# Patient Record
Sex: Female | Born: 1965 | Race: White | Hispanic: Yes | Marital: Married | Smoking: Never smoker
Health system: Southern US, Community
[De-identification: ages and names within clinical notes are randomized; demographics above are authoritative.]

## PROBLEM LIST (undated history)

## (undated) DIAGNOSIS — Z9889 Other specified postprocedural states: Secondary | ICD-10-CM

## (undated) DIAGNOSIS — H269 Unspecified cataract: Secondary | ICD-10-CM

## (undated) DIAGNOSIS — M869 Osteomyelitis, unspecified: Secondary | ICD-10-CM

## (undated) DIAGNOSIS — D649 Anemia, unspecified: Secondary | ICD-10-CM

## (undated) DIAGNOSIS — C52 Malignant neoplasm of vagina: Secondary | ICD-10-CM

## (undated) DIAGNOSIS — E119 Type 2 diabetes mellitus without complications: Secondary | ICD-10-CM

## (undated) DIAGNOSIS — E11621 Type 2 diabetes mellitus with foot ulcer: Secondary | ICD-10-CM

## (undated) DIAGNOSIS — L97509 Non-pressure chronic ulcer of other part of unspecified foot with unspecified severity: Secondary | ICD-10-CM

## (undated) DIAGNOSIS — R112 Nausea with vomiting, unspecified: Secondary | ICD-10-CM

## (undated) DIAGNOSIS — E1165 Type 2 diabetes mellitus with hyperglycemia: Secondary | ICD-10-CM

## (undated) DIAGNOSIS — IMO0002 Reserved for concepts with insufficient information to code with codable children: Secondary | ICD-10-CM

## (undated) HISTORY — DX: Osteomyelitis, unspecified: M86.9

## (undated) HISTORY — DX: Anemia, unspecified: D64.9

## (undated) HISTORY — DX: Type 2 diabetes mellitus with foot ulcer: L97.509

## (undated) HISTORY — DX: Unspecified cataract: H26.9

## (undated) HISTORY — DX: Type 2 diabetes mellitus with foot ulcer: E11.621

## (undated) HISTORY — PX: OTHER SURGICAL HISTORY: SHX169

## (undated) HISTORY — DX: Reserved for concepts with insufficient information to code with codable children: IMO0002

## (undated) HISTORY — PX: TUBAL LIGATION: SHX77

## (undated) HISTORY — DX: Malignant neoplasm of vagina: C52

## (undated) HISTORY — DX: Type 2 diabetes mellitus with hyperglycemia: E11.65

---

## 1998-09-04 ENCOUNTER — Other Ambulatory Visit: Admission: RE | Admit: 1998-09-04 | Discharge: 1998-09-04 | Payer: Self-pay | Admitting: Gynecology

## 2000-04-05 ENCOUNTER — Emergency Department (HOSPITAL_COMMUNITY): Admission: EM | Admit: 2000-04-05 | Discharge: 2000-04-05 | Payer: Self-pay | Admitting: Emergency Medicine

## 2000-07-31 ENCOUNTER — Emergency Department (HOSPITAL_COMMUNITY): Admission: EM | Admit: 2000-07-31 | Discharge: 2000-07-31 | Payer: Self-pay | Admitting: Emergency Medicine

## 2000-08-03 ENCOUNTER — Emergency Department (HOSPITAL_COMMUNITY): Admission: EM | Admit: 2000-08-03 | Discharge: 2000-08-03 | Payer: Self-pay | Admitting: Emergency Medicine

## 2000-08-03 ENCOUNTER — Encounter: Payer: Self-pay | Admitting: Emergency Medicine

## 2000-08-07 ENCOUNTER — Emergency Department (HOSPITAL_COMMUNITY): Admission: EM | Admit: 2000-08-07 | Discharge: 2000-08-07 | Payer: Self-pay | Admitting: Emergency Medicine

## 2000-08-30 ENCOUNTER — Emergency Department (HOSPITAL_COMMUNITY): Admission: EM | Admit: 2000-08-30 | Discharge: 2000-08-30 | Payer: Self-pay | Admitting: Emergency Medicine

## 2002-08-11 ENCOUNTER — Encounter: Payer: Self-pay | Admitting: Family Medicine

## 2002-08-11 ENCOUNTER — Ambulatory Visit (HOSPITAL_COMMUNITY): Admission: RE | Admit: 2002-08-11 | Discharge: 2002-08-11 | Payer: Self-pay | Admitting: Family Medicine

## 2003-01-24 ENCOUNTER — Encounter: Payer: Self-pay | Admitting: Emergency Medicine

## 2003-01-24 ENCOUNTER — Emergency Department (HOSPITAL_COMMUNITY): Admission: EM | Admit: 2003-01-24 | Discharge: 2003-01-24 | Payer: Self-pay | Admitting: Emergency Medicine

## 2003-07-01 HISTORY — PX: OTHER SURGICAL HISTORY: SHX169

## 2003-07-06 ENCOUNTER — Encounter: Payer: Self-pay | Admitting: Obstetrics and Gynecology

## 2003-07-06 ENCOUNTER — Ambulatory Visit (HOSPITAL_COMMUNITY): Admission: RE | Admit: 2003-07-06 | Discharge: 2003-07-06 | Payer: Self-pay | Admitting: Obstetrics and Gynecology

## 2003-07-19 ENCOUNTER — Ambulatory Visit (HOSPITAL_COMMUNITY): Admission: RE | Admit: 2003-07-19 | Discharge: 2003-07-19 | Payer: Self-pay | Admitting: Obstetrics and Gynecology

## 2003-07-19 ENCOUNTER — Encounter (INDEPENDENT_AMBULATORY_CARE_PROVIDER_SITE_OTHER): Payer: Self-pay | Admitting: *Deleted

## 2003-09-25 ENCOUNTER — Ambulatory Visit: Admission: RE | Admit: 2003-09-25 | Discharge: 2003-09-25 | Payer: Self-pay | Admitting: Gynecology

## 2003-10-01 HISTORY — PX: OTHER SURGICAL HISTORY: SHX169

## 2003-10-02 ENCOUNTER — Ambulatory Visit (HOSPITAL_COMMUNITY): Admission: RE | Admit: 2003-10-02 | Discharge: 2003-10-02 | Payer: Self-pay | Admitting: Gynecology

## 2003-10-02 ENCOUNTER — Encounter (INDEPENDENT_AMBULATORY_CARE_PROVIDER_SITE_OTHER): Payer: Self-pay

## 2003-11-13 ENCOUNTER — Ambulatory Visit: Admission: RE | Admit: 2003-11-13 | Discharge: 2003-11-13 | Payer: Self-pay | Admitting: Gynecology

## 2004-02-27 ENCOUNTER — Encounter (INDEPENDENT_AMBULATORY_CARE_PROVIDER_SITE_OTHER): Payer: Self-pay | Admitting: Specialist

## 2004-02-27 ENCOUNTER — Other Ambulatory Visit: Admission: RE | Admit: 2004-02-27 | Discharge: 2004-02-27 | Payer: Self-pay | Admitting: Gynecology

## 2004-02-27 ENCOUNTER — Ambulatory Visit: Admission: RE | Admit: 2004-02-27 | Discharge: 2004-02-27 | Payer: Self-pay | Admitting: Gynecology

## 2004-08-19 ENCOUNTER — Ambulatory Visit: Payer: Self-pay | Admitting: Family Medicine

## 2004-09-23 ENCOUNTER — Ambulatory Visit: Admission: RE | Admit: 2004-09-23 | Discharge: 2004-09-23 | Payer: Self-pay | Admitting: Gynecology

## 2004-11-06 ENCOUNTER — Ambulatory Visit: Payer: Self-pay | Admitting: Family Medicine

## 2004-11-28 ENCOUNTER — Ambulatory Visit: Payer: Self-pay | Admitting: *Deleted

## 2005-01-05 ENCOUNTER — Ambulatory Visit: Payer: Self-pay | Admitting: Family Medicine

## 2005-02-02 ENCOUNTER — Ambulatory Visit: Payer: Self-pay | Admitting: Family Medicine

## 2005-04-07 ENCOUNTER — Ambulatory Visit: Payer: Self-pay | Admitting: Family Medicine

## 2005-04-09 ENCOUNTER — Ambulatory Visit: Payer: Self-pay | Admitting: Family Medicine

## 2005-07-27 ENCOUNTER — Ambulatory Visit: Payer: Self-pay | Admitting: Family Medicine

## 2005-08-04 ENCOUNTER — Ambulatory Visit: Payer: Self-pay | Admitting: Family Medicine

## 2006-06-28 ENCOUNTER — Ambulatory Visit: Payer: Self-pay | Admitting: Internal Medicine

## 2008-06-07 ENCOUNTER — Encounter (INDEPENDENT_AMBULATORY_CARE_PROVIDER_SITE_OTHER): Payer: Self-pay | Admitting: Family Medicine

## 2009-03-24 ENCOUNTER — Emergency Department (HOSPITAL_COMMUNITY): Admission: EM | Admit: 2009-03-24 | Discharge: 2009-03-25 | Payer: Self-pay | Admitting: Emergency Medicine

## 2009-12-31 DIAGNOSIS — C52 Malignant neoplasm of vagina: Secondary | ICD-10-CM

## 2010-01-27 ENCOUNTER — Encounter (INDEPENDENT_AMBULATORY_CARE_PROVIDER_SITE_OTHER): Payer: Self-pay | Admitting: Internal Medicine

## 2010-01-27 ENCOUNTER — Inpatient Hospital Stay (HOSPITAL_COMMUNITY): Admission: EM | Admit: 2010-01-27 | Discharge: 2010-02-01 | Payer: Self-pay | Admitting: Emergency Medicine

## 2010-01-30 ENCOUNTER — Encounter: Payer: Self-pay | Admitting: Physician Assistant

## 2010-01-30 LAB — CONVERTED CEMR LAB: Cholesterol: 102 mg/dL

## 2010-02-03 ENCOUNTER — Ambulatory Visit: Admission: RE | Admit: 2010-02-03 | Discharge: 2010-02-24 | Payer: Self-pay | Admitting: Radiation Oncology

## 2010-02-10 ENCOUNTER — Telehealth: Payer: Self-pay | Admitting: Physician Assistant

## 2010-02-18 ENCOUNTER — Encounter: Payer: Self-pay | Admitting: Physician Assistant

## 2010-02-18 DIAGNOSIS — E119 Type 2 diabetes mellitus without complications: Secondary | ICD-10-CM

## 2010-02-18 DIAGNOSIS — D649 Anemia, unspecified: Secondary | ICD-10-CM

## 2010-02-25 ENCOUNTER — Ambulatory Visit: Payer: Self-pay | Admitting: Oncology

## 2010-03-11 ENCOUNTER — Ambulatory Visit (HOSPITAL_COMMUNITY): Admission: RE | Admit: 2010-03-11 | Discharge: 2010-03-11 | Payer: Self-pay | Admitting: Radiation Oncology

## 2010-03-18 ENCOUNTER — Ambulatory Visit (HOSPITAL_COMMUNITY): Admission: RE | Admit: 2010-03-18 | Discharge: 2010-03-18 | Payer: Self-pay | Admitting: Radiation Oncology

## 2010-04-03 ENCOUNTER — Ambulatory Visit: Payer: Self-pay | Admitting: Oncology

## 2010-04-11 LAB — CBC WITH DIFFERENTIAL/PLATELET
Basophils Absolute: 0 10*3/uL (ref 0.0–0.1)
EOS%: 3.9 % (ref 0.0–7.0)
HCT: 38.1 % (ref 34.8–46.6)
HGB: 12.9 g/dL (ref 11.6–15.9)
LYMPH%: 23.8 % (ref 14.0–49.7)
MCH: 29 pg (ref 25.1–34.0)
MCV: 85.9 fL (ref 79.5–101.0)
MONO%: 9.8 % (ref 0.0–14.0)
NEUT%: 62.1 % (ref 38.4–76.8)
Platelets: 351 10*3/uL (ref 145–400)
RDW: 19.5 % — ABNORMAL HIGH (ref 11.2–14.5)

## 2010-04-11 LAB — COMPREHENSIVE METABOLIC PANEL
AST: 19 U/L (ref 0–37)
Alkaline Phosphatase: 122 U/L — ABNORMAL HIGH (ref 39–117)
BUN: 17 mg/dL (ref 6–23)
Creatinine, Ser: 0.48 mg/dL (ref 0.40–1.20)
Glucose, Bld: 211 mg/dL — ABNORMAL HIGH (ref 70–99)
Total Bilirubin: 0.4 mg/dL (ref 0.3–1.2)

## 2010-05-07 ENCOUNTER — Other Ambulatory Visit: Admission: RE | Admit: 2010-05-07 | Discharge: 2010-05-07 | Payer: Self-pay | Admitting: Gynecologic Oncology

## 2010-05-07 ENCOUNTER — Ambulatory Visit: Admission: RE | Admit: 2010-05-07 | Discharge: 2010-05-07 | Payer: Self-pay | Admitting: Gynecologic Oncology

## 2010-05-16 ENCOUNTER — Ambulatory Visit: Payer: Self-pay | Admitting: Oncology

## 2010-05-19 ENCOUNTER — Ambulatory Visit: Admission: RE | Admit: 2010-05-19 | Discharge: 2010-06-04 | Payer: Self-pay | Admitting: Radiation Oncology

## 2010-05-19 ENCOUNTER — Encounter: Admission: RE | Admit: 2010-05-19 | Discharge: 2010-05-19 | Payer: Self-pay | Admitting: Emergency Medicine

## 2010-05-20 LAB — CBC WITH DIFFERENTIAL/PLATELET
Basophils Absolute: 0 10*3/uL (ref 0.0–0.1)
EOS%: 3.6 % (ref 0.0–7.0)
HGB: 13 g/dL (ref 11.6–15.9)
MCH: 30.3 pg (ref 25.1–34.0)
MCV: 87.7 fL (ref 79.5–101.0)
MONO%: 7.9 % (ref 0.0–14.0)
RBC: 4.29 10*6/uL (ref 3.70–5.45)
RDW: 13.7 % (ref 11.2–14.5)

## 2010-05-20 LAB — COMPREHENSIVE METABOLIC PANEL
AST: 15 U/L (ref 0–37)
Albumin: 3.8 g/dL (ref 3.5–5.2)
Alkaline Phosphatase: 141 U/L — ABNORMAL HIGH (ref 39–117)
BUN: 15 mg/dL (ref 6–23)
Potassium: 4 mEq/L (ref 3.5–5.3)
Total Bilirubin: 0.8 mg/dL (ref 0.3–1.2)

## 2010-05-30 LAB — COMPREHENSIVE METABOLIC PANEL
ALT: 17 U/L (ref 0–35)
Alkaline Phosphatase: 150 U/L — ABNORMAL HIGH (ref 39–117)
CO2: 25 mEq/L (ref 19–32)
Creatinine, Ser: 0.44 mg/dL (ref 0.40–1.20)
Total Bilirubin: 0.9 mg/dL (ref 0.3–1.2)

## 2010-05-30 LAB — CBC WITH DIFFERENTIAL/PLATELET
Eosinophils Absolute: 0.2 10*3/uL (ref 0.0–0.5)
HCT: 38.6 % (ref 34.8–46.6)
LYMPH%: 19.9 % (ref 14.0–49.7)
MCHC: 34.7 g/dL (ref 31.5–36.0)
MONO#: 0.4 10*3/uL (ref 0.1–0.9)
NEUT#: 3.7 10*3/uL (ref 1.5–6.5)
NEUT%: 69.5 % (ref 38.4–76.8)
Platelets: 259 10*3/uL (ref 145–400)
WBC: 5.3 10*3/uL (ref 3.9–10.3)

## 2010-06-06 LAB — CBC WITH DIFFERENTIAL/PLATELET
BASO%: 1 % (ref 0.0–2.0)
EOS%: 3.1 % (ref 0.0–7.0)
HCT: 39.4 % (ref 34.8–46.6)
LYMPH%: 22 % (ref 14.0–49.7)
MCH: 29.6 pg (ref 25.1–34.0)
MCHC: 34 g/dL (ref 31.5–36.0)
MCV: 87 fL (ref 79.5–101.0)
NEUT%: 66.3 % (ref 38.4–76.8)
Platelets: 240 10*3/uL (ref 145–400)

## 2010-07-02 ENCOUNTER — Ambulatory Visit: Payer: Self-pay | Admitting: Oncology

## 2010-07-04 LAB — COMPREHENSIVE METABOLIC PANEL
ALT: 14 U/L (ref 0–35)
AST: 13 U/L (ref 0–37)
Calcium: 9.8 mg/dL (ref 8.4–10.5)
Chloride: 99 mEq/L (ref 96–112)
Creatinine, Ser: 0.51 mg/dL (ref 0.40–1.20)
Potassium: 4 mEq/L (ref 3.5–5.3)
Sodium: 138 mEq/L (ref 135–145)
Total Protein: 7 g/dL (ref 6.0–8.3)

## 2010-07-04 LAB — CBC WITH DIFFERENTIAL/PLATELET
BASO%: 0.3 % (ref 0.0–2.0)
EOS%: 2 % (ref 0.0–7.0)
MCH: 31.4 pg (ref 25.1–34.0)
MCHC: 34.7 g/dL (ref 31.5–36.0)
NEUT%: 65.8 % (ref 38.4–76.8)
RBC: 4.02 10*6/uL (ref 3.70–5.45)
RDW: 13.2 % (ref 11.2–14.5)
WBC: 4.6 10*3/uL (ref 3.9–10.3)
lymph#: 1.1 10*3/uL (ref 0.9–3.3)

## 2010-07-21 ENCOUNTER — Encounter: Payer: Self-pay | Admitting: Physician Assistant

## 2010-08-07 ENCOUNTER — Ambulatory Visit: Admission: RE | Admit: 2010-08-07 | Discharge: 2010-08-07 | Payer: Self-pay | Admitting: Gynecologic Oncology

## 2010-10-01 ENCOUNTER — Ambulatory Visit: Payer: Self-pay | Admitting: Oncology

## 2010-10-03 LAB — CBC WITH DIFFERENTIAL/PLATELET
Basophils Absolute: 0 10*3/uL (ref 0.0–0.1)
EOS%: 3 % (ref 0.0–7.0)
Eosinophils Absolute: 0.1 10*3/uL (ref 0.0–0.5)
HCT: 38.4 % (ref 34.8–46.6)
HGB: 13.3 g/dL (ref 11.6–15.9)
MCH: 30.9 pg (ref 25.1–34.0)
MCV: 89.5 fL (ref 79.5–101.0)
MONO%: 6.3 % (ref 0.0–14.0)
NEUT#: 2.7 10*3/uL (ref 1.5–6.5)
NEUT%: 62.4 % (ref 38.4–76.8)
lymph#: 1.2 10*3/uL (ref 0.9–3.3)

## 2010-10-03 LAB — COMPREHENSIVE METABOLIC PANEL
AST: 12 U/L (ref 0–37)
Albumin: 4.6 g/dL (ref 3.5–5.2)
BUN: 14 mg/dL (ref 6–23)
Calcium: 10.4 mg/dL (ref 8.4–10.5)
Chloride: 94 mEq/L — ABNORMAL LOW (ref 96–112)
Creatinine, Ser: 0.62 mg/dL (ref 0.40–1.20)
Glucose, Bld: 421 mg/dL — ABNORMAL HIGH (ref 70–99)
Potassium: 4.3 mEq/L (ref 3.5–5.3)

## 2010-10-30 ENCOUNTER — Ambulatory Visit: Admission: RE | Admit: 2010-10-30 | Payer: Self-pay | Admitting: Gynecologic Oncology

## 2010-11-13 ENCOUNTER — Ambulatory Visit
Admission: RE | Admit: 2010-11-13 | Discharge: 2010-11-13 | Payer: Self-pay | Source: Home / Self Care | Attending: Gynecologic Oncology | Admitting: Gynecologic Oncology

## 2010-11-13 ENCOUNTER — Other Ambulatory Visit
Admission: RE | Admit: 2010-11-13 | Discharge: 2010-11-13 | Payer: Self-pay | Source: Home / Self Care | Admitting: Gynecologic Oncology

## 2010-12-14 ENCOUNTER — Inpatient Hospital Stay (HOSPITAL_COMMUNITY)
Admission: EM | Admit: 2010-12-14 | Discharge: 2010-12-19 | Payer: Self-pay | Source: Home / Self Care | Attending: Internal Medicine | Admitting: Internal Medicine

## 2010-12-15 ENCOUNTER — Encounter (INDEPENDENT_AMBULATORY_CARE_PROVIDER_SITE_OTHER): Payer: Self-pay | Admitting: Internal Medicine

## 2010-12-15 LAB — PREGNANCY, URINE: Preg Test, Ur: NEGATIVE

## 2010-12-15 LAB — POCT I-STAT, CHEM 8
BUN: 5 mg/dL — ABNORMAL LOW (ref 6–23)
Calcium, Ion: 1.21 mmol/L (ref 1.12–1.32)
Chloride: 105 mEq/L (ref 96–112)
Creatinine, Ser: 0.5 mg/dL (ref 0.4–1.2)
Glucose, Bld: 310 mg/dL — ABNORMAL HIGH (ref 70–99)
HCT: 39 % (ref 36.0–46.0)
Hemoglobin: 13.3 g/dL (ref 12.0–15.0)
Potassium: 3.4 mEq/L — ABNORMAL LOW (ref 3.5–5.1)
Sodium: 135 mEq/L (ref 135–145)
TCO2: 13 mmol/L (ref 0–100)

## 2010-12-15 LAB — COMPREHENSIVE METABOLIC PANEL
ALT: 10 U/L (ref 0–35)
AST: 10 U/L (ref 0–37)
Albumin: 2.4 g/dL — ABNORMAL LOW (ref 3.5–5.2)
Alkaline Phosphatase: 110 U/L (ref 39–117)
BUN: 6 mg/dL (ref 6–23)
CO2: 19 mEq/L (ref 19–32)
Calcium: 8.4 mg/dL (ref 8.4–10.5)
Chloride: 104 mEq/L (ref 96–112)
Creatinine, Ser: 0.58 mg/dL (ref 0.4–1.2)
GFR calc Af Amer: >60 mL/min (ref >60)
GFR calc non Af Amer: >60 mL/min (ref >60)
Glucose, Bld: 235 mg/dL — ABNORMAL HIGH (ref 70–99)
Potassium: 3.3 mEq/L — ABNORMAL LOW (ref 3.5–5.1)
Sodium: 135 mEq/L (ref 135–145)
Total Bilirubin: 0.9 mg/dL (ref 0.3–1.2)
Total Protein: 7.1 g/dL (ref 6.0–8.3)

## 2010-12-15 LAB — CBC
HCT: 35.4 % — ABNORMAL LOW (ref 36.0–46.0)
Hemoglobin: 12 g/dL (ref 12.0–15.0)
MCH: 30.1 pg (ref 26.0–34.0)
MCHC: 33.9 g/dL (ref 30.0–36.0)
MCV: 88.7 fL (ref 78.0–100.0)
Platelets: 513 10*3/uL — ABNORMAL HIGH (ref 150–400)
RBC: 3.99 MIL/uL (ref 3.87–5.11)
RDW: 12.7 % (ref 11.5–15.5)
WBC: 15.5 10*3/uL — ABNORMAL HIGH (ref 4.0–10.5)

## 2010-12-15 LAB — GLUCOSE, CAPILLARY
Glucose-Capillary: 291 mg/dL — ABNORMAL HIGH (ref 70–99)
Glucose-Capillary: 240 mg/dL — ABNORMAL HIGH (ref 70–99)
Glucose-Capillary: 231 mg/dL — ABNORMAL HIGH (ref 70–99)
Glucose-Capillary: 327 mg/dL — ABNORMAL HIGH (ref 70–99)
Glucose-Capillary: 246 mg/dL — ABNORMAL HIGH (ref 70–99)
Glucose-Capillary: 325 mg/dL — ABNORMAL HIGH (ref 70–99)

## 2010-12-15 LAB — DIFFERENTIAL
Basophils Absolute: 0 10*3/uL (ref 0.0–0.1)
Basophils Relative: 0 % (ref 0–1)
Eosinophils Absolute: 0 10*3/uL (ref 0.0–0.7)
Eosinophils Relative: 0 % (ref 0–5)
Lymphocytes Relative: 9 % — ABNORMAL LOW (ref 12–46)
Lymphs Abs: 1.4 10*3/uL (ref 0.7–4.0)
Monocytes Absolute: 0.7 10*3/uL (ref 0.1–1.0)
Monocytes Relative: 5 % (ref 3–12)
Neutro Abs: 13.3 10*3/uL — ABNORMAL HIGH (ref 1.7–7.7)
Neutrophils Relative %: 86 % — ABNORMAL HIGH (ref 43–77)

## 2010-12-15 LAB — HEMOGLOBIN A1C
Hgb A1c MFr Bld: 13.2 % — ABNORMAL HIGH (ref <5.7)
Mean Plasma Glucose: 332 mg/dL — ABNORMAL HIGH (ref <117)

## 2010-12-15 LAB — URINE MICROSCOPIC-ADD ON

## 2010-12-15 LAB — URINALYSIS, ROUTINE W REFLEX MICROSCOPIC
Bilirubin Urine: NEGATIVE
Ketones, ur: >80 mg/dL — AB
Nitrite: NEGATIVE
Protein, ur: 100 mg/dL — AB
Specific Gravity, Urine: 1.022 (ref 1.005–1.030)
Urine Glucose, Fasting: >1000 mg/dL — AB
Urobilinogen, UA: 0.2 mg/dL (ref 0.0–1.0)
pH: 5.5 (ref 5.0–8.0)

## 2010-12-15 LAB — C-REACTIVE PROTEIN: CRP: 23.8 mg/dL — ABNORMAL HIGH (ref <0.6)

## 2010-12-15 LAB — SEDIMENTATION RATE: Sed Rate: 116 mm/hr — ABNORMAL HIGH (ref 0–22)

## 2010-12-17 ENCOUNTER — Encounter (INDEPENDENT_AMBULATORY_CARE_PROVIDER_SITE_OTHER): Payer: Self-pay | Admitting: Internal Medicine

## 2010-12-17 LAB — GLUCOSE, CAPILLARY
Glucose-Capillary: 262 mg/dL — ABNORMAL HIGH (ref 70–99)
Glucose-Capillary: 157 mg/dL — ABNORMAL HIGH (ref 70–99)
Glucose-Capillary: 259 mg/dL — ABNORMAL HIGH (ref 70–99)
Glucose-Capillary: 237 mg/dL — ABNORMAL HIGH (ref 70–99)
Glucose-Capillary: 303 mg/dL — ABNORMAL HIGH (ref 70–99)
Glucose-Capillary: 162 mg/dL — ABNORMAL HIGH (ref 70–99)
Glucose-Capillary: 149 mg/dL — ABNORMAL HIGH (ref 70–99)
Glucose-Capillary: 130 mg/dL — ABNORMAL HIGH (ref 70–99)

## 2010-12-17 LAB — CBC
HCT: 27.1 % — ABNORMAL LOW (ref 36.0–46.0)
HCT: 27.9 % — ABNORMAL LOW (ref 36.0–46.0)
Hemoglobin: 9.2 g/dL — ABNORMAL LOW (ref 12.0–15.0)
Hemoglobin: 9.4 g/dL — ABNORMAL LOW (ref 12.0–15.0)
MCH: 29.6 pg (ref 26.0–34.0)
MCH: 29.5 pg (ref 26.0–34.0)
MCHC: 33.9 g/dL (ref 30.0–36.0)
MCHC: 33.7 g/dL (ref 30.0–36.0)
MCV: 87.1 fL (ref 78.0–100.0)
MCV: 87.5 fL (ref 78.0–100.0)
Platelets: 401 10*3/uL — ABNORMAL HIGH (ref 150–400)
Platelets: 446 10*3/uL — ABNORMAL HIGH (ref 150–400)
RBC: 3.11 MIL/uL — ABNORMAL LOW (ref 3.87–5.11)
RBC: 3.19 MIL/uL — ABNORMAL LOW (ref 3.87–5.11)
RDW: 12.9 % (ref 11.5–15.5)
RDW: 12.9 % (ref 11.5–15.5)
WBC: 9.2 10*3/uL (ref 4.0–10.5)
WBC: 9 10*3/uL (ref 4.0–10.5)

## 2010-12-17 LAB — BASIC METABOLIC PANEL
BUN: 4 mg/dL — ABNORMAL LOW (ref 6–23)
CO2: 22 mEq/L (ref 19–32)
Calcium: 8.4 mg/dL (ref 8.4–10.5)
Chloride: 108 mEq/L (ref 96–112)
Creatinine, Ser: 0.62 mg/dL (ref 0.4–1.2)
GFR calc Af Amer: >60 mL/min (ref >60)
GFR calc non Af Amer: >60 mL/min (ref >60)
Glucose, Bld: 254 mg/dL — ABNORMAL HIGH (ref 70–99)
Potassium: 3.9 mEq/L (ref 3.5–5.1)
Sodium: 138 mEq/L (ref 135–145)

## 2010-12-17 LAB — DIFFERENTIAL
Basophils Absolute: 0 10*3/uL (ref 0.0–0.1)
Basophils Relative: 0 % (ref 0–1)
Eosinophils Absolute: 0.1 10*3/uL (ref 0.0–0.7)
Eosinophils Relative: 1 % (ref 0–5)
Lymphocytes Relative: 12 % (ref 12–46)
Lymphs Abs: 1.1 10*3/uL (ref 0.7–4.0)
Monocytes Absolute: 0.7 10*3/uL (ref 0.1–1.0)
Monocytes Relative: 8 % (ref 3–12)
Neutro Abs: 7.3 10*3/uL (ref 1.7–7.7)
Neutrophils Relative %: 79 % — ABNORMAL HIGH (ref 43–77)

## 2010-12-17 LAB — URINE CULTURE
Colony Count: 15000
Culture  Setup Time: 201201151146
Special Requests: NEGATIVE

## 2010-12-17 LAB — VANCOMYCIN, TROUGH: Vancomycin Tr: 5.5 ug/mL — ABNORMAL LOW (ref 10.0–20.0)

## 2010-12-17 LAB — TSH: TSH: 0.654 u[IU]/mL (ref 0.350–4.500)

## 2010-12-18 ENCOUNTER — Other Ambulatory Visit: Payer: Self-pay | Admitting: Radiation Oncology

## 2010-12-18 DIAGNOSIS — C52 Malignant neoplasm of vagina: Secondary | ICD-10-CM

## 2010-12-19 HISTORY — PX: OTHER SURGICAL HISTORY: SHX169

## 2010-12-19 NOTE — Op Note (Signed)
[name], [name]                 ACCOUNT NO.:  [id number]  MEDICAL RECORD NO.:  [id number]          PATIENT TYPE:  INP  LOCATION:  1535                         FACILITY:  [facility]  PHYSICIAN:  Feliberto Gottron. Turner Daniels, M.D.   DATE OF BIRTH:  June 17, 1966  DATE OF PROCEDURE:  12/17/2010 DATE OF DISCHARGE:                              OPERATIVE REPORT   PREOPERATIVE DIAGNOSIS:  Left second toe diabetic infection and necrosis.  POSTOPERATIVE DIAGNOSIS:  Left second toe diabetic infection and necrosis.  PROCEDURE:  Left second toe MTP joint amputation.  SURGEON:  Feliberto Gottron. Turner Daniels, MD  FIRST ASSISTANT:  Shirl Harris, PA  ANESTHETIC:  General LMA.  ESTIMATED BLOOD LOSS:  20 mL.  FLUID REPLACEMENT:  800 mL of crystalloid.  DRAINS PLACED:  None.  TOURNIQUET TIME:  None.  INDICATIONS FOR PROCEDURE:  The patient is a 45 year old woman admitted to the Medicine Service with cellulitis of both feet and early diabetic ketoacidosis.  A few days before this procedure, we were consulted because of infection and necrosis of the left second toe.  MRI scan was consistent with osteomyelitis of the proximal middle phalanges.  She also had skin necrosis and exposed tendon after a debridement at the bedside 2 days after her admission.  This was pretty much demarcated out to the level of the MTP joint and was consistent with the MRI scan. After her diabetes was brought under control and her white count normalized, she was prepared for left second toe MTP joint amputation. The toe itself was black at the tip and obviously necrotic.  Risks and benefits of surgery were discussed, and questions were answered.  DESCRIPTION OF THE PROCEDURE:  The patient identified by armband and taken to the operating room at [facility]. Appropriate anesthetic monitors were attached.  General LMA anesthesia induced with the patient in supine position.  Tourniquet was applied to the left ankle, but  never used.  The left foot was then prepped and draped in usual sterile fashion from the toes to the tourniquet.  I began the operation by making a fishmouth incision allowing the MTP joint amputation of the second toe.  It was not until we got down fairly deep, encountered any significant bleeding, and these were cauterized with the cautery.  We then cut the ligaments around the MTP joint performing the MTP joint amputation.  The second toe was sent to the lab as a specimen.  We did find some bits of necrotic material around to the metatarsal head.  These were removed sharply with the knife and rongeurs back to a healthy-appearing tissue.  We then irrigated out alternating between normal saline solution and hydrogen peroxide and satisfied with the debridement, loosely closed the fishmouth incision with a couple of 3-0 nylon horizontal mattress sutures.  We also placed an Iodoform wick in the wound as a part of the drain.  A dressing of 4 x 4's, Kerlix, and an Ace wrap was then applied.  The patient was awakened and taken to the recovery room without difficulty.     Feliberto Gottron. Turner Daniels, M.D.  FJR/MEDQ  D:  12/18/2010  T:  12/19/2010  Job:  [id number]  Electronically Signed by Gean Birchwood M.D. on 12/19/2010 10:47:18 PM

## 2010-12-20 NOTE — H&P (Signed)
[name], [name]                 ACCOUNT NO.:  [id number]  MEDICAL RECORD NO.:  [id number]          PATIENT TYPE:  INP  LOCATION:  0101                         FACILITY:  [facility]  PHYSICIAN:  Hilary Hertz, MD      DATE OF BIRTH:  1966/11/24  DATE OF ADMISSION:  12/14/2010 DATE OF DISCHARGE:                             HISTORY & PHYSICAL   PRIMARY CARE PHYSICIAN:  None.  CHIEF COMPLAINT:  Left foot pain.  HISTORY OF PRESENT ILLNESS:   ALL HISTORY OBTAINED VIA SPANISH TRANSLATOR  The patient is a 46 year old woman with untreated diabetes, vulvar cancer, and squamous cell carcinoma of the cervix, stage 4 vaginal cancer who presents with a 1-week history of left foot pain.  The patient reports that for about a week she has had pain in her left foot and some chills and nausea.  She presents today for evaluation of the symptoms.  The patient has also noted a wound on her left toe that has been progressively getting worse over the past week.  She denies any recent injury to the foot.  She reports that 2 years ago she did have a foot injury, but nothing recent.  She has no other acute complaints.    Review of 10 organ systems was done and is negative except as stated above in the HPI  ALLERGIES:  No known drug allergies.  MEDICATIONS:  The patient reports she was taking amoxicillin as needed that she brought at the pharmacy.  She was not prescribed this medication.  She is not on any medications for diabetes due to cost concerns.  PAST MEDICAL HISTORY: 1. Vulvar cancer is 2005. 2. Stage 4 vaginal cancer. 3. 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 4. Anemia. 5. Diabetes, currently not on any medications due to cost.  SOCIAL HISTORY:  Nonsmoker.  No alcohol.  FAMILY HISTORY:  No family history of CAD or diabetes.  PHYSICAL EXAM:  VITALS:  A 104/69, 115, 18, 98.6, and 96% on room air. GENERAL:  No acute distress. HEENT:  Mucous  membranes moist.  Sclerae anicteric. CV:  Tachycardic.  No murmurs. LUNGS:  Clear to auscultation bilaterally.  Nonlabored respiratory effort. ABDOMEN:  Soft, nontender, and nondistended. EXTREMITIES:  Left lower extremity shows diffuse erythema and warmth of her left foot extending up to the knee with an ulceration on the anterior portion of her left second toe with scaling skin and necrotic skin.  The patient has good sensation to the area.  The foot is painful to touch.  In the right foot second toe, there is also some skin breakdown and redness, but it is much less so than on the left side. There is no pitting edema.  LABORATORY DATA:  White count 15.5, hemoglobin 12, and platelets 513. Sodium 135, potassium 3.4, chloride 105, bicarbonate 13, BUN 5, creatinine 0.5, and glucose 310.  UA; positive blood, negative nitrites, positive leukocyte esterase, positive glucose, 7 to 10 WBCs, rare bacteria, and 11 to 20 RBCs.  Foot x-rays bilaterally showed no signs of osteomyelitis.  ASSESSMENT AND  PLAN:  The patient is a 45 year old woman with history of untreated diabetes, history of vulvar cancer, and stage 4 vaginal cancer who presents with left foot pain and ulceration. 1. Cellulitis and ulcerations bilateral feet.  At this time this is     concerning for more than just a typical cellulitis given the skin     changes.  Although the plain films were negative for osteomyelitis,     this is not the diagnostic test for osteomyelitis.  We will check a     sed rate and CRP and if these are elevated we will consider getting     an MRI of the patient's feet bilaterally.  She got Levaquin in the     ED.  We will put the patient on vancomycin and Zosyn, pharmacy to     dose.  We will also contact surgery in the morning as it is     unlikely that these ulcerations will be treated just with     antibiotics.  We will check blood cultures to rule out systemic     infections.  Will get wound care  consult.  Consider ID consult. 2. Dirty UA concerning for infection.  We will check a urine culture.     The patient will be on vancomycin and Zosyn as above. 3. Diabetes.  This is currently untreated due to the patient's     inability to afford medications.  We will check a hemoglobin A1c.     We will put the patient on sliding scale insulin.  We will ask     diabetes educator and talk to the patient. 4. Vulvar and vaginal cancers in remission per recent documentation. 5. Fluids, electrolytes, and nutrition.  We will place the patient on     maintenance IV fluids, replace electrolytes, and put her on a     diabetic diet. 6. Prophylaxis.  Lovenox for DVT prophylaxis. 7. Microscopic hematuria.  We will need to monitor and consider     outpatient workup given the fact on the patient's PET scan from     April 2011 showed circumferential bladder wall thickening.  DISPOSITION:  The patient is a full code.  She will be admitted for further workup.          ______________________________ Hilary Hertz, MD     JF/MEDQ  D:  12/14/2010  T:  12/14/2010  Job:  [id number]  Electronically Signed by Hilary Hertz MD on 12/20/2010 07:43:10 PM

## 2010-12-21 ENCOUNTER — Encounter: Payer: Self-pay | Admitting: Internal Medicine

## 2010-12-22 LAB — GLUCOSE, CAPILLARY
Glucose-Capillary: 130 mg/dL — ABNORMAL HIGH (ref 70–99)
Glucose-Capillary: 162 mg/dL — ABNORMAL HIGH (ref 70–99)
Glucose-Capillary: 149 mg/dL — ABNORMAL HIGH (ref 70–99)
Glucose-Capillary: 235 mg/dL — ABNORMAL HIGH (ref 70–99)
Glucose-Capillary: 171 mg/dL — ABNORMAL HIGH (ref 70–99)
Glucose-Capillary: 106 mg/dL — ABNORMAL HIGH (ref 70–99)

## 2010-12-22 LAB — CBC
HCT: 27.1 % — ABNORMAL LOW (ref 36.0–46.0)
Hemoglobin: 9.1 g/dL — ABNORMAL LOW (ref 12.0–15.0)
Hemoglobin: 9 g/dL — ABNORMAL LOW (ref 12.0–15.0)
MCH: 29.5 pg (ref 26.0–34.0)
MCH: 29.7 pg (ref 26.0–34.0)
MCHC: 33.6 g/dL (ref 30.0–36.0)
MCHC: 33 g/dL (ref 30.0–36.0)
MCV: 88 fL (ref 78.0–100.0)
MCV: 90.1 fL (ref 78.0–100.0)
MCV: 89.1 fL (ref 78.0–100.0)
Platelets: 431 10*3/uL — ABNORMAL HIGH (ref 150–400)
Platelets: 521 10*3/uL — ABNORMAL HIGH (ref 150–400)
RBC: 3.08 MIL/uL — ABNORMAL LOW (ref 3.87–5.11)
RBC: 3.03 MIL/uL — ABNORMAL LOW (ref 3.87–5.11)
RDW: 13.1 % (ref 11.5–15.5)
RDW: 13.4 % (ref 11.5–15.5)
WBC: 8.6 10*3/uL (ref 4.0–10.5)
WBC: 5.8 10*3/uL (ref 4.0–10.5)

## 2010-12-22 LAB — BASIC METABOLIC PANEL
BUN: 2 mg/dL — ABNORMAL LOW (ref 6–23)
BUN: 1 mg/dL — ABNORMAL LOW (ref 6–23)
CO2: 30 mEq/L (ref 19–32)
CO2: 30 mEq/L (ref 19–32)
Calcium: 8.4 mg/dL (ref 8.4–10.5)
Calcium: 8.5 mg/dL (ref 8.4–10.5)
Chloride: 106 mEq/L (ref 96–112)
Chloride: 108 mEq/L (ref 96–112)
Creatinine, Ser: 0.56 mg/dL (ref 0.4–1.2)
Creatinine, Ser: 0.56 mg/dL (ref 0.4–1.2)
GFR calc Af Amer: >60 mL/min (ref >60)
GFR calc Af Amer: >60 mL/min (ref >60)
GFR calc non Af Amer: >60 mL/min (ref >60)
GFR calc non Af Amer: >60 mL/min (ref >60)
Glucose, Bld: 123 mg/dL — ABNORMAL HIGH (ref 70–99)
Glucose, Bld: 209 mg/dL — ABNORMAL HIGH (ref 70–99)
Potassium: 2.7 mEq/L — CL (ref 3.5–5.1)
Potassium: 4.1 mEq/L (ref 3.5–5.1)
Sodium: 144 mEq/L (ref 135–145)
Sodium: 141 mEq/L (ref 135–145)

## 2010-12-22 LAB — DIFFERENTIAL
Basophils Absolute: 0 10*3/uL (ref 0.0–0.1)
Basophils Relative: 0 % (ref 0–1)
Eosinophils Absolute: 0.2 10*3/uL (ref 0.0–0.7)
Eosinophils Absolute: 0.3 10*3/uL (ref 0.0–0.7)
Eosinophils Relative: 2 % (ref 0–5)
Lymphocytes Relative: 19 % (ref 12–46)
Lymphs Abs: 1.6 10*3/uL (ref 0.7–4.0)
Lymphs Abs: 1.8 10*3/uL (ref 0.7–4.0)
Monocytes Absolute: 0.8 10*3/uL (ref 0.1–1.0)
Monocytes Absolute: 0.8 10*3/uL (ref 0.1–1.0)
Monocytes Relative: 10 % (ref 3–12)
Monocytes Relative: 12 % (ref 3–12)
Neutro Abs: 5.9 10*3/uL (ref 1.7–7.7)
Neutro Abs: 4.2 10*3/uL (ref 1.7–7.7)
Neutrophils Relative %: 69 % (ref 43–77)
Neutrophils Relative %: 59 % (ref 43–77)

## 2010-12-22 LAB — IRON AND TIBC
Saturation Ratios: 10 % — ABNORMAL LOW (ref 20–55)
TIBC: 148 ug/dL — ABNORMAL LOW (ref 250–470)
UIBC: 133 ug/dL

## 2010-12-22 LAB — FERRITIN: Ferritin: 348 ng/mL — ABNORMAL HIGH (ref 10–291)

## 2010-12-22 LAB — FOLATE: Folate: 12.3 ng/mL

## 2010-12-22 LAB — POTASSIUM
Potassium: 3.4 mEq/L — ABNORMAL LOW (ref 3.5–5.1)
Potassium: 3.3 mEq/L — ABNORMAL LOW (ref 3.5–5.1)

## 2010-12-22 LAB — SURGICAL PCR SCREEN
MRSA, PCR: NEGATIVE
Staphylococcus aureus: NEGATIVE

## 2010-12-22 LAB — MAGNESIUM: Magnesium: 2.1 mg/dL (ref 1.5–2.5)

## 2010-12-22 LAB — VANCOMYCIN, TROUGH: Vancomycin Tr: 16.8 ug/mL (ref 10.0–20.0)

## 2010-12-22 LAB — VITAMIN B12: Vitamin B-12: 1392 pg/mL — ABNORMAL HIGH (ref 211–911)

## 2010-12-23 LAB — CULTURE, BLOOD (ROUTINE X 2)
Culture  Setup Time: 201201151140
Culture  Setup Time: 201201151140
Culture: NO GROWTH
Culture: NO GROWTH

## 2010-12-26 NOTE — Discharge Summary (Signed)
NAMEGWYNNE, [name]                 ACCOUNT NO.:  [id number]  MEDICAL RECORD NO.:  [id number]          PATIENT TYPE:  INP  LOCATION:  1535                         FACILITY:  [facility]  PHYSICIAN:  Hartley Barefoot, MD    DATE OF BIRTH:  [birth year]  DATE OF ADMISSION:  12/14/2010 DATE OF DISCHARGE:  12/19/2010                              DISCHARGE SUMMARY   DISCHARGE DIAGNOSES: 1. Left second toe diabetic infection and necrosis, status post left     second toe metatarsophalangeal joint amputation. 2. Diabetes, uncontrolled. 3. Anemia of chronic disease.  OTHER PAST MEDICAL HISTORY: 1. History of vulvar cancer in 2005. 2. History of squamous cell carcinoma of the cervix in 2011 and status     post chemo and radiation.  DISCHARGE MEDICATIONS: 1. Bactrim DS 2 tablets by mouth twice daily for 45 days. 2. Ciprofloxacin 500 mg p.o. b.i.d. for 45 days. 3. Hydrocodone/Tylenol 1-2 tablets by mouth every 4 hours as needed     for pain. 4. Lantus 15 units subcutaneous daily at bedtime. 5. Metformin 500 mg 1 tablet by mouth twice daily.  DISPOSITION AND FOLLOWUP:  Mr. [name] will need to follow up with Dr. Turner Townsend on January 26 at 11:30.  She will need also to follow up with Dr. Daiva Townsend on January 26, 2011, at 2 p.m., and at the [facility] on March 3 at 2:30 p.m..  We will need to control her blood sugar.  Wound care was explained to the patient, and she will need to wash the foot with peroxide twice a day, and she will need to follow with her orthopedic doctor.  RADIOGRAPHIC STUDIES: 1. The left foot x-ray showed ulcers and demineralization. 2. On the right, showed no ulcers or demineralization. 3. MRI of the left foot showed cellulitis and myositis of the foot     without abscess.  Findings consistent with osteomyelitis seen in     the head of the second metatarsal and proximal middle phalanx of     the second toe.  Mild degree of signal changes, which could be due     to osteomyelitis  as identified in the proximal phalanx of the third     toe.  HISTORY OF PRESENT ILLNESS:  This is a 45 year old with past medical history of diabetes, uncontrolled, not on medication who presents complaining of a 1-week history of left foot pain.  She relayed that for about a week she has had left foot pain and had some chills and nausea. She also has another wound on her left second toe that has been progressively getting worse over the past week.  HOSPITAL COURSE: 1. Cellulitis of the left foot and left second toe diabetic infection     and necrosis status post amputation.  The patient was admitted to     regular floor.  Orthopedics was consulted.  The patient had     amputation of the second toe MTP joint on December 17, 2010.  ID was     consulted, and Dr. Daiva Townsend recommended initially vancomycin IV     inpatient with ciprofloxacin p.o., and he recommended  for     outpatient Bactrim DS and ciprofloxacin for a total of 6 weeks.     The patient will need to follow with her orthopedist on January 26. 2. Diabetes, uncontrolled.  Hemoglobin A1c at 13.  The patient was     started on Lantus 15 units subcutaneous.  I will discharge her on     metformin 500 mg p.o. b.i.d, and lantus.  We will need to increase her     metformin on her next office followup.  She might benefit also from     a sliding scale insulin. 3. Anemia.  The patient with anemia with hemoglobin at 9.4.  Anemia     panel showed iron of 15, total iron binding capacity 148, ferritin     348.  This could be anemia of chronic disease.  If hemoglobin does     not improve, consider a trial for iron to treat for iron deficiency anemia.  CONDITION ON DISCHARGE:  On the day of discharge, the patient was in good condition, pain was well-controlled.  Blood pressure was 114/71, saturating 97% on room air, pulse 75, respirations 16, and temperature 98.5.  Sodium 141, potassium 4.1, chloride 106, bicarbonate 28, glucose 209, BUN 3,  creatinine 0.43, white blood cell 5.8, hemoglobin 9.4, platelets 521.  HIV was negative.  Repeated blood sugar was 106.  The patient was discharged in a stable condition.     Hartley Barefoot, MD     BR/MEDQ  D:  12/19/2010  T:  12/19/2010  Job:  [id number]  Electronically Signed by Hartley Barefoot MD on 12/24/2010 01:52:43 PM

## 2010-12-30 NOTE — Letter (Signed)
Summary: NUTRITION & DIABETES//NO SHOWED  NUTRITION & DIABETES//NO SHOWED   Imported By: Arta Bruce 08/11/2010 08:52:26  _____________________________________________________________________  External Attachment:    Type:   Image     Comment:   External Document

## 2010-12-30 NOTE — Progress Notes (Signed)
  Phone Note From Other Clinic   Summary of Call: Mary from Dr. Luciano Cutter office called because pt was discharged from hospital and recieved insulin but they did not give pt any needles of syringes.... Corrie Dandy was wondering is there a way since pt doesn't have any insurance will we be able to get her some since the hospital discharge pt with out any..... Initial call taken by: [place] Shannon,  February 10, 2010 2:56 PM  Follow-up for Phone Call        New patient.  Appt 3/22. Ok to give Rx for insulin syringes. Rx on your desk to fax Follow-up by: Brynda Rim,  February 10, 2010 5:50 PM  Additional Follow-up for Phone Call Additional follow up Details #1::        left msg for pt to call back to see which pharmacy she wants rx sent to. via Gerilyn Nestle Additional Follow-up by: Vesta Mixer CMA,  February 12, 2010 11:25 AM    Additional Follow-up for Phone Call Additional follow up Details #2::    Left message on answering machine for pt to call back.Marland KitchenMarland KitchenMarland KitchenArmenia Shannon  February 12, 2010 4:21 PM  Left message on answering machine for pt to call back.[place] Shannon  February 14, 2010 12:41 PM   pt is aware.... Arm[place] Shannon  February 17, 2010 4:43 PM    New/Updated Medications: * INSULIN SYRINGES Inject Lantus once daily and Novolog three times a day as directed Prescriptions: INSULIN SYRINGES Inject Lantus once daily and Novolog three times a day as directed  #1 mo supply x 11   Entered and Authorized by:   Tereso Newcomer PA-C   Signed by:   Tereso Newcomer PA-C on 02/10/2010   Method used:   Printed then faxed to ...         RxID:   [id number]

## 2010-12-31 ENCOUNTER — Encounter (HOSPITAL_BASED_OUTPATIENT_CLINIC_OR_DEPARTMENT_OTHER): Payer: Self-pay | Attending: Physician Assistant

## 2011-01-03 NOTE — Consult Note (Signed)
NAMETIERRA, THOMA                 ACCOUNT NO.:  [id number]  MEDICAL RECORD NO.:  [id number]          PATIENT TYPE:  INP  LOCATION:  1535                         FACILITY:  [facility]  PHYSICIAN:  Acey Lav, MD  DATE OF BIRTH:  August 11, 1966  DATE OF CONSULTATION: DATE OF DISCHARGE:  08/07/2010                                CONSULTATION   REQUESTING PHYSICIAN:  Marcellus Scott, MD  REASON FOR INFECTIOUS DISEASE CONSULTATION:  Patient with necrotic toe with osteomyelitis and myositis of the foot, status post amputation by Orthopedic Surgery.  HISTORY OF PRESENT ILLNESS:  Ms. [name] is a 45 year old [place] lady with a past medical history significant for diabetes mellitus, stage II vulvar cancer and stage IV vaginal cancer, status post chemotherapy and radiation, who presented to the emergency department on the 15th of January with severe left-sided foot pain.  She had apparently cut her foot on the left toe approximately a week prior to admission and then developed fevers, chills, nausea, and increased drainage from the foot. On admission, she had erythema extending up to the knee and also the anterior portion of the left second toe with scaling and necrotic skin. She was admitted to the hospital service and started on vancomycin and Zosyn.  Her sed rate was checked and it was well over 100.  She was seen by Orthopedic Surgery after an MRI of the foot also showed cellulitis and myositis of the foot with findings concerning for osteomyelitis on the head of the second metatarsal and proximal middle phalanx of the second toe with some milder degree of skin changes in the proximal phalanx of the third toe.  The patient underwent amputation today of the left second toe by Orthopedic Surgery.  We were consulted to assist in the workup and management of this patient with necrotic foot infection with osteomyelitis.  PAST MEDICAL HISTORY: 1. Stage IV vaginal cancer and stage II vulvar  cancer, status post     chemotherapy and radiation. 2. Squamous cell carcinoma of the cervix in 2011, status post     chemotherapy and radiation. 3. Anemia. 4. Diabetes, not on medications currently. 5. Latent tuberculosis.  PAST SURGICAL HISTORY:  Amputation as described above.  FAMILY HISTORY:  Noncontributory.  SOCIAL HISTORY:  Nonsmoker, nondrinker, no recreational drugs. Currently not working due to medical problems.  Not insured.  REVIEW OF SYSTEMS:  As described in history of present illness. Otherwise, a 12-point review of systems is negative.  ALLERGIES:  No known drug allergies.  CURRENT MEDICATIONS:  Bupivacaine, , Lovenox, insulin, Zosyn, potassium, vancomycin, Tylenol, Percocet, Zofran.  PHYSICAL EXAMINATION:  VITAL SIGNS:  Temperature maximum over the last 24 hours is 102, temperature current 98.8, blood pressure 104/68, pulse 89, respirations 18, pulse oximetry is 94% room air. GENERAL:  Quite pleasant lady in no acute distress. HEENT:  Normocephalic, atraumatic.  Pupils are equal, round and reactive to light.  Sclerae icteric.  Oropharynx clear. NECK:  Supple. CARDIOVASCULAR:  Regular rate and rhythm.  No murmurs, gallops or rubs. LUNGS:  Clear to auscultation bilaterally without wheeze, rhonchi, or rales. ABDOMEN:  Soft, nondistended, nontender. EXTREMITIES:  The right foot has multiple onychomycotic nails as does the __________ of the left foot which is bandaged.  The right foot is also with an ulceration on the top of her second toe on this side.  The left foot is wrapped in extensive bandages.  As mentioned, onychomycotic nails are seen through the bandages.  LABORATORY DATA:  MRI as described above.  Surgical PCR for MRSA and MSSA is negative, on antibiotics.  Potassium today 3.4.  Metabolic panel today:  Sodium 811, potassium 2.7, chloride 106, bicarb 30, BUN and creatinine 2 and 0.56, glucose 123.  CBC with differential:  White count 8.6, hemoglobin  9.1, platelets 431. Vancomycin trough yesterday 5.5.  Microbiological data:  Urine culture on January 15, group A strep, 15,000 colonies.  Blood cultures from January 15, no growth to date.  Wound cultures, July 31, 2000, from a foot infection grew methicillin-sensitive staph aureus that was sensitive also to clindamycin, erythromycin, gentamicin, levofloxacin, Rifampin, Bactrim, vancomycin; was tetracycline-resistant and resistant to penicillin.  IMPRESSION AND RECOMMENDATIONS:  This is a 45 year old [place] lady with history of vulvar and cervical cancer, diabetes mellitus not on medications due to lack of health insurance and difficulties with [employer] apparently, who has succumbed to severe and necrotic foot infection with osteomyelitis of her second toe on the left, status post amputation, also with evidence of myositis in that same foot. 1. Necrotic toe with osteomyelitis and also with myositis of the foot     on the same side:  Likely culprits include methicillin-sensitive     staph aureus that she grew within this foot with an infection in     September 2001; also, methicillin-resistant staph aureus     streptococcal species as well as gram-negative rods given that she     is diabetic.  For now, while she is in-house, I will continue her     on vancomycin but ratchet up the trough to 15-20.  I will     substitute oral ciprofloxacin for Zosyn to see if this will cover     any gram negatives that might be involved.  The patient is not     wanting to go home with intravenous antibiotics and, therefore, we     will craft an oral regimen, which will be the following:  Two     double-strength Bactrim tablets twice daily for a total of 6 weeks     of therapy in total along with ciprofloxacin 500 mg twice daily as     well.  Prior to discharge, though, I will continue the patient on     intravenous vancomycin and oral ciprofloxacin to ensure that she is     improving on these  antibiotics.  I will arrange for follow up in my     clinic. 2. Screening:  I will check HIV. 3. Infection prevention:  I will place her on contact precautions     given the fact that she could have an MRSA infection and certainly     a staph, whether be MSSA or MRSA would be likely given the presence     of myositis of the foot. 4. Diabetes mellitus:  The patient needs a primary care physician.  I     would propose either plugging back into [employer] or possibly     following up with the Internal Medicine teaching service at [facility] if they are agreeable to this.  I will certainly follow  up the patient with regard to her osteomyelitis as an outpatient.     Acey Lav, MD     CV/MEDQ  D:  12/17/2010  T:  12/17/2010  Job:  [id number]  cc:   Feliberto Gottron. Turner Daniels, M.D. Fax: [phone]  Laurette Schimke, MD  Marcellus Scott, MD  Electronically Signed by Paulette Blanch DAM MD on 01/03/2011 10:29:39 AM

## 2011-01-05 ENCOUNTER — Encounter (HOSPITAL_COMMUNITY)
Admission: RE | Admit: 2011-01-05 | Discharge: 2011-01-05 | Disposition: A | Discharge status: Home / Self Care | Payer: Self-pay | Source: Ambulatory Visit | Attending: Radiation Oncology | Admitting: Radiation Oncology

## 2011-01-05 DIAGNOSIS — C52 Malignant neoplasm of vagina: Secondary | ICD-10-CM | POA: Insufficient documentation

## 2011-01-05 DIAGNOSIS — R599 Enlarged lymph nodes, unspecified: Secondary | ICD-10-CM | POA: Insufficient documentation

## 2011-01-05 LAB — GLUCOSE, CAPILLARY
Glucose-Capillary: 294 mg/dL — ABNORMAL HIGH (ref 70–99)
Glucose-Capillary: 294 mg/dL — ABNORMAL HIGH (ref 70–99)

## 2011-01-09 ENCOUNTER — Encounter (HOSPITAL_COMMUNITY): Payer: Self-pay

## 2011-01-09 ENCOUNTER — Ambulatory Visit: Payer: Self-pay | Attending: Radiation Oncology | Admitting: Radiation Oncology

## 2011-01-09 ENCOUNTER — Ambulatory Visit (HOSPITAL_COMMUNITY)
Admission: RE | Admit: 2011-01-09 | Discharge: 2011-01-09 | Disposition: A | Discharge status: Home / Self Care | Payer: Self-pay | Source: Ambulatory Visit | Attending: Radiation Oncology | Admitting: Radiation Oncology

## 2011-01-09 DIAGNOSIS — R599 Enlarged lymph nodes, unspecified: Secondary | ICD-10-CM | POA: Insufficient documentation

## 2011-01-09 DIAGNOSIS — C52 Malignant neoplasm of vagina: Secondary | ICD-10-CM | POA: Insufficient documentation

## 2011-01-09 MED ORDER — FLUDEOXYGLUCOSE F - 18 (FDG) INJECTION
19.2000 | Freq: Once | INTRAVENOUS | Status: AC | PRN
  Administered 2011-01-09: 19.2 via INTRAVENOUS

## 2011-01-13 ENCOUNTER — Encounter (HOSPITAL_BASED_OUTPATIENT_CLINIC_OR_DEPARTMENT_OTHER): Payer: Self-pay | Admitting: Oncology

## 2011-01-13 DIAGNOSIS — C52 Malignant neoplasm of vagina: Secondary | ICD-10-CM

## 2011-01-18 ENCOUNTER — Emergency Department (HOSPITAL_COMMUNITY): Payer: Self-pay

## 2011-01-18 ENCOUNTER — Emergency Department (HOSPITAL_COMMUNITY)
Admission: EM | Admit: 2011-01-18 | Discharge: 2011-01-18 | Disposition: A | Discharge status: Home / Self Care | Payer: Self-pay | Attending: Emergency Medicine | Admitting: Emergency Medicine

## 2011-01-18 DIAGNOSIS — M7989 Other specified soft tissue disorders: Secondary | ICD-10-CM | POA: Insufficient documentation

## 2011-01-18 DIAGNOSIS — L03119 Cellulitis of unspecified part of limb: Secondary | ICD-10-CM | POA: Insufficient documentation

## 2011-01-18 DIAGNOSIS — X58XXXA Exposure to other specified factors, initial encounter: Secondary | ICD-10-CM | POA: Insufficient documentation

## 2011-01-18 DIAGNOSIS — L02619 Cutaneous abscess of unspecified foot: Secondary | ICD-10-CM | POA: Insufficient documentation

## 2011-01-18 DIAGNOSIS — E119 Type 2 diabetes mellitus without complications: Secondary | ICD-10-CM | POA: Insufficient documentation

## 2011-01-18 DIAGNOSIS — IMO0002 Reserved for concepts with insufficient information to code with codable children: Secondary | ICD-10-CM | POA: Insufficient documentation

## 2011-01-18 DIAGNOSIS — M79609 Pain in unspecified limb: Secondary | ICD-10-CM | POA: Insufficient documentation

## 2011-01-18 DIAGNOSIS — Y929 Unspecified place or not applicable: Secondary | ICD-10-CM | POA: Insufficient documentation

## 2011-01-18 LAB — CBC
HCT: 36.1 % (ref 36.0–46.0)
Hemoglobin: 12 g/dL (ref 12.0–15.0)
MCH: 29.7 pg (ref 26.0–34.0)
MCHC: 33.2 g/dL (ref 30.0–36.0)
RDW: 13.1 % (ref 11.5–15.5)

## 2011-01-18 LAB — DIFFERENTIAL
Basophils Absolute: 0 10*3/uL (ref 0.0–0.1)
Basophils Relative: 1 % (ref 0–1)
Eosinophils Relative: 5 % (ref 0–5)
Monocytes Absolute: 0.2 10*3/uL (ref 0.1–1.0)
Monocytes Relative: 5 % (ref 3–12)
Neutro Abs: 1.9 10*3/uL (ref 1.7–7.7)

## 2011-01-18 LAB — POCT I-STAT, CHEM 8
Creatinine, Ser: 0.8 mg/dL (ref 0.4–1.2)
Glucose, Bld: 407 mg/dL — ABNORMAL HIGH (ref 70–99)
HCT: 37 % (ref 36.0–46.0)
Hemoglobin: 12.6 g/dL (ref 12.0–15.0)
TCO2: 28 mmol/L (ref 0–100)

## 2011-01-18 LAB — BASIC METABOLIC PANEL
BUN: 15 mg/dL (ref 6–23)
Calcium: 9.1 mg/dL (ref 8.4–10.5)
Creatinine, Ser: 0.68 mg/dL (ref 0.4–1.2)
GFR calc non Af Amer: >60 mL/min (ref >60)
Glucose, Bld: 412 mg/dL — ABNORMAL HIGH (ref 70–99)

## 2011-01-26 ENCOUNTER — Encounter: Payer: Self-pay | Admitting: Internal Medicine

## 2011-01-26 ENCOUNTER — Inpatient Hospital Stay (HOSPITAL_COMMUNITY): Payer: Medicaid Other

## 2011-01-26 ENCOUNTER — Encounter: Payer: Self-pay | Admitting: Infectious Disease

## 2011-01-26 ENCOUNTER — Inpatient Hospital Stay (HOSPITAL_COMMUNITY)
Admission: AD | Admit: 2011-01-26 | Discharge: 2011-01-30 | DRG: 617 | Disposition: A | Discharge status: Home Health | Payer: Medicaid Other | Source: Ambulatory Visit | Attending: Internal Medicine | Admitting: Internal Medicine

## 2011-01-26 ENCOUNTER — Ambulatory Visit (INDEPENDENT_AMBULATORY_CARE_PROVIDER_SITE_OTHER): Payer: Self-pay | Admitting: Infectious Disease

## 2011-01-26 DIAGNOSIS — L97509 Non-pressure chronic ulcer of other part of unspecified foot with unspecified severity: Secondary | ICD-10-CM | POA: Diagnosis present

## 2011-01-26 DIAGNOSIS — M86179 Other acute osteomyelitis, unspecified ankle and foot: Secondary | ICD-10-CM

## 2011-01-26 DIAGNOSIS — M869 Osteomyelitis, unspecified: Secondary | ICD-10-CM | POA: Diagnosis present

## 2011-01-26 DIAGNOSIS — E1169 Type 2 diabetes mellitus with other specified complication: Principal | ICD-10-CM | POA: Diagnosis present

## 2011-01-26 DIAGNOSIS — E11621 Type 2 diabetes mellitus with foot ulcer: Secondary | ICD-10-CM

## 2011-01-26 DIAGNOSIS — Z794 Long term (current) use of insulin: Secondary | ICD-10-CM

## 2011-01-26 DIAGNOSIS — D638 Anemia in other chronic diseases classified elsewhere: Secondary | ICD-10-CM | POA: Diagnosis present

## 2011-01-26 DIAGNOSIS — M9080 Osteopathy in diseases classified elsewhere, unspecified site: Secondary | ICD-10-CM | POA: Diagnosis present

## 2011-01-26 DIAGNOSIS — C52 Malignant neoplasm of vagina: Secondary | ICD-10-CM | POA: Diagnosis present

## 2011-01-26 DIAGNOSIS — E119 Type 2 diabetes mellitus without complications: Secondary | ICD-10-CM

## 2011-01-26 LAB — COMPREHENSIVE METABOLIC PANEL
ALT: 14 U/L (ref 0–35)
AST: 15 U/L (ref 0–37)
Alkaline Phosphatase: 129 U/L — ABNORMAL HIGH (ref 39–117)
CO2: 26 mEq/L (ref 19–32)
Chloride: 101 mEq/L (ref 96–112)
GFR calc Af Amer: >60 mL/min (ref >60)
GFR calc non Af Amer: >60 mL/min (ref >60)
Glucose, Bld: 291 mg/dL — ABNORMAL HIGH (ref 70–99)
Potassium: 4.6 mEq/L (ref 3.5–5.1)
Sodium: 135 mEq/L (ref 135–145)
Total Bilirubin: 0.5 mg/dL (ref 0.3–1.2)

## 2011-01-26 LAB — MAGNESIUM: Magnesium: 2.1 mg/dL (ref 1.5–2.5)

## 2011-01-26 LAB — CBC
HCT: 37 % (ref 36.0–46.0)
Hemoglobin: 12.5 g/dL (ref 12.0–15.0)
MCHC: 33.8 g/dL (ref 30.0–36.0)
RBC: 4.24 MIL/uL (ref 3.87–5.11)

## 2011-01-27 LAB — LIPID PANEL
Cholesterol: 150 mg/dL (ref 0–200)
Total CHOL/HDL Ratio: 2.8 RATIO

## 2011-01-27 LAB — GLUCOSE, CAPILLARY
Glucose-Capillary: 167 mg/dL — ABNORMAL HIGH (ref 70–99)
Glucose-Capillary: 368 mg/dL — ABNORMAL HIGH (ref 70–99)

## 2011-01-27 LAB — BASIC METABOLIC PANEL
BUN: 12 mg/dL (ref 6–23)
Calcium: 8.6 mg/dL (ref 8.4–10.5)
Chloride: 103 mEq/L (ref 96–112)
Creatinine, Ser: 0.48 mg/dL (ref 0.4–1.2)
GFR calc non Af Amer: >60 mL/min (ref >60)

## 2011-01-27 LAB — CBC
MCH: 29.4 pg (ref 26.0–34.0)
MCHC: 33.7 g/dL (ref 30.0–36.0)
MCV: 87.1 fL (ref 78.0–100.0)
Platelets: 272 10*3/uL (ref 150–400)
RDW: 12.9 % (ref 11.5–15.5)

## 2011-01-27 LAB — URINALYSIS, ROUTINE W REFLEX MICROSCOPIC
Bilirubin Urine: NEGATIVE
Nitrite: NEGATIVE
Specific Gravity, Urine: 1.024 (ref 1.005–1.030)
Urobilinogen, UA: 0.2 mg/dL (ref 0.0–1.0)

## 2011-01-27 LAB — HEMOGLOBIN A1C: Mean Plasma Glucose: 283 mg/dL — ABNORMAL HIGH (ref <117)

## 2011-01-27 LAB — URINE MICROSCOPIC-ADD ON

## 2011-01-27 LAB — TSH: TSH: 0.56 u[IU]/mL (ref 0.350–4.500)

## 2011-01-28 ENCOUNTER — Inpatient Hospital Stay (HOSPITAL_COMMUNITY): Payer: Medicaid Other

## 2011-01-28 DIAGNOSIS — M869 Osteomyelitis, unspecified: Secondary | ICD-10-CM

## 2011-01-28 LAB — GLUCOSE, CAPILLARY
Glucose-Capillary: 214 mg/dL — ABNORMAL HIGH (ref 70–99)
Glucose-Capillary: 188 mg/dL — ABNORMAL HIGH (ref 70–99)
Glucose-Capillary: 163 mg/dL — ABNORMAL HIGH (ref 70–99)
Glucose-Capillary: 164 mg/dL — ABNORMAL HIGH (ref 70–99)

## 2011-01-28 LAB — BASIC METABOLIC PANEL
BUN: 8 mg/dL (ref 6–23)
CO2: 29 mEq/L (ref 19–32)
Calcium: 8.7 mg/dL (ref 8.4–10.5)
Creatinine, Ser: 0.47 mg/dL (ref 0.4–1.2)
GFR calc Af Amer: >60 mL/min (ref >60)

## 2011-01-28 LAB — CBC
MCH: 28.5 pg (ref 26.0–34.0)
MCHC: 32.8 g/dL (ref 30.0–36.0)
Platelets: 289 10*3/uL (ref 150–400)

## 2011-01-29 DIAGNOSIS — M869 Osteomyelitis, unspecified: Secondary | ICD-10-CM

## 2011-01-29 HISTORY — PX: OTHER SURGICAL HISTORY: SHX169

## 2011-01-29 LAB — BASIC METABOLIC PANEL
BUN: 5 mg/dL — ABNORMAL LOW (ref 6–23)
CO2: 26 mEq/L (ref 19–32)
Chloride: 104 mEq/L (ref 96–112)
Creatinine, Ser: 0.49 mg/dL (ref 0.4–1.2)
Glucose, Bld: 238 mg/dL — ABNORMAL HIGH (ref 70–99)

## 2011-01-29 LAB — GLUCOSE, CAPILLARY: Glucose-Capillary: 224 mg/dL — ABNORMAL HIGH (ref 70–99)

## 2011-01-29 LAB — CBC
Hemoglobin: 11.1 g/dL — ABNORMAL LOW (ref 12.0–15.0)
MCV: 86 fL (ref 78.0–100.0)
Platelets: 307 10*3/uL (ref 150–400)
RBC: 3.78 MIL/uL — ABNORMAL LOW (ref 3.87–5.11)
WBC: 3.7 10*3/uL — ABNORMAL LOW (ref 4.0–10.5)

## 2011-01-29 LAB — VANCOMYCIN, TROUGH: Vancomycin Tr: 11.6 ug/mL (ref 10.0–20.0)

## 2011-01-30 LAB — GLUCOSE, CAPILLARY: Glucose-Capillary: 186 mg/dL — ABNORMAL HIGH (ref 70–99)

## 2011-01-30 LAB — WOUND CULTURE

## 2011-01-30 NOTE — Op Note (Signed)
[name], [name]                 ACCOUNT NO.:  [id number]  MEDICAL RECORD NO.:  [id number]           PATIENT TYPE:  LOCATION:                                 FACILITY:  PHYSICIAN:  Feliberto Gottron. Turner Daniels, M.D.   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  PROCEDURE:  Irrigation and debridement of left foot with removal of left second metatarsal head.  She had a metatarsophalangeal amputation about 6 weeks ago.  SURGEON:  Feliberto Gottron. Turner Daniels, MD  FIRST ASSISTANT:  Shirl Harris, [facility]  ANESTHETIC:  General LMA.  ESTIMATED BLOOD LOSS:  20 mL.  FLUID REPLACEMENT:  500 mL of crystalloid.  DRAINS PLACED:  Iodoform wick.  INDICATIONS FOR PROCEDURE:  A 45 year old woman with diabetes which is very poorly controlled and a relatively poor home situation.  I met her at [facility] about 6-8 weeks ago when she was admitted by the medicine service with early diabetic ketoacidosis and diabetic foot ulcer on the left side that involved the second metatarsal and the second toe and she had a second toe MTP joint amputation secondary to necrosis from the diabetic ulcer.  She initially did pretty well on IV antibiotics, although she had difficulty getting to the wound center at [facility].  When she came to see me in the office on a weekly basis, we spent a fair amount of time removing all dead skin from her foot.  In any event, she was seen by Dr. Daiva Eves over at the ID clinic couple of days ago, was noted to have some new skin necrosis laterally on the foot and MRI scan was performed showing osteomyelitis of the second metatarsal head with demineralization.  I have reviewed the MRI scan and I would concur that she has active  osteomyelitis and she is taken for left foot irrigation and debridement and removal of the second metatarsal head or transmetatarsal amputation.  Interestingly, the skin necrosis she was admitted for is actually healing and does not appear to be involved the infection.  This was laterally along the fifth metatarsal.  In any event, risks and benefits of surgery have been discussed with the patient, questions have been answered through an interpreter.  DESCRIPTION OF PROCEDURE:  The patient was identified by armband and taken from the holding area to operating room 7, appropriate site monitors were attached, general LMA anesthesia induced.  Tourniquet applied to the left ankle but never used.  Left foot prepped and draped in usual sterile fashion from the toes to the tourniquet.  Time-out procedure was performed.  We went ahead and made a longitudinal dorsal incision starting out at the old MTP amputation incision and coring out the edges of the skin and going proximally for about 2.5 cm.  We immediately encountered a granulation tissue over the metatarsal head that was consistent with active osteomyelitis and there was evidence of dissolution of  the bone distally on the metatarsal.  Using Metzenbaum scissors, we dissected around the metatarsal head leaving the infected material attached and using a small ACL also, I removed at the distal third of the metatarsal cutting back to normal-appearing bone.  We then used rongeurs to remove any dead necrotic material from the bed of the foot, followed by thorough irrigation with normal saline solution alternating with hydrogen peroxide.  Throughout the procedure, we never used the tourniquet and only had to use the Bovie once, which said that her diabetic condition has left her with poor vasculature to the foot. At the end of the irrigation and debridement, we placed an Iodoform wick in the wound, bringing it out proximally and then loosely closed  the wound with 4 vertical mattress 2-0 nylon sutures, followed by a dressing of Xeroform, 4x4 dressings, sponges, Webril, and Ace wrap.  At this point, the patient was awakened and extubated and taken to the recovery room without difficulty.     Feliberto Gottron. Turner Daniels, M.D.     Ovid Curd  D:  01/28/2011  T:  01/29/2011  Job:  [id number]  Electronically Signed by Gean Birchwood M.D. on 01/30/2011 06:39:03 AM

## 2011-01-31 LAB — ANAEROBIC CULTURE

## 2011-01-31 LAB — TISSUE CULTURE: Culture: NO GROWTH

## 2011-02-02 ENCOUNTER — Encounter (INDEPENDENT_AMBULATORY_CARE_PROVIDER_SITE_OTHER): Payer: Self-pay | Admitting: Nurse Practitioner

## 2011-02-02 ENCOUNTER — Telehealth: Payer: Self-pay | Admitting: Ophthalmology

## 2011-02-02 LAB — ANAEROBIC CULTURE: Gram Stain: NONE SEEN

## 2011-02-02 LAB — CULTURE, BLOOD (ROUTINE X 2)
Culture  Setup Time: 201202280110
Culture  Setup Time: 201202280110
Culture: NO GROWTH

## 2011-02-02 NOTE — Telephone Encounter (Signed)
I spoke with the patient via spanish interpreter and to discuss her inability to afford her medications.  Pt states that she has not been to the pharmacy yet to see how much the medications cost but is willing to go tonight.  I told the patient that if she is unable to afford her medicines that she should come to the clinic tomorrow and discuss her inlantal situation with [facility]  I informed the patient that it is extremely important that she take both her flagyl and her insulin and she states that she understands this fact.

## 2011-02-04 ENCOUNTER — Encounter (HOSPITAL_BASED_OUTPATIENT_CLINIC_OR_DEPARTMENT_OTHER): Payer: Self-pay | Attending: Physician Assistant

## 2011-02-05 ENCOUNTER — Encounter (INDEPENDENT_AMBULATORY_CARE_PROVIDER_SITE_OTHER): Payer: Self-pay | Admitting: *Deleted

## 2011-02-05 NOTE — Assessment & Plan Note (Signed)
Summary: hsfu need chart/osteomyelitis   Vital Signs:  Patient profile:   45 year old female Height:      60 inches (152.40 cm) Weight:      120.12 pounds (54.60 kg) BMI:     23.54 Temp:     99.7 degrees F (37.61 degrees C) oral Pulse rate:   129 / minute BP sitting:   131 / 77  (left arm)  Vitals Entered By: Wendall Mola CMA Duncan Dull) (January 26, 2011 2:16 PM) CC: new pt.,  left second toe infection Is Patient Diabetic? Yes Did you bring your meter with you today? No   Visit Type:  Follow-up Primary Provider:  unassigned  CC:  new pt. and left second toe infection.  History of Present Illness: 45 year old [place] lady DM, who developed a necrotic 2nd toe with myositis of foot with systemic symptoms in January. She was admitted and placed on vancomycin and zosyn and ultimately underwent amputation of toe by Dr. Turner Daniels and was transitioned from IV antibiotics to oral cipro and bactrim and took these up until 2 wks ago. Then one week ago she developed swelling of her foot with a new area that had blistered on lateral aspect of her foot. She was seen in the [facility] ED and given a "very powerful antibiotic"--which was doxycycline with some improvement in her edema and pain. However since then she has developed fevers, chills, rigors, nausea and increased foot pain. On exam she has blackened area at amputation site. She has bullous lesion on the side of foot. She also has erythema on ankle which she attributes to her wrap for her foot wound. We spent greater than one hour in care of this patient including greater than 50% of time in face to face counselling of the patient and in coordination of care.  Current Medications (verified): 1)  Insulin Syringes .... Inject Lantus Once Daily and Novolog Three Times A Day As Directed 2)  Ferrous Sulfate 325 (65 Fe) Mg Tabs (Ferrous Sulfate) .... Take 1 Tablet By Mouth Two Times A Day 3)  Novolog 100 Unit/ml Soln (Insulin Aspart) .... Inject 6  Units With Each Meal ( Three Times A Day ) 4)  Lantus 100 Unit/ml Soln (Insulin Glargine) .... 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  recent admxn with mets to bladder (12/2009)   b.  Radiation therapy planned with an eye towards ChemoTx (Dr. Laurette Schimke)   c.  Cysto performed by Dr. Elbert Ewings. Vonita Moss 12/2009 with evac of clots and biopsy and fulguration   d.  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  2/2 to blood loss (12/2009) Diabetes mellitus, type II  Past Surgical History: Last updated: 02/18/2010 1. Radical wide local excision of the vulva and right inguinal lymph       node dissection in 2004.   2. Uterine dilatation and curettage remotely.   Social History: Last updated: 02/18/2010 Married 2 kids no cigs no ETOH  Family History: noncontributory  Review of Systems       see HPI otherwise negative on 12 point review  Physical Exam  General:  alert and well-hydrated.   Head:  normocephalic and atraumatic.   Eyes:  vision grossly intact, pupils equal, and pupils round.   Ears:  no  external deformities and ear piercing(s) noted.   Nose:  no external deformity and no external erythema.   Mouth:  pharynx pink and moist, no erythema, and no exudates.   Neck:  supple and full ROM.   Lungs:  normal respiratory effort, no crackles, and no wheezes.   Heart:  normal rate, regular rhythm, and no murmur.   Abdomen:  soft and no distention.   Msk:  see skin Extremities:  see skin Neurologic:  alert & oriented X3, strength normal in all extremities, and gait normal.   Skin:  her toenails are onychomycotic bilaterally. she has area of old ulceration on right 3rd toe. Her 2nd tow site has area proximal to tit that is dark with black color, her pulses are  palpable bilateraly Psych:  Oriented X3.  dysphoric, anxious   Impression & Recommendations:  Problem # 1:  ACUTE OSTEOMYELITIS, ANKLE AND FOOT (ICD-730.07) Assessment Deteriorated  Given her systemic symptoms and worsening of pain in her foot and new lesions which occured one week after stopping antibiotics I am VERY concerned for recurrence of osteomyeliits in this foot. She has some areas that are concerning for PVD--though her ABIs were normal or a vasculitis. I am admitting her to the hospital. We will get an MRI with contrast for foot and ankle on the left side. Would check blood cultures, esr, crp cmp, cbc, ANCA, ANA, .  I would hold off on empiric antibiotics until we have obtained images. If there is evidence of osteomyelitis then I would ask Dr. Wadie Lessen group to see patient. I would prefer to have the patient undergo definitive surgery or at minimumbone biopsy and culture PRIOR to starting empiric antibiotics. If I were to start empriic antibiotics I would use vancomycin ALONE for starters, again my STRONG preference is to get data and culture FIRST Her updated medication list for this problem includes:    Doxycycline Hyclate 100 Mg Tabs (Doxycycline hyclate) .Marland Kitchen..Marland Kitchen Two times a day rx by ed  Orders: Est. Patient Level V (04540)  Problem # 2:  DIABETES MELLITUS, TYPE II (ICD-250.00)  This DOES put her at higher risk for other pathogens including GNR such as pseudomonas but I would not start off with broad antibiotics at first and as above would withold abx until data can be collected Her updated medication list for this problem includes:    Novolog 100 Unit/ml Soln (Insulin aspart) ..... Inject 6 units with each meal ( three times a day )    Lantus 100 Unit/ml Soln (Insulin glargine) ..... Inject 20 units at bedtime  Orders: Est. Patient Level V (98119)  Medications Added to Medication List This Visit: 1)  Doxycycline Hyclate 100 Mg Tabs (Doxycycline hyclate) .... Two times a day  rx by ed   Orders Added: 1)  Est. Patient Level V [14782]

## 2011-02-05 NOTE — Initial Assessments (Signed)
INTERNAL MEDICINE ADMISSION HISTORY AND PHYSICAL First Contact: Dr. Dorthula Rue [phone]   Second Contact: Dr. Denton Meek [phone] Weekends, [employer], or after 5pm weekdays: First Contact: [phone] Second Contact:[phone]  PCP: Dr. Alben Spittle  CC: L diabetic foot ulcer  HPI: 45 y/o woman with PMH significant for poorly controlled diabetes(HbA1c of 13.2 form Jan 2011) , s/p amputation of second toe is a direct admit form ID clinic for L diabetic foot ulcer.  She was discharged on ciprofloxacin and bactrim on Jan 20th after the amputation and reports that she noticed one black spot on her ankle and some peeling of her skin after the surgery that she thought was likely related to bandage. Around 1 week ago she noticed some discoloration and blister on the lateral aspect of her foot for which she went to the ER and was discharged home on doxycycline. She followed up in the ID clinic and was admitted for worsening of her her L diabetic foot ulcer. She reports some associated swelling and pain that has been gradually getting worse. She reports that she has been taking pain medications but that has not been helping her. She also reports subjective fever and chills. She also reports some nausea nad has vomited couple of times in the last 24 hours. She has not been  taiking insulin for quite sometime, the last time when she took insulin was first week of February.   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  recent admxn with mets to bladder (12/2009)   b.  Radiation therapy planned with an eye towards ChemoTx (Dr. Laurette Schimke)   c.  Cysto performed by Dr. Elbert Ewings. Vonita Moss 12/2009 with evac of clots and biopsy and fulguration   d.  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  2/2 to blood loss (12/2009) Diabetes mellitus, type II Left second toe diabetic infection and necrosis, status  post left       second toe metatarsophalangeal joint amputation on 12/19/10 Anemia of chronic disease( Baseline Hb-9.0)   MEDICATIONS: Current Meds:  * INSULIN SYRINGES Inject Lantus once daily and Novolog three times a day as directed FERROUS SULFATE 325 (65 FE) MG TABS (FERROUS SULFATE) Take 1 tablet by mouth two times a day NOVOLOG 100 UNIT/ML SOLN (INSULIN ASPART) Inject 6 units with each meal ( three times a day ) LANTUS 100 UNIT/ML SOLN (INSULIN GLARGINE) Inject 20 units at bedtime   SOCIAL HISTORY:  Married 2 kids no cigs no ETOH  FAMILY HISTORY;  She has family history of diabetes.    ROS: As per HPI  VITALS: T: 100.0 P:110  BP:114/77  R: 18 O2SAT: 97% on  ON:RA  PHYSICAL EXAM: Gen: Patient is in NAD, Pleasant. Eyes: PERRL, EOMI, No signs of anemia or jaundince. ENT: MMM, OP clear, No erythema, thrush or exudates. Neck: Supple, No carotid Bruits, No JVD, No thyromegaly Resp: CTA- Bilaterally, No W/C/R. CVS: S1S2 RRR, No M/R/G GI: Abdomen is soft. ND, NT, NG, NR, BS+. No organomegaly. Ext: L foot second toe ampuated, around 2 cm open wound draining some serosanguinous discharge, adjacent skin peeling is present, around 2x3 cm blackish discoloration is present in the lateral aspect of her left foot and one black spot on the dorsum aspect of her ankle, distal pulses are present. GU: No CVA tenderness. Skin: No visible rashes, scars. Lymph: No palpable lymphadenopathy. MS: Moving  all 4 extremities. Neuro: A&O X3, CN II - XII are grossly intact. Motor strength is 5/5 in the all 4 extremities, Sensations intact to light touch, Gait normal, Cerebellar signs negative. Psych: Appropriate  LABS:   ASSESSMENT AND PLAN: (1)L diabetic foot ulcer: With her history of poorly controlled diabetes, non compliance with her insulin and with the appearence of her foot - she has Diabetic foot ulcer.It is likely that she has necrotizing fascitis or gangrene associated with it given  her h/o recent surgical procedure and X- ray of her left foot on feb 19th that showed  gas bubbles in the subcutaneous tissues at the site of the second MTP amputation. Will admit her to regular floor. Will check her CBC, CMET, AIC, blood cultues and wound cultures. Will start her on Vanc and primaxin for now. Will also get a wound care consult and MRI of her foot to rule out progressive osteomyelitis. Will cover her with SSI for now as she is NPO but will start her on lantus tomorrow. Will consult Dr. Elita Quick tomorrow(orthopedic surgeon) for further recs.  Risk stratification- FLP  (2)Anemia of chronic disease: Will monitor her H& H for now.  (3)VTE PROPH: Heparin  I performed and/or observed a history and physical examination of the patient.  I discussed the case with the residents as noted and reviewed the residents' notes.  I agree with the findings and plan--please refer to the attending physician note for more details.  Signature Printed Name

## 2011-02-06 ENCOUNTER — Encounter: Payer: Self-pay | Admitting: *Deleted

## 2011-02-10 NOTE — Miscellaneous (Signed)
Summary: Meds  Clinical Lists Changes Meds as per recent hospitalization  Medications: Added new medication of FLAGYL 500 MG TABS (METRONIDAZOLE) One tablet by mouth two times a day - Signed Added new medication of NOVOLOG 100 UNIT/ML SOLN (INSULIN ASPART) 3 units subcutaneously three times a day - Signed Changed medication from LANTUS 100 UNIT/ML SOLN (INSULIN GLARGINE) Inject 20 units at bedtime to LANTUS 100 UNIT/ML SOLN (INSULIN GLARGINE) One injection nightly for diabetes - Signed Removed medication of DOXYCYCLINE HYCLATE 100 MG TABS (DOXYCYCLINE HYCLATE) two times a day rx by ED Added new medication of CITALOPRAM HYDROBROMIDE 20 MG TABS (CITALOPRAM HYDROBROMIDE) One tablet by mouth daily for mood - Signed Added new medication of DOCUSATE SODIUM 100 MG CAPS (DOCUSATE SODIUM) One capsule by mouth two times a day - Signed Rx of FLAGYL 500 MG TABS (METRONIDAZOLE) One tablet by mouth two times a day;  #60 x 2;  Signed;  Entered by: Lehman Prom FNP;  Authorized by: Lehman Prom FNP;  Method used: Historical Rx of NOVOLOG 100 UNIT/ML SOLN (INSULIN ASPART) 3 units subcutaneously three times a day;  #1 vial x 3;  Signed;  Entered by: Lehman Prom FNP;  Authorized by: Lehman Prom FNP;  Method used: Historical Rx of LANTUS 100 UNIT/ML SOLN (INSULIN GLARGINE) One injection nightly for diabetes;  #1 x 3;  Signed;  Entered by: Lehman Prom FNP;  Authorized by: Lehman Prom FNP;  Method used: Historical Rx of CITALOPRAM HYDROBROMIDE 20 MG TABS (CITALOPRAM HYDROBROMIDE) One tablet by mouth daily for mood;  #30 x 3;  Signed;  Entered by: Lehman Prom FNP;  Authorized by: Lehman Prom FNP;  Method used: Historical Rx of DOCUSATE SODIUM 100 MG CAPS (DOCUSATE SODIUM) One capsule by mouth two times a day;  #60 x 3;  Signed;  Entered by: Lehman Prom FNP;  Authorized by: Lehman Prom FNP;  Method used: Historical    Prescriptions: DOCUSATE SODIUM 100 MG CAPS (DOCUSATE SODIUM) One  capsule by mouth two times a day  #60 x 3   Entered and Authorized by:   Lehman Prom FNP   Signed by:   Lehman Prom FNP on 02/02/2011   Method used:   Historical   RxID:   [id number] CITALOPRAM HYDROBROMIDE 20 MG TABS (CITALOPRAM HYDROBROMIDE) One tablet by mouth daily for mood  #30 x 3   Entered and Authorized by:   Lehman Prom FNP   Signed by:   Lehman Prom FNP on 02/02/2011   Method used:   Historical   RxID:   [id number] LANTUS 100 UNIT/ML SOLN (INSULIN GLARGINE) One injection nightly for diabetes  #1 x 3   Entered and Authorized by:   Lehman Prom FNP   Signed by:   Lehman Prom FNP on 02/02/2011   Method used:   Historical   RxID:   [id number] NOVOLOG 100 UNIT/ML SOLN (INSULIN ASPART) 3 units subcutaneously three times a day  #1 vial x 3   Entered and Authorized by:   Lehman Prom FNP   Signed by:   Lehman Prom FNP on 02/02/2011   Method used:   Historical   RxID:   [id number] FLAGYL 500 MG TABS (METRONIDAZOLE) One tablet by mouth two times a day  #60 x 2   Entered and Authorized by:   Lehman Prom FNP   Signed by:   Lehman Prom FNP on 02/02/2011   Method used:   Historical   RxID:   [id number]

## 2011-02-10 NOTE — Miscellaneous (Signed)
Summary: Medication list updated  Clinical Lists Changes  Medications: Added new medication of CEFTRIAXONE SODIUM 2 GM SOLR (CEFTRIAXONE SODIUM) Administer 2 grams IV via PIC daily x 6 weeks Changed medication from LANTUS 100 UNIT/ML SOLN (INSULIN GLARGINE) One injection nightly for diabetes to LANTUS 100 UNIT/ML SOLN (INSULIN GLARGINE) Administer 15 units subcutaneously at bedtime. Changed medication from NOVOLOG 100 UNIT/ML SOLN (INSULIN ASPART) Inject 6 units with each meal ( three times a day ) to NOVOLOG 100 UNIT/ML SOLN (INSULIN ASPART) Inject 3 units subcutaneously with each meal ( three times a day ) Changed medication from FLAGYL 500 MG TABS (METRONIDAZOLE) One tablet by mouth two times a day to FLAGYL 500 MG TABS (METRONIDAZOLE) One tablet by mouth two times a day for 6 weeks

## 2011-02-11 ENCOUNTER — Encounter: Payer: Self-pay | Admitting: Nurse Practitioner

## 2011-02-11 ENCOUNTER — Encounter (INDEPENDENT_AMBULATORY_CARE_PROVIDER_SITE_OTHER): Payer: Self-pay | Admitting: Nurse Practitioner

## 2011-02-11 LAB — CONVERTED CEMR LAB: Blood Glucose, Fingerstick: 332

## 2011-02-13 ENCOUNTER — Encounter: Payer: Self-pay | Admitting: Infectious Diseases

## 2011-02-13 ENCOUNTER — Ambulatory Visit (INDEPENDENT_AMBULATORY_CARE_PROVIDER_SITE_OTHER): Payer: Self-pay | Admitting: Infectious Diseases

## 2011-02-13 DIAGNOSIS — M86179 Other acute osteomyelitis, unspecified ankle and foot: Secondary | ICD-10-CM

## 2011-02-17 NOTE — Assessment & Plan Note (Signed)
Summary: HFU/Osteomyelitis/On inpt [facility] march/brad bowen,md/kdw   Vital Signs:  Patient profile:   45 year old female Height:      60 inches (152.40 cm) Weight:      120.8 pounds (54.91 kg) BMI:     23.68 Temp:     98.3 degrees F (36.83 degrees C) oral Pulse rate:   99 / minute BP sitting:   92 / 61  (right arm)  Vitals Entered By: Wendall Mola CMA Duncan Dull) (February 13, 2011 11:36 AM) CC: hospital followup left second toe amputation Is Patient Diabetic? Yes Did you bring your meter with you today? No Pain Assessment Patient in pain? no      Nutritional Status BMI of 19 -24 = normal Nutritional Status Detail appetite "normal"  Have you ever been in a relationship where you felt threatened, hurt or afraid?Unable to ask   Does patient need assistance? Functional Status Cook/clean, Research scientist (life sciences) Provider:  unassigned  CC:  hospital followup left second toe amputation.  History of Present Illness: 45 yo F from [place], with  DM, metatstic Vaginal Ca, who developed a necrotic 2nd toe with myositis of foot with systemic symptoms in January. She was treated with vancomycin and zosyn and ultimately underwent amputation of toe by Dr. Turner Daniels and was transitioned from IV antibiotics to oral cipro and bactrim and took these up until early February. By Rica Mote, she developed swelling of her foot with a new area that had blistered on lateral aspect of her foot. She was seen in the [facility] ED and given doxycycline. She was seen in ID clinic 01-26-11 and admitted to the hospital.  MRI 01-26-11 showed progressive osteomyelitis in the head of the second metatarsal, with fluid signal intensity tracking around the second metatarsal shaft potentially representing incipient periosteal abscess. She underwent  irrigation and debridement of left foot with removal of the left second metatarsal head was performed on January 29, 2011. Her Cx from OR grew MSSA and Group B strep. She  was d/c home on Ceftriaxone and Flagyl. Plan for 6 weeks of antibiotics  Has wound on side of her foot and on dorsum. Has dryness on the dorsum of her foot as well. No problems with PIC line. FSG has been as high as 300 (at [facility]). She does not have a meter.   Preventive Screening-Counseling & Management  Alcohol-Tobacco     Alcohol drinks/day: 0     Smoking Status: never  Caffeine-Diet-Exercise     Caffeine use/day: coffee 2 per day     Does Patient Exercise: no  Safety-Violence-Falls     Seat Belt Use: yes      Sexual History:  currently monogamous.    Current Medications (verified): 1)  Insulin Syringes .... Inject Lantus Once Daily and Novolog Three Times A Day As Directed 2)  Lantus 100 Unit/ml Soln (Insulin Glargine) .... Administer 20 Units Subcutaneously At Bedtime. 3)  Flagyl 500 Mg Tabs (Metronidazole) .... One Tablet By Mouth Two Times A Day For 6 Weeks 4)  Novolog 100 Unit/ml Soln (Insulin Aspart) .... 3 Units Subcutaneously Three Times A Day 5)  Citalopram Hydrobromide 20 Mg Tabs (Citalopram Hydrobromide) .... One Tablet By Mouth Daily For Mood 6)  Docusate Sodium 100 Mg Caps (Docusate Sodium) .... One Capsule By Mouth Two Times A Day 7)  Ceftriaxone Sodium 2 Gm Solr (Ceftriaxone Sodium) .... Administer 2 Grams Iv Via Pic Daily X 6 Weeks 8)  Lancets  Misc (Lancets) .... Use  To Check Blood Sugar Three Times A Day 9)  Blood Glucose Test  Strp (Glucose Blood) .... Use To Check Blood Sugar Three Times A Day 10)  Wavesense Presto Pro Meter  Devi (Blood Glucose Monitoring Suppl) .... 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  recent admxn with mets to bladder (12/2009)   b.  Radiation therapy planned with an eye towards ChemoTx (Dr. Laurette Schimke)   c.  Cysto performed by Dr. Elbert Ewings. Vonita Moss 12/2009 with evac of clots and biopsy and fulguration   d.  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  2/2 to blood loss (12/2009) Diabetes mellitus, type II  Osteomyelitis L foot- s/p removal 2nd MT head 01-29-11.      Cx MSSA and GBS  Social History: Sexual History:  currently monogamous  Review of Systems       no problems with reading.   Physical Exam  General:  well-developed, well-nourished, and well-hydrated.   Eyes:  pupils equal, pupils round, and pupils reactive to light.   Mouth:  pharynx pink and moist, no exudates, and poor dentition.   Lungs:  normal respiratory effort and normal breath sounds.   Heart:  normal rate, regular rhythm, and no murmur.   Abdomen:  soft, non-tender, and normal bowel sounds.   Extremities:  LUE- pic clean, NT, no d/c.  LLE- wound is well healed, no d/c, no fluctuance, non-tender. she has thickened skin with some dired blood on lateral foot that is NT. She has healing blister sites on her doral foot that are clean and NT.  Additional Exam:  dried, dead skin is partiall removed with fingers and scissors from her L foot. no bleeding. no pain wiht this.    Impression & Recommendations:  Problem # 1:  ACUTE OSTEOMYELITIS, ANKLE AND FOOT (ICD-730.07)  will continue her on her antibiotics for the total of 6 weeks as written. I reinforced with her the need for her to control her DM (through interpreter as well). She has IM appt next week, she is aware of this and plans to f/u. Will have her return to clinic in 4 weeks for repeat f/u.   Her updated medication list for this problem includes:    Flagyl 500 Mg Tabs (Metronidazole) ..... One tablet by mouth two times a day for 6 weeks    Ceftriaxone Sodium 2 Gm Solr (Ceftriaxone sodium) .Marland Kitchen... Administer 2 grams iv via pic daily x 6 weeks  Orders: Est. Patient Level IV ([address])   Orders Added: 1)  Est. Patient Level IV  [60454]

## 2011-02-17 NOTE — Assessment & Plan Note (Signed)
Summary: Diabetes   Vital Signs:  Patient profile:   45 year old female Weight:      123.19 pounds Temp:     98.2 degrees F oral Pulse rate:   94 / minute Pulse rhythm:   regular Resp:     22 per minute BP sitting:   114 / 72  (right arm) Cuff size:   small  Vitals Entered By: Hale Drone, CMA (February 11, 2011 2:56 PM) CC: f/u from [facility] hosp. for left toe amputation.  Is Patient Diabetic? Yes CBG Result 332 CBG Device ID non fasting  Does patient need assistance? Functional Status Self care Ambulation Wheelchair   Primary Care Provider:  unassigned  CC:  f/u from [facility] hosp. for left toe amputation. Marland Kitchen  History of Present Illness:  Hospitalized from 01/26/2011 to 01/30/2011 (full hospital d/c reviewed)  Pt is a 45 year old woman with PMH for poorly controlled diabetes, s/p amputation of the second toe, who was directly admitted from the ID clinic for left diabetic ulcer. The pt was d/c on ciprofloxacin and Bactrim on December 19, 2010 after amputation and reports that she noticed a black spt on her ankle and some peeling of her skin after the surgery that she thought was likely due to the bandage.  About a week later, she noticed some discoloration and blistering over the lateral aspect of her foot for which she went to the ED and was sent home on doxycycline.  The pt then followed up in the ID clinic and was admitted for worsening left diabetic foot ulcer.    1.  Stage IV vaginal cancer, path on bladder tumor from February 14782 was poorly differentiated with squamous cell carcinoma.  The pt was admitted for mets to the bladder in 12/2010. She has f/u appt with oncology  in 04/29/2011 for radiation and then in June 2012 for biopsy.   2. Radiation therapy was planned with an eye toward chemo treatment by Dr. Laurette Schimke  3.  Cysto was performed by Dr. Stark Bray in 12/2009 with evacuation of clots and biopsy and fulguriation  4.  She does have a history of stage II squamous cell  carcinoma of the vulva  5.  Anemia, not otherwise specified  6.  Type 2 diabetes -   7.  Left second toe diabetic infection and necrosis s/p left second toe metatarsophalangeal joint amputation on December 19, 2010  - MRI was performed progressive osteomyelitis in the head of the second metatarsal as well as periosteal abscess.  As a result of this a amputation of the second metatarsal head with debridement and irrigation and deep wound cultures.  The pt's wound culture did grow out abundant group B strep as well as moderate staph aureus that was NOT MRSA. She has an appt with Surgery in 2 days  She presents today with all her medications that she had filled at [facility] pharmacy after her hospitalization.  Current Medications (verified): 1)  Insulin Syringes .... Inject Lantus Once Daily and Novolog Three Times A Day As Directed 2)  Ferrous Sulfate 325 (65 Fe) Mg Tabs (Ferrous Sulfate) .... Take 1 Tablet By Mouth Two Times A Day 3)  Novolog 100 Unit/ml Soln (Insulin Aspart) .... Inject 3 Units Subcutaneously With Each Meal ( Three Times A Day ) 4)  Lantus 100 Unit/ml Soln (Insulin Glargine) .... Administer 15 Units Subcutaneously At Bedtime. 5)  Flagyl 500 Mg Tabs (Metronidazole) .... One Tablet By Mouth Two Times A Day For  6 Weeks 6)  Novolog 100 Unit/ml Soln (Insulin Aspart) .... 3 Units Subcutaneously Three Times A Day 7)  Citalopram Hydrobromide 20 Mg Tabs (Citalopram Hydrobromide) .... One Tablet By Mouth Daily For Mood 8)  Docusate Sodium 100 Mg Caps (Docusate Sodium) .... One Capsule By Mouth Two Times A Day 9)  Ceftriaxone Sodium 2 Gm Solr (Ceftriaxone Sodium) .... Administer 2 Grams Iv Via Pic Daily X 6 Weeks  Allergies (verified): No Known Drug Allergies  Review of Systems CV:  Denies chest pain or discomfort. Resp:  Denies cough. GI:  Denies abdominal pain, nausea, and vomiting. Derm:  Complains of poor wound healing.  Physical Exam  General:  alert.   Head:  normocephalic.     Lungs:  normal breath sounds.   Heart:  normal rate and regular rhythm.   Msk:  left foot - s/p 2nd toe amputation suture in place, well approximated no drainage  wheelchair Neurologic:  alert & oriented X3.     Impression & Recommendations:  Problem # 1:  DIABETES MELLITUS, TYPE II (ICD-250.00) wil lincrease lantus to 20 units - still not good control The following medications were removed from the medication list:    Novolog 100 Unit/ml Soln (Insulin aspart) ..... Inject 3 units subcutaneously with each meal ( three times a day ) Her updated medication list for this problem includes:    Lantus 100 Unit/ml Soln (Insulin glargine) .Marland Kitchen... Administer 20 units subcutaneously at bedtime.    Novolog 100 Unit/ml Soln (Insulin aspart) .Marland KitchenMarland KitchenMarland KitchenMarland Kitchen 3 units subcutaneously three times a day  Orders: Capillary Blood Glucose/CBG (82948) Hgb A1C ([address]UE)  Problem # 2:  DIABETIC FOOT ULCER, LEFT (ICD-707.14) s/p amputation  pt presents to office toay without dressing on the foot - area cleaned and dressed by nursing  pt advised to leave in place until f/u with the surgeon on tomorrow - address given pt is currently on ceftriaxone and flagyl  Problem # 3:  MALIGNANT NEOPLASM OF VAGINA (ICD-184.0) pt to f/u with oncology - Dr. Nelly Rout  Complete Medication List: 1)  Insulin Syringes  .... Inject lantus once daily and novolog three times a day as directed 2)  Lantus 100 Unit/ml Soln (Insulin glargine) .... Administer 20 units subcutaneously at bedtime. 3)  Flagyl 500 Mg Tabs (Metronidazole) .... One tablet by mouth two times a day for 6 weeks 4)  Novolog 100 Unit/ml Soln (Insulin aspart) .... 3 units subcutaneously three times a day 5)  Citalopram Hydrobromide 20 Mg Tabs (Citalopram hydrobromide) .... One tablet by mouth daily for mood 6)  Docusate Sodium 100 Mg Caps (Docusate sodium) .... One capsule by mouth two times a day 7)  Ceftriaxone Sodium 2 Gm Solr (Ceftriaxone sodium) .... Administer 2  grams iv via pic daily x 6 weeks 8)  Lancets Misc (Lancets) .... Use to check blood sugar three times a day 9)  Blood Glucose Test Strp (Glucose blood) .... Use to check blood sugar three times a day 10)  Wavesense Presto Pro Meter Devi (Blood glucose monitoring suppl) .... Substitute with whatever meter is available use to check blood sugar three times per day  Patient Instructions: 1)    2)  Keep your appointments as previously scheduled with the surgeon - Dr. Turner Daniels on tomorrow at 10:30 3)  Infection disease - Dr. Ninetta Lights on Friday, March 16th at 11:15 4)  [facility] with Dr. Saralyn Pilar on March 22nd at 1:15. 5)  Diabetes - increase the lantus to 20units subcutaneously nightly.  Keep taking novolog 3 units three times a day with meals. 6)  A prescription for a glucometer has been sent to the pharmacy. 7)  Be sure to pick this up on next week and check your blood sugar three times a day  8)  Follow up in this office in 6 weeks for diabetes Prescriptions: WAVESENSE PRESTO PRO METER  DEVI (BLOOD GLUCOSE MONITORING SUPPL) Substitute with whatever meter is available Use to check blood sugar three times per day  #1 meter x 0   Entered and Authorized by:   Lehman Prom FNP   Signed by:   Lehman Prom FNP on 02/11/2011   Method used:   Faxed to ...       [facility] (retail)       [address] [place], [place]  [address]       Ph: [phone]       Fax: [phone]   RxID:   [id number] BLOOD GLUCOSE TEST  STRP (GLUCOSE BLOOD) Use to check blood sugar three times a day  #100 x 11   Entered and Authorized by:   Lehman Prom FNP   Signed by:   Lehman Prom FNP on 02/11/2011   Method used:   Faxed to ...       [facility] - [facility] (retail)       [address] [place], [place]  [address]       Ph: [phone]       Fax: [phone]   RxID:   [id number] LANCETS  MISC  (LANCETS) use to check blood sugar three times a day  #100 x 11   Entered and Authorized by:   Lehman Prom FNP   Signed by:   Lehman Prom FNP on 02/11/2011   Method used:   Faxed to ...       [facility] - [facility] (retail)       [address] [place], [place]  [address]       Ph: [phone]       Fax: [phone]   RxID:   [id number]    Orders Added: 1)  Capillary Blood Glucose/CBG [82948] 2)  Hgb A1C [83036QW] 3)  Est. Patient Level IV [37106]    Laboratory Results   Blood Tests   Date/Time Received: February 11, 2011 3:15 PM   HGBA1C: 10.2%   (Normal Range: Non-Diabetic - 3-6%   Control Diabetic - 6-8%) CBG Random:: 332mg /dL       MRI EXAM  Procedure date:  01/29/2011  Findings:      progressive osteomylitis in the head of the second metatarsal withfluid signal intensity tracking around the second metatarsal shaft, potentially representing an incipient periosteal abscess

## 2011-02-18 LAB — COMPREHENSIVE METABOLIC PANEL
ALT: 12 U/L (ref 0–35)
CO2: 13 mEq/L — ABNORMAL LOW (ref 19–32)
Calcium: 8.9 mg/dL (ref 8.4–10.5)
Chloride: 101 mEq/L (ref 96–112)
Creatinine, Ser: 0.85 mg/dL (ref 0.4–1.2)
GFR calc non Af Amer: >60 mL/min (ref >60)
Glucose, Bld: 371 mg/dL — ABNORMAL HIGH (ref 70–99)
Sodium: 132 mEq/L — ABNORMAL LOW (ref 135–145)
Total Bilirubin: 1.4 mg/dL — ABNORMAL HIGH (ref 0.3–1.2)

## 2011-02-18 LAB — POCT I-STAT, CHEM 8
BUN: 8 mg/dL (ref 6–23)
Calcium, Ion: 1.19 mmol/L (ref 1.12–1.32)
HCT: 29 % — ABNORMAL LOW (ref 36.0–46.0)
Hemoglobin: 9.9 g/dL — ABNORMAL LOW (ref 12.0–15.0)
Sodium: 133 mEq/L — ABNORMAL LOW (ref 135–145)
TCO2: 10 mmol/L (ref 0–100)

## 2011-02-18 LAB — URINE MICROSCOPIC-ADD ON

## 2011-02-18 LAB — DIFFERENTIAL
Basophils Relative: 0 % (ref 0–1)
Eosinophils Relative: 0 % (ref 0–5)
Lymphs Abs: 2.3 10*3/uL (ref 0.7–4.0)
Monocytes Absolute: 1.5 10*3/uL — ABNORMAL HIGH (ref 0.1–1.0)
Monocytes Relative: 6 % (ref 3–12)
Neutrophils Relative %: 85 % — ABNORMAL HIGH (ref 43–77)

## 2011-02-18 LAB — CULTURE, BLOOD (ROUTINE X 2)
Culture: NO GROWTH
Culture: NO GROWTH

## 2011-02-18 LAB — HEMOGLOBIN AND HEMATOCRIT, BLOOD
HCT: 21.5 % — ABNORMAL LOW (ref 36.0–46.0)
HCT: 23.1 % — ABNORMAL LOW (ref 36.0–46.0)
HCT: 26.9 % — ABNORMAL LOW (ref 36.0–46.0)
Hemoglobin: 6.8 g/dL — CL (ref 12.0–15.0)
Hemoglobin: 7.7 g/dL — ABNORMAL LOW (ref 12.0–15.0)

## 2011-02-18 LAB — GC/CHLAMYDIA PROBE AMP, GENITAL
Chlamydia, DNA Probe: NEGATIVE
GC Probe Amp, Genital: NEGATIVE

## 2011-02-18 LAB — URINALYSIS, ROUTINE W REFLEX MICROSCOPIC
Glucose, UA: >1000 mg/dL — AB
Ketones, ur: >80 mg/dL — AB
pH: 5 (ref 5.0–8.0)

## 2011-02-18 LAB — TYPE AND SCREEN

## 2011-02-18 LAB — CBC
Hemoglobin: 8.7 g/dL — ABNORMAL LOW (ref 12.0–15.0)
MCHC: 31.6 g/dL (ref 30.0–36.0)
MCV: 82.3 fL (ref 78.0–100.0)
RBC: 3.36 MIL/uL — ABNORMAL LOW (ref 3.87–5.11)

## 2011-02-18 LAB — GLUCOSE, CAPILLARY
Glucose-Capillary: 437 mg/dL — ABNORMAL HIGH (ref 70–99)
Glucose-Capillary: 210 mg/dL — ABNORMAL HIGH (ref 70–99)
Glucose-Capillary: 238 mg/dL — ABNORMAL HIGH (ref 70–99)
Glucose-Capillary: 239 mg/dL — ABNORMAL HIGH (ref 70–99)
Glucose-Capillary: 301 mg/dL — ABNORMAL HIGH (ref 70–99)
Glucose-Capillary: 375 mg/dL — ABNORMAL HIGH (ref 70–99)

## 2011-02-18 LAB — URINE CULTURE

## 2011-02-18 LAB — WET PREP, GENITAL

## 2011-02-18 LAB — POCT PREGNANCY, URINE: Preg Test, Ur: NEGATIVE

## 2011-02-19 ENCOUNTER — Encounter: Payer: Self-pay | Admitting: Internal Medicine

## 2011-02-19 ENCOUNTER — Ambulatory Visit: Payer: Self-pay | Admitting: Dietician

## 2011-02-19 ENCOUNTER — Ambulatory Visit (INDEPENDENT_AMBULATORY_CARE_PROVIDER_SITE_OTHER): Payer: Self-pay | Admitting: Internal Medicine

## 2011-02-19 ENCOUNTER — Other Ambulatory Visit: Payer: Self-pay | Admitting: Internal Medicine

## 2011-02-19 VITALS — BP 113/67 | HR 103 | Temp 97.4°F | Wt 122.4 lb

## 2011-02-19 DIAGNOSIS — E1165 Type 2 diabetes mellitus with hyperglycemia: Secondary | ICD-10-CM

## 2011-02-19 LAB — POCT GLYCOSYLATED HEMOGLOBIN (HGB A1C): Hemoglobin A1C: 9.6

## 2011-02-19 NOTE — Progress Notes (Signed)
Subjective:    Patient ID: [name], female    DOB: [birth year], 45 y.o.   MRN: [id number]  HPI  patient is a 45 year old female with a past medical history of diabetes mellitus type 2, stage IV vaginal cancer, anemia of chronic disease, recent diabetic foot ulcer, status post second metatarsal head amputation , who presents to clinic today for the following:  1) HFU for OM - Patient was hospitalized at [facility] from 02/27-03/02/12 for evaluation of worsening left diabetic foot ulcer, which was determined to be  Progressive osteomyelitis in the head of the second metatarsal and possible periosteal abscess.  Orthopedic surgery performed amputation of second metatarsal head with debridement and irrigation and deep wound cultures , which grew out abundant group B strep and moderate staph aureus that was not methicillin-resistant. Patient has previously failed outpatient oral antibiotic therapy, therefore IV antibiotic therapy was initiated and patient was to receive a total of 6 weeks of IV ceftriaxone and oral Flagyl, which was facilitated through home health nurse. Since hospital discharge, patient has followed up with [facility] on 316 2012 who recommended continued antibiotic therapy for the total 6 weeks duration as indicated. The left lower external E. Wound appeared well healed without discharge, fluctuance, tenderness.  2) DMII - Patient diagnosed with DM in 2001, and states that previously it was well controlled, however, she stopped all medications in 2007 secondary to financial constraints and [facility] Has now been on insulin therapy since last February. Patient checking blood sugars 1 time daily, after breakfast, has just started checking regularly since this week. Reports blood sugars of ranges between 120-220 mg/dL after breakfast. Currently taking Lantus 15 units qHS and Novolog 3 units before breakfast, lunch, and dinner. No hypoglycemic episodes (< 70  mg/dL) since last visit.   No polyuria, polydipsia, nausea, vomiting, occasional blurriness of vision (cannot remember last eye exam), no fevers, chills.   Review of Systems Per HPI.  Current Outpatient Medications Medication Sig  . cefTRIAXone (ROCEPHIN) 2 G SOLR Inject 2 g into the vein.    Marland Kitchen insulin aspart (NOVOLOG) 100 UNIT/ML injection Inject 3 Units into the skin 3 (three) times daily before meals.   . insulin glargine (LANTUS) 100 UNIT/ML injection Inject 20 Units into the skin at bedtime.   . metroNIDAZOLE (FLAGYL) 500 MG tablet Take 500 mg by mouth 3 (three) times daily.    Marland Kitchen doxycycline (VIBRAMYCIN) 100 MG capsule Take 100 mg by mouth 2 (two) times daily. rx by ED   . ferrous sulfate 325 (65 FE) MG tablet Take 325 mg by mouth 2 (two) times daily.      Allergies Review of patient's allergies indicates no known allergies.  Past Medical History  Diagnosis Date  . 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 Laurette Schimke), Cysto performed by Dr. Katherine Roan 12/2009 with evac of clots and bx and fulguration. // H/O stage 2 SCC of the vulva  . Anemia     2/2 blood loss  . Diabetes mellitus type 2, uncontrolled DX: 2001  . 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 Diabetic foot ulcers     Past Surgical History  Procedure Date  . Radical wide local excision of the vulva  and right nguinal lymph node dissection 10/2003    Dr. Stanford Breed  . Uterine dilatation and currettage   . Labial mass excision 07/2003    Dr. Elana Alm  . 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 Turner Daniels  . Irrigation and debridement of left foot with removal of left 01/29/2011    Dr. Turner Daniels       Objective:   Physical Exam General: Vital signs reviewed and noted.  Well-developed,well-nourished,in no acute distress; alert,appropriate and cooperative throughout examination. Head: normocephalic, atraumatic. Neck: No deformities, masses, or tenderness noted. Lungs: Normal respiratory effort. Clear to auscultation BL without crackles or wheezes.  Heart: RRR. S1 and S2 normal without gallop, murmur, or rubs.  Abdomen: BS normoactive. Soft, Nondistended, non-tender.  No masses or organomegaly. Extremities: No pretibial edema. Left foot dressing clean, dry, intact.     Assessment & Plan:  Case and plan of care discussed with Dr. Blanch Media.

## 2011-02-19 NOTE — Assessment & Plan Note (Addendum)
Lab Results  Component Value Date   HGBA1C 10.2 02/11/2011   CREATININE 0.49 01/29/2011   CHOL 150     01/27/2011   HDL 53 01/27/2011   TRIG 90 01/27/2011   ASSESSMENT: - Patient's DM is uncontrolled. However, she reports improved blood sugars after recent increase in Lantus (to 20 units) made at [facility]. Pt also does not have glucometer available today, however, reported sugars (postprandially) are not wildly out of control.   PLAN: After prolonged discussion with patient > 35 minutes face to face of counseling, and instruction, she determined she would prefer to stay with [facility] so that she doesn't have to reapply for orange card through [facility]. As such, no labs were drawn today, although pt is due for microalbumin/ creatinine and needs and eye exam. (If I order these today, they will not be covered with the patient's orange card since it is through [facility]) - Pt was recommended to start checking blood sugars at least once daily before breakfast (instead of after), preferably preprandial to each meal, and to have very close follow-up with [facility] team (within 1 month) to reevaluate BG. - Continue current insulin regimen.

## 2011-02-19 NOTE — Patient Instructions (Signed)
1) Please follow-up at the clinic in 1 month, at which time we will reevaluate your diabetes . 2) Take your insulin regularly (do not miss doses) 3) Please bring all of your medications in a bag to your next visit. 4) If you are diabetic, please bring your meter to your next visit. 5) If symptoms worsen, or new symptoms arise, please call the clinic or go to the ER. 6) Check your blood sugar at least once a day BEFORE breakfast 7) Get your eye exam as soon as possible 8) You will get labs today, if they are abnormal, you will be called.

## 2011-02-20 ENCOUNTER — Encounter: Payer: Self-pay | Admitting: Internal Medicine

## 2011-02-20 NOTE — Assessment & Plan Note (Signed)
Followed and managed at ID center, who recommend continuation of current antibiotic regimen.

## 2011-02-22 LAB — MRSA PCR SCREENING: MRSA by PCR: NEGATIVE

## 2011-02-22 LAB — CARDIAC PANEL(CRET KIN+CKTOT+MB+TROPI)
CK, MB: 1.1 ng/mL (ref 0.3–4.0)
Troponin I: 0.08 ng/mL — ABNORMAL HIGH (ref 0.00–0.06)

## 2011-02-22 LAB — GLUCOSE, CAPILLARY
Glucose-Capillary: 118 mg/dL — ABNORMAL HIGH (ref 70–99)
Glucose-Capillary: 127 mg/dL — ABNORMAL HIGH (ref 70–99)
Glucose-Capillary: 128 mg/dL — ABNORMAL HIGH (ref 70–99)
Glucose-Capillary: 147 mg/dL — ABNORMAL HIGH (ref 70–99)
Glucose-Capillary: 165 mg/dL — ABNORMAL HIGH (ref 70–99)
Glucose-Capillary: 200 mg/dL — ABNORMAL HIGH (ref 70–99)
Glucose-Capillary: 208 mg/dL — ABNORMAL HIGH (ref 70–99)
Glucose-Capillary: 210 mg/dL — ABNORMAL HIGH (ref 70–99)
Glucose-Capillary: 285 mg/dL — ABNORMAL HIGH (ref 70–99)

## 2011-02-22 LAB — BASIC METABOLIC PANEL
BUN: 1 mg/dL — ABNORMAL LOW (ref 6–23)
BUN: 2 mg/dL — ABNORMAL LOW (ref 6–23)
BUN: 3 mg/dL — ABNORMAL LOW (ref 6–23)
BUN: 3 mg/dL — ABNORMAL LOW (ref 6–23)
CO2: 23 mEq/L (ref 19–32)
CO2: 26 mEq/L (ref 19–32)
Calcium: 7.5 mg/dL — ABNORMAL LOW (ref 8.4–10.5)
Chloride: 108 mEq/L (ref 96–112)
Chloride: 109 mEq/L (ref 96–112)
Chloride: 110 mEq/L (ref 96–112)
Creatinine, Ser: 0.54 mg/dL (ref 0.4–1.2)
GFR calc non Af Amer: >60 mL/min (ref >60)
GFR calc non Af Amer: >60 mL/min (ref >60)
Glucose, Bld: 156 mg/dL — ABNORMAL HIGH (ref 70–99)
Glucose, Bld: 220 mg/dL — ABNORMAL HIGH (ref 70–99)
Glucose, Bld: 323 mg/dL — ABNORMAL HIGH (ref 70–99)
Potassium: 3.1 mEq/L — ABNORMAL LOW (ref 3.5–5.1)
Potassium: 4 mEq/L (ref 3.5–5.1)
Potassium: 4.5 mEq/L (ref 3.5–5.1)

## 2011-02-22 LAB — CBC
HCT: 25.6 % — ABNORMAL LOW (ref 36.0–46.0)
HCT: 26.4 % — ABNORMAL LOW (ref 36.0–46.0)
HCT: 26.6 % — ABNORMAL LOW (ref 36.0–46.0)
MCHC: 31.7 g/dL (ref 30.0–36.0)
MCHC: 32.9 g/dL (ref 30.0–36.0)
MCHC: 33.2 g/dL (ref 30.0–36.0)
MCV: 83.6 fL (ref 78.0–100.0)
MCV: 84.3 fL (ref 78.0–100.0)
Platelets: 431 10*3/uL — ABNORMAL HIGH (ref 150–400)
Platelets: 433 10*3/uL — ABNORMAL HIGH (ref 150–400)
Platelets: 445 10*3/uL — ABNORMAL HIGH (ref 150–400)
Platelets: 509 10*3/uL — ABNORMAL HIGH (ref 150–400)
RBC: 3.06 MIL/uL — ABNORMAL LOW (ref 3.87–5.11)
RDW: 14.7 % (ref 11.5–15.5)
RDW: 14.8 % (ref 11.5–15.5)
RDW: 15.9 % — ABNORMAL HIGH (ref 11.5–15.5)
WBC: 12.7 10*3/uL — ABNORMAL HIGH (ref 4.0–10.5)
WBC: 18.6 10*3/uL — ABNORMAL HIGH (ref 4.0–10.5)

## 2011-02-22 LAB — MAGNESIUM: Magnesium: 1.8 mg/dL (ref 1.5–2.5)

## 2011-02-22 LAB — URINE CULTURE: Culture: NO GROWTH

## 2011-02-22 LAB — LIPID PANEL
LDL Cholesterol: 61 mg/dL (ref 0–99)
Total CHOL/HDL Ratio: 5.1 RATIO
VLDL: 21 mg/dL (ref 0–40)

## 2011-02-22 LAB — POTASSIUM: Potassium: 3.6 mEq/L (ref 3.5–5.1)

## 2011-02-25 ENCOUNTER — Telehealth (INDEPENDENT_AMBULATORY_CARE_PROVIDER_SITE_OTHER): Payer: Self-pay | Admitting: Nurse Practitioner

## 2011-02-25 NOTE — Discharge Summary (Signed)
[name], [name]                 ACCOUNT NO.:  [id number]  MEDICAL RECORD NO.:  [id number]           PATIENT TYPE:  LOCATION:                                 FACILITY:  PHYSICIAN:  Blanch Media, M.D.DATE OF BIRTH:  [birth year]  DATE OF ADMISSION:  01/26/2011 DATE OF DISCHARGE:  01/30/2011                              DISCHARGE SUMMARY   DISCHARGE DIAGNOSES: 1. Diabetic foot ulcer, status post second metatarsal head amputation     with debridement. 2. Anemia of chronic disease. 3. Stage IV vaginal cancer. 4. Type 2 diabetes.  DISCHARGE MEDICATIONS: 1. Citalopram 20 mg tabs take 1 tablet by mouth daily. 2. Docusate 100 mg capsules take 1 tablet by mouth twice daily. 3. Flagyl 500 mg tabs take 1 tablet by mouth twice daily. 4. NovoLog inject 3 units subcutaneously with meals. 5. Ceftriaxone 2 g IV daily. 6. Vicodin 5/325 mg tabs take 1-2 tablets by mouth every 4 hours as     needed for pain. 7. Lantus inject 15 units subcutaneously at bedtime.  DISPOSITION AND FOLLOWUP:  The patient is scheduled to follow up with her surgeon, Dr. Turner Daniels on February 12, 2011 at 10:30 a.m., the patient is scheduled to follow up with Infectious Diseases doctor, Dr. Ninetta Lights on February 13, 2011 at 11:15 a.m.  The patient is scheduled to follow up in the Outpatient Clinic at [facility] with Dr. Saralyn Pilar on February 19, 2011 at 1:15 p.m.  The patient will then receive diabetic education from Jamison Neighbor that same day at 3 p.m.  With her appointment at Dr. Turner Daniels, her continued healing of her wounds should be monitored and management performed if the patient continues to have an open wound or progression of her wound.  The patient will continue to follow with the Infectious Disease Clinic with Dr. Ninetta Lights to assist in the management of her IV antibiotics.  The plan is currently that based on the current sensitivities, she will remain on IV ceftriaxone as well as Flagyl for 6 weeks. The  patient will follow up at the Outpatient Clinic with Dr. Marlana SalvageThad Ranger and at that time, the patient's weekly CBCs and BMET should be reviewed and her CBG is reviewed to ensure that she is having good control of her diabetes.  The patient did state to me in the hospital that she has a very difficult time affording insulin, hence I would recommend that she follow up with Jaynee Eagles to try to get patient assistance in affording this insulin.  In the meantime, it has been arranged that home health nursing will assist the patient in changing her dressings daily.  The patient will also receive assistance in administering her IV antibiotics.  PROCEDURES PERFORMED: 1. Irrigation and debridement of left foot with removal of the left     second metatarsal head was performed on January 29, 2011. 2. MRI of the left foot demonstrated progressive osteomyelitis in the     head of the second metatarsal with fluid signal intensity tracking     around the second metatarsal shaft, potentially representing an     incipient periosteal abscess.  3. Portable chest x-ray was performed on January 28, 2011, which     demonstrated a left upper extremity PICC line with the tip in the     lower SVC. 4. A portable chest x-ray was performed on January 28, 2011, which     demonstrated a left upper extremity PICC line tip near the SVC/RA     junction or within the right proximal atrium, but no pneumothorax.  CONSULTATIONS:  Orthopedic Surgery, Infectious Diseaase.  BRIEF ADMITTING HISTORY AND PHYSICAL:  This is a 45 year old woman with past medical history significant for poorly-controlled diabetes, status post amputation of the second toe, who is a direct admit from ID Clinic for a left diabetic foot ulcer.  The patient was discharged on ciprofloxacin and Bactrim on December 19, 2010 after amputation and reports that she noticed a black spot on her ankle and some peeling of her skin after the surgery that she  thought was likely due to the bandage.  About a week later, she noticed some discoloration and blistering over the lateral aspect of her foot for which she went to the ED and was sent home with doxycycline.  The patient then followed up in ID Clinic and was admitted for worsening of her left diabetic foot ulcer.  The patient had been taking all of her medications, but they had not helped.  She also reported subjective fever and chills.  The patient reports some nausea and has had vomiting a couple of times in the prior 24 hours to admission.  On admission, the patient stated that she had not been taking her insulin since the first week of February.  ALLERGIES:  No known drug allergies.  PAST MEDICAL HISTORY: 1. 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  The     patient was recently admitted for mets to the bladder in February     2012. 2. Radiation therapy was planned with an eye towards chemo treatment     by Dr. Laurette Schimke. 3. Cysto was performed by Dr. Stark Bray in February 2011 with     evacuation of clots and biopsy and fulguration. 4. 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 5. Anemia, not otherwise specified. 6. Type 2 diabetes. 7. Left second toe diabetic infection and necrosis, status post left     second toe metatarsophalangeal joint amputation on December 19, 2010.  PHYSICAL EXAMINATION:  ADMISSION VITAL SIGNS:  Temperature 100.0, pulse 110, blood pressure 114/77, respiratory rate 18, O2 sat 97% on room air. GENERAL:  The patient is in no acute distress, pleasant. EYES:  Pupils equal, round, and reactive.  Vision grossly intact. ENT:  Moist mucous membranes.  Oropharynx clear.  No erythema or exudates. NECK:  Supple.  No carotid bruits or JVD. RESPIRATORY:  CTA bilaterally.  No crackles, wheezes, or rubs. CARDIOVASCULAR:  Regular rate and  rhythm.  No murmurs, gallops, or rubs. Normal S1 and S2. GI:  Abdomen soft, nontender, nondistended. EXTREMITIES:  Left second toe was amputated.  Around 2-cm open wound draining some serosanguineous discharge, adjacent skin peeling is present, around 2-3 cm of black discoloration is present on the lateral aspect of her left foot and one black spot on the dorsum aspect of her ankle, distal pulses were present. SKIN:  No rashes. MUSCULOSKELETAL:  Moving all extremities. NEUROLOGIC:  Alert  and oriented x3.  Cranial nerves II through XII grossly intact.  HOSPITAL COURSE BY PROBLEM: 1. Left diabetic foot ulcer:  MRI was performed, which demonstrated     progressive osteomyelitis in the head of the second metatarsal as     well as possible periosteal abscess.  As a result of this,     Orthopedic Surgery was consulted and performed amputation of the     second metatarsal head with debridement and irrigation and deep     wound cultures.  The patient's wound culture did grow out abundant     group B strep as well as moderate staph aureus that was not MRSA.     The patient was initially started on vancomycin and Primaxin after     her surgery for broad coverage, and this was de-escalated to     ceftriaxone and Flagyl to cover polymicrobial infection.  Given     that the patient's wound did not grow MRSA, it was not felt that     vancomycin was needed.  Because the patient has failed prior     outpatient p.o. antibiotic treatment, IV antibiotic was elected.     Given that the patient continues to have infected bone, this will     be treated as osteomyelitis and she will receive 6 weeks of IV     antibiotic treatment with p.o. Flagyl for coverage for anaerobes.     It has been arranged for the patient to receive home health care to     assist in wound management over the following 6 weeks, as well as     to receive assistance in administering her IV antibiotics.  The     patient will follow up  in both the Infectious Disease Clinic as     well as with her orthopedic surgeon. 2. Anemia of chronic disease:  CBC on January 26, 2011 was on the low     end of normal at 12.5 on admission, and this had decreased to 11.1.     Upon discharge, this is likely secondary to mild amount of postop     blood loss as well as fluid administration.  An anemia panel was     performed back on December 18, 2010, which demonstrated that the     patient had an elevated vitamin B12 and ferritin and a normal     folate.  The patient's iron was low at 15 and her TIBC was low at     148, as such it was felt that she likely has low-grade anemia of     chronic disease.  This should continue to be followed up as an     outpatient. 3. Type 2 diabetes:  The patient's CBGs ranged from the high 100s-300     while here in the hospital, she was initially placed on sliding-     scale insulin and was recommended by the diabetic counselors here     that she be restarted on 15 units of Lantus daily on discharge.     Though the patient's CBGs were elevated, they were not extremely so     as such, I did decrease the amount of NovoLog that she uses at home     with meals and changed her dose from 6 units with meals to 3 units     with meals to try to avoid hypoglycemic events.  I did this     especially given that the patient denied having  been on any insulin     since February.  The patient states that she is unable to afford     her insulin and this is her reason for noncompliance.  Because of     this, I would recommend that the patient follow up with Jamison Neighbor     to try to get plugged into the Patient Assistance Program here or     the orange card to try to get insulin.  The patient did agree to     purchase a month's supply of insulin at this time, which will give     her time to get in to the clinic and work out her financial     situation. 4. Stage IV vaginal cancer:  This is followed by Dr. Laurette Schimke,      and the patient's last PET scan demonstrated a large mass near the     bladder, but did not demonstrate any uptake in the chest or     abdomen.  The plan at this point in time appears to be     consideration of radiation with possible chemotherapy, and when the     patient follows up with the Outpatient Clinic, it should be ensured     that she is continuing to follow with her oncologist for treatment     of her malignancy.  DISCHARGE VITAL SIGNS:  Temperature 96.8, pulse 100, respirations 16, blood pressure 136/87, O2 sat 98% on room air.     Sinda Du, MD   ______________________________ Blanch Media, M.D.   BB/MEDQ  D:  01/31/2011  T:  01/31/2011  Job:  [id number]  cc:   [facility]  Electronically Signed by Sinda Du MD on 02/11/2011 11:40:34 AM Electronically Signed by Blanch Media M.D. on 02/25/2011 02:58:35 PM

## 2011-03-03 NOTE — Progress Notes (Signed)
Summary: Needs insulin syringes  Phone Note Call from Patient   Summary of Call: PT HAS BEEN OUT OF HER INSULIN NEEDLES FOR SEVERAL DAYS  Initial call taken by: Arta Bruce,  February 25, 2011 4:06 PM  Follow-up for Phone Call        Refill sent to [facility] -- pt. received samples to hold her over until Rx filled.  Advised to check with pharmacy tomorrow.  Dutch Quint RN  February 25, 2011 4:13 PM     Prescriptions: INSULIN SYRINGES Inject Lantus once daily and Novolog three times a day as directed  #1 mo supply x 3   Entered by:   Dutch Quint RN   Authorized by:   Lehman Prom FNP   Signed by:   Dutch Quint RN on 02/25/2011   Method used:   Faxed to ...       [facility] (retail)       [address] [place], [place]  [address]       Ph: [phone]       Fax: [phone]   RxID:   [id number]

## 2011-03-04 ENCOUNTER — Encounter (HOSPITAL_BASED_OUTPATIENT_CLINIC_OR_DEPARTMENT_OTHER): Payer: Medicaid Other | Attending: Physician Assistant

## 2011-03-11 LAB — DIFFERENTIAL
Basophils Absolute: 0.1 10*3/uL (ref 0.0–0.1)
Basophils Relative: 1 % (ref 0–1)
Lymphocytes Relative: 45 % (ref 12–46)
Monocytes Absolute: 0.3 10*3/uL (ref 0.1–1.0)
Neutro Abs: 3.1 10*3/uL (ref 1.7–7.7)
Neutrophils Relative %: 46 % (ref 43–77)

## 2011-03-11 LAB — POCT I-STAT, CHEM 8
BUN: 7 mg/dL (ref 6–23)
Calcium, Ion: 1.07 mmol/L — ABNORMAL LOW (ref 1.12–1.32)
Chloride: 102 mEq/L (ref 96–112)
HCT: 42 % (ref 36.0–46.0)
Sodium: 136 mEq/L (ref 135–145)

## 2011-03-11 LAB — GLUCOSE, CAPILLARY: Glucose-Capillary: 345 mg/dL — ABNORMAL HIGH (ref 70–99)

## 2011-03-11 LAB — CBC
MCHC: 34.5 g/dL (ref 30.0–36.0)
Platelets: 324 10*3/uL (ref 150–400)
RDW: 12.8 % (ref 11.5–15.5)

## 2011-03-16 ENCOUNTER — Ambulatory Visit (INDEPENDENT_AMBULATORY_CARE_PROVIDER_SITE_OTHER): Payer: Self-pay | Admitting: Infectious Diseases

## 2011-03-16 ENCOUNTER — Encounter: Payer: Self-pay | Admitting: Infectious Diseases

## 2011-03-16 DIAGNOSIS — M86179 Other acute osteomyelitis, unspecified ankle and foot: Secondary | ICD-10-CM

## 2011-03-16 DIAGNOSIS — L97409 Non-pressure chronic ulcer of unspecified heel and midfoot with unspecified severity: Secondary | ICD-10-CM

## 2011-03-16 NOTE — Progress Notes (Signed)
  Subjective:    Patient ID: [name], female    DOB: 1966-06-18, 45 y.o.   MRN: [id number]  HPI 45 yo F from [place], with  DM, metatstic Vaginal Ca, who developed a necrotic 2nd toe with myositis of foot with systemic symptoms in January. She was treated with vancomycin and zosyn and ultimately underwent amputation of toe by Dr. Turner Daniels and was transitioned from IV antibiotics to oral cipro and bactrim and took these up until early February. By [place], she developed swelling of her foot with a new area that had blistered on lateral aspect of her foot. She was seen in the [facility] ED and given doxycycline. She was seen in ID clinic 01-26-11 and admitted to the hospital.  MRI 01-26-11 showed progressive osteomyelitis in the head of the second metatarsal, with fluid signal intensity tracking around the second metatarsal shaft potentially representing incipient periosteal abscess. She underwent  irrigation and debridement of left foot with removal of the left second metatarsal head was performed on January 29, 2011. Her Cx from OR grew MSSA and Group B strep. She was d/c home on Ceftriaxone and Flagyl. Plan for 6 weeks of antibiotics  Has wound on side of her foot and on dorsum. Has dryness on the dorsum of her foot as well. No problems with PIC line. FSG [facility] She does not have a meter.  She was seen in ID clinic on 02-13-11 with plan for her to complete 6 weeks of IV antibiotics. Completed her anbx on Saturday. She has had no problems with her PIC. Has not been checking her sugars. Last that she knows of was 200.    Review of Systems     Objective:   Physical Exam  Constitutional: She appears well-developed and well-nourished.  Musculoskeletal:       Arms:      Feet:          Assessment & Plan:

## 2011-03-16 NOTE — Assessment & Plan Note (Signed)
She is doing well. Her PIC is removed (42 cm) by  RN Tomasita Morrow. Will not give her any further anbx. She is instructed to call the clinic if she has further pain, si/sx of infection in her foot. She is instructed to call the clinic if she has any bleeding from her [facility] line site. She is asked to leave the bandage on for 24 hours prior to removal.

## 2011-03-16 NOTE — Assessment & Plan Note (Signed)
Reminded pt and family member that the most important thing in her continued health is the management of her sugars

## 2011-04-02 ENCOUNTER — Ambulatory Visit: Payer: Medicaid Other | Attending: Gynecologic Oncology | Admitting: Gynecologic Oncology

## 2011-04-10 ENCOUNTER — Ambulatory Visit: Payer: Self-pay | Admitting: Radiation Oncology

## 2011-04-14 NOTE — Consult Note (Signed)
NAMEGenella Mech NO.:  [id number]   MEDICAL RECORD NO.:  [id number]          PATIENT TYPE:  EMS   LOCATION:  MAJO                         FACILITY:  [facility]   PHYSICIAN:  Burnard Bunting, M.D.    DATE OF BIRTH:  02/24/1966   DATE OF CONSULTATION:  03/24/2009  DATE OF DISCHARGE:                                 CONSULTATION   REQUESTING PHYSICIAN:  Carleene Cooper, MD   CHIEF COMPLAINT:  Left ankle pain.   HISTORY OF PRESENT ILLNESS:  [name] is a 45 year old female  who has injured herself on Wednesday in a fall.  She injured her left  ankle.  She has not weightbearing since that time, but she reports pain  in the left ankle.  She denies any other orthopedic complaints.  She  presents now with a persistent swelling in the ankle and persistent  pain.  The pain is somewhat aching at times, sharp and shooting at  times.  She denies any other associated orthopedic injuries or any fever  and chills.   PAST MEDICAL HISTORY:  She denies any past medical history.   PAST SURGICAL HISTORY:  Notable for bilateral tubal ligation when she  was in her 42s.   ALLERGIES:  She has no known drug allergies.   MEDICATIONS:  Currently is on no medications.   SOCIAL HISTORY:  She denies any alcohol use, smoking, or IV drug abuse.   She is here with a friend.  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   PHYSICAL EXAMINATION:  GENERAL:  The patient is in mild distress and  appears comfortable, her distress is due to pain.  She is otherwise,  well developed, well nourished in no acute distress, alert and oriented.  VITAL SIGNS:  The temperature 98.2, pulse 98, respirations 20, and blood  pressure 136/86.  MUSCULOSKELETAL:  She has good cervical spine range of motion, palpable  radial pulses bilaterally.  Left lower extremity demonstrates swelling  and fracture blisters on the lateral and to some degree on the medial  aspect of her ankle.  Dorsalis pedis pulses intact.   Compartments are  soft.  She has intact sensation on dorsal and plantar aspect of the  foot.  She has tenderness to palpation along the medial and lateral  malleoli.   RADIOGRAPHIC FINDINGS:  Bimalleolar ankle fracture.  White count 6.7,  hematocrit 39, platelets 324, sodium and potassium were 36 and 3.5, and  glucose is 345.   IMPRESSION:  Bimalleolar ankle fracture 61 days old with fracture  blisters on the lateral and medial side, as well as her medical decision  making is complicated by the fact she has diabetes, also the fact that  she is non-English speaking and this entire conversation was occurred  through a Nurse, learning disability.   PLAN:  Bactroban cream plus Adaptic and a nonweightbearing posterior  splint to be applied, strict nonweightbearing, and elevation is  reinforced to the patient.  She needs to come back to the clinic in 7  days.  I am going to give her some pain medicines, also discussed  through the interpreter the risks and  benefits of surgery, which I  believe she needs for this unstable ankle fracture, risks including not  limited to infection, nonunion, malunion, need for more surgery as well  as the higher risk of infection due to her diabetes, which is going to  be treated tonight by the emergency room physician.  It is a difficult  situation for [place] because of untreated medical conditions and the  language issue.  All questions were answered.      Burnard Bunting, M.D.  Electronically Signed     GSD/MEDQ  D:  03/24/2009  T:  03/25/2009  Job:  [id number]

## 2011-04-17 NOTE — Consult Note (Signed)
[name], [name]                 ACCOUNT NO.:  [id number]   MEDICAL RECORD NO.:  [id number]          PATIENT TYPE:  OUT   LOCATION:  GYN                          [facility]ACILITY[facility]  [facility]   PHYSICIAN:  De Blanch, M.D.DATE OF BIRTH:  11-13-1966   DATE OF CONSULTATION:  09/23/2004  DATE OF DISCHARGE:                                   CONSULTATION   A 45 year old Hispanic female seen in follow-up and is accompanied by a  Engineer, structural.  Since her last visit, the patient's main complaint is  that of bilateral lower quadrant pain especially a day or two before her  menstrual period.  She continues to have regular cyclic menstrual periods  and has no intermenstrual spotting.  She has a past history of stage II  vulvar squamous cancer and has had no vulvar or inguinal symptoms and has no  trouble with leg edema.   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  Previously this had been widely excised by S. Kyra Manges, M.D.  All of her nodes were negative and she received no adjuvant therapy.   PAST MEDICAL HISTORY:  Wide local excision of vulvar lesion, right inguinal  lymphadenectomy, tubal ligation.   PAST OBSTETRICAL HISTORY:  Gravida 3.   ALLERGIES:  No known drug allergies.   CURRENT MEDICATIONS:  1.  Glucotrol.  2.  Glucophage.  3.  Pain medication p.r.n.   SOCIAL HISTORY:  The patient is single.  She does not smoke.  She works in [employer]	[place].   FAMILY HISTORY:  Negative for gynecologic, breast, or colon cancer.   REVIEW OF SYSTEMS:  Negative except as noted above.  Her previous complaint  of stress incontinence has resolved.   PHYSICAL EXAMINATION:  VITAL SIGNS:  Weight 136 pounds, blood pressure  120/78.  GENERAL:  The patient is a healthy white female in no acute distress.  HEENT:  Negative.  NECK:  Supple without thyromegaly.  There is no  supraclavicular or inguinal  adenopathy.  ABDOMEN:  Soft, nontender with no mass or organomegaly, ascites or hernias  noted.  PELVIC:  EGBUS, vagina, bladder, and urethra are normal.  Cervix and uterus  are normal.  There are no adnexal masses.  Rectovaginal examination  confirms.   IMPRESSION:  1.  Stage II vulvar cancer, no evidence of recurrent disease.  2.  Bilateral lower quadrant pain associated with her menstrual periods.      The patient is using some mild analgesics with minimal relief.   Therefore, I would suggest that she use Motrin 600 to 800 mg every six hours  as needed for pain.  We will also give her an empiric trial or birth control  pills to see whether this will suppress ovarian function and improve her  pain.  She is given a three-month prescription for Ortho-Tri-Cyclen.  She  will return to see Dr. Elana Alm in six months and return to see [place] in one  year.  She will contact us if  her pain persists.     Dani   DC/MEDQ  D:  09/23/2004  T:  09/24/2004  Job:  [id number]   cc:   S. Kyra Manges, M.D.  [phone] [address]  [place]  [place] [address]  Fax: [phone]   Fanny Dance. Rankins, M.D.  [address]  [place]  [place] [address]  Fax: [phone]   Telford Nab, R.N.  [phone] [address]  [place], [place] [address]

## 2011-04-17 NOTE — Op Note (Signed)
   NAME:  [name], [name]                           ACCOUNT NO.:  [id number]   MEDICAL RECORD NO.:  [id number]                   PATIENT TYPE:  AMB   LOCATION:  DAY                                  FACILITY:  [facility]   PHYSICIAN:  Katherine Roan, M.D.               DATE OF BIRTH:  02/18/1966   DATE OF PROCEDURE:  07/19/2003  DATE OF DISCHARGE:                                 OPERATIVE REPORT   PREOPERATIVE DIAGNOSIS:  Labial mass.   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   OPERATION/PROCEDURE:  Excision is labial mass.   DESCRIPTION OF PROCEDURE:  The patient was placed in the lithotomy position.  On exam the mass was mobile.  Was a hard, firm mass in the right labia.  A  perineal skin incision was along the labia was made of about 6 cm.  Careful  hemostasis was obtained and the lesion was excised.  It was encapsulated.  The base of the incision was then closed with interrupted sutures of 3-0 and  4-0 Vicryl.  Hemostasis appeared to be quite secure.  The blood supply to  the lesion was isolated and sutured ligated as well.  Prior to incising the  skin, I did infiltrate it with about 10 mL of 1% Xylocaine.  Closed the skin  with a subcuticular 4-0 Vicryl.  We placed two sutures inside in the vagina  of 0 chromic for hemostasis.  There appeared to be no evidence of hematoma.  I catheterized the bladder and drained about 700 mL of urine from the  bladder.  Mrs. Cephus Shelling tolerated the procedure well.  Frozen section revealed  malignancy of uncertain etiology.  She was given Percocet and Keflex and  will return to my office in about one week.                                               Katherine Roan, M.D.    SDM/MEDQ  D:  07/19/2003  T:  07/19/2003  Job:  [id number]

## 2011-04-17 NOTE — Consult Note (Signed)
NAME:  [name], [name]                           ACCOUNT NO.:  [id number]   MEDICAL RECORD NO.:  [id number]                   PATIENT TYPE:  OUT   LOCATION:  GYN                                  FACILITY:  [facility]   PHYSICIAN:  De Blanch, M.D.         DATE OF BIRTH:  October 16, 1966   DATE OF CONSULTATION:  DATE OF DISCHARGE:                                   CONSULTATION   CHIEF COMPLAINT:  Vulvar cancer.   INTERVAL HISTORY:  Since her last visit the patient has done well. She  denies any GI or GU symptoms. She does complain of a bulging in her vaginal  area. She does not notice any bleeding and has no evidence of lymphedema.   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 She had previously had a modified radical excision  by Dr. Katherine Roan.  All of her lymph nodes were negative and no  adjuvant therapy was required.   PAST SURGICAL HISTORY:  1. Vulvar excision.  2. Tubal ligation.   OBSTETRIC HISTORY:  Gravida 3.   DRUG ALLERGIES:  None.   CURRENT MEDICATIONS:  Glucotrol and Glucophage.   SOCIAL HISTORY:  The patient is single. She does not smoke. She works in [employer]	[place].   FAMILY HISTORY:  Negative for gynecologic, breast, or colon cancer.   REVIEW OF SYSTEMS:  Negative except as noted above. She denies any stress  incontinence.   PHYSICAL EXAMINATION:  VITAL SIGNS: Weight 137 pounds, blood pressure  135/70.  GENERAL: The patient is a healthy white female who is in no acute distress.  HEENT: Negative.  NECK: Supple without thyromegaly. There is no supraclavicular or inguinal  adenopathy. The inguinal incision is well healed. There is no evidence of  lymphedema.  PELVIC EXAM: EGBUS shows the vulva is normal. No lesions are noted. She does  have a second to third degree cystourethrocele.  The cervix is normal. The  uterus is anterior and  normal in shape, size, and consistency.  No adnexal  masses are noted. Rectovaginal exam confirms.   IMPRESSION:  1. 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  2. Symptomatic cystocele and urethrocele in that the patient notices it. She     has not had any stress incontinence or     any bleeding or discharge. I explained to her that this could be     corrected surgically and at this juncture she does not feel that it is     necessary.   The patient will return to [facility] in three months for continuing follow-up.  De Blanch, M.D.    DC/MEDQ  D:  02/27/2004  T:  02/27/2004  Job:  [id number]   cc:   S. Kyra Manges, M.D.  [phone] [address]  [place]  [place] [address]  Fax: [phone]   Telford Nab, R.N.  [address]  [place], [place] [address]

## 2011-04-17 NOTE — Op Note (Signed)
NAME:  [name], [name]                           ACCOUNT NO.:  [id number]   MEDICAL RECORD NO.:  [id number]                   PATIENT TYPE:  AMB   LOCATION:  DAY                                  FACILITY:  [facility]   PHYSICIAN:  De Blanch, M.D.         DATE OF BIRTH:  Jul 11, 1966   DATE OF PROCEDURE:  10/02/2003  DATE OF DISCHARGE:                                 OPERATIVE REPORT   PREOPERATIVE DIAGNOSIS:  Right vulvar carcinoma, status post wide excision.   POSTOPERATIVE DIAGNOSIS:  Right vulvar carcinoma, status post wide excision.   PROCEDURE:  Right inguinal lymphadenectomy.   SURGEON:  Daniel L. Clarke-Pearson, M.D.   ASSISTANT:  Telford Nab, R.N.   ANESTHESIA:  General with mask/LMA.   ESTIMATED BLOOD LOSS:  5 mL.   SURGICAL FINDINGS:  Examination of the vulva showed a well-healed vulvar  excisional site.  No other lesions were noted.  There were no palpably  enlarged inguinal lymph nodes.  At the time of lymphadenectomy, there was  one lymph node medial to the saphenous vein which was slightly enlarged and  firm.   DESCRIPTION OF PROCEDURE:  The patient was brought to the operating room and  after satisfactory attainment of general anesthesia, was placed supine on  the operating room table.  The anterior abdominal wall, inguinal region, and  vulva were prepped with Betadine and the bladder emptied with a straight  catheterization.  A sample was submitted for culture and sensitivity given a  preoperative urine specimen that appeared abnormal.  The patient was draped.  A right inguinal incision was created down to and through Camper's fascia.  Camper's fascia was then undermined until the anterior abdominal wall fascia  was identified.  In a similar fashion, the Camper's fascia was mobilized  caudad.  An incision was carried down to the anterior abdominal wall rectus  fascia and then caudad to the inguinal ligament.  Laterally, the tensor  fascia lata fascia  was identified.  Medially, the pubic tubercle was  identified and then the adductor longus muscle identified.  The specimen was  then excised en bloc moving from lateral to medial, dissecting along the  cribriform fascia.  Small branches of the saphenous vein were identified,  clamped, and free tied using 2-0 Vicryl suture.  The femoral artery was  palpated, and care was taken not enter the femoral sheath.  Dissection was  carried medially until the entire specimen was mobilized, and then this is  submitted to permanent section.  The surgical site was irrigated with  saline, hemostasis achieved with cautery.  A Blake drain was placed through  a stab incision in the right lower quadrant of the abdomen and secured to  the skin with 2-0 silk.  Subcutaneous tissue was reapproximated with 2-0  Vicryl and skin was closed with skin staples.  Dressings applied.  The  patient was awakened from anesthesia and taken to the recovery  room in  satisfactory condition.  Sponge, needle, and instrument counts correct x 2.                                               De Blanch, M.D.    DC/MEDQ  D:  10/02/2003  T:  10/02/2003  Job:  [id number]   cc:   S. Kyra Manges, M.D.  [phone] [address]  [place]  [place] [address]  Fax: [phone]   Telford Nab, R.N.  [address]  [place], [place] [address]   Fanny Dance. Rankins, M.D.  [address]  [place]  [place] [address]  Fax: [phone]

## 2011-04-17 NOTE — Consult Note (Signed)
NAME:  [name], [name]                           ACCOUNT NO.:  [id number]   MEDICAL RECORD NO.:  [id number]                   PATIENT TYPE:  OUT   LOCATION:  GYN                                  FACILITY:  [facility]   PHYSICIAN:  De Blanch, M.D.         DATE OF BIRTH:  03/07/1966   DATE OF CONSULTATION:  09/25/2003  DATE OF DISCHARGE:                                   CONSULTATION   HISTORY OF PRESENT ILLNESS:  This is a 45 year old female seen in  consultation at the request of Dr. Kyra Manges.  The patient was found to  have a right vulvar lesion, and she underwent wide excision of the vulvar  mass on July 19, 2003.  Final pathology showed this to be a moderately  differentiated invasive squamous cell carcinoma arising near the Bartholin  gland.  A deep surgical margin labeled labial fascia showed no evidence of  tumor.  There was a 5 mm margin around the primary excision.  The patient  has had an uncomplicated postoperative recovery.   PAST MEDICAL HISTORY:  Medical illnesses:  Diabetes.   PAST SURGICAL HISTORY:  1. Vulvar excision.  2. Tubal ligation.   OBSTETRICAL HISTORY:  Gravida 3.   DRUG ALLERGIES:  None.   MEDICATIONS:  For diabetes, Glucotrol and Glucophage.   SOCIAL HISTORY:  The patient is single.  She comes accompanied by her adult  son today.  She does not smoke.  She works in [employer].   FAMILY HISTORY:  Negative for gynecologic, breast, or colon cancer.   REVIEW OF SYSTEMS:  Otherwise negative.   PHYSICAL EXAMINATION:  VITAL SIGNS:  Weight 129 pounds, height 5 feet 3.  Blood pressure 120/72, pulse 72, respiratory rate 18.  GENERAL:  The patient is a healthy Hispanic female in no acute distress.  She is interviewed using a Adult nurse from [facility].  HEENT:  Negative.  NECK:  Supple.  Without thyromegaly.  There is no supraclavicular or  inguinal adenopathy.  ABDOMEN:  Soft, nontender.  No masses, organomegaly, ascites,  or hernias are  noted.  PELVIC:  EGBUS, vagina, bladder, urethra are normal.  The vulvar excision  site is well healed.  No new lesions are noted.  Palpation of the left labia  majora and Bartholin area revealed no nodularity.  The vagina is clean.  The  cervix is normal.  The uterus is anterior, normal shape, size, and  consistency.  There are no adnexal masses noted.  RECTOVAGINAL:  Confirms.   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  The patient seems to have recovered nicely from her excisional procedure,  and we would now recommend she undergo a right inguinal lymphadenectomy to  be certain that she has no evidence of metastatic disease.  This is arranged  for October 09, 2003, in conjunction with Dr. Kyra Manges.  The risks of  surgery  including hemorrhage, infection, injury to adjacent viscera,  thromboembolic complications, lymphocysts, and excessive drainage from an  inguinal drain were discussed with the patient and her son through the  translator.  All their questions are answered.                                               De Blanch, M.D.    DC/MEDQ  D:  09/25/2003  T:  09/25/2003  Job:  [id number]   cc:   S. Kyra Manges, M.D.  [phone] [address]  [place]  [place] [address]  Fax: [phone]   Telford Nab, R.N.  [address]  [place], [place] [address]

## 2011-04-17 NOTE — Consult Note (Signed)
NAME:  [name], [name]                           ACCOUNT NO.:  [id number]   MEDICAL RECORD NO.:  [id number]                   PATIENT TYPE:  OUT   LOCATION:  GYN                                  FACILITY:  [facility]   PHYSICIAN:  De Blanch, M.D.         DATE OF BIRTH:  [birth year]   DATE OF CONSULTATION:  11/13/2003  DATE OF DISCHARGE:                                   CONSULTATION   HISTORY:  A 45 year old Hispanic female returns having undergone a right  inguinal lymphadenectomy on October 02, 2003 and previously a modified  radical excision by S. Kyra Manges, M.D. for what turned out to be a stage  II squamous cell carcinoma of the vulva.  Her inguinal nodes were negative.   Since her last visit her only complaint is that of some vulvar irritation.  She has been using the cream which seems to help her.  She denies any  vaginal bleeding.  She has no edema of the lower extremity and no discomfort  in her inguinal incision.  Overall, her functional status is good.  She has  no other GI or GU symptoms.  She comes accompanied by her son today who does  most of the translating for her.   PHYSICAL EXAMINATION:  VITAL SIGNS:  Weight 137 pounds, blood pressure  105/70.  GENERAL:  The patient is a healthy Hispanic female in no acute distress.  HEENT:  Negative.  NECK:  Supple without thyromegaly.  ABDOMEN:  Soft, nontender.  No mass, organomegaly, ascites, or hernias  noted.  Her right inguinal incision is well healed.  No lymphocysts are  noted.  EXTREMITIES:  Lower extremities are without edema or varicosities.  PELVIC:  EGBUS appears normal except for some excoriations from scratching.  This looks like it is chronically inflamed.  There is no evidence of any  malignancy.  The vulvectomy site is well healed.  The vagina is otherwise  clean.   IMPRESSION:  Stage II vulvar cancer.  No evidence of recurrent disease.   Vulvitis.  The patient does not have many resources and I would  suggest she  use hydrocortisone cream obtained over-the-counter combined with generic  brand of Monistat.  She will try this for the next week and report if she  has any worsening of symptoms and hopefully she will see improvement.  We  will have her return in three months for another examination or as needed.                                               De Blanch, M.D.    DC/MEDQ  D:  11/13/2003  T:  11/13/2003  Job:  [id number]   cc:   S. Kyra Manges, M.D.  [phone] [address]  [place]  [place] [address]  Fax: [phone]   Telford Nab, R.N.  [address]  [place], [place] [address]

## 2011-05-07 ENCOUNTER — Ambulatory Visit: Payer: Self-pay | Attending: Gynecologic Oncology | Admitting: Gynecologic Oncology

## 2011-05-07 ENCOUNTER — Ambulatory Visit: Payer: Self-pay | Admitting: Gynecologic Oncology

## 2011-05-07 DIAGNOSIS — C519 Malignant neoplasm of vulva, unspecified: Secondary | ICD-10-CM | POA: Insufficient documentation

## 2011-05-07 DIAGNOSIS — C539 Malignant neoplasm of cervix uteri, unspecified: Secondary | ICD-10-CM | POA: Insufficient documentation

## 2011-05-08 NOTE — Consult Note (Signed)
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  HISTORY OF PRESENT ILLNESS:  This is a 45 year old who underwent a radical wide local excision of the right vulva and right inguinal lymph node dissection for stage II vulvar cancer in 2005.  She has been without evidence of disease of that cancer since.  She has, however, lost to follow up and presented in the hospital in March 2011, with inability to void, vaginal bleeding, and weight loss.  Physical examination demonstrated presence of 11.5 x 5.8 x 10.5 mass involving the vagina extending into the anterior bladder.  A bladder biopsy was positive for infiltrative squamous cell carcinoma, presumptive etiology of the cervix.  She received chemoradiation and only received 30 Gy in 12 fractions with concurrent cisplatin therapy with a complete response.  The note from Dr. Clelia Croft dated October 03, 2010, is appreciated.  It is also evident that she saw a PET scan was obtained on February 07, 2011, which demonstrated a slightly enlarged left inguinal lymph node with FGD uptake, SUVmax was 2.6.  [name] has been completely noncompliant with her visits.  Visits had been scheduled with an interpreter and she has not presented, with approximately 4 consecutive missed appointments with GYN Oncology.  She presents today May 07, 2011.  PAST MEDICAL HISTORY: 1. Stage II vulvar cancer, diagnosed in 2005. 2. Stage IV cervical cancer, diagnosed in 2011. 3. Insulin-dependent diabetes mellitus.  PAST SURGICAL HISTORY:  No interval changes.  REVIEW OF SYSTEMS:  No nausea,  vomiting, fever, chills.  No changes in bowel or rectal habits.  No shortness of breath, headache, chest pain.  SOCIAL HISTORY:  No changes.  PHYSICAL EXAMINATION:  GENERAL:  Well-developed female, in no acute distress. VITAL SIGNS:  Weight 131 pounds, blood pressure 100/58, temperature 98.6, pulse 64, respiratory rate 16. CHEST:  Clear to auscultation. LYMPH NODE SURVEY:  No cervical, supraclavicular, or inguinal adenopathy. ABDOMEN:  Soft, nontender. PELVIC:  Normal external genitalia, Bartholin, urethra, and Skene.  The cervix is visualized.  There is no parametrial nodularity.  No firmness. No stranding.  Uterus is mobile.  No pelvic masses appreciated.  No rectovaginal septum nodularity.  IMPRESSION:  Stage II vulvar cancer.  No evidence of disease, presumed cured stage IV cervical cancer.  The patient received a total of 39 cGy, completed on June 04, 2010 with concurrent cisplatin, with no evidence of disease on physical examination.  The findings of the PET scan of March 2012 are worrisome.   Ms Cephus Shelling has been noncompliant with multiple attempts to schedule an  appointment in GYN Oncology to follow-up on this finding.  A long discussion  was had with [name] and her significant other regarding her noncompliance and her presentation for care  only when emergencies occur.  She states that she forgets about the appointment and of note, for the record, these appointments reminders are made by the help of interpreter, Raynelle Fanning. Ms.Ochoa was asked if there is another place she would rather receive her care and she states that she will strive to remember her followup appointments and be more compliant.  We will schedule the patient for  CT-guided biopsy of this area.  Her disposition will be made pending  the biopsy results.     Laurette Schimke, MD     WB/MEDQ  D:  05/07/2011  T:  05/08/2011  Job:  [id number]  cc:   Benjiman Core, M.D. Fax: [phone]  Lurline Hare,  M.D.  Telford Nab, R.N. [address] [place], [place] [address]  Electronically Signed by Laurette Schimke MD on 05/08/2011 10:18:54 AM

## 2011-05-11 ENCOUNTER — Other Ambulatory Visit: Payer: Self-pay | Admitting: Gynecologic Oncology

## 2011-05-11 DIAGNOSIS — R599 Enlarged lymph nodes, unspecified: Secondary | ICD-10-CM

## 2011-05-20 ENCOUNTER — Other Ambulatory Visit: Payer: Self-pay | Admitting: Gynecologic Oncology

## 2011-05-20 ENCOUNTER — Ambulatory Visit (HOSPITAL_COMMUNITY): Payer: Self-pay

## 2011-05-20 ENCOUNTER — Ambulatory Visit (HOSPITAL_COMMUNITY)
Admission: RE | Admit: 2011-05-20 | Discharge: 2011-05-20 | Disposition: A | Discharge status: Home / Self Care | Payer: Self-pay | Source: Ambulatory Visit | Attending: Gynecologic Oncology | Admitting: Gynecologic Oncology

## 2011-05-20 ENCOUNTER — Other Ambulatory Visit: Payer: Self-pay | Admitting: Interventional Radiology

## 2011-05-20 DIAGNOSIS — Z794 Long term (current) use of insulin: Secondary | ICD-10-CM | POA: Insufficient documentation

## 2011-05-20 DIAGNOSIS — R599 Enlarged lymph nodes, unspecified: Secondary | ICD-10-CM | POA: Insufficient documentation

## 2011-05-20 DIAGNOSIS — E119 Type 2 diabetes mellitus without complications: Secondary | ICD-10-CM | POA: Insufficient documentation

## 2011-05-20 LAB — GLUCOSE, CAPILLARY: Glucose-Capillary: 190 mg/dL — ABNORMAL HIGH (ref 70–99)

## 2011-05-20 LAB — CBC
HCT: 35 % — ABNORMAL LOW (ref 36.0–46.0)
Hemoglobin: 12.3 g/dL (ref 12.0–15.0)
MCH: 29.5 pg (ref 26.0–34.0)
MCHC: 35.1 g/dL (ref 30.0–36.0)

## 2011-05-21 ENCOUNTER — Encounter (HOSPITAL_BASED_OUTPATIENT_CLINIC_OR_DEPARTMENT_OTHER): Payer: Self-pay | Admitting: Oncology

## 2011-05-21 ENCOUNTER — Other Ambulatory Visit: Payer: Self-pay | Admitting: Oncology

## 2011-05-21 DIAGNOSIS — Z8544 Personal history of malignant neoplasm of other female genital organs: Secondary | ICD-10-CM

## 2011-05-21 DIAGNOSIS — C673 Malignant neoplasm of anterior wall of bladder: Secondary | ICD-10-CM

## 2011-05-21 LAB — CBC WITH DIFFERENTIAL/PLATELET
BASO%: 0.7 % (ref 0.0–2.0)
EOS%: 5.8 % (ref 0.0–7.0)
Eosinophils Absolute: 0.2 10*3/uL (ref 0.0–0.5)
HCT: 36.7 % (ref 34.8–46.6)
LYMPH%: 38.7 % (ref 14.0–49.7)
MONO#: 0.3 10*3/uL (ref 0.1–0.9)
MONO%: 7.4 % (ref 0.0–14.0)
NEUT#: 1.9 10*3/uL (ref 1.5–6.5)
Platelets: 307 10*3/uL (ref 145–400)
RBC: 4.18 10*6/uL (ref 3.70–5.45)
WBC: 4 10*3/uL (ref 3.9–10.3)
lymph#: 1.5 10*3/uL (ref 0.9–3.3)

## 2011-05-21 LAB — COMPREHENSIVE METABOLIC PANEL
AST: 13 U/L (ref 0–37)
Alkaline Phosphatase: 126 U/L — ABNORMAL HIGH (ref 39–117)
BUN: 18 mg/dL (ref 6–23)
Creatinine, Ser: 0.64 mg/dL (ref 0.50–1.10)
Potassium: 4.1 mEq/L (ref 3.5–5.3)

## 2011-10-26 ENCOUNTER — Encounter: Payer: Self-pay | Admitting: *Deleted

## 2011-10-26 ENCOUNTER — Emergency Department (HOSPITAL_COMMUNITY): Payer: Self-pay

## 2011-10-26 ENCOUNTER — Emergency Department (HOSPITAL_COMMUNITY)
Admission: EM | Admit: 2011-10-26 | Discharge: 2011-10-26 | Disposition: A | Discharge status: Home / Self Care | Payer: Self-pay | Attending: Emergency Medicine | Admitting: Emergency Medicine

## 2011-10-26 DIAGNOSIS — M79609 Pain in unspecified limb: Secondary | ICD-10-CM | POA: Insufficient documentation

## 2011-10-26 DIAGNOSIS — Z9889 Other specified postprocedural states: Secondary | ICD-10-CM | POA: Insufficient documentation

## 2011-10-26 DIAGNOSIS — M7989 Other specified soft tissue disorders: Secondary | ICD-10-CM | POA: Insufficient documentation

## 2011-10-26 DIAGNOSIS — E119 Type 2 diabetes mellitus without complications: Secondary | ICD-10-CM | POA: Insufficient documentation

## 2011-10-26 DIAGNOSIS — X58XXXA Exposure to other specified factors, initial encounter: Secondary | ICD-10-CM | POA: Insufficient documentation

## 2011-10-26 DIAGNOSIS — L089 Local infection of the skin and subcutaneous tissue, unspecified: Secondary | ICD-10-CM | POA: Insufficient documentation

## 2011-10-26 DIAGNOSIS — S91309A Unspecified open wound, unspecified foot, initial encounter: Secondary | ICD-10-CM | POA: Insufficient documentation

## 2011-10-26 MED ORDER — TETANUS-DIPHTH-ACELL PERTUSSIS 5-2.5-18.5 LF-MCG/0.5 IM SUSP
0.5000 mL | Freq: Once | INTRAMUSCULAR | Status: AC
  Administered 2011-10-26: 0.5 mL via INTRAMUSCULAR
  Filled 2011-10-26: qty 0.5

## 2011-10-26 MED ORDER — OXYCODONE-ACETAMINOPHEN 5-325 MG PO TABS: 2.0000 | ORAL_TABLET | ORAL | Status: AC | PRN

## 2011-10-26 MED ORDER — OXYCODONE-ACETAMINOPHEN 5-325 MG PO TABS
1.0000 | ORAL_TABLET | Freq: Once | ORAL | Status: AC
  Administered 2011-10-26: 1 via ORAL
  Filled 2011-10-26: qty 1

## 2011-10-26 MED ORDER — CLINDAMYCIN HCL 150 MG PO CAPS: 150.0000 mg | ORAL_CAPSULE | Freq: Four times a day (QID) | ORAL | Status: AC

## 2011-10-26 MED ORDER — CIPROFLOXACIN HCL 500 MG PO TABS: 500.0000 mg | ORAL_TABLET | Freq: Two times a day (BID) | ORAL | Status: AC

## 2011-10-26 NOTE — ED Provider Notes (Signed)
History     CSN: [id number] Arrival date & time: 10/26/2011 10:18 AM   First MD Initiated Contact with Patient 10/26/11 1153      Chief Complaint  Patient presents with  . Foot Pain     Patient is a 45 y.o. female presenting with lower extremity pain. The history is provided by the patient. A language interpreter was used Conservation officer, historic buildings).  Foot Pain This is a new problem. The current episode started more than 2 days ago. The problem occurs constantly. The problem has not changed since onset.Pertinent negatives include no chest pain, no abdominal pain and no shortness of breath. The symptoms are aggravated by nothing. The symptoms are relieved by nothing.  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 Diabetes mellitus     History reviewed. No pertinent past surgical history.  History reviewed. No pertinent family history.  History  Substance Use Topics  . Smoking status: Not on file  . Smokeless tobacco: Not on file  . Alcohol Use: No    OB History    Grav Para Term Preterm Abortions TAB SAB Ect Mult Living                  Review of Systems  Respiratory: Negative for shortness of breath.   Cardiovascular: Negative for chest pain.  Gastrointestinal: Negative for abdominal pain.  All other systems reviewed and are negative.    Allergies  Review of patient's allergies indicates no known allergies.  Home Medications  No current outpatient prescriptions on file.  BP 97/63  Pulse 100  Temp 98.5 F (36.9 C)  Resp 20  SpO2 99%  Physical Exam  CONSTITUTIONAL: Well developed/well nourished HEAD AND FACE: Normocephalic/atraumatic EYES: EOMI/PERRL ENMT: Mucous membranes moist NECK: supple no meningeal signs SPINE:entire spine nontender CV: S1/S2 noted, no murmurs/rubs/gallops noted LUNGS: Lungs are clear to auscultation  bilaterally, no apparent distress ABDOMEN: soft, nontender, no rebound or guarding NEURO: Pt is awake/alert, moves all extremitiesx4 EXTREMITIES: pulses normal, full ROM Wound noted on plantar surface of left foot, no active bleeding/drainage.  No bone exposed Minimal surrounding erythema No edema noted.  No crepitance noted.  Full ROM of left foot without tenderness.  No erythematous streaking. 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 SKIN: warm, color normal PSYCH: no abnormalities of mood noted   ED Course  Procedures   Results for orders placed during the hospital encounter of 03/24/09  GLUCOSE, CAPILLARY      Component Value Range   Glucose-Capillary 345 (*) 70 - 99 (mg/dL)  CBC      Component Value Range   WBC 6.7  4.0 - 10.5 (K/uL)   RBC 4.40  3.87 - 5.11 (MIL/uL)   Hemoglobin 13.6  12.0 - 15.0 (g/dL)   HCT 40.9  81.1 - 91.4 (%)   MCV 89.4  78.0 - 100.0 (fL)   MCHC 34.5  30.0 - 36.0 (g/dL)   RDW 78.2  95.6 - 21.3 (%)   Platelets 324  150 - 400 (K/uL)  DIFFERENTIAL      Component Value Range   Neutrophils Relative 46  43 - 77 (%)   Neutro Abs 3.1  1.7 - 7.7 (K/uL)   Lymphocytes Relative 45  12 - 46 (%)   Lymphs Abs 3.0  0.7 - 4.0 (K/uL)   Monocytes Relative 4  3 -  12 (%)   Monocytes Absolute 0.3  0.1 - 1.0 (K/uL)   Eosinophils Relative 4  0 - 5 (%)   Eosinophils Absolute 0.3  0.0 - 0.7 (K/uL)   Basophils Relative 1  0 - 1 (%)   Basophils Absolute 0.1  0.0 - 0.1 (K/uL)  POCT I-STAT, CHEM 8      Component Value Range   Sodium 136  135 - 145 (mEq/L)   Potassium 3.5  3.5 - 5.1 (mEq/L)   Chloride 102  96 - 112 (mEq/L)   BUN 7  6 - 23 (mg/dL)   Creatinine, Ser 0.5  0.4 - 1.2 (mg/dL)   Glucose, Bld 829 (*) 70 - 99 (mg/dL)   Calcium, Ion 5.62 (*) 1.12 - 1.32 (mmol/L)   TCO2 23  0 - 100 (mmol/L)   Hemoglobin 14.3  12.0 - 15.0 (g/dL)   HCT 13.0  86.5 - 78.4 (%)   Dg Foot Complete Left  10/26/2011  *RADIOLOGY REPORT*  Clinical Data: Left foot pain and  swelling.  Open wound on the plantar aspect of the great toe.  LEFT FOOT - COMPLETE 3+ VIEW  Comparison: None.  Findings: There are surgical changes from a prior second toe indication which includes the distal second metatarsal.  The joint spaces are maintained.  No destructive bony changes to suggest osteomyelitis.  IMPRESSION:  1.  Remote postsurgical changes. 2.  No acute bony findings or destructive bony changes.  Original Report Authenticated By: P. Loralie Champagne, M.D.       MDM  Nursing notes reviewed and considered in documentation xrays reviewed and considered   Pt well appearing, ambulatory Has wound but no significant drainage/erythema.  No significant tenderness.  I doubt osteo Advised warm soaks, abx started and will need wound check in 48 hrs Pt understands this as this as discussed via interpreter        Joya Gaskins, MD 10/26/11 [id number]

## 2011-10-26 NOTE — ED Notes (Signed)
Pt reports left foot pain, had toe amputated 8 months ago. Ambulatory at triage.

## 2011-12-21 ENCOUNTER — Emergency Department (HOSPITAL_COMMUNITY)
Admission: EM | Admit: 2011-12-21 | Discharge: 2011-12-21 | Disposition: A | Discharge status: Home / Self Care | Payer: Self-pay | Attending: Emergency Medicine | Admitting: Emergency Medicine

## 2011-12-21 ENCOUNTER — Encounter (HOSPITAL_COMMUNITY): Payer: Self-pay | Admitting: Family Medicine

## 2011-12-21 DIAGNOSIS — L97309 Non-pressure chronic ulcer of unspecified ankle with unspecified severity: Secondary | ICD-10-CM | POA: Insufficient documentation

## 2011-12-21 DIAGNOSIS — Z794 Long term (current) use of insulin: Secondary | ICD-10-CM | POA: Insufficient documentation

## 2011-12-21 DIAGNOSIS — E119 Type 2 diabetes mellitus without complications: Secondary | ICD-10-CM | POA: Insufficient documentation

## 2011-12-21 DIAGNOSIS — L98499 Non-pressure chronic ulcer of skin of other sites with unspecified severity: Secondary | ICD-10-CM

## 2011-12-21 LAB — GLUCOSE, CAPILLARY: Glucose-Capillary: 372 mg/dL — ABNORMAL HIGH (ref 70–99)

## 2011-12-21 MED ORDER — SILVER SULFADIAZINE 1 % EX CREA: TOPICAL_CREAM | Freq: Every day | CUTANEOUS | Status: DC

## 2011-12-21 NOTE — ED Notes (Signed)
Pt reports ulcer to left ankle and top of right foot x2 weeks. History of diabetes.

## 2011-12-21 NOTE — ED Provider Notes (Signed)
History     CSN: [id number]  Arrival date & time 12/21/11  1245   First MD Initiated Contact with Patient 12/21/11 1348      Chief Complaint  Patient presents with  . Skin Ulcer     HPI Pt reports ulcer to left ankle and top of right foot x2 weeks. History of diabetes.  Past Medical History  Diagnosis Date  . Diabetes mellitus     History reviewed. No pertinent past surgical history.  History reviewed. No pertinent family history.  History  Substance Use Topics  . Smoking status: Not on file  . Smokeless tobacco: Not on file  . Alcohol Use: No    OB History    Grav Para Term Preterm Abortions TAB SAB Ect Mult Living                  Review of Systems  All other systems reviewed and are negative.    Allergies  Review of patient's allergies indicates no known allergies.  Home Medications   Current Outpatient Rx  Name Route Sig Dispense Refill  . INSULIN ASPART 100 UNIT/ML Gardner SOLN Subcutaneous Inject 3 Units into the skin 3 (three) times daily before meals.    . INSULIN GLARGINE 100 UNIT/ML [place] SOLN Subcutaneous Inject 15 Units into the skin at bedtime.    Marland Kitchen SILVER SULFADIAZINE 1 % EX CREA Topical Apply topically daily. 50 g 0    BP 110/72  Pulse 101  Temp(Src) 99 F (37.2 C) (Oral)  Resp 18  SpO2 98%  Physical Exam  Nursing note and vitals reviewed. Constitutional: She is oriented to person, place, and time. She appears well-developed and well-nourished. No distress.  HENT:  Head: Normocephalic and atraumatic.  Eyes: Pupils are equal, round, and reactive to light.  Neck: Normal range of motion.  Cardiovascular: Normal rate and intact distal pulses.   Pulmonary/Chest: No respiratory distress.  Abdominal: Normal appearance. She exhibits no distension.  Musculoskeletal: Normal range of motion.       Feet:  Neurological: She is alert and oriented to person, place, and time. No cranial nerve deficit.  Skin: Skin is warm and dry. No rash noted.    Psychiatric: She has a normal mood and affect. Her behavior is normal.    ED Course  Procedures (including critical care time)  Labs Reviewed  GLUCOSE, CAPILLARY - Abnormal; Notable for the following:    Glucose-Capillary 372 (*)    All other components within normal limits  POCT CBG MONITORING   No results found.   1. Skin ulcer       MDM         Nelia Shi, MD 12/21/11 2017

## 2012-04-09 ENCOUNTER — Emergency Department (HOSPITAL_COMMUNITY): Payer: Self-pay

## 2012-04-09 ENCOUNTER — Inpatient Hospital Stay (HOSPITAL_COMMUNITY)
Admission: EM | Admit: 2012-04-09 | Discharge: 2012-04-14 | DRG: 638 | Disposition: A | Discharge status: Home / Self Care | Payer: Self-pay | Attending: Internal Medicine | Admitting: Internal Medicine

## 2012-04-09 ENCOUNTER — Encounter (HOSPITAL_COMMUNITY): Payer: Self-pay | Admitting: *Deleted

## 2012-04-09 DIAGNOSIS — L97509 Non-pressure chronic ulcer of other part of unspecified foot with unspecified severity: Secondary | ICD-10-CM | POA: Diagnosis present

## 2012-04-09 DIAGNOSIS — D630 Anemia in neoplastic disease: Secondary | ICD-10-CM | POA: Diagnosis present

## 2012-04-09 DIAGNOSIS — M869 Osteomyelitis, unspecified: Secondary | ICD-10-CM

## 2012-04-09 DIAGNOSIS — Z794 Long term (current) use of insulin: Secondary | ICD-10-CM

## 2012-04-09 DIAGNOSIS — E1169 Type 2 diabetes mellitus with other specified complication: Principal | ICD-10-CM | POA: Diagnosis present

## 2012-04-09 DIAGNOSIS — I798 Other disorders of arteries, arterioles and capillaries in diseases classified elsewhere: Secondary | ICD-10-CM | POA: Diagnosis present

## 2012-04-09 DIAGNOSIS — M86179 Other acute osteomyelitis, unspecified ankle and foot: Secondary | ICD-10-CM

## 2012-04-09 DIAGNOSIS — D649 Anemia, unspecified: Secondary | ICD-10-CM

## 2012-04-09 DIAGNOSIS — R739 Hyperglycemia, unspecified: Secondary | ICD-10-CM

## 2012-04-09 DIAGNOSIS — L97409 Non-pressure chronic ulcer of unspecified heel and midfoot with unspecified severity: Secondary | ICD-10-CM | POA: Diagnosis present

## 2012-04-09 DIAGNOSIS — L97909 Non-pressure chronic ulcer of unspecified part of unspecified lower leg with unspecified severity: Secondary | ICD-10-CM

## 2012-04-09 DIAGNOSIS — E1159 Type 2 diabetes mellitus with other circulatory complications: Secondary | ICD-10-CM | POA: Diagnosis present

## 2012-04-09 DIAGNOSIS — C7919 Secondary malignant neoplasm of other urinary organs: Secondary | ICD-10-CM | POA: Diagnosis present

## 2012-04-09 DIAGNOSIS — L97309 Non-pressure chronic ulcer of unspecified ankle with unspecified severity: Secondary | ICD-10-CM

## 2012-04-09 DIAGNOSIS — E782 Mixed hyperlipidemia: Secondary | ICD-10-CM

## 2012-04-09 DIAGNOSIS — E118 Type 2 diabetes mellitus with unspecified complications: Secondary | ICD-10-CM

## 2012-04-09 DIAGNOSIS — E119 Type 2 diabetes mellitus without complications: Secondary | ICD-10-CM | POA: Diagnosis present

## 2012-04-09 DIAGNOSIS — E1165 Type 2 diabetes mellitus with hyperglycemia: Secondary | ICD-10-CM

## 2012-04-09 DIAGNOSIS — S98139A Complete traumatic amputation of one unspecified lesser toe, initial encounter: Secondary | ICD-10-CM

## 2012-04-09 DIAGNOSIS — IMO0002 Reserved for concepts with insufficient information to code with codable children: Principal | ICD-10-CM | POA: Diagnosis present

## 2012-04-09 DIAGNOSIS — C52 Malignant neoplasm of vagina: Secondary | ICD-10-CM | POA: Diagnosis present

## 2012-04-09 LAB — BASIC METABOLIC PANEL
Calcium: 8.8 mg/dL (ref 8.4–10.5)
GFR calc Af Amer: >90 mL/min (ref >90)
GFR calc non Af Amer: >90 mL/min (ref >90)
Glucose, Bld: 258 mg/dL — ABNORMAL HIGH (ref 70–99)
Potassium: 4.3 mEq/L (ref 3.5–5.1)
Sodium: 138 mEq/L (ref 135–145)

## 2012-04-09 LAB — DIFFERENTIAL
Basophils Absolute: 0.1 10*3/uL (ref 0.0–0.1)
Basophils Relative: 1 % (ref 0–1)
Eosinophils Absolute: 0.4 10*3/uL (ref 0.0–0.7)
Eosinophils Relative: 7 % — ABNORMAL HIGH (ref 0–5)
Lymphs Abs: 1.8 10*3/uL (ref 0.7–4.0)
Neutrophils Relative %: 50 % (ref 43–77)

## 2012-04-09 LAB — CBC
MCH: 28.3 pg (ref 26.0–34.0)
MCV: 86.4 fL (ref 78.0–100.0)
Platelets: 356 10*3/uL (ref 150–400)
RBC: 3.89 MIL/uL (ref 3.87–5.11)
RDW: 13.8 % (ref 11.5–15.5)

## 2012-04-09 MED ORDER — INSULIN ASPART 100 UNIT/ML ~~LOC~~ SOLN
0.0000 [IU] | Freq: Three times a day (TID) | SUBCUTANEOUS | Status: DC
  Administered 2012-04-10: 2 [IU] via SUBCUTANEOUS
  Administered 2012-04-10: 3 [IU] via SUBCUTANEOUS
  Administered 2012-04-10 – 2012-04-11 (×2): 8 [IU] via SUBCUTANEOUS
  Administered 2012-04-11 – 2012-04-12 (×3): 3 [IU] via SUBCUTANEOUS
  Administered 2012-04-12: 11 [IU] via SUBCUTANEOUS
  Administered 2012-04-12 – 2012-04-13 (×3): 3 [IU] via SUBCUTANEOUS
  Administered 2012-04-14 (×2): 8 [IU] via SUBCUTANEOUS

## 2012-04-09 MED ORDER — INSULIN ASPART 100 UNIT/ML ~~LOC~~ SOLN
0.0000 [IU] | Freq: Every day | SUBCUTANEOUS | Status: DC
  Administered 2012-04-10: 2 [IU] via SUBCUTANEOUS
  Administered 2012-04-11: 5 [IU] via SUBCUTANEOUS
  Administered 2012-04-12 – 2012-04-13 (×2): 2 [IU] via SUBCUTANEOUS

## 2012-04-09 MED ORDER — PIPERACILLIN-TAZOBACTAM 3.375 G IVPB
3.3750 g | Freq: Once | INTRAVENOUS | Status: DC
  Administered 2012-04-10: 3.375 g via INTRAVENOUS
  Filled 2012-04-09: qty 50

## 2012-04-09 MED ORDER — VANCOMYCIN HCL IN DEXTROSE 1-5 GM/200ML-% IV SOLN
1000.0000 mg | Freq: Once | INTRAVENOUS | Status: AC
  Administered 2012-04-09: 1000 mg via INTRAVENOUS
  Filled 2012-04-09: qty 200

## 2012-04-09 NOTE — H&P (Signed)
PCP:  Tereso Newcomer, PA, PA   DOA:  04/09/2012  6:31 PM  Chief Complaint:  Left foot ulcer  HPI: 46 year old female with history of uncontrolled diabetes, vaginal carcinoma history, poorly healing left foot ulcer and history of osteomyelitis who presents with worsening swelling, redness and pain at the site of left foot ulcer with some drainage. Patient reports no associated fever or chills. Patient reports no chest pain, shortness of breath, cough. No abdominal pain, no nausea and/or vomiting.  Allergies: No Known Allergies  Prior to Admission medications   Medication Sig Start Date End Date Taking? Authorizing Provider  ferrous sulfate 325 (65 FE) MG tablet Take 325 mg by mouth 2 (two) times daily.     Yes Historical Provider, MD  ibuprofen (ADVIL,MOTRIN) 400 MG tablet Take 400 mg by mouth every 6 (six) hours as needed. For pain relief   Yes Historical Provider, MD  insulin aspart (NOVOLOG) 100 UNIT/ML injection Inject 3 Units into the skin 3 (three) times daily before meals.    Yes Historical Provider, MD  insulin glargine (LANTUS) 100 UNIT/ML injection Inject 20 Units into the skin at bedtime.    Yes Historical Provider, MD  doxycycline (VIBRAMYCIN) 100 MG capsule Take 100 mg by mouth 2 (two) times daily. rx by ED     Historical Provider, MD    Past Medical History  Diagnosis Date  . 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 Laurette Schimke), Cysto performed by Dr. Katherine Roan 12/2009 with evac of clots and bx and fulguration. // H/O stage 2 SCC of the vulva  . Anemia     2/2 blood loss  . Diabetes mellitus type 2, uncontrolled DX: 2001  . 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 Diabetic foot ulcers     Past Surgical History  Procedure Date  . Radical wide local  excision of the vulva and right nguinal lymph node dissection 10/2003    Dr. Stanford Breed  . Uterine dilatation and currettage   . Labial mass excision 07/2003    Dr. Elana Alm  . 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 Turner Daniels  . Irrigation and debridement of left foot with removal of left 01/29/2011    Dr. Turner Daniels    Social History:  reports that she has never smoked. She has never used smokeless tobacco. She reports that she does not drink alcohol or use illicit drugs.  Family History  Problem Relation Age of Onset  . Diabetes Mother   . Diabetes Father   . Diabetes Brother     Review of Systems:  Constitutional: Denies fever, chills, diaphoresis, appetite change and fatigue.  HEENT: Denies photophobia, eye pain, redness, hearing loss, ear pain, congestion, sore throat, rhinorrhea, sneezing, mouth sores, trouble swallowing, neck pain, neck stiffness and tinnitus.   Respiratory: Denies SOB, DOE, cough, chest tightness,  and wheezing.   Cardiovascular: Denies chest pain, palpitations and leg swelling.  Gastrointestinal: Denies nausea, vomiting, abdominal pain, diarrhea, constipation, blood in stool and abdominal distention.  Genitourinary: Denies dysuria, urgency, frequency, hematuria, flank pain and difficulty urinating.  Musculoskeletal: Denies myalgias, back pain, joint swelling, arthralgias and gait problem.  Skin: per HPI Neurological: Denies dizziness, seizures, syncope, weakness, light-headedness, numbness and headaches.  Hematological:  Denies adenopathy. Easy bruising, personal or family bleeding history  Psychiatric/Behavioral: Denies suicidal ideation, mood changes, confusion, nervousness, sleep disturbance and agitation   Physical Exam:  Filed Vitals:   04/09/12 1833 04/09/12 2145  BP: 125/66 136/77  Pulse: 102 95  Temp: 98.8 F (37.1 C) 98.2 F (36.8 C)  TempSrc: Oral Oral  Resp: 16 22  SpO2: 96% 100%    Constitutional: Vital signs reviewed.   Patient is in no acute distress and cooperative with exam. Alert and oriented x3.  Head: Normocephalic and atraumatic Ear: TM normal bilaterally Mouth: no erythema or exudates, MMM Eyes: PERRL, EOMI, conjunctivae normal, No scleral icterus.  Neck: Supple, Trachea midline normal ROM, No JVD, mass, thyromegaly, or carotid bruit present.  Cardiovascular: Regular rhythm but tachycardic, S1 normal, S2 normal, no MRG, pulses symmetric and intact bilaterally Pulmonary/Chest: CTAB, no wheezes, rales, or rhonchi Abdominal: Soft. Non-tender, non-distended, bowel sounds are normal, no masses, organomegaly, or guarding present.  GU: no CVA tenderness Musculoskeletal: No joint deformities, erythema, or stiffness, ROM full and no nontender Ext:  pulses palpable; 2nd left toe amputated; left lateral ankle ulcer around 1/2 cm in diameter with surrounding swelling and erythema Hematology: no cervical, inginal, or axillary adenopathy.  Neurological: A&O x3, Strenght is normal and symmetric bilaterally, cranial nerve II-XII are grossly intact, no focal motor deficit, sensory intact to light touch bilaterally.  Skin: Warm, dry and intact. No rash, cyanosis, or clubbing.  Psychiatric: Normal mood and affect. speech and behavior is normal. Judgment and thought content normal. Cognition and memory are normal.   Labs on Admission:      Component Value Range   WBC 5.5  4.0 - 10.5 (K/uL)   Hemoglobin 11.0  12.0 - 15.0 (g/dL)   HCT 54.0  98.1 - 19.1 (%)   MCV 86.4  78.0 - 100.0 (fL)   Platelets 356  150 - 400 (K/uL)      Component Value Range   Sodium 138  135 - 145 (mEq/L)   Potassium 4.3  3.5 - 5.1 (mEq/L)   Chloride 102  96 - 112 (mEq/L)   CO2 28  19 - 32 (mEq/L)   Glucose, Bld 258 (*) 70 - 99 (mg/dL)   BUN 15  6 - 23 (mg/dL)   Creatinine, Ser 4.78  0.50 - 1.10 (mg/dL)   Calcium 8.8  8.4 - 29.5 (mg/dL)   GFR calc non Af Amer >90  >90 (mL/min)   GFR calc Af Amer >90  >90 (mL/min)    Radiological Exams on  Admission: X ray left ankle: no evidence of osteomyelitis.  Assessment/Plan  Principal Problem:  Diabetic foot ulcer, left - secondary to poorly healing foot ulcer secondary to uncontrolled diabetes - started patient on empiric zosyn and vancomycin IV - X ray with no finding of osteomyelitis - follow up wound culture - continue norco PRN moderate pain and morphine IV PRN severe pain - consider ID consult if this IV antibiotic regimen does not result in clinical improvement over the next 24 - 48 hours  Active Problems:   DIABETES MELLITUS, TYPE II, UNCONTROLLED WITH COMPLICATIONS - follow up HgbA1c - continue insulin per home regimen - continue sliding scale insulin - continue CBG monitoring  Anemia - likely secondary to chronic disease in the setting of history of vaginal carcinoma - hemoglobin stable on admission at 11.0 - no transfusion warranted at this time  DVT Prophylaxis - lovenox subQ  Code Status - full code  Education  - test  results and diagnostic studies were discussed with patient  at the bedside - patient verbalized the understanding - questions were answered at the bedside and contact information was provided for additional questions or concerns  Time Spent on Admission: Over 30 minutes  [name]    04/09/2012, 11:21 PM  [facility] Pager # [phone] Main Office # [phone]

## 2012-04-09 NOTE — ED Provider Notes (Signed)
History     CSN: [id number]  Arrival date & time 04/09/12  1831   First MD Initiated Contact with Patient 04/09/12 1836      Chief Complaint  Patient presents with  . Wound Check    bilateral feet    (Consider location/radiation/quality/duration/timing/severity/associated sxs/prior treatment) Patient is a 46 y.o. female presenting with leg pain. The history is provided by the patient.  Leg Pain  The incident occurred more than 2 days ago.  PT states she has new wounds and swelling to left ankle that started 3 days ago. States it is draining. Hx of the same, was treated for diabetic ulcers in the past. States that she currently does not have a primary care doctor to see or follow up with. Denies fever, chills, malaise.   Past Medical History  Diagnosis Date  . 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 Laurette Schimke), Cysto performed by Dr. Katherine Roan 12/2009 with evac of clots and bx and fulguration. // H/O stage 2 SCC of the vulva  . Anemia     2/2 blood loss  . Diabetes mellitus type 2, uncontrolled DX: 2001  . 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 Diabetic foot ulcers     Past Surgical History  Procedure Date  . Radical wide local excision of the vulva and right nguinal lymph node dissection 10/2003    Dr. Stanford Breed  . Uterine dilatation and currettage   . Labial mass excision 07/2003    Dr. Elana Alm  . 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 Turner Daniels  . Irrigation and debridement of left foot with removal of left 01/29/2011    Dr. Turner Daniels    Family History  Problem Relation Age of Onset  . Diabetes Mother   . Diabetes Father   . Diabetes Brother     History  Substance Use Topics  . Smoking status: Never Smoker   . Smokeless  tobacco: Never Used  . Alcohol Use: No    OB History    Grav Para Term Preterm Abortions TAB SAB Ect Mult Living                  Review of Systems  Constitutional: Negative for fever and chills.  Respiratory: Negative.   Cardiovascular: Negative.   Gastrointestinal: Negative.   Musculoskeletal: Positive for arthralgias.  Skin: Positive for color change and wound.  Neurological: Negative for dizziness, weakness and light-headedness.    Allergies  Review of patient's allergies indicates no known allergies.  Home Medications   Current Outpatient Rx  Name Route Sig Dispense Refill  . FERROUS SULFATE 325 (65 FE) MG PO TABS Oral Take 325 mg by mouth 2 (two) times daily.      . IBUPROFEN 400 MG PO TABS Oral Take 400 mg by mouth every 6 (six) hours as needed. For pain relief    . INSULIN ASPART 100 UNIT/ML [place] SOLN Subcutaneous Inject 3 Units into the skin 3 (three) times daily before meals.     . INSULIN GLARGINE 100 UNIT/ML [place] SOLN Subcutaneous Inject 20 Units into the skin at bedtime.     Marland Kitchen DOXYCYCLINE HYCLATE 100 MG PO CAPS Oral Take 100 mg by mouth 2 (two) times daily. rx by  ED       BP 125/66  Pulse 102  Temp(Src) 98.8 F (37.1 C) (Oral)  Resp 16  SpO2 96%  Physical Exam  Nursing note and vitals reviewed. Constitutional: She is oriented to person, place, and time. She appears well-developed and well-nourished. No distress.  HENT:  Head: Normocephalic.  Eyes: Conjunctivae are normal.  Neck: Neck supple.  Cardiovascular: Normal rate, regular rhythm and normal heart sounds.   Pulmonary/Chest: Effort normal and breath sounds normal. No respiratory distress. She has no wheezes. She has no rales. She exhibits no tenderness.  Abdominal: Soft. Bowel sounds are normal. She exhibits no distension. There is no tenderness.  Musculoskeletal: She exhibits edema. She exhibits no tenderness.       There are multiple wounds over bilateral feet including one large draining ulcer to  the sole of the left foot and one draining ulcer to the left ankle lateral malleolus. There is surrounding cellulitis around the wound. Area non tender.   Neurological: She is alert and oriented to person, place, and time.  Skin: Skin is warm and dry.  Psychiatric: She has a normal mood and affect.    ED Course  Procedures (including critical care time)  Pt with worrisome ulceration to the left lateral malleolus, it is draining. i sent cultures of the drainage. Labs ordered.   Results for orders placed during the hospital encounter of 04/09/12  CBC      Component Value Range   WBC 5.5  4.0 - 10.5 (K/uL)   RBC 3.89  3.87 - 5.11 (MIL/uL)   Hemoglobin 11.0 (*) 12.0 - 15.0 (g/dL)   HCT 96.0 (*) 45.4 - 46.0 (%)   MCV 86.4  78.0 - 100.0 (fL)   MCH 28.3  26.0 - 34.0 (pg)   MCHC 32.7  30.0 - 36.0 (g/dL)   RDW 09.8  11.9 - 14.7 (%)   Platelets 356  150 - 400 (K/uL)  DIFFERENTIAL      Component Value Range   Neutrophils Relative 50  43 - 77 (%)   Neutro Abs 2.7  1.7 - 7.7 (K/uL)   Lymphocytes Relative 34  12 - 46 (%)   Lymphs Abs 1.8  0.7 - 4.0 (K/uL)   Monocytes Relative 8  3 - 12 (%)   Monocytes Absolute 0.5  0.1 - 1.0 (K/uL)   Eosinophils Relative 7 (*) 0 - 5 (%)   Eosinophils Absolute 0.4  0.0 - 0.7 (K/uL)   Basophils Relative 1  0 - 1 (%)   Basophils Absolute 0.1  0.0 - 0.1 (K/uL)  BASIC METABOLIC PANEL      Component Value Range   Sodium 138  135 - 145 (mEq/L)   Potassium 4.3  3.5 - 5.1 (mEq/L)   Chloride 102  96 - 112 (mEq/L)   CO2 28  19 - 32 (mEq/L)   Glucose, Bld 258 (*) 70 - 99 (mg/dL)   BUN 15  6 - 23 (mg/dL)   Creatinine, Ser 8.29 (*) 0.50 - 1.10 (mg/dL)   Calcium 8.8  8.4 - 56.2 (mg/dL)   GFR calc non Af Amer >90  >90 (mL/min)   GFR calc Af Amer >90  >90 (mL/min)   Dg Ankle Complete Left  04/09/2012  *RADIOLOGY REPORT*  Clinical Data: Soft tissue wound of the lateral ankle.  LEFT ANKLE COMPLETE - 3+ VIEW  Comparison: Plain film left foot 01/19/2012.  Findings: A  skin ulceration is seen over the lateral malleolus with underlying soft tissue  swelling.  No bony destructive change or periosteal reaction is identified.  No soft tissue gas.  Defect of the medial malleolus is compatible with old trauma.  IMPRESSION: Skin wound without evidence of underlying osteomyelitis.  Original Report Authenticated By: Bernadene Bell. Maricela Curet, M.D.    Pt with hx of osteomyelitis and multiple foot ulcers. She also has hx of antibiotic failure, resistance. Will cover for vancomycin and sozyn. Will admit given her hx, non compliance. Pt also states she has no PCP currently.   Spoke with [facility], will admit.   1. DIABETIC FOOT ULCER, LEFT   2. Hyperglycemia       MDM          Lottie Mussel, PA 04/10/12 [phone]

## 2012-04-09 NOTE — ED Provider Notes (Signed)
Patient has a history of noncompliance. She states she does not have a doctor. She indicates to me she's had a wound on her left lateral malleolus for the past month. She is noted to have a ulcer that is approximately 1 severe in size with approximately half a centimeter that is open with primary drainage coming from it. She has some diffuse swelling with redness around it that extends more posteriorly than anteriorly. She's also noted to have some other more superficial wounds on her right leg around her ankle.  Patient is probably going to be noncompliant and doesn't have a primary care physician she will probably need admission for IV antibiotics  Medical screening examination/treatment/procedure(s) were conducted as a shared visit with non-physician practitioner(s) and myself.  I personally evaluated the patient during the encounter Devoria Albe, MD, Franz Dell, MD 04/10/12 1043

## 2012-04-09 NOTE — ED Notes (Signed)
Pt from home with reports of ulcers to left ankle, bottom of left foot and top of right foot; ulcer to left ankle more painful with redness and swelling. Pt endorses hx of diabetes with left 2nd toe amputation noted. Pt speaks little [place].

## 2012-04-10 ENCOUNTER — Encounter (HOSPITAL_COMMUNITY): Payer: Self-pay | Admitting: *Deleted

## 2012-04-10 DIAGNOSIS — L97309 Non-pressure chronic ulcer of unspecified ankle with unspecified severity: Secondary | ICD-10-CM

## 2012-04-10 DIAGNOSIS — E118 Type 2 diabetes mellitus with unspecified complications: Secondary | ICD-10-CM

## 2012-04-10 DIAGNOSIS — IMO0002 Reserved for concepts with insufficient information to code with codable children: Secondary | ICD-10-CM

## 2012-04-10 DIAGNOSIS — L97909 Non-pressure chronic ulcer of unspecified part of unspecified lower leg with unspecified severity: Secondary | ICD-10-CM

## 2012-04-10 DIAGNOSIS — E1165 Type 2 diabetes mellitus with hyperglycemia: Secondary | ICD-10-CM

## 2012-04-10 DIAGNOSIS — E782 Mixed hyperlipidemia: Secondary | ICD-10-CM

## 2012-04-10 LAB — COMPREHENSIVE METABOLIC PANEL
ALT: 11 U/L (ref 0–35)
AST: 13 U/L (ref 0–37)
CO2: 28 mEq/L (ref 19–32)
Calcium: 9.4 mg/dL (ref 8.4–10.5)
Creatinine, Ser: 0.48 mg/dL — ABNORMAL LOW (ref 0.50–1.10)
GFR calc Af Amer: >90 mL/min (ref >90)
GFR calc non Af Amer: >90 mL/min (ref >90)
Sodium: 138 mEq/L (ref 135–145)
Total Protein: 7.3 g/dL (ref 6.0–8.3)

## 2012-04-10 LAB — CBC
HCT: 32.5 % — ABNORMAL LOW (ref 36.0–46.0)
Hemoglobin: 10.7 g/dL — ABNORMAL LOW (ref 12.0–15.0)
MCH: 28.2 pg (ref 26.0–34.0)
MCHC: 32.9 g/dL (ref 30.0–36.0)
RBC: 3.79 MIL/uL — ABNORMAL LOW (ref 3.87–5.11)

## 2012-04-10 LAB — GLUCOSE, CAPILLARY
Glucose-Capillary: 161 mg/dL — ABNORMAL HIGH (ref 70–99)
Glucose-Capillary: 122 mg/dL — ABNORMAL HIGH (ref 70–99)

## 2012-04-10 LAB — PROTIME-INR
INR: 0.93 (ref 0.00–1.49)
Prothrombin Time: 12.7 seconds (ref 11.6–15.2)

## 2012-04-10 LAB — HEMOGLOBIN A1C: Mean Plasma Glucose: 275 mg/dL — ABNORMAL HIGH (ref <117)

## 2012-04-10 LAB — APTT: aPTT: 35 seconds (ref 24–37)

## 2012-04-10 MED ORDER — FERROUS SULFATE 325 (65 FE) MG PO TABS
325.0000 mg | ORAL_TABLET | Freq: Two times a day (BID) | ORAL | Status: DC
  Administered 2012-04-10 – 2012-04-14 (×9): 325 mg via ORAL
  Filled 2012-04-10 (×11): qty 1

## 2012-04-10 MED ORDER — ACETAMINOPHEN 325 MG PO TABS: 650.0000 mg | ORAL_TABLET | Freq: Four times a day (QID) | ORAL | Status: DC | PRN

## 2012-04-10 MED ORDER — ENOXAPARIN SODIUM 30 MG/0.3ML ~~LOC~~ SOLN: 30.0000 mg | SUBCUTANEOUS | Status: DC

## 2012-04-10 MED ORDER — MORPHINE SULFATE 2 MG/ML IJ SOLN: 1.0000 mg | INTRAMUSCULAR | Status: DC | PRN

## 2012-04-10 MED ORDER — INSULIN GLARGINE 100 UNIT/ML ~~LOC~~ SOLN
20.0000 [IU] | Freq: Every day | SUBCUTANEOUS | Status: DC
  Administered 2012-04-10 – 2012-04-12 (×3): 20 [IU] via SUBCUTANEOUS

## 2012-04-10 MED ORDER — VANCOMYCIN HCL 1000 MG IV SOLR
1250.0000 mg | Freq: Two times a day (BID) | INTRAVENOUS | Status: DC
  Administered 2012-04-10 – 2012-04-13 (×7): 1250 mg via INTRAVENOUS
  Filled 2012-04-10 (×8): qty 1250

## 2012-04-10 MED ORDER — ENOXAPARIN SODIUM 40 MG/0.4ML ~~LOC~~ SOLN
40.0000 mg | SUBCUTANEOUS | Status: DC
  Administered 2012-04-10 – 2012-04-13 (×4): 40 mg via SUBCUTANEOUS
  Filled 2012-04-10 (×6): qty 0.4

## 2012-04-10 MED ORDER — HYDROCODONE-ACETAMINOPHEN 5-325 MG PO TABS: 1.0000 | ORAL_TABLET | ORAL | Status: DC | PRN

## 2012-04-10 MED ORDER — ACETAMINOPHEN 650 MG RE SUPP: 650.0000 mg | Freq: Four times a day (QID) | RECTAL | Status: DC | PRN

## 2012-04-10 MED ORDER — ONDANSETRON HCL 4 MG/2ML IJ SOLN: 4.0000 mg | Freq: Four times a day (QID) | INTRAMUSCULAR | Status: DC | PRN

## 2012-04-10 MED ORDER — PIPERACILLIN-TAZOBACTAM 3.375 G IVPB
3.3750 g | Freq: Three times a day (TID) | INTRAVENOUS | Status: DC
  Administered 2012-04-10 – 2012-04-13 (×10): 3.375 g via INTRAVENOUS
  Filled 2012-04-10 (×14): qty 50

## 2012-04-10 MED ORDER — INSULIN ASPART 100 UNIT/ML ~~LOC~~ SOLN
3.0000 [IU] | Freq: Three times a day (TID) | SUBCUTANEOUS | Status: DC
  Administered 2012-04-10 – 2012-04-13 (×11): 3 [IU] via SUBCUTANEOUS

## 2012-04-10 MED ORDER — SODIUM CHLORIDE 0.9 % IV SOLN
INTRAVENOUS | Status: DC
  Administered 2012-04-11: 06:00:00 via INTRAVENOUS
  Administered 2012-04-12 – 2012-04-13 (×2): 1000 mL via INTRAVENOUS

## 2012-04-10 MED ORDER — ONDANSETRON HCL 4 MG PO TABS: 4.0000 mg | ORAL_TABLET | Freq: Four times a day (QID) | ORAL | Status: DC | PRN

## 2012-04-10 NOTE — Progress Notes (Signed)
ANTIBIOTIC CONSULT NOTE - INITIAL  Pharmacy Consult for Zosyn/Vancomycin Indication: Diabetic Foot ulcer  No Known Allergies  Patient Measurements: Height: 5\' 6"  (167.6 cm) Weight: 130 lb 4.8 oz (59.104 kg) IBW/kg (Calculated) : 59.3    Vital Signs: Temp: 99.1 F (37.3 C) (05/12 0005) Temp src: Oral (05/12 0005) BP: 119/86 mmHg (05/12 0005) Pulse Rate: 96  (05/12 0005) Intake/Output from previous day:   Intake/Output from this shift:    Labs:  [facility] 04/09/12 2029  WBC 5.5  HGB 11.0*  PLT 356  LABCREA --  CREATININE 0.47*   Estimated Creatinine Clearance: 81.1 ml/min (by C-G formula based on Cr of 0.47). No results found for this basename: VANCOTROUGH:2,VANCOPEAK:2,VANCORANDOM:2,GENTTROUGH:2,GENTPEAK:2,GENTRANDOM:2,TOBRATROUGH:2,TOBRAPEAK:2,TOBRARND:2,AMIKACINPEAK:2,AMIKACINTROU:2,AMIKACIN:2, in the last 72 hours   Microbiology: No results found for this or any previous visit (from the past 720 hour(s)).  Medical History: Past Medical History  Diagnosis Date  . 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 Laurette Schimke), Cysto performed by Dr. Katherine Roan 12/2009 with evac of clots and bx and fulguration. // H/O stage 2 SCC of the vulva  . Anemia     2/2 blood loss  . Diabetes mellitus type 2, uncontrolled DX: 2001  . 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 Diabetic foot ulcers     Medications:  Scheduled:    . enoxaparin (LOVENOX) injection  40 mg Subcutaneous Q24H  . ferrous sulfate  325 mg Oral BID  . insulin aspart  0-15 Units Subcutaneous TID WC  . insulin aspart  0-5 Units Subcutaneous QHS  . insulin aspart  3 Units Subcutaneous TID AC  . insulin glargine  20 Units Subcutaneous QHS  . vancomycin  1,000 mg Intravenous Once  . DISCONTD:  enoxaparin  30 mg Subcutaneous Q24H  . DISCONTD: piperacillin-tazobactam (ZOSYN)  IV  3.375 g Intravenous Once   Infusions:    . sodium chloride     Assessment: 46 yo female with diabetic foot ulcer, history of osteomyelitis, no osteomyelitis on today's X-ray.  Goal of Therapy:  Vancomycin trough level 10-15 mcg/ml  Plan:   Zosyn 3.375 Gm IV q8h. EI infusion  Vancomycin 1250mg  IV q12h.  CrCl~158 (N)  F/U SCr/levels as needed.  Susanne Greenhouse R 04/10/2012,12:34 AM

## 2012-04-10 NOTE — Progress Notes (Signed)
Rx:  Brief Lovenox note  Wt = 59 kg  CrCl= 81 ml/min Adjusted Lovenox to 40mg  daily for DVT prophylaxis.  Geronimo Boot. D 04/10/2012. 12:28 AM

## 2012-04-10 NOTE — ED Provider Notes (Signed)
See prior note   Ward Givens, MD 04/10/12 1044

## 2012-04-10 NOTE — Progress Notes (Signed)
Subjective: Complaining of bilateral foot pain  Objective: Vital signs in last 24 hours: Filed Vitals:   04/09/12 1833 04/09/12 2145 04/10/12 0005 04/10/12 0537  BP: 125/66 136/77 119/86 96/53  Pulse: 102 95 96 91  Temp: 98.8 F (37.1 C) 98.2 F (36.8 C) 99.1 F (37.3 C) 99 F (37.2 C)  TempSrc: Oral Oral Oral Oral  Resp: 16 22 20 18   Height:   5\' 6"  (1.676 m)   Weight:   59.104 kg (130 lb 4.8 oz)   SpO2: 96% 100% 100% 98%    Intake/Output Summary (Last 24 hours) at 04/10/12 1356 Last data filed at 04/10/12 0900  Gross per 24 hour  Intake    240 ml  Output      0 ml  Net    240 ml    Weight change:  Head: Normocephalic and atraumatic  Ear: TM normal bilaterally  Mouth: no erythema or exudates, MMM  Eyes: PERRL, EOMI, conjunctivae normal, No scleral icterus.  Neck: Supple, Trachea midline normal ROM, No JVD, mass, thyromegaly, or carotid bruit present.  Cardiovascular: Regular rhythm but tachycardic, S1 normal, S2 normal, no MRG, pulses symmetric and intact bilaterally  Pulmonary/Chest: CTAB, no wheezes, rales, or rhonchi  Abdominal: Soft. Non-tender, non-distended, bowel sounds are normal, no masses, organomegaly, or guarding present.  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  Neurological: A&O x3, Strenght is normal and symmetric bilaterally, cranial nerve II-XII are grossly intact, no focal motor deficit, sensory intact to light touch bilaterally.  Skin: Warm, dry and intact. No rash, cyanosis, or clubbing.  Psychiatric: Normal mood and affect. speech and behavior is normal. Judgment and thought content normal. Cognition and memory are normal.     Lab Results: Results for orders placed during the hospital  encounter of 04/09/12 (from the past 24 hour(s))  CBC     Status: Abnormal   Collection Time   04/09/12  8:29 PM      Component Value Range   WBC 5.5  4.0 - 10.5 (K/uL)   RBC 3.89  3.87 - 5.11 (MIL/uL)   Hemoglobin 11.0 (*) 12.0 - 15.0 (g/dL)   HCT 40.9 (*) 81.1 - 46.0 (%)   MCV 86.4  78.0 - 100.0 (fL)   MCH 28.3  26.0 - 34.0 (pg)   MCHC 32.7  30.0 - 36.0 (g/dL)   RDW 91.4  78.2 - 95.6 (%)   Platelets 356  150 - 400 (K/uL)  DIFFERENTIAL     Status: Abnormal   Collection Time   04/09/12  8:29 PM      Component Value Range   Neutrophils Relative 50  43 - 77 (%)   Neutro Abs 2.7  1.7 - 7.7 (K/uL)   Lymphocytes Relative 34  12 - 46 (%)   Lymphs Abs 1.8  0.7 - 4.0 (K/uL)   Monocytes Relative 8  3 - 12 (%)   Monocytes Absolute 0.5  0.1 - 1.0 (K/uL)   Eosinophils Relative 7 (*) 0 - 5 (%)   Eosinophils Absolute 0.4  0.0 - 0.7 (K/uL)   Basophils Relative 1  0 - 1 (%)   Basophils Absolute 0.1  0.0 - 0.1 (K/uL)  BASIC METABOLIC PANEL     Status: Abnormal   Collection Time  04/09/12  8:29 PM      Component Value Range   Sodium 138  135 - 145 (mEq/L)   Potassium 4.3  3.5 - 5.1 (mEq/L)   Chloride 102  96 - 112 (mEq/L)   CO2 28  19 - 32 (mEq/L)   Glucose, Bld 258 (*) 70 - 99 (mg/dL)   BUN 15  6 - 23 (mg/dL)   Creatinine, Ser 8.11 (*) 0.50 - 1.10 (mg/dL)   Calcium 8.8  8.4 - 91.4 (mg/dL)   GFR calc non Af Amer >90  >90 (mL/min)   GFR calc Af Amer >90  >90 (mL/min)  PHOSPHORUS     Status: Normal   Collection Time   04/10/12  3:56 AM      Component Value Range   Phosphorus 3.5  2.3 - 4.6 (mg/dL)  MAGNESIUM     Status: Normal   Collection Time   04/10/12  3:56 AM      Component Value Range   Magnesium 1.9  1.5 - 2.5 (mg/dL)  APTT     Status: Normal   Collection Time   04/10/12  3:56 AM      Component Value Range   aPTT 35  24 - 37 (seconds)  PROTIME-INR     Status: Normal   Collection Time   04/10/12  3:56 AM      Component Value Range   Prothrombin Time 12.7  11.6 - 15.2  (seconds)   INR 0.93  0.00 - 1.49   HEMOGLOBIN A1C     Status: Abnormal   Collection Time   04/10/12  3:56 AM      Component Value Range   Hemoglobin A1C 11.2 (*) <5.7 (%)   Mean Plasma Glucose 275 (*) <117 (mg/dL)  COMPREHENSIVE METABOLIC PANEL     Status: Abnormal   Collection Time   04/10/12  3:56 AM      Component Value Range   Sodium 138  135 - 145 (mEq/L)   Potassium 4.1  3.5 - 5.1 (mEq/L)   Chloride 102  96 - 112 (mEq/L)   CO2 28  19 - 32 (mEq/L)   Glucose, Bld 239 (*) 70 - 99 (mg/dL)   BUN 11  6 - 23 (mg/dL)   Creatinine, Ser 7.82 (*) 0.50 - 1.10 (mg/dL)   Calcium 9.4  8.4 - 95.6 (mg/dL)   Total Protein 7.3  6.0 - 8.3 (g/dL)   Albumin 3.0 (*) 3.5 - 5.2 (g/dL)   AST 13  0 - 37 (U/L)   ALT 11  0 - 35 (U/L)   Alkaline Phosphatase 119 (*) 39 - 117 (U/L)   Total Bilirubin 0.2 (*) 0.3 - 1.2 (mg/dL)   GFR calc non Af Amer >90  >90 (mL/min)   GFR calc Af Amer >90  >90 (mL/min)  CBC     Status: Abnormal   Collection Time   04/10/12  3:56 AM      Component Value Range   WBC 4.7  4.0 - 10.5 (K/uL)   RBC 3.79 (*) 3.87 - 5.11 (MIL/uL)   Hemoglobin 10.7 (*) 12.0 - 15.0 (g/dL)   HCT 21.3 (*) 08.6 - 46.0 (%)   MCV 85.8  78.0 - 100.0 (fL)   MCH 28.2  26.0 - 34.0 (pg)   MCHC 32.9  30.0 - 36.0 (g/dL)   RDW 57.8  46.9 - 62.9 (%)   Platelets 374  150 - 400 (K/uL)  GLUCOSE, CAPILLARY     Status: Abnormal  Collection Time   04/10/12  7:39 AM      Component Value Range   Glucose-Capillary 161 (*) 70 - 99 (mg/dL)  GLUCOSE, CAPILLARY     Status: Abnormal   Collection Time   04/10/12 11:25 AM      Component Value Range   Glucose-Capillary 122 (*) 70 - 99 (mg/dL)     Micro: No results found for this or any previous visit (from the past 240 hour(s)).  Studies/Results: Dg Ankle Complete Left  04/09/2012  *RADIOLOGY REPORT*  Clinical Data: Soft tissue wound of the lateral ankle.  LEFT ANKLE COMPLETE - 3+ VIEW  Comparison: Plain film left foot 01/19/2012.  Findings: A skin ulceration  is seen over the lateral malleolus with underlying soft tissue swelling.  No bony destructive change or periosteal reaction is identified.  No soft tissue gas.  Defect of the medial malleolus is compatible with old trauma.  IMPRESSION: Skin wound without evidence of underlying osteomyelitis.  Original Report Authenticated By: Bernadene Bell. Maricela Curet, M.D.    Medications:  Scheduled Meds:   . enoxaparin (LOVENOX) injection  40 mg Subcutaneous Q24H  . ferrous sulfate  325 mg Oral BID  . insulin aspart  0-15 Units Subcutaneous TID WC  . insulin aspart  0-5 Units Subcutaneous QHS  . insulin aspart  3 Units Subcutaneous TID AC  . insulin glargine  20 Units Subcutaneous QHS  . piperacillin-tazobactam (ZOSYN)  IV  3.375 g Intravenous Q8H  . vancomycin  1,250 mg Intravenous Q12H  . vancomycin  1,000 mg Intravenous Once  . DISCONTD: enoxaparin  30 mg Subcutaneous Q24H  . DISCONTD: piperacillin-tazobactam (ZOSYN)  IV  3.375 g Intravenous Once   Continuous Infusions:   . sodium chloride     PRN Meds:.acetaminophen, acetaminophen, HYDROcodone-acetaminophen, morphine injection, ondansetron (ZOFRAN) IV, ondansetron   Assessment: Active Problems:  DIABETES MELLITUS, TYPE II  DIABETIC FOOT ULCER, LEFT   Plan: #1 bilateral foot ulcers, ABI in the morning, continue IV antibiotics, and the patient has severe peripheral vascular disease we'll consult vascular surgery #2 diabetes continue SSI #3 anemia stable likely anemia of chronic disease   LOS: 1 day   [facility] 04/10/2012, 1:56 PM

## 2012-04-11 DIAGNOSIS — L97309 Non-pressure chronic ulcer of unspecified ankle with unspecified severity: Secondary | ICD-10-CM

## 2012-04-11 DIAGNOSIS — E118 Type 2 diabetes mellitus with unspecified complications: Secondary | ICD-10-CM

## 2012-04-11 DIAGNOSIS — E782 Mixed hyperlipidemia: Secondary | ICD-10-CM

## 2012-04-11 DIAGNOSIS — I739 Peripheral vascular disease, unspecified: Secondary | ICD-10-CM

## 2012-04-11 DIAGNOSIS — E1165 Type 2 diabetes mellitus with hyperglycemia: Secondary | ICD-10-CM

## 2012-04-11 DIAGNOSIS — L97909 Non-pressure chronic ulcer of unspecified part of unspecified lower leg with unspecified severity: Secondary | ICD-10-CM

## 2012-04-11 LAB — GLUCOSE, CAPILLARY
Glucose-Capillary: 276 mg/dL — ABNORMAL HIGH (ref 70–99)
Glucose-Capillary: 194 mg/dL — ABNORMAL HIGH (ref 70–99)
Glucose-Capillary: 167 mg/dL — ABNORMAL HIGH (ref 70–99)

## 2012-04-11 NOTE — Progress Notes (Signed)
Subjective: Interpreter  brought into the room Explained to the patient that the patient may need a vascular surgery consultation based on the results of her ABI Objective: Vital signs in last 24 hours: Filed Vitals:   04/10/12 0537 04/10/12 1521 04/10/12 2046 04/11/12 0506  BP: 96/53 145/80 110/69 127/80  Pulse: 91 97 92 86  Temp: 99 F (37.2 C) 97.7 F (36.5 C) 98.7 F (37.1 C) 98.6 F (37 C)  TempSrc: Oral Oral Oral Oral  Resp: 18 18 18 16   Height:      Weight:      SpO2: 98% 98% 95% 98%    Intake/Output Summary (Last 24 hours) at 04/11/12 1236 Last data filed at 04/11/12 0947  Gross per 24 hour  Intake   2170 ml  Output      0 ml  Net   2170 ml    Weight change:   Constitutional: Vital signs reviewed. Patient is in no acute distress and cooperative with exam. Alert and oriented x3.  Head: Normocephalic and atraumatic  Ear: TM normal bilaterally  Mouth: no erythema or exudates, MMM  Eyes: PERRL, EOMI, conjunctivae normal, No scleral icterus.  Neck: Supple, Trachea midline normal ROM, No JVD, mass, thyromegaly, or carotid bruit present.  Cardiovascular: Regular rhythm but tachycardic, S1 normal, S2 normal, no MRG, pulses symmetric and intact bilaterally  Pulmonary/Chest: CTAB, no wheezes, rales, or rhonchi  Abdominal: Soft. Non-tender, non-distended, bowel sounds are normal, no masses, organomegaly, or guarding present.  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  Neurological: A&O x3, Strenght is normal and symmetric bilaterally, cranial nerve II-XII are grossly intact, no focal motor deficit, sensory intact to light touch bilaterally.  Skin: Warm, dry and intact. No rash, cyanosis, or clubbing.  Psychiatric:  Normal mood and affect. speech and behavior is normal. Judgment and thought content normal. Cognition and memory are normal.    Lab Results: Results for orders placed during the hospital encounter of 04/09/12 (from the past 24 hour(s))  GLUCOSE, CAPILLARY     Status: Abnormal   Collection Time   04/10/12  4:53 PM      Component Value Range   Glucose-Capillary 299 (*) 70 - 99 (mg/dL)  GLUCOSE, CAPILLARY     Status: Abnormal   Collection Time   04/10/12  8:51 PM      Component Value Range   Glucose-Capillary 204 (*) 70 - 99 (mg/dL)   Comment 1 Notify RN    GLUCOSE, CAPILLARY     Status: Abnormal   Collection Time   04/11/12  8:42 AM      Component Value Range   Glucose-Capillary 276 (*) 70 - 99 (mg/dL)  GLUCOSE, CAPILLARY     Status: Abnormal   Collection Time   04/11/12 11:52 AM      Component Value Range   Glucose-Capillary 194 (*) 70 - 99 (mg/dL)     Micro: No results found for this or any previous visit (from the past 240 hour(s)).  Studies/Results: Dg Ankle Complete Left  04/09/2012  *RADIOLOGY REPORT*  Clinical Data: Soft tissue wound of the lateral ankle.  LEFT ANKLE COMPLETE - 3+ VIEW  Comparison: Plain film left foot 01/19/2012.  Findings: A skin ulceration is seen over the lateral malleolus with underlying soft tissue  swelling.  No bony destructive change or periosteal reaction is identified.  No soft tissue gas.  Defect of the medial malleolus is compatible with old trauma.  IMPRESSION: Skin wound without evidence of underlying osteomyelitis.  Original Report Authenticated By: Bernadene Bell. Maricela Curet, M.D.    Medications: Scheduled Meds:   . enoxaparin (LOVENOX) injection  40 mg Subcutaneous Q24H  . ferrous sulfate  325 mg Oral BID  . insulin aspart  0-15 Units Subcutaneous TID WC  . insulin aspart  0-5 Units Subcutaneous QHS  . insulin aspart  3 Units Subcutaneous TID AC  . insulin glargine  20 Units Subcutaneous QHS  . piperacillin-tazobactam (ZOSYN)  IV  3.375 g  Intravenous Q8H  . vancomycin  1,250 mg Intravenous Q12H   Continuous Infusions:   . sodium chloride 75 mL/hr at 04/11/12 0626   PRN Meds:.acetaminophen, acetaminophen, HYDROcodone-acetaminophen, morphine injection, ondansetron (ZOFRAN) IV, ondansetron   Assessment: Active Problems:  DIABETES MELLITUS, TYPE II  DIABETIC FOOT ULCER, LEFT   Plan: #1 bilateral foot ulcers, ABI in the morning, continue IV antibiotics, and the patient has severe peripheral vascular disease we'll consult vascular surgery , PT OT evaluation for diabetic boots, evaluation of fall risk #2 diabetes continue SSI , hemoglobin A1c of 11.2 #3 anemia stable likely anemia of chronic disease    LOS: 2 days   [facility] 04/11/2012, 12:36 PM

## 2012-04-11 NOTE — Consult Note (Signed)
WOC consult Note Reason for Consult: Left foot, plantar aspect, DFU.  Also noted is left lateral malleolar healing ulceration. Wound type: neuropathic, venous insufficiency Pressure Ulcer POA: No Measurement:DFU measures 2cm x 2cm x .4cm.  Callus encompasses majority of wound measurement and need debridement.  Left lateral malleolar wound is nearly healed and measures .5cm x 2cm x .2cm.  There is a scab (dried serum) covering this wound. Wound bed:As noted above Drainage (amount, consistency, odor) None noted from either wound Periwound:intact.  As mentioned above, callus is noted surrounding plantar ulceration Dressing procedure/placement/frequency:NS dressings to plantar ulceration will be performed while here to keep area clean and protected.  Follow up with either a podiatrist or the staff at an outpatient wound care center here at [facility] (or other) is indicated for removal of the callus surrounding her DFU.  The left malleolar wound is likely to hweal spontaneously. I will not follow.  Please re-consult if needed. Thanks, Ladona Mow, MSN, RN, [facility], CWOCN [phone])

## 2012-04-11 NOTE — Progress Notes (Signed)
PT Cancellation Note  Treatment cancelled today due to pt was up ambulating in the room independently and states she does not need PT.  [name] [facility] 04/11/2012, 1:14 PM

## 2012-04-11 NOTE — Progress Notes (Signed)
Inpatient Diabetes Program Recommendations  AACE/ADA: New Consensus Statement on Inpatient Glycemic Control (2009)  Target Ranges:  Prepandial:   less than 140 mg/dL      Peak postprandial:   less than 180 mg/dL (1-2 hours)      Critically ill patients:  140 - 180 mg/dL   Reason for Visit: Elevated CBG's.  Hgb A1C of 11.2 Results for [name], [name] (MRN [id number]) as of 04/11/2012 16:44  Ref. Range 12/14/2010 06:15 01/26/2011 18:50 02/11/2011 14:55 02/19/2011 00:00 04/10/2012 03:56  Hemoglobin A1C Latest Range: <5.7 % 13.2... (H) 11.5... (H) 10.2 9.6 11.2 (H)     Note: All known Hgb A1c's are elevated.  Patient denies missing insulin doses.  Denies problems getting insulin.  States she would be interested in Outpatient Diabetes Education F/U if appropriate after discharge.

## 2012-04-11 NOTE — Progress Notes (Signed)
ABI completed.  Preliminary report is >1.0 bilaterally. 

## 2012-04-12 DIAGNOSIS — L97309 Non-pressure chronic ulcer of unspecified ankle with unspecified severity: Secondary | ICD-10-CM

## 2012-04-12 DIAGNOSIS — E782 Mixed hyperlipidemia: Secondary | ICD-10-CM

## 2012-04-12 DIAGNOSIS — E1165 Type 2 diabetes mellitus with hyperglycemia: Secondary | ICD-10-CM

## 2012-04-12 DIAGNOSIS — E118 Type 2 diabetes mellitus with unspecified complications: Secondary | ICD-10-CM

## 2012-04-12 DIAGNOSIS — L97909 Non-pressure chronic ulcer of unspecified part of unspecified lower leg with unspecified severity: Secondary | ICD-10-CM

## 2012-04-12 LAB — GLUCOSE, CAPILLARY
Glucose-Capillary: 391 mg/dL — ABNORMAL HIGH (ref 70–99)
Glucose-Capillary: 311 mg/dL — ABNORMAL HIGH (ref 70–99)
Glucose-Capillary: 158 mg/dL — ABNORMAL HIGH (ref 70–99)

## 2012-04-12 LAB — WOUND CULTURE: Gram Stain: NONE SEEN

## 2012-04-12 NOTE — Progress Notes (Signed)
Inpatient Diabetes Program Recommendations  AACE/ADA: New Consensus Statement on Inpatient Glycemic Control (2009)  Target Ranges:  Prepandial:   less than 140 mg/dL      Peak postprandial:   less than 180 mg/dL (1-2 hours)      Critically ill patients:  140 - 180 mg/dL   Reason for Visit: Results for CAASI, GIGLIA (MRN [id number]) as of 04/12/2012 16:43  Ref. Range 04/11/2012 11:52 04/11/2012 16:11 04/11/2012 22:17 04/12/2012 07:59 04/12/2012 12:28  Glucose-Capillary Latest Range: 70-99 mg/dL 409 (H) 811 (H) 914 (H) 311 (H) 187 (H)   May consider increasing Lantus to 24 units daily.  Also consider increasing Novolog meal coverage to 4 units tid with meals. A1C is sub-optimal at 11.2%.  Needs follow-up with PCP for diabetes management.  Also consider order for outpatient diabetes education.

## 2012-04-12 NOTE — Progress Notes (Signed)
Subjective: States that she is feeling better  Objective: Vital signs in last 24 hours: Filed Vitals:   04/11/12 1305 04/11/12 2125 04/11/12 2219 04/12/12 0656  BP: 119/77  96/60 123/68  Pulse: 93 94 84 85  Temp: 98.7 F (37.1 C)  98.9 F (37.2 C) 98.1 F (36.7 C)  TempSrc: Oral  Oral Oral  Resp: 18  18 16   Height:      Weight:      SpO2: 97%  94% 90%    Intake/Output Summary (Last 24 hours) at 04/12/12 1243 Last data filed at 04/12/12 0427  Gross per 24 hour  Intake   2750 ml  Output      0 ml  Net   2750 ml    Weight change:   Constitutional: Vital signs reviewed. Patient is in no acute distress and cooperative with exam. Alert and oriented x3.  Head: Normocephalic and atraumatic  Ear: TM normal bilaterally  Mouth: no erythema or exudates, MMM  Eyes: PERRL, EOMI, conjunctivae normal, No scleral icterus.  Neck: Supple, Trachea midline normal ROM, No JVD, mass, thyromegaly, or carotid bruit present.  Cardiovascular: Regular rhythm but tachycardic, S1 normal, S2 normal, no MRG, pulses symmetric and intact bilaterally  Pulmonary/Chest: CTAB, no wheezes, rales, or rhonchi  Abdominal: Soft. Non-tender, non-distended, bowel sounds are normal, no masses, organomegaly, or guarding present.  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  Neurological: A&O x3, Strenght is normal and symmetric bilaterally, cranial nerve II-XII are grossly intact, no focal motor deficit, sensory intact to light touch bilaterally.  Skin: Warm, dry and intact. No rash, cyanosis, or clubbing.  Psychiatric: Normal mood and affect. speech and behavior is normal. Judgment and thought content normal. Cognition and memory are normal.      Lab  Results: Results for orders placed during the hospital encounter of 04/09/12 (from the past 24 hour(s))  GLUCOSE, CAPILLARY     Status: Abnormal   Collection Time   04/11/12  4:11 PM      Component Value Range   Glucose-Capillary 167 (*) 70 - 99 (mg/dL)  GLUCOSE, CAPILLARY     Status: Abnormal   Collection Time   04/11/12 10:17 PM      Component Value Range   Glucose-Capillary 391 (*) 70 - 99 (mg/dL)  GLUCOSE, CAPILLARY     Status: Abnormal   Collection Time   04/12/12  7:59 AM      Component Value Range   Glucose-Capillary 311 (*) 70 - 99 (mg/dL)  GLUCOSE, CAPILLARY     Status: Abnormal   Collection Time   04/12/12 12:28 PM      Component Value Range   Glucose-Capillary 187 (*) 70 - 99 (mg/dL)     Micro: Recent Results (from the past 240 hour(s))  WOUND CULTURE     Status: Normal   Collection Time   04/09/12  9:54 PM      Component Value Range Status Comment   Specimen Description FOOT   Final    Special Requests NONE   Final    Gram Stain     Final    Value: NO WBC SEEN     RARE SQUAMOUS EPITHELIAL CELLS PRESENT     RARE GRAM POSITIVE COCCI     IN PAIRS  Culture     Final    Value: ABUNDANT STAPHYLOCOCCUS AUREUS     Note: RIFAMPIN AND GENTAMICIN SHOULD NOT BE USED AS SINGLE DRUGS FOR TREATMENT OF STAPH INFECTIONS. This organism is presumed to be Clindamycin resistant based on detection of inducible Clindamycin resistance.   Report Status 04/12/2012 FINAL   Final    Organism ID, Bacteria STAPHYLOCOCCUS AUREUS   Final     Studies/Results: No results found.  Medications:  Scheduled Meds:   . enoxaparin (LOVENOX) injection  40 mg Subcutaneous Q24H  . ferrous sulfate  325 mg Oral BID  . insulin aspart  0-15 Units Subcutaneous TID WC  . insulin aspart  0-5 Units Subcutaneous QHS  . insulin aspart  3 Units Subcutaneous TID AC  . insulin glargine  20 Units Subcutaneous QHS  . piperacillin-tazobactam (ZOSYN)  IV  3.375 g Intravenous Q8H  . vancomycin  1,250 mg  Intravenous Q12H   Continuous Infusions:   . sodium chloride 1,000 mL (04/12/12 0427)   PRN Meds:.acetaminophen, acetaminophen, HYDROcodone-acetaminophen, morphine injection, ondansetron (ZOFRAN) IV, ondansetron   Assessment: Active Problems:  DIABETES MELLITUS, TYPE II  DIABETIC FOOT ULCER, LEFT   Plan:  #1 bilateral foot ulcers, ABI results NORMAL  , continue IV antibiotics, WOC consult , PT OT evaluation for diabetic boots, OT evaluation of fall risk , Bone scan if negative , dc home on doxycycline.  #2 diabetes continue SSI , hemoglobin A1c of 11.2  #3 anemia stable likely anemia of chronic disease   LOS: 3 days   [facility] 04/12/2012, 12:43 PM

## 2012-04-12 NOTE — Progress Notes (Signed)
Pt reports she is I with ADL activity. OT will sign off. Thanks, Lise Auer

## 2012-04-13 ENCOUNTER — Inpatient Hospital Stay (HOSPITAL_COMMUNITY): Payer: Self-pay

## 2012-04-13 ENCOUNTER — Encounter (HOSPITAL_COMMUNITY): Payer: Self-pay

## 2012-04-13 DIAGNOSIS — L97909 Non-pressure chronic ulcer of unspecified part of unspecified lower leg with unspecified severity: Secondary | ICD-10-CM

## 2012-04-13 DIAGNOSIS — E118 Type 2 diabetes mellitus with unspecified complications: Secondary | ICD-10-CM

## 2012-04-13 DIAGNOSIS — E782 Mixed hyperlipidemia: Secondary | ICD-10-CM

## 2012-04-13 DIAGNOSIS — E1165 Type 2 diabetes mellitus with hyperglycemia: Secondary | ICD-10-CM

## 2012-04-13 DIAGNOSIS — L97309 Non-pressure chronic ulcer of unspecified ankle with unspecified severity: Secondary | ICD-10-CM

## 2012-04-13 LAB — GLUCOSE, CAPILLARY
Glucose-Capillary: 193 mg/dL — ABNORMAL HIGH (ref 70–99)
Glucose-Capillary: 94 mg/dL (ref 70–99)

## 2012-04-13 MED ORDER — INSULIN ASPART 100 UNIT/ML ~~LOC~~ SOLN
4.0000 [IU] | Freq: Three times a day (TID) | SUBCUTANEOUS | Status: DC
  Administered 2012-04-13 – 2012-04-14 (×3): 4 [IU] via SUBCUTANEOUS

## 2012-04-13 MED ORDER — INSULIN GLARGINE 100 UNIT/ML ~~LOC~~ SOLN
24.0000 [IU] | Freq: Every day | SUBCUTANEOUS | Status: DC
  Administered 2012-04-13: 24 [IU] via SUBCUTANEOUS

## 2012-04-13 MED ORDER — DOXYCYCLINE HYCLATE 100 MG PO TABS
100.0000 mg | ORAL_TABLET | Freq: Two times a day (BID) | ORAL | Status: DC
  Administered 2012-04-13 – 2012-04-14 (×2): 100 mg via ORAL
  Filled 2012-04-13 (×3): qty 1

## 2012-04-13 MED ORDER — CEFUROXIME AXETIL 500 MG PO TABS
500.0000 mg | ORAL_TABLET | Freq: Two times a day (BID) | ORAL | Status: DC
  Administered 2012-04-13 – 2012-04-14 (×2): 500 mg via ORAL
  Filled 2012-04-13 (×4): qty 1

## 2012-04-13 MED ORDER — TECHNETIUM TC 99M MEDRONATE IV KIT
24.0000 | PACK | Freq: Once | INTRAVENOUS | Status: AC | PRN
  Administered 2012-04-13: 24 via INTRAVENOUS

## 2012-04-13 NOTE — Progress Notes (Signed)
Subjective: No specific complaints.  Mainly speaking in [place].  Called a family member to help translate on the telephone.  Objective: Vital signs in last 24 hours: Filed Vitals:   04/12/12 1449 04/12/12 2240 04/12/12 2301 04/13/12 0622  BP: 117/66  115/75 134/79  Pulse: 92 100 84 84  Temp: 98.1 F (36.7 C)  98 F (36.7 C) 98.2 F (36.8 C)  TempSrc: Oral  Oral Oral  Resp: 16  16 16   Height:      Weight:      SpO2: 100%  92% 100%   Weight change:   Intake/Output Summary (Last 24 hours) at 04/13/12 1425 Last data filed at 04/13/12 1234  Gross per 24 hour  Intake 3622.95 ml  Output      0 ml  Net 3622.95 ml    Physical Exam: General: Awake, Oriented, No acute distress. HEENT: EOMI. Neck: Supple CV: S1 and S2 Lungs: Clear to ascultation bilaterally Abdomen: Soft, Nontender, Nondistended, +bowel sounds. Ext: Good pulses. Trace edema.  Left lateral ankle ulcer measuring approximately half centimeter in diameter.  Plantar ulcer left foot measuring centimeter in diameter.  Lab Results: No results found for this basename: NA:2,K:2,CL:2,CO2:2,GLUCOSE:2,BUN:2,CREATININE:2,CALCIUM:2,MG:2,PHOS:2 in the last 72 hours No results found for this basename: AST:2,ALT:2,ALKPHOS:2,BILITOT:2,PROT:2,ALBUMIN:2 in the last 72 hours No results found for this basename: LIPASE:2,AMYLASE:2 in the last 72 hours No results found for this basename: WBC:2,NEUTROABS:2,HGB:2,HCT:2,MCV:2,PLT:2 in the last 72 hours No results found for this basename: CKTOTAL:3,CKMB:3,CKMBINDEX:3,TROPONINI:3 in the last 72 hours No components found with this basename: POCBNP:3 No results found for this basename: DDIMER:2 in the last 72 hours No results found for this basename: HGBA1C:2 in the last 72 hours No results found for this basename: CHOL:2,HDL:2,LDLCALC:2,TRIG:2,CHOLHDL:2,LDLDIRECT:2 in the last 72 hours No results found for this basename: TSH,T4TOTAL,FREET3,T3FREE,THYROIDAB in the last 72 hours No results  found for this basename: VITAMINB12:2,FOLATE:2,FERRITIN:2,TIBC:2,IRON:2,RETICCTPCT:2 in the last 72 hours  Micro Results: Recent Results (from the past 240 hour(s))  WOUND CULTURE     Status: Normal   Collection Time   04/09/12  9:54 PM      Component Value Range Status Comment   Specimen Description FOOT   Final    Special Requests NONE   Final    Gram Stain     Final    Value: NO WBC SEEN     RARE SQUAMOUS EPITHELIAL CELLS PRESENT     RARE GRAM POSITIVE COCCI     IN PAIRS   Culture     Final    Value: ABUNDANT STAPHYLOCOCCUS AUREUS     Note: RIFAMPIN AND GENTAMICIN SHOULD NOT BE USED AS SINGLE DRUGS FOR TREATMENT OF STAPH INFECTIONS. This organism is presumed to be Clindamycin resistant based on detection of inducible Clindamycin resistance.   Report Status 04/12/2012 FINAL   Final    Organism ID, Bacteria STAPHYLOCOCCUS AUREUS   Final     Studies/Results: No results found.  Medications: I have reviewed the patient's current medications. Scheduled Meds:   . enoxaparin (LOVENOX) injection  40 mg Subcutaneous Q24H  . ferrous sulfate  325 mg Oral BID  . insulin aspart  0-15 Units Subcutaneous TID WC  . insulin aspart  0-5 Units Subcutaneous QHS  . insulin aspart  4 Units Subcutaneous TID AC  . insulin glargine  24 Units Subcutaneous QHS  . piperacillin-tazobactam (ZOSYN)  IV  3.375 g Intravenous Q8H  . vancomycin  1,250 mg Intravenous Q12H  . DISCONTD: insulin aspart  3 Units Subcutaneous TID AC  .  DISCONTD: insulin glargine  20 Units Subcutaneous QHS   Continuous Infusions:   . DISCONTD: sodium chloride 1,000 mL (04/13/12 0034)   PRN Meds:.acetaminophen, acetaminophen, HYDROcodone-acetaminophen, morphine injection, ondansetron (ZOFRAN) IV, ondansetron, technetium medronate  Assessment/Plan: Bilateral foot ulcers with cellulitis worse on the left than on the right Transitioned IV vancomycin and Zosyn to oral cefuroxime and doxycycline.  Antibiotics since 04/10/2012. ABI  on 04/11/2012 was normal bilaterally.  Continue WOC consult.  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  Will get an MRI of left lower extremity to rule out osteomyelitis.  Patient to benefit from having a diabetic boot, uncertain if that can be arranged the patient will discuss with diabetic nurse coordinator.  Diabetes type II not uncontrolled with complications.   Continue Lantus, dose increased from 20 to 24 units each bedtime, NovoLog dose increased to 4 units 3 times a day and before meals.  Aemia  Likely anemia of chronic disease.  Prophylaxis Lovenox.  CODE STATUS Full code.  Disposition Discharged the patient in 1-2 days depending on MRI findings.   LOS: 4 days  Ladawn Boullion A, MD 04/13/2012, 2:25 PM

## 2012-04-14 DIAGNOSIS — E782 Mixed hyperlipidemia: Secondary | ICD-10-CM

## 2012-04-14 DIAGNOSIS — E118 Type 2 diabetes mellitus with unspecified complications: Secondary | ICD-10-CM

## 2012-04-14 DIAGNOSIS — L97909 Non-pressure chronic ulcer of unspecified part of unspecified lower leg with unspecified severity: Secondary | ICD-10-CM

## 2012-04-14 DIAGNOSIS — L97309 Non-pressure chronic ulcer of unspecified ankle with unspecified severity: Secondary | ICD-10-CM

## 2012-04-14 DIAGNOSIS — E1165 Type 2 diabetes mellitus with hyperglycemia: Secondary | ICD-10-CM

## 2012-04-14 LAB — GLUCOSE, CAPILLARY: Glucose-Capillary: 234 mg/dL — ABNORMAL HIGH (ref 70–99)

## 2012-04-14 MED ORDER — INSULIN ASPART 100 UNIT/ML ~~LOC~~ SOLN: 4.0000 [IU] | Freq: Three times a day (TID) | SUBCUTANEOUS | Status: DC

## 2012-04-14 MED ORDER — INSULIN GLARGINE 100 UNIT/ML ~~LOC~~ SOLN: 24.0000 [IU] | Freq: Every day | SUBCUTANEOUS | Status: DC

## 2012-04-14 MED ORDER — CEFUROXIME AXETIL 500 MG PO TABS: 500.0000 mg | ORAL_TABLET | Freq: Two times a day (BID) | ORAL | Status: AC

## 2012-04-14 MED ORDER — HYDROCODONE-ACETAMINOPHEN 5-325 MG PO TABS: 1.0000 | ORAL_TABLET | Freq: Four times a day (QID) | ORAL | Status: AC | PRN

## 2012-04-14 MED ORDER — DOXYCYCLINE HYCLATE 100 MG PO TABS: 100.0000 mg | ORAL_TABLET | Freq: Two times a day (BID) | ORAL | Status: DC

## 2012-04-14 NOTE — Progress Notes (Signed)
Patient received discharge instructions with interpreter at bedside.  Case manager, Camelia Eng was also present at bedside. Went over discharge instructions with patient including follow up appointments, medications, and importance of glucose monitoring and completing antibiotics. Patient verbalized understanding and asked questions that she had. Patient belongings packed. Patient walked downstairs to be discharged home.

## 2012-04-14 NOTE — Progress Notes (Signed)
Subjective: No specific complaints.  Mainly speaking in [place].  Called a family member to help translate on the telephone.  Patient wondering if she can be discharged today.  Objective: Vital signs in last 24 hours: Filed Vitals:   04/12/12 2301 04/13/12 0622 04/13/12 2107 04/14/12 0558  BP: 115/75 134/79 115/76 119/73  Pulse: 84 84 94 107  Temp: 98 F (36.7 C) 98.2 F (36.8 C) 98.3 F (36.8 C) 98.7 F (37.1 C)  TempSrc: Oral Oral Oral Oral  Resp: 16 16 18 18   Height:      Weight:      SpO2: 92% 100% 92% 94%   Weight change:   Intake/Output Summary (Last 24 hours) at 04/14/12 1122 Last data filed at 04/14/12 0900  Gross per 24 hour  Intake    720 ml  Output      0 ml  Net    720 ml    Physical Exam: General: Awake, Oriented, No acute distress. HEENT: EOMI. Neck: Supple CV: S1 and S2 Lungs: Clear to ascultation bilaterally Abdomen: Soft, Nontender, Nondistended, +bowel sounds. Ext: Good pulses. Trace edema.  Left lateral ankle ulcer measuring approximately half centimeter in diameter.  Plantar ulcer left foot measuring centimeter in diameter.  Lab Results: No results found for this basename: NA:2,K:2,CL:2,CO2:2,GLUCOSE:2,BUN:2,CREATININE:2,CALCIUM:2,MG:2,PHOS:2 in the last 72 hours No results found for this basename: AST:2,ALT:2,ALKPHOS:2,BILITOT:2,PROT:2,ALBUMIN:2 in the last 72 hours No results found for this basename: LIPASE:2,AMYLASE:2 in the last 72 hours No results found for this basename: WBC:2,NEUTROABS:2,HGB:2,HCT:2,MCV:2,PLT:2 in the last 72 hours No results found for this basename: CKTOTAL:3,CKMB:3,CKMBINDEX:3,TROPONINI:3 in the last 72 hours No components found with this basename: POCBNP:3 No results found for this basename: DDIMER:2 in the last 72 hours No results found for this basename: HGBA1C:2 in the last 72 hours No results found for this basename: CHOL:2,HDL:2,LDLCALC:2,TRIG:2,CHOLHDL:2,LDLDIRECT:2 in the last 72 hours No results found for this  basename: TSH,T4TOTAL,FREET3,T3FREE,THYROIDAB in the last 72 hours No results found for this basename: VITAMINB12:2,FOLATE:2,FERRITIN:2,TIBC:2,IRON:2,RETICCTPCT:2 in the last 72 hours  Micro Results: Recent Results (from the past 240 hour(s))  WOUND CULTURE     Status: Normal   Collection Time   04/09/12  9:54 PM      Component Value Range Status Comment   Specimen Description FOOT   Final    Special Requests NONE   Final    Gram Stain     Final    Value: NO WBC SEEN     RARE SQUAMOUS EPITHELIAL CELLS PRESENT     RARE GRAM POSITIVE COCCI     IN PAIRS   Culture     Final    Value: ABUNDANT STAPHYLOCOCCUS AUREUS     Note: RIFAMPIN AND GENTAMICIN SHOULD NOT BE USED AS SINGLE DRUGS FOR TREATMENT OF STAPH INFECTIONS. This organism is presumed to be Clindamycin resistant based on detection of inducible Clindamycin resistance.   Report Status 04/12/2012 FINAL   Final    Organism ID, Bacteria STAPHYLOCOCCUS AUREUS   Final     Studies/Results: Mr Foot Left Wo Contrast  04/14/2012  *RADIOLOGY REPORT*  Clinical Data: Redness, swelling, and soft tissue ulcerations adjacent to the fifth metatarsal.  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  History of osteomyelitis.  MRI OF THE LEFT FOREFOOT WITHOUT CONTRAST  Technique:  Multiplanar, multisequence MR imaging was performed. No intravenous contrast was administered.  Comparison: MRI dated 01/26/2011  Findings: The patient has had prior amputation of the second toe in the head of the second metatarsal.  There is no evidence of osteomyelitis of the metatarsals or of the remaining toes.  There are no abscesses or abnormal joint effusions.  There is slight edema in the soft tissues around the heads of the third and fourth metatarsals which could represent cellulitis.  There are new focal degenerative changes between the navicular and the medial cuneiform, probably secondary to the previous toe amputation.  IMPRESSION: No evidence of  osteomyelitis.  Nonspecific soft tissue edema, mild, at the ball of the foot as described.  Original Report Authenticated By: Gwynn Burly, M.D.   Mr Ankle Left  Wo Contrast  04/14/2012  *RADIOLOGY REPORT*  Clinical Data: Previous osteomyelitis. Soft tissue ulceration adjacent to the lateral malleolus.  MRI OF THE LEFT ANKLE WITHOUT CONTRAST  Technique:  Multiplanar, multisequence MR imaging was performed. No intravenous contrast was administered.  Comparison: None.  Findings: There is slight soft tissue swelling and edema just anterior superior to the tip of the lateral malleolus consistent with focal cellulitis.  No abscess.  No osteomyelitis.  The bones of the ankle are normal except for a 5 mm area of focal chondromalacia of the distal tibia at the posterior aspect of the ankle joint.  Other osseous structures are normal.  Tendons and ligaments around the ankle are normal.  No abnormal joint effusions.  There is a tiny metallic foreign body which appears to be within the scan of the plantar lateral aspect of the heel.  IMPRESSION: No evidence of osteomyelitis.  Focal soft tissue edema and swelling superficial to the distal fibula consistent with focal cellulitis.  Tiny metallic foreign body in the soft tissues of the plantar lateral aspect of the foot, not felt to be significant.  Original Report Authenticated By: Gwynn Burly, M.D.   Nm Bone Scan 3 Phase Lower Extremity  04/13/2012  *RADIOLOGY REPORT*  Clinical Data:  Diabetic with left ankle/foot open wound.  Question osteomyelitis.  NUCLEAR MEDICINE 3-PHASE BONE SCAN  Technique:  Radionuclide angiographic images, immediate static blood pool images, and 3-hour delayed static images were obtained of the distal lower legs and feet after intravenous injection of radiopharmaceutical.  Radiopharmaceutical: 24.0 mCi Technetium 99 MDP  Comparison:  Left ankle radiographs 04/09/2012.  Left foot MRI 01/26/2011.  Findings: The dynamic images demonstrate  increased perfusion to the left foot compared with the right.  On the blood pool images, there is asymmetric activity in the left forefoot at the site of the previous second digit amputation.  This focal activity does not persist on the delayed images.  The delayed images do show low-level activity throughout the midfoot, likely Charcot mediated.  There is mild activity at the first metatarsal phalangeal joints bilaterally.  There is no focal delayed phase activity to suggest osteomyelitis.  IMPRESSION:  1.  Relatively increased perfusion to the left foot consistent with inflammation. 2.  No specific evidence of osteomyelitis. 3.  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  Original Report Authenticated By: Gerrianne Scale, M.D.    Medications: I have reviewed the patient's current medications. Scheduled Meds:    . cefUROXime  500 mg Oral BID WC  . doxycycline  100 mg Oral Q12H  . enoxaparin (LOVENOX) injection  40 mg Subcutaneous Q24H  . ferrous sulfate  325 mg Oral BID  . insulin aspart  0-15 Units Subcutaneous TID WC  . insulin aspart  0-5 Units Subcutaneous QHS  . insulin aspart  4 Units Subcutaneous TID AC  . insulin glargine  24 Units Subcutaneous QHS  .  DISCONTD: insulin aspart  3 Units Subcutaneous TID AC  . DISCONTD: insulin glargine  20 Units Subcutaneous QHS  . DISCONTD: piperacillin-tazobactam (ZOSYN)  IV  3.375 g Intravenous Q8H  . DISCONTD: vancomycin  1,250 mg Intravenous Q12H   Continuous Infusions:    . DISCONTD: sodium chloride 1,000 mL (04/13/12 0034)   PRN Meds:.acetaminophen, acetaminophen, HYDROcodone-acetaminophen, morphine injection, ondansetron (ZOFRAN) IV, ondansetron  Assessment/Plan: Bilateral foot ulcers with cellulitis worse on the left than on the right Transitioned IV vancomycin and Zosyn to oral cefuroxime and doxycycline.  Antibiotics since 04/10/2012. ABI on 04/11/2012 was normal bilaterally.  Continue WOC consult.  3 phase bone scan on 04/13/2012 showed  relatively increased perfusion to the left foot consistent with inflammation, no specific evidence for osteomyelitis, possible early midfoot Charcot changes.  MRI of the foot on 04/14/2012 showed no evidence for osteomyelitis, focal soft tissue edema swelling superficial to the distal fibula consistent with focal cellulitis, tiny metallic foreign body in the soft tissues at the plantar lateral aspect of the foot not felt to be significant.  Discussed MRI findings with Dr. Luiz Blare covering for Dr. Turner Daniels, was instructed to have the patient followup with them in clinic.  Dr. Luiz Blare instructed to hold off on diabetic boot until evaluated in clinic.  Diabetes type II not uncontrolled with complications.   Continue Lantus, 24 units each bedtime, NovoLog dose increased to 4 units 3 times a day and before meals.  Further titration and management as outpatient.  Patient may benefit from being started on ACE inhibitor or ARB for kidney protection which can be done as outpatient.  Patient has soft blood pressures in the hospital.  Discussed with case manager in arranging for her primary care physician followup and medication assistance if the patient needs any prior to discharge.  Aemia  Likely anemia of chronic disease.  Prophylaxis Lovenox.  CODE STATUS Full code.  Disposition Discharge the patient today.   LOS: 5 days  Loudon Krakow A, MD 04/14/2012, 11:22 AM

## 2012-04-14 NOTE — Progress Notes (Signed)
CARE MANAGEMENT NOTE 04/14/2012  Patient:  [name], [name]   Account Number:  [id number]  Date Initiated:  04/14/2012  Documentation initiated by:  Deondrae Mcgrail  Subjective/Objective Assessment:   46 yo female admitted 04/09/12 with diabetic foot ulcer, left foot pain     Action/Plan:   D/C when medically stable   Anticipated DC Date:  04/14/2012   Anticipated DC Plan:  HOME/SELF CARE      DC Planning Services  CM consult              Status of service:  Completed, signed off   Discharge Disposition:  HOME/SELF CARE    Comments:  04/14/12, Kathi Der RNC-MNN, BSN, [phone], CM asked to see pt for possible medciation assistance and PCP.  Per pharmacy, pt is not eligible for indigent funds.  List of self pay providers provided to pt for PCP.  All of this information communicated to pt via interpreter as well as resources for medication assistance, i.e. $4 [employer] meds, needymeds.com.  Pt states through interpreter that she is active with [facility] and has an orange card.  Pt to follow up at [facility].

## 2012-04-14 NOTE — Discharge Summary (Signed)
Discharge Summary  [name] MR#: [id number]  DOB:23-Apr-1966  Date of Admission: 04/09/2012 Date of Discharge: 04/14/2012  Patient's PCP: No primary provider on file.  Attending Physician:Tyreik Delahoussaye A  Consults: Wound Care Dr. Valentino Hue, Ortho by telephone.  Discharge Diagnoses: Active Problems:  DIABETES MELLITUS, TYPE II  DIABETIC FOOT ULCER, LEFT Anemia  Brief Admitting History and Physical 46 year old female with history of uncontrolled diabetes, vaginal carcinoma history, poorly healing left foot ulcer and history of osteomyelitis who presents with worsening swelling, redness and pain at the site of left foot ulcer with some drainage on 04/09/2012.   Discharge Medications Medication List  As of 04/14/2012 11:32 AM   STOP taking these medications         doxycycline 100 MG capsule      ibuprofen 400 MG tablet         TAKE these medications         cefUROXime 500 MG tablet   Commonly known as: CEFTIN   Take 1 tablet (500 mg total) by mouth 2 (two) times daily with a meal.      doxycycline 100 MG tablet   Commonly known as: VIBRA-TABS   Take 1 tablet (100 mg total) by mouth every 12 (twelve) hours.      ferrous sulfate 325 (65 FE) MG tablet   Take 325 mg by mouth 2 (two) times daily.      HYDROcodone-acetaminophen 5-325 MG per tablet   Commonly known as: NORCO   Take 1 tablet by mouth every 6 (six) hours as needed for pain.      insulin aspart 100 UNIT/ML injection   Commonly known as: novoLOG   Inject 4 Units into the skin 3 (three) times daily before meals.      insulin glargine 100 UNIT/ML injection   Commonly known as: LANTUS   Inject 24 Units into the skin at bedtime.            Hospital Course: Bilateral foot ulcers with cellulitis worse on the left than on the right Initially started on IV vancomycin and Zosyn which was transitioned to oral cefuroxime and doxycycline.  Antibiotics since 04/10/2012. ABI on 04/11/2012 was normal bilaterally.   Wound care consult evaluated the patient.  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  MRI of the foot on 04/14/2012 showed no evidence for osteomyelitis, focal soft tissue edema swelling superficial to the distal fibula consistent with focal cellulitis, tiny metallic foreign body in the soft tissues at the plantar lateral aspect of the foot not felt to be significant.  Discussed MRI findings with Dr. Luiz Blare covering for Dr. Turner Daniels who performed amputation about a year ago, was instructed to have the patient followup with them in clinic.  Dr. Luiz Blare instructed to hold off on diabetic boot until evaluated in clinic.  Diabetes type II not uncontrolled with complications.   Continue Lantus, 24 units each bedtime, NovoLog dose increased to 4 units 3 times a day and before meals.  Hemoglobin A1c on 04/10/2012 was 11.2 suggesting not good control as outpatient. Further titration and management as outpatient.  Patient may benefit from being started on ACE inhibitor or ARB for kidney protection which can be done as outpatient.  Patient has soft blood pressures in the hospital.  Discussed with case manager in arranging for her primary care physician followup and medication assistance if the patient  needs any prior to discharge.  Aemia  Likely anemia of chronic disease.  Day of Discharge BP 119/73  Pulse 107  Temp(Src) 98.7 F (37.1 C) (Oral)  Resp 18  Ht 5\' 6"  (1.676 m)  Wt 59.104 kg (130 lb 4.8 oz)  BMI 21.03 kg/m2  SpO2 94%  Results for orders placed during the hospital encounter of 04/09/12 (from the past 48 hour(s))  GLUCOSE, CAPILLARY     Status: Abnormal   Collection Time   04/12/12 12:28 PM      Component Value Range Comment   Glucose-Capillary 187 (*) 70 - 99 (mg/dL)   GLUCOSE, CAPILLARY     Status: Abnormal   Collection Time   04/12/12  5:03  PM      Component Value Range Comment   Glucose-Capillary 158 (*) 70 - 99 (mg/dL)   GLUCOSE, CAPILLARY     Status: Abnormal   Collection Time   04/12/12 10:48 PM      Component Value Range Comment   Glucose-Capillary 236 (*) 70 - 99 (mg/dL)   GLUCOSE, CAPILLARY     Status: Abnormal   Collection Time   04/13/12  7:36 AM      Component Value Range Comment   Glucose-Capillary 170 (*) 70 - 99 (mg/dL)   GLUCOSE, CAPILLARY     Status: Abnormal   Collection Time   04/13/12 11:33 AM      Component Value Range Comment   Glucose-Capillary 193 (*) 70 - 99 (mg/dL)   GLUCOSE, CAPILLARY     Status: Normal   Collection Time   04/13/12  5:17 PM      Component Value Range Comment   Glucose-Capillary 94  70 - 99 (mg/dL)   GLUCOSE, CAPILLARY     Status: Abnormal   Collection Time   04/13/12  9:41 PM      Component Value Range Comment   Glucose-Capillary 234 (*) 70 - 99 (mg/dL)     Dg Ankle Complete Left  04/09/2012  *RADIOLOGY REPORT*  Clinical Data: Soft tissue wound of the lateral ankle.  LEFT ANKLE COMPLETE - 3+ VIEW  Comparison: Plain film left foot 01/19/2012.  Findings: A skin ulceration is seen over the lateral malleolus with underlying soft tissue swelling.  No bony destructive change or periosteal reaction is identified.  No soft tissue gas.  Defect of the medial malleolus is compatible with old trauma.  IMPRESSION: Skin wound without evidence of underlying osteomyelitis.  Original Report Authenticated By: Bernadene Bell. Maricela Curet, M.D.   Mr Foot Left Wo Contrast  04/14/2012  *RADIOLOGY REPORT*  Clinical Data: Redness, swelling, and soft tissue ulcerations adjacent to the fifth metatarsal.  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  History of osteomyelitis.  MRI OF THE LEFT FOREFOOT WITHOUT CONTRAST  Technique:  Multiplanar, multisequence MR imaging was performed. No intravenous contrast was administered.  Comparison: MRI dated 01/26/2011  Findings: The patient has had prior  amputation of the second toe in the head of the second metatarsal.  There is no evidence of osteomyelitis of the metatarsals or of the remaining toes.  There are no abscesses or abnormal joint effusions.  There is slight edema in the soft tissues around the heads of the third and fourth metatarsals which could represent cellulitis.  There are new focal degenerative changes between the navicular and the medial cuneiform, probably secondary to the previous toe amputation.  IMPRESSION: No evidence of osteomyelitis.  Nonspecific soft tissue edema, mild, at the ball  of the foot as described.  Original Report Authenticated By: Gwynn Burly, M.D.   Mr Ankle Left  Wo Contrast  04/14/2012  *RADIOLOGY REPORT*  Clinical Data: Previous osteomyelitis. Soft tissue ulceration adjacent to the lateral malleolus.  MRI OF THE LEFT ANKLE WITHOUT CONTRAST  Technique:  Multiplanar, multisequence MR imaging was performed. No intravenous contrast was administered.  Comparison: None.  Findings: There is slight soft tissue swelling and edema just anterior superior to the tip of the lateral malleolus consistent with focal cellulitis.  No abscess.  No osteomyelitis.  The bones of the ankle are normal except for a 5 mm area of focal chondromalacia of the distal tibia at the posterior aspect of the ankle joint.  Other osseous structures are normal.  Tendons and ligaments around the ankle are normal.  No abnormal joint effusions.  There is a tiny metallic foreign body which appears to be within the scan of the plantar lateral aspect of the heel.  IMPRESSION: No evidence of osteomyelitis.  Focal soft tissue edema and swelling superficial to the distal fibula consistent with focal cellulitis.  Tiny metallic foreign body in the soft tissues of the plantar lateral aspect of the foot, not felt to be significant.  Original Report Authenticated By: Gwynn Burly, M.D.   Nm Bone Scan 3 Phase Lower Extremity  04/13/2012  *RADIOLOGY REPORT*   Clinical Data:  Diabetic with left ankle/foot open wound.  Question osteomyelitis.  NUCLEAR MEDICINE 3-PHASE BONE SCAN  Technique:  Radionuclide angiographic images, immediate static blood pool images, and 3-hour delayed static images were obtained of the distal lower legs and feet after intravenous injection of radiopharmaceutical.  Radiopharmaceutical: 24.0 mCi Technetium 99 MDP  Comparison:  Left ankle radiographs 04/09/2012.  Left foot MRI 01/26/2011.  Findings: The dynamic images demonstrate increased perfusion to the left foot compared with the right.  On the blood pool images, there is asymmetric activity in the left forefoot at the site of the previous second digit amputation.  This focal activity does not persist on the delayed images.  The delayed images do show low-level activity throughout the midfoot, likely Charcot mediated.  There is mild activity at the first metatarsal phalangeal joints bilaterally.  There is no focal delayed phase activity to suggest osteomyelitis.  IMPRESSION:  1.  Relatively increased perfusion to the left foot consistent with inflammation. 2.  No specific evidence of osteomyelitis. 3.  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  Original Report Authenticated By: Gerrianne Scale, M.D.     Disposition: Home with followup as indicated below.  Diet: Diabetic diet  Activity: Resume as tolerated   Follow-up Appts: Discharge Orders    Future Orders Please Complete By Expires   Diet Carb Modified      Increase activity slowly      Discharge instructions      Comments:   Please take your diabetic medications as scheduled. Please take your antibiotics until gone. Check your feet daily, if wound getting worse come back to the emergency department or go to your orthopedic physician's office for further management. Followup with your primary care physician or clinic in one week. Followup with Dr. Turner Daniels (ortho) in 1 week.      TESTS THAT NEED FOLLOW-UP None  Time  spent on discharge, talking to the patient, and coordinating care: 35 mins.   Signed: Cristal Ford, MD 04/14/2012, 11:32 AM

## 2012-04-15 LAB — GLUCOSE, CAPILLARY
Glucose-Capillary: 253 mg/dL — ABNORMAL HIGH (ref 70–99)
Glucose-Capillary: 257 mg/dL — ABNORMAL HIGH (ref 70–99)

## 2012-05-17 ENCOUNTER — Encounter: Payer: Self-pay | Admitting: Internal Medicine

## 2012-05-17 ENCOUNTER — Ambulatory Visit (INDEPENDENT_AMBULATORY_CARE_PROVIDER_SITE_OTHER): Payer: Self-pay | Admitting: Internal Medicine

## 2012-05-17 VITALS — BP 128/77 | HR 96 | Temp 98.4°F | Ht 60.0 in | Wt 133.2 lb

## 2012-05-17 DIAGNOSIS — E1169 Type 2 diabetes mellitus with other specified complication: Secondary | ICD-10-CM

## 2012-05-17 DIAGNOSIS — E11621 Type 2 diabetes mellitus with foot ulcer: Secondary | ICD-10-CM

## 2012-05-17 DIAGNOSIS — L97509 Non-pressure chronic ulcer of other part of unspecified foot with unspecified severity: Secondary | ICD-10-CM

## 2012-05-17 NOTE — Progress Notes (Signed)
Due to language barrier, an interpreter was present during the history-taking and subsequent discussion (and for part of the physical exam) with this patient. Interpreter Wyvonnia Dusky 05/17/2012 for Dr, Orvan Falconer

## 2012-05-17 NOTE — Progress Notes (Signed)
Patient ID: [name], female   DOB: [birth year], 46 y.o.   MRN: [id number]    [facility] for Infectious Disease  Patient Active Problem List  Diagnosis  . DIABETES MELLITUS, TYPE II  . ANEMIA-NOS  . ACUTE OSTEOMYELITIS, ANKLE AND FOOT  . MALIGNANT NEOPLASM OF VAGINA  . Diabetic foot ulcers    Patient's Medications  New Prescriptions   No medications on file  Previous Medications   FERROUS SULFATE 325 (65 FE) MG TABLET    Take 325 mg by mouth 2 (two) times daily.     INSULIN ASPART (NOVOLOG) 100 UNIT/ML INJECTION    Inject 4 Units into the skin 3 (three) times daily before meals.   INSULIN GLARGINE (LANTUS) 100 UNIT/ML INJECTION    Inject 24 Units into the skin at bedtime.  Modified Medications   No medications on file  Discontinued Medications   DOXYCYCLINE (VIBRA-TABS) 100 MG TABLET    Take 1 tablet (100 mg total) by mouth every 12 (twelve) hours.    Subjective: Ms. [name] is in for a hospital followup visit. She was seen by my partner a lower year ago for left second toe osteomyelitis that was cured after amputation and a course of antibiotics. She was readmitted to the hospital from May 11-16 of this year for a possibly infected plantar ulcer of her left foot. The wound culture grew methicillin sensitive staph aureus. An MRI scan did not show any evidence of deep abscess or osteomyelitis. She was treated in the hospital with IV vancomycin and Zosyn and then switched to oral cefuroxime and doxycycline which she completed shortly after discharge. She states that she recently trimmed the callus around the plantar ulcer recalls when she stands the callus forms "a ball" under her foot. She has a ulcer on the dorsum of her right foot but does not know how or exactly when that started. After discharge she tried to see her orthopedic surgeon but was told that she would need a new referral. She states that she does not have any regular primary medical care. She received her insulin from  [facility] community [facility].  Objective: Temp: 98.4 F (36.9 C) (06/18 1428) Temp src: Oral (06/18 1428) BP: 128/77 mmHg (06/18 1428) Pulse Rate: 96  (06/18 1428)  General: She appears to be in no distress in good spirits Skin: no rash Lungs: clear  Cor: reg S1 and S2 without murmurs  Feet: She has a large thickened callus under the left third metatarsal head. The surface of the callus has been trimmed to flush. There is no drainage, odor, surrounding cellulitis or fluctuance. There is a poor ulcer on the dorsum of her right foot without any evidence of infection.    Assessment: She has poorly controlled diabetes with a recent hemoglobin A1c of 11.2, no ongoing primary care and chronic foot ulcers. I do not see any evidence of infection at this time and we'll continue her off of antibiotics.   Plan: 1. Continue observation off of antibiotics  2. Attempt to help her reestablish primary care    Cliffton Asters, MD [facility] [phone] pager   [phone] cell 05/17/2012, 2:50 PM

## 2012-05-24 ENCOUNTER — Encounter (HOSPITAL_BASED_OUTPATIENT_CLINIC_OR_DEPARTMENT_OTHER): Payer: Self-pay | Attending: General Surgery

## 2012-05-24 DIAGNOSIS — S98139A Complete traumatic amputation of one unspecified lesser toe, initial encounter: Secondary | ICD-10-CM | POA: Insufficient documentation

## 2012-05-24 DIAGNOSIS — L97509 Non-pressure chronic ulcer of other part of unspecified foot with unspecified severity: Secondary | ICD-10-CM | POA: Insufficient documentation

## 2012-05-24 DIAGNOSIS — E1169 Type 2 diabetes mellitus with other specified complication: Secondary | ICD-10-CM | POA: Insufficient documentation

## 2012-05-24 DIAGNOSIS — Z794 Long term (current) use of insulin: Secondary | ICD-10-CM | POA: Insufficient documentation

## 2012-05-24 DIAGNOSIS — L84 Corns and callosities: Secondary | ICD-10-CM | POA: Insufficient documentation

## 2012-05-24 DIAGNOSIS — Z79899 Other long term (current) drug therapy: Secondary | ICD-10-CM | POA: Insufficient documentation

## 2012-05-24 NOTE — Progress Notes (Signed)
[facility]  NAME:  [name], [name]                 ACCOUNT NO.:  [id number]  MEDICAL RECORD NO.:  [id number]      DATE OF BIRTH:  11/17/1966  PHYSICIAN:  Ardath Sax, M.D.           VISIT DATE:                                  OFFICE VISIT   This is a 46 year old Hispanic lady who was here with an interpreter. She has a history of diabetes and has a diabetic foot ulcer that has been present for about a year and a half.  On the right side there is a small superficial one on the dorsum of her foot.  The more serious one is on the plantar aspect of her left foot.  She has already had an amputation of her right second toe.  She has good pulses and an x-ray has been taken it which was negative for osteo.  Today, I debrided callus from both of these areas.  This lady's medicines include; she is on doxycycline, she is also on ferrous sulfate, she is on Norco for pain and even though she is a type 2 diabetic, she is on NovoLog and Lantus insulin.  Today, she is afebrile.  Her pulse is 70, her respirations 16, blood pressure is 120/80.  She has good pulses and I do not feel that we have got an ischemic problem.  Today, I put collagen dressings on and I think she is a good candidate for a total contact cast which we will plan on putting on next week on the left foot and I think that should help heal this ulcer.  So she will come back in a week for a total contact cast.     Ardath Sax, M.D.     PP/MEDQ  D:  05/24/2012  T:  05/24/2012  Job:  [id number]

## 2012-05-31 ENCOUNTER — Ambulatory Visit (HOSPITAL_COMMUNITY)
Admission: RE | Admit: 2012-05-31 | Discharge: 2012-05-31 | Disposition: A | Discharge status: Home / Self Care | Payer: Self-pay | Source: Ambulatory Visit | Attending: General Surgery | Admitting: General Surgery

## 2012-05-31 ENCOUNTER — Other Ambulatory Visit (HOSPITAL_BASED_OUTPATIENT_CLINIC_OR_DEPARTMENT_OTHER): Payer: Self-pay | Admitting: General Surgery

## 2012-05-31 ENCOUNTER — Encounter (HOSPITAL_BASED_OUTPATIENT_CLINIC_OR_DEPARTMENT_OTHER): Payer: Self-pay | Attending: General Surgery

## 2012-05-31 DIAGNOSIS — L97509 Non-pressure chronic ulcer of other part of unspecified foot with unspecified severity: Secondary | ICD-10-CM | POA: Insufficient documentation

## 2012-05-31 DIAGNOSIS — E1169 Type 2 diabetes mellitus with other specified complication: Secondary | ICD-10-CM | POA: Insufficient documentation

## 2012-05-31 DIAGNOSIS — Y849 Medical procedure, unspecified as the cause of abnormal reaction of the patient, or of later complication, without mention of misadventure at the time of the procedure: Secondary | ICD-10-CM | POA: Insufficient documentation

## 2012-05-31 DIAGNOSIS — T8189XA Other complications of procedures, not elsewhere classified, initial encounter: Secondary | ICD-10-CM

## 2012-06-21 ENCOUNTER — Encounter (HOSPITAL_BASED_OUTPATIENT_CLINIC_OR_DEPARTMENT_OTHER): Payer: Self-pay

## 2012-06-21 NOTE — Progress Notes (Signed)
[facility]  NAME:  [name], [name]                 ACCOUNT NO.:  [id number]  MEDICAL RECORD NO.:  [id number]      DATE OF BIRTH:  [birth year]  PHYSICIAN:  Joanne Gavel, M.D.         VISIT DATE:  06/21/2012                                  OFFICE VISIT   ADMISSION DIAGNOSIS:  Diabetic foot ulcer, left foot.  DISCHARGE DIAGNOSIS:  Diabetic foot ulcer, left foot.  COURSE OF TREATMENT:  The patient was first seen on May 24, 2012.  She had a diabetic foot ulcer.  X-rays revealed no signs of bone involvement.  She was treated with offloading with a TCC and the wound healed completely.  DISCHARGE INSTRUCTIONS:  The patient's interpreter was not here and we had difficulty discussing this with her, but we have recommended offloading with foot pads and probably diabetic shoes and she is to return p.r.n.     Joanne Gavel, M.D.     RA/MEDQ  D:  06/21/2012  T:  06/21/2012  Job:  [id number]

## 2012-10-05 ENCOUNTER — Telehealth: Payer: Self-pay | Admitting: Radiology

## 2012-10-05 ENCOUNTER — Ambulatory Visit (INDEPENDENT_AMBULATORY_CARE_PROVIDER_SITE_OTHER): Payer: Managed Care, Other (non HMO) | Admitting: Emergency Medicine

## 2012-10-05 VITALS — BP 85/60 | HR 80 | Temp 97.9°F | Resp 18 | Ht 60.5 in | Wt 136.0 lb

## 2012-10-05 DIAGNOSIS — E119 Type 2 diabetes mellitus without complications: Secondary | ICD-10-CM

## 2012-10-05 DIAGNOSIS — Z0000 Encounter for general adult medical examination without abnormal findings: Secondary | ICD-10-CM

## 2012-10-05 DIAGNOSIS — C579 Malignant neoplasm of female genital organ, unspecified: Secondary | ICD-10-CM

## 2012-10-05 DIAGNOSIS — S81809A Unspecified open wound, unspecified lower leg, initial encounter: Secondary | ICD-10-CM

## 2012-10-05 DIAGNOSIS — Z23 Encounter for immunization: Secondary | ICD-10-CM

## 2012-10-05 LAB — COMPREHENSIVE METABOLIC PANEL
ALT: 16 U/L (ref 0–35)
AST: 12 U/L (ref 0–37)
Alkaline Phosphatase: 108 U/L (ref 39–117)
Calcium: 9.3 mg/dL (ref 8.4–10.5)
Chloride: 100 mEq/L (ref 96–112)
Creat: 0.68 mg/dL (ref 0.50–1.10)
Potassium: 4.7 mEq/L (ref 3.5–5.3)

## 2012-10-05 LAB — GLUCOSE, POCT (MANUAL RESULT ENTRY): POC Glucose: 206 mg/dl — AB (ref 70–99)

## 2012-10-05 LAB — POCT CBC
Granulocyte percent: 54.9 %G (ref 37–80)
HCT, POC: 39.2 % (ref 37.7–47.9)
Hemoglobin: 11.9 g/dL — AB (ref 12.2–16.2)
POC Granulocyte: 3.2 (ref 2–6.9)
RDW, POC: 13.1 %

## 2012-10-05 LAB — LIPID PANEL
HDL: 47 mg/dL (ref >39)
LDL Cholesterol: 112 mg/dL — ABNORMAL HIGH (ref 0–99)
Triglycerides: 205 mg/dL — ABNORMAL HIGH (ref <150)
VLDL: 41 mg/dL — ABNORMAL HIGH (ref 0–40)

## 2012-10-05 LAB — POCT GLYCOSYLATED HEMOGLOBIN (HGB A1C): Hemoglobin A1C: 11.1

## 2012-10-05 MED ORDER — DOXYCYCLINE HYCLATE 100 MG PO CAPS: 100.0000 mg | ORAL_CAPSULE | Freq: Two times a day (BID) | ORAL | Status: DC

## 2012-10-05 MED ORDER — INSULIN ASPART 100 UNIT/ML ~~LOC~~ SOLN: 10.0000 [IU] | Freq: Three times a day (TID) | SUBCUTANEOUS | Status: DC

## 2012-10-05 MED ORDER — INSULIN GLARGINE 100 UNIT/ML ~~LOC~~ SOLN: 20.0000 [IU] | Freq: Every day | SUBCUTANEOUS | Status: DC

## 2012-10-05 NOTE — Patient Instructions (Addendum)
We will be making an appointment to see a diabetic specialist regarding her insulin-dependent diabetes. We will be making an appointment to see Dr. Lily Peer an OB/GYN specialist to followup on her GYN cancer

## 2012-10-05 NOTE — Progress Notes (Signed)
@UMFCLOGO @  Patient ID: [name] MRN: [id number], DOB: [birth year], 46 y.o. Date of Encounter: 10/05/2012, 1:39 PM  Primary Physician: Lehman Prom, NP  Chief Complaint: Physical (CPE)  HPI: 46 y.o. y/o female with history of noted below here for CPE. She is an insulin-dependent diabetic for approximately 15 years.  Doing well. No issues/complaints.  LMP:  Pap: MMG: Review of Systems:  Consitutional: No fever, chills, fatigue, night sweats, lymphadenopathy, or weight changes. Eyes: No visual changes, eye redness, or discharge. ENT/Mouth: Ears: No otalgia, tinnitus, hearing loss, discharge. Nose: No congestion, rhinorrhea, sinus pain, or epistaxis. Throat: No sore throat, post nasal drip, or teeth pain. Cardiovascular: No CP, palpitations, diaphoresis, DOE, edema, orthopnea, PND. Respiratory: No shortness of breath no dyspnea on exertion   Gastrointestinal: No anorexia, dysphagia, reflux, pain, nausea, vomiting, hematemesis, diarrhea, constipation, BRBPR, or melena. Breast: No discharge, pain, swelling, or mass. Genitourinary: No dysuria, frequency, urgency, hematuria, incontinence, nocturia, amenorrhea, vaginal discharge, pruritis, burning, abnormal bleeding, or pain. Musculoskeletal: No decreased ROM, myalgias, stiffness, joint swelling, or weakness. Skin: No rash, erythema, lesion changes, pain, warmth, jaundice, or pruritis. Neurological: No headache, dizziness, syncope, seizures, tremors, memory loss, coordination problems, or paresthesias. Psychological: No anxiety, depression, hallucinations, SI/HI. Endocrine: No fatigue, polydipsia, polyphagia, polyuria, or known diabetes. All other systems were reviewed and are otherwise negative.  Past Medical History  Diagnosis Date  . 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 Laurette Schimke), Cysto performed by Dr. Katherine Roan 12/2009 with evac of clots and bx and fulguration. // H/O stage 2 SCC of the vulva  . Anemia     2/2 blood loss  . Diabetes mellitus type 2, uncontrolled DX: 2001  . 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 Diabetic foot ulcers   . Cataract      Past Surgical History  Procedure Date  . Radical wide local excision of the vulva and right nguinal lymph node dissection 10/2003    Dr. Stanford Breed  . Uterine dilatation and currettage   . Labial mass excision 07/2003    Dr. Elana Alm  . 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 Turner Daniels  . Irrigation and debridement of left foot with removal of left 01/29/2011    Dr. Turner Daniels  . Tubal ligation     Home Meds:  Prior to Admission medications   Medication Sig Start Date End Date Taking? Authorizing Provider  insulin glargine (LANTUS) 100 UNIT/ML injection Inject 24 Units into the skin at bedtime. 04/14/12 04/14/13 Yes Srikar Cherlynn Kaiser, MD  ferrous sulfate 325 (65 FE) MG tablet Take 325 mg by mouth 2 (two) times daily.      Historical Provider, MD  insulin aspart (NOVOLOG) 100 UNIT/ML injection Inject 4 Units into the skin 3 (three) times daily before meals. 04/14/12 04/14/13  Cristal Ford, MD    Allergies: No Known Allergies  History   Social History  . Marital Status: Married    Spouse Name: N/A    Number of Children: N/A  . Years of Education: N/A   Occupational History  . Not on file.   Social History Main Topics  . Smoking status: Never Smoker   . Smokeless tobacco: Never Used  . Alcohol Use: No  . Drug Use: No  .  Sexually Active: Yes   Other Topics Concern  . Not on file   Social History Narrative  . No narrative on file    Family History  Problem Relation Age of Onset  . Diabetes Mother   . Diabetes Father   . Diabetes Brother     Physical Exam  Blood pressure 85/60, pulse 80, temperature  97.9 F (36.6 C), temperature source Oral, resp. rate 18, height 5' 0.5" (1.537 m), weight 136 lb (61.689 kg)., Body mass index is 26.12 kg/(m^2). General: Well developed, well nourished, in no acute distress. HEENT: Normocephalic, atraumatic. Conjunctiva pink, sclera non-icteric. Pupils 2 mm constricting to 1 mm, round, regular, and equally reactive to light and accomodation. EOMI. Internal auditory canal clear. TMs with good cone of light and without pathology. Nasal mucosa pink. Nares are without discharge. No sinus tenderness. Oral mucosa pink. Dentition . Pharynx without exudate.   Neck: Supple. Trachea midline. No thyromegaly. Full ROM. No lymphadenopathy. Lungs: Clear to auscultation bilaterally without wheezes, rales, or rhonchi. Breathing is of normal effort and unlabored. Cardiovascular: RRR with S1 S2. No murmurs, rubs, or gallops appreciated. Distal pulses 2+ symmetrically. No carotid or abdominal bruits.  Breast: No masses were palpable  Abdomen: Soft, non-tender, non-distended with normoactive bowel sounds. No hepatosplenomegaly or masses. No rebound/guarding. No CVA tenderness. Without hernias.  Genitourinary: There is evidence of previous surgery of the right labia. The uterus itself is normal size and there are no adnexal masses. I did not feel any suprapubic mass on pelvic exam    Musculoskeletal: Full range of motion and 5/5 strength throughout. Without swelling, atrophy, tenderness, crepitus, or warmth. Extremities without clubbing, cyanosis, or edema. Calves supple. Skin: Warm and moist without erythema, ecchymosis, wounds, or rash. Neuro: A+Ox3. CN II-XII grossly intact. Moves all extremities spontaneously. Full sensation throughout. Normal gait. DTR 2+ throughout upper and lower extremities. Finger to nose intact. Psych:  Responds to questions appropriately with a normal affect.   Results for orders placed in visit on 10/05/12  POCT CBC      Component Value Range   WBC  5.9  4.6 - 10.2 K/uL   Lymph, poc 2.3  0.6 - 3.4   POC LYMPH PERCENT 38.3  10 - 50 %L   MID (cbc) 0.4  0 - 0.9   POC MID % 6.8  0 - 12 %M   POC Granulocyte 3.2  2 - 6.9   Granulocyte percent 54.9  37 - 80 %G   RBC 4.10  4.04 - 5.48 M/uL   Hemoglobin 11.9 (*) 12.2 - 16.2 g/dL   HCT, POC 16.1  09.6 - 47.9 %   MCV 95.6  80 - 97 fL   MCH, POC 29.0  27 - 31.2 pg   MCHC 30.4 (*) 31.8 - 35.4 g/dL   RDW, POC 04.5     Platelet Count, POC 409  142 - 424 K/uL   MPV 7.7  0 - 99.8 fL  GLUCOSE, POCT (MANUAL RESULT ENTRY)      Component Value Range   POC Glucose 206 (*) 70 - 99 mg/dl  POCT GLYCOSYLATED HEMOGLOBIN (HGB A1C)      Component Value Range   Hemoglobin A1C 11.1         Assessment/Plan:  46 y.o. y/o female here for CPE.. 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 She had been under the direction of care by Dr. Clelia Croft.but refuses to go back to see him. I  spoke with Dr. Lily Peer who recommended we get a GYN oncologist at [facility] that comes to the [facility]  to be involved in the patient's care. We'll make the referral there. Apparently she has not completed her therapy that was recommended. She has callus formation and is already lost a toe on her right foot and has an ulceration over the lateral malleolus of the left foot which I feel is secondary to poorly controlled diabetes. We have made a referral to the endocrinologist to help with this there is a definite language area which makes communication difficult. I did do a culture of the wound on her left lateral malleolus and placed on doxy for this. Hopefully she will follow through with her cancer treatment. Updated on flu vaccine and DTaP today.  -  Signed, Earl Lites, MD 10/05/2012 1:39 PM

## 2012-10-05 NOTE — Telephone Encounter (Signed)
Called Dr Lily Peer office, for him to call Dr Cleta Alberts concerning patient. Dr Cleta Alberts has spoken to Dr Lily Peer.

## 2012-10-05 NOTE — Assessment & Plan Note (Signed)
Patient is on short acting insulin as well as Lantus for control of diabetes

## 2012-10-06 LAB — MICROALBUMIN, URINE: Microalb, Ur: 2.49 mg/dL — ABNORMAL HIGH (ref 0.00–1.89)

## 2012-10-07 LAB — PAP IG (IMAGE GUIDED)

## 2012-10-10 ENCOUNTER — Telehealth: Payer: Self-pay | Admitting: Emergency Medicine

## 2012-10-10 MED ORDER — CIPROFLOXACIN HCL 500 MG PO TABS: 500.0000 mg | ORAL_TABLET | Freq: Two times a day (BID) | ORAL | Status: DC

## 2012-10-10 NOTE — Addendum Note (Signed)
Addended byCaffie Damme on: 10/10/2012 11:24 AM   Modules accepted: Orders

## 2012-10-10 NOTE — Telephone Encounter (Signed)
[name] is going to speak to the husband about the cultures, she will also advise of the appt with the oncologist. [facility] to [facility], patient needs to arrive at 200 for the 2:30 appt, and is encouraged to use the valet parking since parking is limited.

## 2012-10-10 NOTE — Telephone Encounter (Signed)
Please call and check and be sure this patient has an appointment to see GYN specialist that comes here from [facility] that  does female oncology.

## 2012-10-10 NOTE — Telephone Encounter (Signed)
Nov 21 2:30 is the appt, husband coming in at 3pm

## 2012-10-11 ENCOUNTER — Other Ambulatory Visit: Payer: Self-pay | Admitting: Emergency Medicine

## 2012-10-20 ENCOUNTER — Ambulatory Visit: Payer: 59 | Attending: Gynecologic Oncology | Admitting: Gynecologic Oncology

## 2012-10-20 ENCOUNTER — Encounter: Payer: Self-pay | Admitting: Gynecologic Oncology

## 2012-10-20 VITALS — BP 104/60 | HR 64 | Temp 99.1°F | Resp 16 | Ht 60.71 in | Wt 138.3 lb

## 2012-10-20 DIAGNOSIS — C519 Malignant neoplasm of vulva, unspecified: Secondary | ICD-10-CM | POA: Insufficient documentation

## 2012-10-20 DIAGNOSIS — C539 Malignant neoplasm of cervix uteri, unspecified: Secondary | ICD-10-CM | POA: Insufficient documentation

## 2012-10-20 NOTE — Patient Instructions (Addendum)
Follow up in 3 months  Thank you very much [name] for allowing me to provide care for you today.  I appreciate your confidence in choosing our Gynecologic Oncology team.  If you have any questions about your visit today please call our office and we will get back to you as soon as possible.  [name]. Havah Ammon MD., PhD Gynecologic Oncology

## 2012-10-20 NOTE — Progress Notes (Signed)
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   HISTORY OF PRESENT ILLNESS: This is a 46 year old who underwent a radical wide local excision of the right vulva and right inguinal lymph  node dissection for stage II vulvar cancer in 2005. She has been without evidence of disease of that cancer since. She has, however, lost to follow up and presented in the hospital in March 2011, with inability to void, vaginal bleeding, and weight loss. Physical examination demonstrated presence of 11.5 x 5.8 x 10.5 mass involving the vagina extending into the anterior bladder. A bladder biopsy was positive for infiltrative squamous cell carcinoma, presumptive etiology of the cervix. She received chemoradiation and only received 30 Gy in 12 fractions with concurrent cisplatin therapy with a complete response. The note from Dr. Clelia Croft dated October 03, 2010, is appreciated. It is also evident that she saw a PET scan was obtained on February 07, 2011, which demonstrated a slightly enlarged left inguinal lymph node with FGD uptake, SUVmax was 2.6.   See if this lymph node in June of 2012 was within normal limits.  [name] has been  noncompliant with her visits.Marland Kitchen  test collected by her primary care physician on 10/07/2012 was within normal limits   PAST MEDICAL HISTORY:  1. Stage II vulvar cancer, diagnosed in 2005.  2. Stage IV cervical cancer, diagnosed in 2011.  3. Insulin-dependent diabetes mellitus.   PAST SURGICAL HISTORY: No interval changes.  REVIEW OF SYSTEMS: No headache cough shortness of breath chest pain. No nausea, vomiting, fever, chills, reports a good appetite with weight gain no constipation or diarrhea. No vaginal bleeding or discharge no adenopathy feels very well otherwise 10 point review of systems is noncontributory  SOCIAL HISTORY: Jehovah's Witness  PHYSICAL EXAMINATION: GENERAL: Well-developed female, in no acute distress.  VITAL  SIGNS: BP 104/60  Pulse 64  Temp 99.1 F (37.3 C)  Resp 16  Ht 5' 0.71" (1.542 m)  Wt 138 lb 4.8 oz (62.732 kg)  BMI 26.38 kg/m2 CHEST: Clear to auscultation.  LYMPH NODE SURVEY: No cervical, supraclavicular, or inguinal adenopathy.  ABDOMEN: Soft, nontender, obese no fluid wave or palpable masses PELVIC: Normal external genitalia, Bartholin, urethra, and Skene. The cervix is visualized, it is 4 cm without any gross lesions. There is no parametrial nodularity. No firmness. No stranding. Uterus is mobile. No pelvic masses appreciated. No rectovaginal septum nodularity.  IMPRESSION: Stage II vulvar cancer. No evidence of disease, presumed cured  Stage IV cervical cancer.  The patient received a total of 39 cGy, completed on June 04, 2010 with concurrent cisplatin, with no evidence of disease on physical  examination.  Followup in 3 months

## 2012-12-19 ENCOUNTER — Encounter (HOSPITAL_COMMUNITY): Payer: Self-pay | Admitting: *Deleted

## 2012-12-19 ENCOUNTER — Emergency Department (HOSPITAL_COMMUNITY)
Admission: EM | Admit: 2012-12-19 | Discharge: 2012-12-20 | Disposition: A | Discharge status: Home / Self Care | Payer: Managed Care, Other (non HMO) | Attending: Emergency Medicine | Admitting: Emergency Medicine

## 2012-12-19 ENCOUNTER — Emergency Department (HOSPITAL_COMMUNITY): Payer: Managed Care, Other (non HMO)

## 2012-12-19 DIAGNOSIS — Z794 Long term (current) use of insulin: Secondary | ICD-10-CM | POA: Insufficient documentation

## 2012-12-19 DIAGNOSIS — E1169 Type 2 diabetes mellitus with other specified complication: Secondary | ICD-10-CM | POA: Insufficient documentation

## 2012-12-19 DIAGNOSIS — M79609 Pain in unspecified limb: Secondary | ICD-10-CM | POA: Insufficient documentation

## 2012-12-19 DIAGNOSIS — S98139A Complete traumatic amputation of one unspecified lesser toe, initial encounter: Secondary | ICD-10-CM | POA: Insufficient documentation

## 2012-12-19 DIAGNOSIS — L988 Other specified disorders of the skin and subcutaneous tissue: Secondary | ICD-10-CM | POA: Insufficient documentation

## 2012-12-19 DIAGNOSIS — R739 Hyperglycemia, unspecified: Secondary | ICD-10-CM

## 2012-12-19 DIAGNOSIS — M79673 Pain in unspecified foot: Secondary | ICD-10-CM

## 2012-12-19 LAB — GLUCOSE, CAPILLARY: Glucose-Capillary: 348 mg/dL — ABNORMAL HIGH (ref 70–99)

## 2012-12-19 MED ORDER — INSULIN ASPART 100 UNIT/ML ~~LOC~~ SOLN: 8.0000 [IU] | Freq: Once | SUBCUTANEOUS | Status: DC

## 2012-12-19 MED ORDER — INSULIN ASPART 100 UNIT/ML ~~LOC~~ SOLN
8.0000 [IU] | Freq: Once | SUBCUTANEOUS | Status: AC
  Administered 2012-12-19: 8 [IU] via SUBCUTANEOUS
  Filled 2012-12-19: qty 8

## 2012-12-19 NOTE — ED Notes (Signed)
C/o lt lower leg pain for one week.

## 2012-12-19 NOTE — ED Notes (Signed)
PT has brown escar the [place] area of LT foot. Pt reports the escar has been there for 3 years and was last treated in 2013.

## 2012-12-19 NOTE — ED Notes (Signed)
Family member came into hall to report the PT is now ready to talk to staff.

## 2012-12-19 NOTE — ED Provider Notes (Signed)
History     CSN: [id number]  Arrival date & time 12/19/12  2040   First MD Initiated Contact with Patient 12/19/12 2210      Chief Complaint  Patient presents with  . Leg Pain   HPI  History provided by the patient. Patient is a 47 year old Hispanic female with history of insulin-dependent diabetes who presents with complaints of persistent and increasing left great toe and foot pains. Symptoms began 2 weeks ago and have been persistently worsening. Pain is worse with walking and pressure. Patient also reports some redness to the skin.she has history of prior left second toe amputation. She denies any other aggravating or alleviating factors. Denies any other associated symptoms. Denies any fever, chills or sweats. Denies any erythematous streaks up the leg. denies any injury or trauma     Past Medical History  Diagnosis Date  . Diabetes mellitus     History reviewed. No pertinent past surgical history.  No family history on file.  History  Substance Use Topics  . Smoking status: Not on file  . Smokeless tobacco: Not on file  . Alcohol Use: No    OB History    Grav Para Term Preterm Abortions TAB SAB Ect Mult Living                  Review of Systems  Constitutional: Negative for fever and chills.  Gastrointestinal: Negative for nausea and vomiting.  All other systems reviewed and are negative.    Allergies  Review of patient's allergies indicates no known allergies.  Home Medications   Current Outpatient Rx  Name  Route  Sig  Dispense  Refill  . INSULIN ASPART 100 UNIT/ML [place] SOLN   Subcutaneous   Inject 3 Units into the skin 3 (three) times daily before meals.         . INSULIN GLARGINE 100 UNIT/ML [place] SOLN   Subcutaneous   Inject 20 Units into the skin at bedtime.            BP 119/67  Pulse 98  Temp 98.4 F (36.9 C) (Oral)  Resp 16  SpO2 99%  Physical Exam  Nursing note and vitals reviewed. Constitutional: She is oriented to person,  place, and time. She appears well-developed and well-nourished. No distress.  HENT:  Head: Normocephalic.  Cardiovascular: Normal rate and regular rhythm.   No murmur heard. Pulmonary/Chest: Effort normal and breath sounds normal. No respiratory distress. She has no wheezes. She has no rales.  Musculoskeletal:       Surgical removal of left second toe. There is old and chronic appearing callus to the pad of the foot at base of third and fourth toes. There is a small callus to the lateral and plantar surface of the great toe. Mild erythema around the medial aspect of the left great toe with tenderness. No diffuse swelling. Patient reports unchanged decreased sensation to all toes at baseline. She does have some sensation to touch. Normal dorsal pedal pulses. No erythematous streaks.  Neurological: She is alert and oriented to person, place, and time.  Skin: Skin is warm and dry. No rash noted.  Psychiatric: She has a normal mood and affect. Her behavior is normal.    ED Course  Procedures   Results for orders placed during the hospital encounter of 12/19/12  GLUCOSE, CAPILLARY      Component Value Range   Glucose-Capillary 348 (*) 70 - 99 mg/dL   Comment 1 Documented in Chart  GLUCOSE, CAPILLARY      Component Value Range   Glucose-Capillary 333 (*) 70 - 99 mg/dL  POCT I-STAT, CHEM 8      Component Value Range   Sodium 137  135 - 145 mEq/L   Potassium 4.0  3.5 - 5.1 mEq/L   Chloride 103  96 - 112 mEq/L   BUN 27 (*) 6 - 23 mg/dL   Creatinine, Ser 1.61  0.50 - 1.10 mg/dL   Glucose, Bld 096 (*) 70 - 99 mg/dL   Calcium, Ion 0.45  4.09 - 1.23 mmol/L   TCO2 27  0 - 100 mmol/L   Hemoglobin 12.6  12.0 - 15.0 g/dL   HCT 81.1  91.4 - 78.2 %      Dg Foot Complete Left  12/19/2012  *RADIOLOGY REPORT*  Clinical Data: Left foot pain.  Multiple open sores.  LEFT FOOT - COMPLETE 3+ VIEW  Comparison: 10/26/2011  Findings: Previous resection of the distal second metatarsal bone. The alignment  of the left foot is unchanged.  No evidence for acute fracture or dislocation.  No evidence for bone destruction or periosteal reaction.  IMPRESSION: Stable appearance of the left foot.  No acute bony abnormality.   Original Report Authenticated By: Richarda Overlie, M.D.      1. Foot pain   2. Hyperglycemia       MDM  10:05PM patient seen and evaluated.patient appears well in no acute distress.  Patient currently states she does not have a PCP. She does report having plenty of insulin at home. Patient also just recently underwent a eye procedure with an ophthalmologist. She will return next Monday.  Patient with elevated blood sugar. We'll check i-STAT 8 to be sure no signs of anion gap for DKA. Clinically the patient appears well.  I-STAT unremarkable. At this time we'll give prescription for antibiotics to cover possible infection and medicine for pain. Patient instructed to followup with PCP for continued evaluation and treatment.    Angus Seller, [place] 12/20/12 [phone]

## 2012-12-19 NOTE — ED Notes (Signed)
Pt on phone but will not hang up for assessment.

## 2012-12-20 LAB — POCT I-STAT, CHEM 8
BUN: 27 mg/dL — ABNORMAL HIGH (ref 6–23)
Calcium, Ion: 1.15 mmol/L (ref 1.12–1.23)
Creatinine, Ser: 0.7 mg/dL (ref 0.50–1.10)
Hemoglobin: 12.6 g/dL (ref 12.0–15.0)
Sodium: 137 mEq/L (ref 135–145)
TCO2: 27 mmol/L (ref 0–100)

## 2012-12-20 LAB — GLUCOSE, CAPILLARY: Glucose-Capillary: 333 mg/dL — ABNORMAL HIGH (ref 70–99)

## 2012-12-20 MED ORDER — CLINDAMYCIN HCL 300 MG PO CAPS: 300.0000 mg | ORAL_CAPSULE | Freq: Four times a day (QID) | ORAL | Status: DC

## 2012-12-20 MED ORDER — HYDROCODONE-ACETAMINOPHEN 5-325 MG PO TABS: 1.0000 | ORAL_TABLET | ORAL | Status: DC | PRN

## 2012-12-26 NOTE — ED Provider Notes (Signed)
Medical screening examination/treatment/procedure(s) were performed by non-physician practitioner and as supervising physician I was immediately available for consultation/collaboration.  Daine Gunther M Jarel Cuadra, MD 12/26/12 0124 

## 2013-01-19 ENCOUNTER — Ambulatory Visit: Payer: 59 | Attending: Gynecologic Oncology | Admitting: Gynecologic Oncology

## 2013-01-19 ENCOUNTER — Encounter: Payer: Self-pay | Admitting: Gynecologic Oncology

## 2013-01-19 VITALS — BP 100/60 | HR 62 | Temp 98.5°F | Resp 16 | Ht 60.71 in | Wt 139.0 lb

## 2013-01-19 DIAGNOSIS — E119 Type 2 diabetes mellitus without complications: Secondary | ICD-10-CM | POA: Insufficient documentation

## 2013-01-19 DIAGNOSIS — C539 Malignant neoplasm of cervix uteri, unspecified: Secondary | ICD-10-CM | POA: Insufficient documentation

## 2013-01-19 DIAGNOSIS — C519 Malignant neoplasm of vulva, unspecified: Secondary | ICD-10-CM | POA: Insufficient documentation

## 2013-01-19 DIAGNOSIS — Z794 Long term (current) use of insulin: Secondary | ICD-10-CM | POA: Insufficient documentation

## 2013-01-19 NOTE — Patient Instructions (Addendum)
Stage IV cervical cancer.   No evidence of disease  Followup in 3 months

## 2013-01-19 NOTE — Progress Notes (Signed)
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   HISTORY OF PRESENT ILLNESS: This is a 47 year old who underwent a radical wide local excision of the right vulva and right inguinal lymph  node dissection for stage II vulvar cancer in 2005. She has been without evidence of disease of that cancer since. She has, however, lost to follow up and presented in the hospital in March 2011, with inability to void, vaginal bleeding, and weight loss. Physical examination demonstrated presence of 11.5 x 5.8 x 10.5 mass involving the vagina extending into the anterior bladder. A bladder biopsy was positive for infiltrative squamous cell carcinoma, presumptive etiology of the cervix. She received chemoradiation and only received 30 Gy in 12 fractions with concurrent cisplatin therapy with a complete response. The note from Dr. Clelia Croft dated October 03, 2010, is appreciated. It is also evident that she saw a PET scan was obtained on February 07, 2011, which demonstrated a slightly enlarged left inguinal lymph node with FGD uptake, SUVmax was 2.6.   See if this lymph node in June of 2012 was within normal limits.  A pap test collected by her primary care physician on 10/07/2012 was within normal limits   PAST MEDICAL HISTORY:  1. Stage II vulvar cancer, diagnosed in 2005.  2. Stage IV cervical cancer, diagnosed in 2011.  3. Insulin-dependent diabetes mellitus.   PAST SURGICAL HISTORY: No interval changes.  REVIEW OF SYSTEMS: No headache cough shortness of breath chest pain. No nausea, vomiting, fever, chills, reports a good appetite with weight gain, no constipation or diarrhea. No vaginal bleeding or discharge no adenopathy feels very well otherwise 10 point review of systems is noncontributory.  SOCIAL HISTORY: Jehovah's Witness  PHYSICAL EXAMINATION: GENERAL: Well-developed female, in no acute distress.  VITAL SIGNS: BP 100/60  Pulse 62  Temp(Src) 98.5  F (36.9 C)  Resp 16  Ht 5' 0.71" (1.542 m)  Wt 139 lb (63.05 kg)  BMI 26.52 kg/m2 CHEST: Clear to auscultation.  CARDIAC:  RRR Nl S1S2 LYMPH NODE SURVEY: No cervical, supraclavicular, or inguinal adenopathy.  ABDOMEN: Soft, nontender, obese no fluid wave or palpable masses PELVIC: Normal external genitalia, Bartholin, urethra, and Skene. Vitiligo of the external genitalia is appreciated. The cervix is visualized, it is 4 cm without any gross lesions. There is no parametrial nodularity. No firmness. Uterus is mobile. No pelvic masses appreciated. No rectovaginal septum nodularity.  BACK:  No CVAT EXT:  Healing diabetic ulcers of on the feet. IMPRESSION: Stage II vulvar cancer. No evidence of disease, presumed cured  Stage IV cervical cancer.  The patient received a total of 39 cGy, completed on June 04, 2010 with concurrent cisplatin, with no evidence of disease on physical  examination.  Followup in 3 months

## 2013-03-03 ENCOUNTER — Encounter (HOSPITAL_BASED_OUTPATIENT_CLINIC_OR_DEPARTMENT_OTHER): Payer: Managed Care, Other (non HMO) | Attending: General Surgery

## 2013-03-03 DIAGNOSIS — S98139A Complete traumatic amputation of one unspecified lesser toe, initial encounter: Secondary | ICD-10-CM | POA: Insufficient documentation

## 2013-03-03 DIAGNOSIS — Z794 Long term (current) use of insulin: Secondary | ICD-10-CM | POA: Insufficient documentation

## 2013-03-03 DIAGNOSIS — L97509 Non-pressure chronic ulcer of other part of unspecified foot with unspecified severity: Secondary | ICD-10-CM | POA: Insufficient documentation

## 2013-03-03 DIAGNOSIS — Z8544 Personal history of malignant neoplasm of other female genital organs: Secondary | ICD-10-CM | POA: Insufficient documentation

## 2013-03-03 DIAGNOSIS — Z8541 Personal history of malignant neoplasm of cervix uteri: Secondary | ICD-10-CM | POA: Insufficient documentation

## 2013-03-03 DIAGNOSIS — L97309 Non-pressure chronic ulcer of unspecified ankle with unspecified severity: Secondary | ICD-10-CM | POA: Insufficient documentation

## 2013-03-03 DIAGNOSIS — E1069 Type 1 diabetes mellitus with other specified complication: Secondary | ICD-10-CM | POA: Insufficient documentation

## 2013-03-03 DIAGNOSIS — L84 Corns and callosities: Secondary | ICD-10-CM | POA: Insufficient documentation

## 2013-03-08 LAB — GLUCOSE, CAPILLARY

## 2013-03-09 NOTE — Progress Notes (Signed)
[facility]  NAME:  [name], [name] NO.:  [id number]  MEDICAL RECORD NO.:  [id number]      DATE OF BIRTH:  [birth year]  PHYSICIAN:  Ardath Sax, M.D.           VISIT DATE:                                  OFFICE VISIT   This is a Spanish-speaking 47 year old lady who is a diabetic, who comes here with a diabetic foot ulcer on her left foot on the plantar aspect, and also the lateral aspect of the ankle.  We are classifying them as [address] diabetic foot ulcers.  I debrided the callus from all of these ulcers and put on silver alginate dressings.  When she came here, her vital signs were the following.  Blood pressure 139/85, pulse 100, temperature 98.  She weighs 130 pounds.  Her blood sugar was 248.  She is a type 1 diabetic and takes Lantus insulin and NovoLog insulin, and she is also on ferrous sulfate for anemia.  Her history is definitely a significant besides being diabetic and having bilateral foot ulcers. She has had her left second toe removed for diabetic problems.  She has also had stage II vulvar carcinoma of the cervix and also stage IV cervical cancer and stage II vulvar cancer.  After we debrided these, we treated them with silver alginate dressings.  She will come back here in a week.     Ardath Sax, M.D.     PP/MEDQ  D:  03/08/2013  T:  03/09/2013  Job:  [id number]

## 2013-05-12 ENCOUNTER — Emergency Department (HOSPITAL_COMMUNITY)
Admission: EM | Admit: 2013-05-12 | Discharge: 2013-05-12 | Disposition: A | Discharge status: Home / Self Care | Payer: Managed Care, Other (non HMO) | Attending: Emergency Medicine | Admitting: Emergency Medicine

## 2013-05-12 ENCOUNTER — Emergency Department (HOSPITAL_COMMUNITY): Payer: Managed Care, Other (non HMO)

## 2013-05-12 DIAGNOSIS — L97509 Non-pressure chronic ulcer of other part of unspecified foot with unspecified severity: Secondary | ICD-10-CM | POA: Insufficient documentation

## 2013-05-12 DIAGNOSIS — Z794 Long term (current) use of insulin: Secondary | ICD-10-CM | POA: Insufficient documentation

## 2013-05-12 DIAGNOSIS — Z8679 Personal history of other diseases of the circulatory system: Secondary | ICD-10-CM | POA: Insufficient documentation

## 2013-05-12 DIAGNOSIS — IMO0002 Reserved for concepts with insufficient information to code with codable children: Secondary | ICD-10-CM | POA: Insufficient documentation

## 2013-05-12 DIAGNOSIS — E11621 Type 2 diabetes mellitus with foot ulcer: Secondary | ICD-10-CM

## 2013-05-12 DIAGNOSIS — Z862 Personal history of diseases of the blood and blood-forming organs and certain disorders involving the immune mechanism: Secondary | ICD-10-CM | POA: Insufficient documentation

## 2013-05-12 DIAGNOSIS — Z8669 Personal history of other diseases of the nervous system and sense organs: Secondary | ICD-10-CM | POA: Insufficient documentation

## 2013-05-12 DIAGNOSIS — E1165 Type 2 diabetes mellitus with hyperglycemia: Secondary | ICD-10-CM | POA: Insufficient documentation

## 2013-05-12 DIAGNOSIS — Z8544 Personal history of malignant neoplasm of other female genital organs: Secondary | ICD-10-CM | POA: Insufficient documentation

## 2013-05-12 LAB — BASIC METABOLIC PANEL
BUN: 16 mg/dL (ref 6–23)
Chloride: 98 mEq/L (ref 96–112)
GFR calc non Af Amer: >90 mL/min (ref >90)
Glucose, Bld: 341 mg/dL — ABNORMAL HIGH (ref 70–99)
Potassium: 4.9 mEq/L (ref 3.5–5.1)
Sodium: 135 mEq/L (ref 135–145)

## 2013-05-12 LAB — CBC WITH DIFFERENTIAL/PLATELET
Eosinophils Absolute: 0.4 10*3/uL (ref 0.0–0.7)
Hemoglobin: 10.5 g/dL — ABNORMAL LOW (ref 12.0–15.0)
Lymphocytes Relative: 23 % (ref 12–46)
Lymphs Abs: 1.8 10*3/uL (ref 0.7–4.0)
MCH: 29.2 pg (ref 26.0–34.0)
Monocytes Relative: 9 % (ref 3–12)
Neutro Abs: 4.9 10*3/uL (ref 1.7–7.7)
Neutrophils Relative %: 63 % (ref 43–77)
Platelets: 547 10*3/uL — ABNORMAL HIGH (ref 150–400)
RBC: 3.59 MIL/uL — ABNORMAL LOW (ref 3.87–5.11)
WBC: 7.8 10*3/uL (ref 4.0–10.5)

## 2013-05-12 LAB — CG4 I-STAT (LACTIC ACID): Lactic Acid, Venous: 0.59 mmol/L (ref 0.5–2.2)

## 2013-05-12 MED ORDER — CEPHALEXIN 500 MG PO CAPS: 500.0000 mg | ORAL_CAPSULE | Freq: Four times a day (QID) | ORAL | Status: DC

## 2013-05-12 MED ORDER — SULFAMETHOXAZOLE-TRIMETHOPRIM 800-160 MG PO TABS: 1.0000 | ORAL_TABLET | Freq: Two times a day (BID) | ORAL | Status: DC

## 2013-05-12 NOTE — ED Provider Notes (Signed)
History    This chart was scribed for [name], non-physician practitioner working with Vida Roller, MD by Leone Payor, ED Scribe. This patient was seen in room WTR5/WTR5 and the patient's care was started at 1728.   CSN: [id number]  Arrival date & time 05/12/13  1728   First MD Initiated Contact with Patient 05/12/13 1803      No chief complaint on file.   The history is provided by the patient. No language interpreter was used.    HPI Comments: [name] is a 47 y.o. female who presents to the Emergency Department complaining of ongoing, constant pain to L foot starting 2 weeks ago. Pt is a diabetic and states she "sometimes" checks her blood glucose levels. She rates her pain as a 5-6/10 and describes the pain as burning. Reports having an operation to the same foot 2 years ago. Pt has a sore to the bottom of L foot that she reports has occasional drainage. She has a h/o diabetic foot ulcers and uncontrolled DM type 2. She denies fever, HA, dizziness.    Past Medical History  Diagnosis Date  . 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 Laurette Schimke), Cysto performed by Dr. Katherine Roan 12/2009 with evac of clots and bx and fulguration. // H/O stage 2 SCC of the vulva  . Anemia     2/2 blood loss  . Diabetes mellitus type 2, uncontrolled DX: 2001  . 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 Diabetic foot ulcers   . Cataract     Past Surgical History  Procedure Laterality Date  . Radical wide local excision of the vulva and right nguinal lymph node dissection  10/2003    Dr. Stanford Breed  . Uterine dilatation and currettage    . Labial mass excision  07/2003    Dr. Elana Alm  . Left second toe mtp joint amputation  12/19/2010    Dr. Turner Daniels  .  Irrigation and debridement of left foot with removal of left  01/29/2011    Dr. Turner Daniels  . Tubal ligation      Family History  Problem Relation Age of Onset  . Diabetes Mother   . Diabetes Father   . Diabetes Brother     History  Substance Use Topics  . Smoking status: Never Smoker   . Smokeless tobacco: Never Used  . Alcohol Use: No    OB History   Grav Para Term Preterm Abortions TAB SAB Ect Mult Living                  Review of Systems  Constitutional: Negative for fever.  HENT: Negative for neck pain and neck stiffness.   Respiratory: Negative for chest tightness and shortness of breath.   Cardiovascular: Negative for chest pain.  Musculoskeletal: Negative for arthralgias.  Skin: Positive for wound. Negative for rash.  Neurological: Negative for dizziness and headaches.  All other systems reviewed and are negative.    Allergies  Review of patient's allergies indicates no known allergies.  Home Medications   Current Outpatient Rx  Name  Route  Sig  Dispense  Refill  . insulin aspart (NOVOLOG) 100 UNIT/ML injection   Subcutaneous   Inject 10 Units into the skin 3 (three) times  daily before meals.   1 vial   11   . insulin glargine (LANTUS) 100 UNIT/ML injection   Subcutaneous   Inject 20 Units into the skin at bedtime.   10 mL   11   . cephALEXin (KEFLEX) 500 MG capsule   Oral   Take 1 capsule (500 mg total) by mouth 4 (four) times daily.   20 capsule   0   . sulfamethoxazole-trimethoprim (BACTRIM DS,SEPTRA DS) 800-160 MG per tablet   Oral   Take 1 tablet by mouth 2 (two) times daily. One po bid x 7 days   14 tablet   0     BP 97/59  Pulse 85  Temp(Src) 98.2 F (36.8 C) (Oral)  Resp 16  SpO2 96%  LMP 05/12/2013  Physical Exam  Nursing note and vitals reviewed. Constitutional: She is oriented to person, place, and time. She appears well-developed and well-nourished. No distress.  HENT:  Head: Normocephalic and atraumatic.  Eyes:  Conjunctivae and EOM are normal. Pupils are equal, round, and reactive to light.  Neck: Normal range of motion. Neck supple.  Cardiovascular: Normal rate, regular rhythm and normal heart sounds.  Exam reveals no friction rub.   No murmur heard. Pulses:      Radial pulses are 2+ on the right side, and 2+ on the left side.       Dorsalis pedis pulses are 2+ on the right side, and 2+ on the left side.  Pulmonary/Chest: Effort normal and breath sounds normal. No respiratory distress. She has no wheezes. She has no rales.  Musculoskeletal: Normal range of motion.  Lymphadenopathy:    She has no cervical adenopathy.  Neurological: She is alert and oriented to person, place, and time.  Skin: Skin is warm and dry.  Sole of L foot, at base of big toe there is diabetic foot ulcer noted with deep hole to the center of the ulcer. Positive weeping of clear fluid. Pain upon palpation to site. Negative crepitus. Positive swelling and erythema noted to dorsum of foot.   Amputation of second toe on left foot.   Psychiatric: She has a normal mood and affect. Her behavior is normal.    ED Course  Procedures (including critical care time)  DIAGNOSTIC STUDIES: Oxygen Saturation is 99% on RA, normal by my interpretation.    COORDINATION OF CARE: 6:44 PM Discussed treatment plan with pt at bedside and pt agreed to plan.   Labs Reviewed  CBC WITH DIFFERENTIAL - Abnormal; Notable for the following:    RBC 3.59 (*)    Hemoglobin 10.5 (*)    HCT 30.8 (*)    Platelets 547 (*)    All other components within normal limits  BASIC METABOLIC PANEL - Abnormal; Notable for the following:    Glucose, Bld 341 (*)    All other components within normal limits  WOUND CULTURE  CG4 I-STAT (LACTIC ACID)   Dg Foot Complete Left  05/12/2013   *RADIOLOGY REPORT*  Clinical Data: Diabetes, ulcer  LEFT FOOT - COMPLETE 3+ VIEW  Comparison: None.  Findings: Three views of the left foot submitted.  The patient is status post  amputation of the distal second metatarsal and second toe.  No acute fracture or subluxation.  There is soft tissue swelling metatarsal region.  There is soft tissue defect probable ulcer in the anterior plantar region.  No periosteal reaction or bony erosion.  IMPRESSION:  The patient is status post amputation of the distal second metatarsal  and second toe.  No acute fracture or subluxation. There is soft tissue swelling metatarsal region.  There is soft tissue defect probable ulcer in the anterior plantar region.  No periosteal reaction or bony erosion.   Original Report Authenticated By: Natasha Mead, M.D.     1. Diabetic foot ulcer       MDM  I personally performed the services described in this documentation, which was scribed in my presence. The recorded information has been reviewed and is accurate.  Negative elevated lactic acid, negative elevated WBC. CBC and BMP negative findings. Imaging negative for findings of osteomyelitis. Patient's blood pressure monitored in ED setting. Discussed case with Dr. Fleet Contras - Dr. Fleet Contras saw and assessed patient. Patient stable, afebrile. Patient to be discharged. Discharged patient with bactrim and keflex for coverage of infection. Site cleaned and gauze applied. Referred patient to podiatry. Discussed with patient to continue to monitor blood glucose levels. Discussed with patient to follow-up with PCP. Discussed with patient to keep site clean and covered at all times. Discussed with patient to monitor symptoms and if symptoms are to worsen or change to report back to the ED - strict return instructions given. Patient agreed to plan of care, understood, all questions answered.   Hailey Mutton, PA-C 05/13/13 [phone]

## 2013-05-12 NOTE — ED Provider Notes (Signed)
47 year old female with a history of a left lower extremity foot infection, she has had a prior amputation of her second toe, progressive swelling of the foot with a clear drainage from an open draining wound on the bottom of the foot. She denies fevers chills nausea vomiting and has minimal pain in the foot. She is concerned because of the drainage and a slight increase in swelling.  On exam she has clear heart and lung sounds, she appears in no acute distress, she has no peripheral swelling or deformities other than her left foot. Notable is the absence of the second toe, mild swelling of the foot distal to the mid foot, no redness, no tenderness, a clear effluent solution is expressed from the central plantar wound. No purulence, no foul smell, no fever.  Doubt significant infection, rule out osteomyelitis with x-ray, CBC, anticipate discharge on antibiotic with podiatry followup. Blood pressure will need to be rechecked to ensure no hypotension   Medical screening examination/treatment/procedure(s) were conducted as a shared visit with non-physician practitioner(s) and myself.  I personally evaluated the patient during the encounter    Vida Roller, MD 05/12/13 [phone]

## 2013-05-13 NOTE — ED Provider Notes (Signed)
Medical screening examination/treatment/procedure(s) were conducted as a shared visit with non-physician practitioner(s) and myself.  I personally evaluated the patient during the encounter  Please see my separate respective documentation pertaining to this patient encounter   Vida Roller, MD 05/13/13 [phone]

## 2013-05-16 LAB — WOUND CULTURE: Special Requests: NORMAL

## 2013-05-22 ENCOUNTER — Encounter (HOSPITAL_COMMUNITY): Payer: Self-pay | Admitting: Nurse Practitioner

## 2013-05-22 ENCOUNTER — Emergency Department (HOSPITAL_COMMUNITY): Payer: Managed Care, Other (non HMO)

## 2013-05-22 ENCOUNTER — Emergency Department (HOSPITAL_COMMUNITY)
Admission: EM | Admit: 2013-05-22 | Discharge: 2013-05-22 | Disposition: A | Discharge status: Home / Self Care | Payer: Managed Care, Other (non HMO) | Attending: Emergency Medicine | Admitting: Emergency Medicine

## 2013-05-22 DIAGNOSIS — L97509 Non-pressure chronic ulcer of other part of unspecified foot with unspecified severity: Secondary | ICD-10-CM | POA: Insufficient documentation

## 2013-05-22 DIAGNOSIS — L97529 Non-pressure chronic ulcer of other part of left foot with unspecified severity: Secondary | ICD-10-CM

## 2013-05-22 DIAGNOSIS — Z794 Long term (current) use of insulin: Secondary | ICD-10-CM | POA: Insufficient documentation

## 2013-05-22 DIAGNOSIS — E1169 Type 2 diabetes mellitus with other specified complication: Secondary | ICD-10-CM | POA: Insufficient documentation

## 2013-05-22 DIAGNOSIS — Z8669 Personal history of other diseases of the nervous system and sense organs: Secondary | ICD-10-CM | POA: Insufficient documentation

## 2013-05-22 DIAGNOSIS — Z862 Personal history of diseases of the blood and blood-forming organs and certain disorders involving the immune mechanism: Secondary | ICD-10-CM | POA: Insufficient documentation

## 2013-05-22 DIAGNOSIS — Z8544 Personal history of malignant neoplasm of other female genital organs: Secondary | ICD-10-CM | POA: Insufficient documentation

## 2013-05-22 DIAGNOSIS — Z8739 Personal history of other diseases of the musculoskeletal system and connective tissue: Secondary | ICD-10-CM | POA: Insufficient documentation

## 2013-05-22 LAB — COMPREHENSIVE METABOLIC PANEL
AST: 12 U/L (ref 0–37)
CO2: 31 mEq/L (ref 19–32)
Calcium: 9 mg/dL (ref 8.4–10.5)
Creatinine, Ser: 0.76 mg/dL (ref 0.50–1.10)
GFR calc Af Amer: >90 mL/min (ref >90)
GFR calc non Af Amer: >90 mL/min (ref >90)
Glucose, Bld: 282 mg/dL — ABNORMAL HIGH (ref 70–99)
Total Protein: 7.8 g/dL (ref 6.0–8.3)

## 2013-05-22 LAB — CBC
HCT: 31.8 % — ABNORMAL LOW (ref 36.0–46.0)
Hemoglobin: 11 g/dL — ABNORMAL LOW (ref 12.0–15.0)
RBC: 3.73 MIL/uL — ABNORMAL LOW (ref 3.87–5.11)

## 2013-05-22 LAB — GLUCOSE, CAPILLARY: Glucose-Capillary: 284 mg/dL — ABNORMAL HIGH (ref 70–99)

## 2013-05-22 MED ORDER — CLINDAMYCIN HCL 150 MG PO CAPS: 150.0000 mg | ORAL_CAPSULE | Freq: Four times a day (QID) | ORAL | Status: DC

## 2013-05-22 NOTE — ED Notes (Signed)
Wounds are appropriately described in assessments. Pt reports she has noticed a decrease in swelling with minimal drainage.

## 2013-05-22 NOTE — ED Notes (Signed)
Interpretor phone used: Pt reports finishing abx treatment and states swelling has decreased, but continues to have 7/10 pain to open wound on bottom of left foot and on dorsal aspect. Denies taking anything for pain today. Pt reports checking BG every 3 days. Took 10 units of insulin today at 0600 and 1300. BG 284 at this time. Pt normally runs in 230-260's. Denies fever/chills.

## 2013-05-22 NOTE — ED Notes (Signed)
MD Wentz at bedside.  

## 2013-05-22 NOTE — ED Provider Notes (Signed)
History    CSN: [id number] Arrival date & time 05/22/13  1629  First MD Initiated Contact with Patient 05/22/13 1916     Chief Complaint  Patient presents with  . Foot Problem   (Consider location/radiation/quality/duration/timing/severity/associated sxs/prior Treatment) HPI Comments: [name] is a 47 y.o. Female who is here for evaluation of left foot swelling. The left foot swelling, is intermittent, for several weeks. She was in the emergency Department at [facility] long 10 days ago and prescribed Keflex and Septra for her foot. At that point, it was felt that she had a foot infection. The patient states that she is able to walk and has only minimal pain in the left foot. She is wondering why it still swells. In the last year. She has been receiving care for a left foot ulcer. She was last seen at the wound care clinic on 03/12/2013. She last saw her PCP about 2 months ago. She denies fever, chills, nausea, vomiting, weakness, or dizziness. There are no known modifying factors.  The history is provided by the patient. A language interpreter was used Associate Professor).   Past Medical History  Diagnosis Date  . 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 Laurette Schimke), Cysto performed by Dr. Katherine Roan 12/2009 with evac of clots and bx and fulguration. // H/O stage 2 SCC of the vulva  . Anemia     2/2 blood loss  . Diabetes mellitus type 2, uncontrolled DX: 2001  . 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 Diabetic foot ulcers   . Cataract    Past Surgical History  Procedure Laterality Date  . Radical wide local excision of the vulva and right nguinal lymph node dissection  10/2003    Dr. Stanford Breed  . Uterine dilatation and currettage    . Labial mass  excision  07/2003    Dr. Elana Alm  . Left second toe mtp joint amputation  12/19/2010    Dr. Turner Daniels  . Irrigation and debridement of left foot with removal of left  01/29/2011    Dr. Turner Daniels  . Tubal ligation     Family History  Problem Relation Age of Onset  . Diabetes Mother   . Diabetes Father   . Diabetes Brother    History  Substance Use Topics  . Smoking status: Never Smoker   . Smokeless tobacco: Never Used  . Alcohol Use: No   OB History   Grav Para Term Preterm Abortions TAB SAB Ect Mult Living                 Review of Systems  All other systems reviewed and are negative.    Allergies  Review of patient's allergies indicates no known allergies.  Home Medications   Current Outpatient Rx  Name  Route  Sig  Dispense  Refill  . insulin aspart (NOVOLOG) 100 UNIT/ML injection   Subcutaneous   Inject 10 Units into the skin 3 (three) times daily before meals.   1 vial   11   . insulin glargine (LANTUS) 100 UNIT/ML injection   Subcutaneous   Inject 20 Units into the skin at bedtime.   10 mL   11   . clindamycin (CLEOCIN) 150 MG capsule   Oral  Take 1 capsule (150 mg total) by mouth every 6 (six) hours.   28 capsule   0    BP 115/73  Pulse 82  Temp(Src) 99.1 F (37.3 C) (Oral)  Resp 16  SpO2 98%  LMP 05/12/2013 Physical Exam  Nursing note and vitals reviewed. Constitutional: She is oriented to person, place, and time. She appears well-developed and well-nourished.  HENT:  Head: Normocephalic and atraumatic.  Eyes: Conjunctivae and EOM are normal. Pupils are equal, round, and reactive to light.  Neck: Normal range of motion and phonation normal. Neck supple.  Cardiovascular: Normal rate, regular rhythm and intact distal pulses.   Pulmonary/Chest: Effort normal and breath sounds normal. She exhibits no tenderness.  Abdominal: Soft. She exhibits no distension. There is no tenderness. There is no guarding.  Musculoskeletal: Normal range of motion.   Left Foot superficial ulcer, left mid plantar, MTP area; without associated redness, fluctuance or drainage. There is a healed wound between the first and third toe, consistent with amputation site. There is an irregular scar formation at this site. There is no associated redness, drainage, or deformity. Left foot is nontender to palpation  Neurological: She is alert and oriented to person, place, and time. She has normal strength. She exhibits normal muscle tone.  Skin: Skin is warm and dry.  Psychiatric: She has a normal mood and affect. Her behavior is normal. Judgment and thought content normal.    ED Course  Procedures (including critical care time) Labs Reviewed  COMPREHENSIVE METABOLIC PANEL - Abnormal; Notable for the following:    Glucose, Bld 282 (*)    Alkaline Phosphatase 119 (*)    Total Bilirubin 0.2 (*)    All other components within normal limits  CBC - Abnormal; Notable for the following:    RBC 3.73 (*)    Hemoglobin 11.0 (*)    HCT 31.8 (*)    Platelets 448 (*)    All other components within normal limits  GLUCOSE, CAPILLARY - Abnormal; Notable for the following:    Glucose-Capillary 284 (*)    All other components within normal limits   Dg Foot Complete Left  05/22/2013   *RADIOLOGY REPORT*  Clinical Data: Left foot pain, open wound over the second toe and third fourth metatarsal regions  LEFT FOOT - COMPLETE 3+ VIEW  Comparison: Left foot films of 05/12/2013  Findings: The left second toe has previously been amputated.  No radiographic evidence of active osteomyelitis is currently seen. Tarsal - metatarsal alignment is normal.  IMPRESSION: No evidence of osteomyelitis.   Original Report Authenticated By: Dwyane Dee, M.D.   1. Foot ulcer, left, with unspecified severity     MDM  Subacute left foot ulcer, with diabetes. No frank infection. Patient's symptoms are waxing and waning. She is stable for discharge with outpatient management and wound care setting. She  has recently seen them and should be able to get him to see them as an out patient. Doubt metabolic instability, serious bacterial infection or impending vascular collapse; the patient is stable for discharge.   Nursing Notes Reviewed/ Care Coordinated, and agree without changes. Applicable Imaging Reviewed.  Interpretation of Laboratory Data incorporated into ED treatment    Plan: Home Medications- Usual; Home Treatments and Observation- rest, heat; return here if the recommended treatment, does not improve the symptoms; Recommended follow up- PCP prn    Flint Melter, MD 05/23/13 [phone]

## 2013-05-22 NOTE — ED Notes (Addendum)
Pt with 2 painful wounds on L foot that will not heal. She is diabetic. No fevers at home. States her blood sugar has been around 200s.

## 2013-06-08 ENCOUNTER — Other Ambulatory Visit: Payer: Self-pay

## 2013-12-25 ENCOUNTER — Encounter (HOSPITAL_COMMUNITY): Payer: Self-pay | Admitting: Emergency Medicine

## 2013-12-25 ENCOUNTER — Emergency Department (HOSPITAL_COMMUNITY)
Admission: EM | Admit: 2013-12-25 | Discharge: 2013-12-25 | Disposition: A | Discharge status: Home / Self Care | Payer: No Typology Code available for payment source | Attending: Emergency Medicine | Admitting: Emergency Medicine

## 2013-12-25 DIAGNOSIS — Z794 Long term (current) use of insulin: Secondary | ICD-10-CM | POA: Insufficient documentation

## 2013-12-25 DIAGNOSIS — Z862 Personal history of diseases of the blood and blood-forming organs and certain disorders involving the immune mechanism: Secondary | ICD-10-CM | POA: Insufficient documentation

## 2013-12-25 DIAGNOSIS — Z8739 Personal history of other diseases of the musculoskeletal system and connective tissue: Secondary | ICD-10-CM | POA: Insufficient documentation

## 2013-12-25 DIAGNOSIS — E1169 Type 2 diabetes mellitus with other specified complication: Principal | ICD-10-CM

## 2013-12-25 DIAGNOSIS — H269 Unspecified cataract: Secondary | ICD-10-CM | POA: Insufficient documentation

## 2013-12-25 DIAGNOSIS — Z8544 Personal history of malignant neoplasm of other female genital organs: Secondary | ICD-10-CM | POA: Insufficient documentation

## 2013-12-25 DIAGNOSIS — L97529 Non-pressure chronic ulcer of other part of left foot with unspecified severity: Secondary | ICD-10-CM

## 2013-12-25 DIAGNOSIS — Z8551 Personal history of malignant neoplasm of bladder: Secondary | ICD-10-CM | POA: Insufficient documentation

## 2013-12-25 DIAGNOSIS — IMO0002 Reserved for concepts with insufficient information to code with codable children: Secondary | ICD-10-CM | POA: Insufficient documentation

## 2013-12-25 DIAGNOSIS — E1165 Type 2 diabetes mellitus with hyperglycemia: Secondary | ICD-10-CM | POA: Insufficient documentation

## 2013-12-25 DIAGNOSIS — E11621 Type 2 diabetes mellitus with foot ulcer: Secondary | ICD-10-CM

## 2013-12-25 DIAGNOSIS — L97409 Non-pressure chronic ulcer of unspecified heel and midfoot with unspecified severity: Secondary | ICD-10-CM | POA: Insufficient documentation

## 2013-12-25 LAB — GLUCOSE, CAPILLARY: Glucose-Capillary: 365 mg/dL — ABNORMAL HIGH (ref 70–99)

## 2013-12-25 LAB — COMPREHENSIVE METABOLIC PANEL
ALT: 13 U/L (ref 0–35)
AST: 13 U/L (ref 0–37)
Albumin: 3.7 g/dL (ref 3.5–5.2)
Alkaline Phosphatase: 157 U/L — ABNORMAL HIGH (ref 39–117)
BUN: 21 mg/dL (ref 6–23)
CALCIUM: 8.7 mg/dL (ref 8.4–10.5)
CO2: 29 mEq/L (ref 19–32)
CREATININE: 0.67 mg/dL (ref 0.50–1.10)
Chloride: 94 mEq/L — ABNORMAL LOW (ref 96–112)
GFR calc Af Amer: >90 mL/min (ref >90)
GFR calc non Af Amer: >90 mL/min (ref >90)
Glucose, Bld: 409 mg/dL — ABNORMAL HIGH (ref 70–99)
Potassium: 4.3 mEq/L (ref 3.7–5.3)
SODIUM: 133 meq/L — AB (ref 137–147)
TOTAL PROTEIN: 7.8 g/dL (ref 6.0–8.3)
Total Bilirubin: 0.2 mg/dL — ABNORMAL LOW (ref 0.3–1.2)

## 2013-12-25 LAB — CBC
HCT: 34 % — ABNORMAL LOW (ref 36.0–46.0)
Hemoglobin: 11.6 g/dL — ABNORMAL LOW (ref 12.0–15.0)
MCH: 30.1 pg (ref 26.0–34.0)
MCHC: 34.1 g/dL (ref 30.0–36.0)
MCV: 88.1 fL (ref 78.0–100.0)
PLATELETS: 341 10*3/uL (ref 150–400)
RBC: 3.86 MIL/uL — ABNORMAL LOW (ref 3.87–5.11)
RDW: 12.5 % (ref 11.5–15.5)
WBC: 5.5 10*3/uL (ref 4.0–10.5)

## 2013-12-25 MED ORDER — SULFAMETHOXAZOLE-TRIMETHOPRIM 800-160 MG PO TABS: 1.0000 | ORAL_TABLET | Freq: Two times a day (BID) | ORAL | Status: DC

## 2013-12-25 MED ORDER — CEPHALEXIN 500 MG PO CAPS: 500.0000 mg | ORAL_CAPSULE | Freq: Four times a day (QID) | ORAL | Status: DC

## 2013-12-25 NOTE — ED Notes (Addendum)
Pt c/o diabetic ulcer on bottom of L foot x 4 years and L great toe wound x 1 day.  Pain score 7/10.  Pt sts she has seen a MD about ulcer on bottom of foot, but "it never closed, because of diabetes."  Hx of DM.

## 2013-12-25 NOTE — ED Provider Notes (Signed)
CSN: [id number]     Arrival date & time 12/25/13  1806 History   First MD Initiated Contact with Patient 12/25/13 2200     Chief Complaint  Patient presents with  . Diabetic Ulcer   (Consider location/radiation/quality/duration/timing/severity/associated sxs/prior Treatment) Patient is a 48 y.o. female presenting with general illness. The history is provided by the patient.  Illness Location:  L foot ulcer Quality:  Large foot ulcer Severity:  Moderate Onset quality:  Sudden Duration: 4 years. Timing:  Constant Progression:  Waxing and waning Chronicity:  Chronic Context:  DIabetes, controlled by MDs at [facility], currently on insulin. Chronic L foot ulcer Relieved by:  Nothing Worsened by:  Nothing Associated symptoms: no cough, no diarrhea, no fever, no nausea, no rash, no shortness of breath and no vomiting     Past Medical History  Diagnosis Date  . 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 Janie Morning), Cysto performed by Dr. Jonna Munro 12/2009 with evac of clots and bx and fulguration. // H/O stage 2 SCC of the vulva  . Anemia     2/2 blood loss  . Diabetes mellitus type 2, uncontrolled DX: 2001  . 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 Diabetic foot ulcers   . Cataract    Past Surgical History  Procedure Laterality Date  . Radical wide local excision of the vulva and right nguinal lymph node dissection  10/2003    Dr. Fermin Schwab  . Uterine dilatation and currettage    . Labial mass excision  07/2003    Dr. Ree Edman  . Left second toe mtp joint amputation  12/19/2010    Dr. Mayer Camel  . Irrigation and debridement of left foot with removal of left  01/29/2011    Dr. Mayer Camel  . Tubal ligation     Family History  Problem Relation Age of Onset  .  Diabetes Mother   . Diabetes Father   . Diabetes Brother    History  Substance Use Topics  . Smoking status: Never Smoker   . Smokeless tobacco: Never Used  . Alcohol Use: No   OB History   Grav Para Term Preterm Abortions TAB SAB Ect Mult Living                 Review of Systems  Constitutional: Negative for fever.  Respiratory: Negative for cough and shortness of breath.   Gastrointestinal: Negative for nausea, vomiting and diarrhea.  Skin: Negative for rash.  All other systems reviewed and are negative.    Allergies  Review of patient's allergies indicates no known allergies.  Home Medications   Current Outpatient Rx  Name  Route  Sig  Dispense  Refill  . insulin aspart (NOVOLOG) 100 UNIT/ML injection   Subcutaneous   Inject 10 Units into the skin 3 (three) times daily before meals.         . insulin glargine (LANTUS) 100 UNIT/ML injection   Subcutaneous   Inject 20 Units into the skin at bedtime.         Marland Kitchen EXPIRED: insulin aspart (NOVOLOG) 100 UNIT/ML injection   Subcutaneous   Inject 10 Units into the skin 3 (three) times daily before meals.   1 vial   11   . EXPIRED: insulin glargine (  LANTUS) 100 UNIT/ML injection   Subcutaneous   Inject 20 Units into the skin at bedtime.   10 mL   11    BP 103/61  Pulse 91  Temp(Src) 98.3 F (36.8 C) (Oral)  Resp 18  SpO2 99% Physical Exam  Nursing note and vitals reviewed. Constitutional: She is oriented to person, place, and time. She appears well-developed and well-nourished. No distress.  HENT:  Head: Normocephalic and atraumatic.  Eyes: EOM are normal. Pupils are equal, round, and reactive to light.  Neck: Normal range of motion. Neck supple.  Cardiovascular: Normal rate and regular rhythm.  Exam reveals no friction rub.   No murmur heard. Pulmonary/Chest: Effort normal and breath sounds normal. No respiratory distress. She has no wheezes. She has no rales.  Abdominal: Soft. She exhibits no  distension. There is no tenderness. There is no rebound.  Musculoskeletal: Normal range of motion. She exhibits no edema.  L foot missing 2nd toe. L great toe without toenail Bottom of foot with large ulcer with foul odor. No purulent discharge, no surrounding cellulitis. Nontender. Diameter of ulcer is roughly 1.5 cm.  Large amount of callus area ulcerated area.  Neurological: She is alert and oriented to person, place, and time.  Skin: She is not diaphoretic.    ED Course  Procedures (including critical care time) Labs Review Labs Reviewed  CBC - Abnormal; Notable for the following:    RBC 3.86 (*)    Hemoglobin 11.6 (*)    HCT 34.0 (*)    All other components within normal limits  COMPREHENSIVE METABOLIC PANEL - Abnormal; Notable for the following:    Sodium 133 (*)    Chloride 94 (*)    Glucose, Bld 409 (*)    Alkaline Phosphatase 157 (*)    Total Bilirubin 0.2 (*)    All other components within normal limits  GLUCOSE, CAPILLARY - Abnormal; Notable for the following:    Glucose-Capillary 365 (*)    All other components within normal limits  URINALYSIS, ROUTINE W REFLEX MICROSCOPIC   Imaging Review No results found.  EKG Interpretation   None       MDM   1. Diabetic ulcer of left foot    48 year old female presents with large ulcer on her lower left foot. Patient has 1.5 cm diameter ulcer with lots of dead skin buildup around it, almost appearing like a plantars wart. There's is running cellulitis. There is no pain. Initial purulent discharge. There is a foul odor. She states this is been there for 4 years. She states she's recently on antibiotics for it. She cannot tell me how long odor has been there. Her left great toe also does have the toenail, she said she removed it due to severe fungus because it was not aesthetically pretty. Patient denies any fever, vomiting, or other systemic symptoms. She's followed by local clinic for her diabetes and is on insulin therapy  for that. Patient has no white count, as she is hyperglycemic but has no anion gap. Her sugars appear chronically elevated. Will give her antibiotics for her ulcer as it has a foul odor. I do not think she needs IV antibiotics at this time. I answered her followup with her regular doctor in one to 2 days. She is comfortable with this discharge plan  Osvaldo Shipper, MD 12/25/13 2338

## 2013-12-25 NOTE — Discharge Instructions (Signed)
Diabetes y cuidados del pie (Diabetes and Foot Care) La diabetes puede ser la causa de que el flujo sanguneo (circulacin) en las piernas y los pies sea deficiente. Debido a esto, la piel de los pies se torna ms delgada, se rompe con facilidad y se cura ms lentamente. La piel puede estar seca, despellejarse y Medical illustrator. Tambin pueden estar daados los nervios de las piernas y de los pies lo que provoca una disminucin de la sensibilidad. Es posible que no advierta heridas ms pequeas en los pies, que pueden causar infecciones graves. Cuidar sus pies es una de las cosas ms importantes que puede hacer por usted mismo.  INSTRUCCIONES PARA EL CUIDADO EN EL HOGAR  Use siempre calzado, an dentro de su casa. No camine descalzo. Caminar descalzo facilita que se lastime.  Controle sus pies diariamente para observar ampollas, cortes y enrojecimiento. Si no puede ver la planta del pie, use un espejo o pdale ayuda a Nurse, children's.  Lave sus pies con agua tibia (no use agua caliente) y un [place]. Seque bien sus pies, y la zona [employer] dedos dando [place], hasta que estn completamente secos. Noremoje los pies, ya que esto puede resecar la piel.  Aplique una locin hidratante o vaselina (que no contenga alcohol ni perfume) en los pies y en las uas secas y [place]. No aplique locin entre los dedos.  Recorte las uas en forma recta. No escarbe debajo de las uas o alrededor [employer]. Lime los bordes de las uas con una lima o esmeril.  No corte las durezas o callosidades, ni trate de quitarlas con medicamentos.  Use calcetines de algodn o medias [employer]. Asegrese de que no le [employer]. Nouse calcetines que le lleguen a las rodillas, ya que podran disminuir el flujo de sangre a las piernas.  Use zapatos de cuero que le queden bien y que sean acolchados. Para amoldar los zapatos, clcelos slo algunas horas por da. Esto evitar lesiones en los pies. Revise  siempre los zapatos antes de ponerlos para asegurarse de que no haya objetos en su interior.  No cruce las piernas. Esto puede disminuir el flujo de sangre a los pies.  Si algo le ha raspado, cortado o lastimado la piel de los pies, mantenga la piel de esa zona limpia y [place]. Debe higienizar estas zonas con agua y un jabn suave. No limpie la zona con agua oxigenada, alcohol ni yodo.  Cuando se quite un vendaje adhesivo, asegrese de no daar la piel.  Si tiene una herida, obsrvela varias veces por da para asegurarse de que se est curando.  No use bolsas de agua caliente ni almohadillas trmicas. Podran causar quemaduras. Si ha perdido la sensibilidad en los pies o las piernas, no sabr lo que le est sucediendo hasta que sea demasiado tarde.  Asegrese de que su mdico le haga un examen completo de los pies por lo menos una vez al ao, o con ms frecuencia si usted tiene [employer]. Informe todos los cortes, llagas o moretones a su mdico inmediatamente. SOLICITE ATENCIN MDICA SI:   Tiene una lesin que no se cura.  Tiene cortes o rajaduras en la piel.  Tiene una ua encarnada.  Nota una zona irritada en las piernas o los pies.  Siente una sensacin de ardor u hormigueo en las piernas o los pies.  Siente dolor o calambres en las piernas o los pies.  Las piernas o los pies estn adormecidos.  Siente los pies siempre fros. SOLICITE ATENCIN MDICA DE INMEDIATO SI:   Presenta enrojecimiento, hinchazn o aumento del dolor en una herida.  Nota una lnea roja que sube por pierna.  Aparece pus en la herida.  Le sube la fiebre o segn lo que le indique el mdico.  Advierte un olor ftido que proviene de una lcera o una herida. Document Released: 11/16/2005 Document Revised: 07/19/2013 [facility] Patient Information 2014 [place], [place].

## 2013-12-25 NOTE — ED Notes (Signed)
Pt calledx2, no answer 

## 2014-01-02 ENCOUNTER — Ambulatory Visit (HOSPITAL_COMMUNITY)
Admission: RE | Admit: 2014-01-02 | Discharge: 2014-01-02 | Disposition: A | Discharge status: Home / Self Care | Payer: No Typology Code available for payment source | Source: Ambulatory Visit | Attending: General Surgery | Admitting: General Surgery

## 2014-01-02 ENCOUNTER — Encounter (HOSPITAL_BASED_OUTPATIENT_CLINIC_OR_DEPARTMENT_OTHER): Payer: No Typology Code available for payment source | Attending: General Surgery

## 2014-01-02 ENCOUNTER — Other Ambulatory Visit (HOSPITAL_BASED_OUTPATIENT_CLINIC_OR_DEPARTMENT_OTHER): Payer: Self-pay | Admitting: General Surgery

## 2014-01-02 DIAGNOSIS — Z8541 Personal history of malignant neoplasm of cervix uteri: Secondary | ICD-10-CM | POA: Insufficient documentation

## 2014-01-02 DIAGNOSIS — M869 Osteomyelitis, unspecified: Secondary | ICD-10-CM

## 2014-01-02 DIAGNOSIS — L97509 Non-pressure chronic ulcer of other part of unspecified foot with unspecified severity: Secondary | ICD-10-CM | POA: Insufficient documentation

## 2014-01-02 DIAGNOSIS — E119 Type 2 diabetes mellitus without complications: Secondary | ICD-10-CM | POA: Insufficient documentation

## 2014-01-02 DIAGNOSIS — Z8544 Personal history of malignant neoplasm of other female genital organs: Secondary | ICD-10-CM | POA: Insufficient documentation

## 2014-01-02 DIAGNOSIS — S98139A Complete traumatic amputation of one unspecified lesser toe, initial encounter: Secondary | ICD-10-CM | POA: Insufficient documentation

## 2014-01-02 DIAGNOSIS — Z794 Long term (current) use of insulin: Secondary | ICD-10-CM | POA: Insufficient documentation

## 2014-01-02 DIAGNOSIS — D638 Anemia in other chronic diseases classified elsewhere: Secondary | ICD-10-CM | POA: Insufficient documentation

## 2014-01-02 DIAGNOSIS — E1169 Type 2 diabetes mellitus with other specified complication: Secondary | ICD-10-CM | POA: Insufficient documentation

## 2014-01-02 DIAGNOSIS — Z79899 Other long term (current) drug therapy: Secondary | ICD-10-CM | POA: Insufficient documentation

## 2014-01-02 DIAGNOSIS — I10 Essential (primary) hypertension: Secondary | ICD-10-CM | POA: Insufficient documentation

## 2014-01-02 DIAGNOSIS — E785 Hyperlipidemia, unspecified: Secondary | ICD-10-CM | POA: Insufficient documentation

## 2014-01-02 LAB — GLUCOSE, CAPILLARY: GLUCOSE-CAPILLARY: 120 mg/dL — AB (ref 70–99)

## 2014-01-03 NOTE — H&P (Signed)
[name], [name]                 ACCOUNT NO.:  [id number]  MEDICAL RECORD NO.:  [id number]  LOCATION:  FOOT                         FACILITY:  [facility]  PHYSICIAN:  Elesa Hacker, M.D.        DATE OF BIRTH:  07-Mar-1966  DATE OF ADMISSION:  01/02/2014 DATE OF DISCHARGE:                             HISTORY & PHYSICAL   CHIEF COMPLAINT:  Wound, left foot.  HISTORY OF PRESENT ILLNESS:  This 48 year old, diabetic female, who underwent amputation of the left second toe approximately 4 years ago. She has had a sore on the bottom of her foot, essentially since then she says this has evidently not been treated recently.  PAST MEDICAL HISTORY:  Significant for, 1. Diabetes type 2. 2. Cataracts. 3. Hypertension. 4. Hyperlipidemia. 5. Cancer of the vulva and cancer of the cervix. 6. Anemia of chronic disease.  PAST SURGICAL HISTORY: 1. Radical wide local excision of vulva. 2. Bilateral cataracts surgery. 3. Left second toe amputation.  CIGARETTES:  None.  ALCOHOL:  None.  MEDICATIONS:  Lisinopril, Lantus, and NovoLog.  ALLERGIES:  None.  REVIEW OF SYSTEMS:  As above.  PHYSICAL EXAMINATION:  VITAL SIGNS:  Temperature 98.2, pulse 90, respirations 16, blood pressure 146/82.  Capillary glucose is 120. GENERAL APPEARANCE:  Well developed, well nourished, no distress. CHEST:  Clear. HEART:  Regular rhythm. EXTREMITIES:  Examination of the right lower extremities reveals good peripheral pulses.  ABI is 1.12 on the plantar surface of the foot. There is an 1.3 x 0.8 x 0.5 wound which after debridement of callus on surrounding tissue is 2.0 x 1.0 x 0.5.  IMPRESSION:  Diabetic foot ulcer probably [place] 3.  PLAN OF TREATMENT:  If x-ray reveals no signs of osteomyelitis, we will probably put on easy cast on her next visit.  In the meantime, we will dress with silver collagen.  We will see her in 7 days.     Elesa Hacker, M.D.     RA/MEDQ  D:  01/02/2014  T:  01/03/2014  Job:   [id number]

## 2014-06-27 ENCOUNTER — Emergency Department (HOSPITAL_COMMUNITY): Payer: Self-pay

## 2014-06-27 ENCOUNTER — Inpatient Hospital Stay (HOSPITAL_COMMUNITY)
Admission: EM | Admit: 2014-06-27 | Discharge: 2014-06-29 | DRG: 563 | Disposition: A | Discharge status: Home / Self Care | Payer: Self-pay | Attending: General Surgery | Admitting: General Surgery

## 2014-06-27 ENCOUNTER — Encounter (HOSPITAL_COMMUNITY): Payer: Self-pay | Admitting: Emergency Medicine

## 2014-06-27 DIAGNOSIS — S92009A Unspecified fracture of unspecified calcaneus, initial encounter for closed fracture: Secondary | ICD-10-CM | POA: Diagnosis present

## 2014-06-27 DIAGNOSIS — S1093XA Contusion of unspecified part of neck, initial encounter: Secondary | ICD-10-CM

## 2014-06-27 DIAGNOSIS — L97529 Non-pressure chronic ulcer of other part of left foot with unspecified severity: Secondary | ICD-10-CM

## 2014-06-27 DIAGNOSIS — Z794 Long term (current) use of insulin: Secondary | ICD-10-CM

## 2014-06-27 DIAGNOSIS — D649 Anemia, unspecified: Secondary | ICD-10-CM | POA: Diagnosis present

## 2014-06-27 DIAGNOSIS — S92001A Unspecified fracture of right calcaneus, initial encounter for closed fracture: Secondary | ICD-10-CM

## 2014-06-27 DIAGNOSIS — IMO0002 Reserved for concepts with insufficient information to code with codable children: Secondary | ICD-10-CM | POA: Diagnosis present

## 2014-06-27 DIAGNOSIS — L97409 Non-pressure chronic ulcer of unspecified heel and midfoot with unspecified severity: Secondary | ICD-10-CM | POA: Diagnosis present

## 2014-06-27 DIAGNOSIS — S0033XA Contusion of nose, initial encounter: Secondary | ICD-10-CM

## 2014-06-27 DIAGNOSIS — S82201A Unspecified fracture of shaft of right tibia, initial encounter for closed fracture: Secondary | ICD-10-CM

## 2014-06-27 DIAGNOSIS — S0083XA Contusion of other part of head, initial encounter: Secondary | ICD-10-CM | POA: Diagnosis present

## 2014-06-27 DIAGNOSIS — L97509 Non-pressure chronic ulcer of other part of unspecified foot with unspecified severity: Secondary | ICD-10-CM

## 2014-06-27 DIAGNOSIS — S0003XA Contusion of scalp, initial encounter: Secondary | ICD-10-CM | POA: Diagnosis present

## 2014-06-27 DIAGNOSIS — E119 Type 2 diabetes mellitus without complications: Secondary | ICD-10-CM

## 2014-06-27 DIAGNOSIS — S82109A Unspecified fracture of upper end of unspecified tibia, initial encounter for closed fracture: Principal | ICD-10-CM | POA: Diagnosis present

## 2014-06-27 DIAGNOSIS — Z833 Family history of diabetes mellitus: Secondary | ICD-10-CM

## 2014-06-27 DIAGNOSIS — E08621 Diabetes mellitus due to underlying condition with foot ulcer: Secondary | ICD-10-CM

## 2014-06-27 DIAGNOSIS — E11621 Type 2 diabetes mellitus with foot ulcer: Secondary | ICD-10-CM

## 2014-06-27 DIAGNOSIS — E1169 Type 2 diabetes mellitus with other specified complication: Secondary | ICD-10-CM

## 2014-06-27 DIAGNOSIS — Z8544 Personal history of malignant neoplasm of other female genital organs: Secondary | ICD-10-CM

## 2014-06-27 DIAGNOSIS — S82209A Unspecified fracture of shaft of unspecified tibia, initial encounter for closed fracture: Secondary | ICD-10-CM

## 2014-06-27 DIAGNOSIS — E1165 Type 2 diabetes mellitus with hyperglycemia: Secondary | ICD-10-CM | POA: Diagnosis present

## 2014-06-27 HISTORY — DX: Type 2 diabetes mellitus without complications: E11.9

## 2014-06-27 LAB — HIV RAPID SCREEN (BLD OR BODY FLD EXPOSURE): Rapid HIV Screen: NONREACTIVE

## 2014-06-27 MED ORDER — OXYCODONE-ACETAMINOPHEN 5-325 MG PO TABS
1.0000 | ORAL_TABLET | Freq: Once | ORAL | Status: AC
  Administered 2014-06-27: 1 via ORAL
  Filled 2014-06-27: qty 1

## 2014-06-27 MED ORDER — TETANUS-DIPHTH-ACELL PERTUSSIS 5-2.5-18.5 LF-MCG/0.5 IM SUSP
0.5000 mL | Freq: Once | INTRAMUSCULAR | Status: AC
Start: 2014-06-27 — End: 2014-06-27
  Administered 2014-06-27: 0.5 mL via INTRAMUSCULAR
  Filled 2014-06-27: qty 0.5
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
## 2014-06-27 NOTE — ED Notes (Signed)
Pt. Has verbally agreed to have lab work drawn for blood exposure via translator line per [employer]. (patient is source, [employer] officer exposed to patient blood with bare hands).
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
## 2014-06-28 ENCOUNTER — Encounter (HOSPITAL_COMMUNITY): Payer: Self-pay

## 2014-06-28 ENCOUNTER — Encounter (HOSPITAL_COMMUNITY): Admission: EM | Disposition: A | Discharge status: Home / Self Care | Payer: Self-pay | Source: Home / Self Care

## 2014-06-28 ENCOUNTER — Encounter (HOSPITAL_COMMUNITY): Payer: Self-pay | Admitting: Certified Registered Nurse Anesthetist

## 2014-06-28 ENCOUNTER — Emergency Department (HOSPITAL_COMMUNITY): Payer: Self-pay

## 2014-06-28 DIAGNOSIS — E11621 Type 2 diabetes mellitus with foot ulcer: Secondary | ICD-10-CM | POA: Diagnosis present

## 2014-06-28 DIAGNOSIS — S0003XA Contusion of scalp, initial encounter: Secondary | ICD-10-CM

## 2014-06-28 DIAGNOSIS — S0033XA Contusion of nose, initial encounter: Secondary | ICD-10-CM

## 2014-06-28 DIAGNOSIS — S92009A Unspecified fracture of unspecified calcaneus, initial encounter for closed fracture: Secondary | ICD-10-CM

## 2014-06-28 DIAGNOSIS — D649 Anemia, unspecified: Secondary | ICD-10-CM

## 2014-06-28 DIAGNOSIS — S1093XA Contusion of unspecified part of neck, initial encounter: Secondary | ICD-10-CM

## 2014-06-28 DIAGNOSIS — S0083XA Contusion of other part of head, initial encounter: Secondary | ICD-10-CM

## 2014-06-28 DIAGNOSIS — R112 Nausea with vomiting, unspecified: Secondary | ICD-10-CM

## 2014-06-28 DIAGNOSIS — S82209A Unspecified fracture of shaft of unspecified tibia, initial encounter for closed fracture: Secondary | ICD-10-CM

## 2014-06-28 DIAGNOSIS — L97509 Non-pressure chronic ulcer of other part of unspecified foot with unspecified severity: Secondary | ICD-10-CM

## 2014-06-28 DIAGNOSIS — E1369 Other specified diabetes mellitus with other specified complication: Secondary | ICD-10-CM

## 2014-06-28 DIAGNOSIS — L97529 Non-pressure chronic ulcer of other part of left foot with unspecified severity: Secondary | ICD-10-CM

## 2014-06-28 LAB — CBC WITH DIFFERENTIAL/PLATELET
BASOS PCT: 0 % (ref 0–1)
Basophils Absolute: 0 10*3/uL (ref 0.0–0.1)
EOS ABS: 0.2 10*3/uL (ref 0.0–0.7)
EOS PCT: 2 % (ref 0–5)
HCT: 34.8 % — ABNORMAL LOW (ref 36.0–46.0)
HEMOGLOBIN: 11.8 g/dL — AB (ref 12.0–15.0)
LYMPHS ABS: 1.2 10*3/uL (ref 0.7–4.0)
Lymphocytes Relative: 14 % (ref 12–46)
MCH: 29.5 pg (ref 26.0–34.0)
MCHC: 33.9 g/dL (ref 30.0–36.0)
MCV: 87 fL (ref 78.0–100.0)
MONO ABS: 0.5 10*3/uL (ref 0.1–1.0)
MONOS PCT: 6 % (ref 3–12)
NEUTROS PCT: 78 % — AB (ref 43–77)
Neutro Abs: 6.4 10*3/uL (ref 1.7–7.7)
Platelets: 309 10*3/uL (ref 150–400)
RBC: 4 MIL/uL (ref 3.87–5.11)
RDW: 12.8 % (ref 11.5–15.5)
WBC: 8.2 10*3/uL (ref 4.0–10.5)

## 2014-06-28 LAB — BASIC METABOLIC PANEL
ANION GAP: 15 (ref 5–15)
BUN: 11 mg/dL (ref 6–23)
CALCIUM: 8.8 mg/dL (ref 8.4–10.5)
CO2: 25 meq/L (ref 19–32)
Chloride: 99 mEq/L (ref 96–112)
Creatinine, Ser: 0.53 mg/dL (ref 0.50–1.10)
GFR calc non Af Amer: >90 mL/min (ref >90)
GLUCOSE: 319 mg/dL — AB (ref 70–99)
Potassium: 4.4 mEq/L (ref 3.7–5.3)
Sodium: 139 mEq/L (ref 137–147)

## 2014-06-28 LAB — CBC
HCT: 35.1 % — ABNORMAL LOW (ref 36.0–46.0)
HEMOGLOBIN: 11.6 g/dL — AB (ref 12.0–15.0)
MCH: 29.5 pg (ref 26.0–34.0)
MCHC: 33 g/dL (ref 30.0–36.0)
MCV: 89.3 fL (ref 78.0–100.0)
PLATELETS: 297 10*3/uL (ref 150–400)
RBC: 3.93 MIL/uL (ref 3.87–5.11)
RDW: 12.8 % (ref 11.5–15.5)
WBC: 6.5 10*3/uL (ref 4.0–10.5)

## 2014-06-28 LAB — I-STAT CHEM 8, ED
BUN: 13 mg/dL (ref 6–23)
CALCIUM ION: 1.17 mmol/L (ref 1.12–1.23)
CREATININE: 0.6 mg/dL (ref 0.50–1.10)
Chloride: 100 mEq/L (ref 96–112)
Glucose, Bld: 297 mg/dL — ABNORMAL HIGH (ref 70–99)
HEMATOCRIT: 40 % (ref 36.0–46.0)
HEMOGLOBIN: 13.6 g/dL (ref 12.0–15.0)
POTASSIUM: 4 meq/L (ref 3.7–5.3)
Sodium: 137 mEq/L (ref 137–147)
TCO2: 24 mmol/L (ref 0–100)

## 2014-06-28 LAB — GLUCOSE, CAPILLARY
GLUCOSE-CAPILLARY: 308 mg/dL — AB (ref 70–99)
GLUCOSE-CAPILLARY: 310 mg/dL — AB (ref 70–99)
Glucose-Capillary: 250 mg/dL — ABNORMAL HIGH (ref 70–99)
Glucose-Capillary: 229 mg/dL — ABNORMAL HIGH (ref 70–99)
Glucose-Capillary: 232 mg/dL — ABNORMAL HIGH (ref 70–99)

## 2014-06-28 LAB — HEPATITIS B SURFACE ANTIGEN: Hepatitis B Surface Ag: NEGATIVE

## 2014-06-28 LAB — HEMOGLOBIN A1C
Hgb A1c MFr Bld: 11.8 % — ABNORMAL HIGH (ref <5.7)
Mean Plasma Glucose: 292 mg/dL — ABNORMAL HIGH (ref <117)

## 2014-06-28 LAB — CBG MONITORING, ED: Glucose-Capillary: 280 mg/dL — ABNORMAL HIGH (ref 70–99)

## 2014-06-28 LAB — HEPATITIS C ANTIBODY (REFLEX): HCV Ab: NEGATIVE

## 2014-06-28 LAB — POC URINE PREG, ED: Preg Test, Ur: NEGATIVE

## 2014-06-28 SURGERY — PINNING, EXTREMITY, PERCUTANEOUS
Anesthesia: General

## 2014-06-28 MED ORDER — INSULIN ASPART 100 UNIT/ML ~~LOC~~ SOLN
0.0000 [IU] | SUBCUTANEOUS | Status: DC
  Administered 2014-06-28 (×3): 3 [IU] via SUBCUTANEOUS
  Administered 2014-06-28: 7 [IU] via SUBCUTANEOUS
  Administered 2014-06-29: 2 [IU] via SUBCUTANEOUS
  Administered 2014-06-29: 1 [IU] via SUBCUTANEOUS
  Administered 2014-06-29: 2 [IU] via SUBCUTANEOUS

## 2014-06-28 MED ORDER — FENTANYL CITRATE 0.05 MG/ML IJ SOLN
INTRAMUSCULAR | Status: AC
  Filled 2014-06-28: qty 5

## 2014-06-28 MED ORDER — ONDANSETRON HCL 4 MG PO TABS: 4.0000 mg | ORAL_TABLET | Freq: Four times a day (QID) | ORAL | Status: DC | PRN

## 2014-06-28 MED ORDER — SODIUM CHLORIDE 0.9 % IV SOLN
INTRAVENOUS | Status: DC
  Administered 2014-06-28: 23:00:00 via INTRAVENOUS

## 2014-06-28 MED ORDER — IOHEXOL 300 MG/ML  SOLN
100.0000 mL | Freq: Once | INTRAMUSCULAR | Status: AC | PRN
  Administered 2014-06-28: 100 mL via INTRAVENOUS

## 2014-06-28 MED ORDER — PROPOFOL 10 MG/ML IV BOLUS
INTRAVENOUS | Status: AC
  Filled 2014-06-28: qty 20

## 2014-06-28 MED ORDER — VANCOMYCIN HCL IN DEXTROSE 1-5 GM/200ML-% IV SOLN
1000.0000 mg | Freq: Once | INTRAVENOUS | Status: AC
  Administered 2014-06-28: 1000 mg via INTRAVENOUS
  Filled 2014-06-28: qty 200

## 2014-06-28 MED ORDER — HYDROMORPHONE HCL PF 1 MG/ML IJ SOLN
0.5000 mg | INTRAMUSCULAR | Status: DC | PRN
  Administered 2014-06-28: 1 mg via INTRAVENOUS
  Filled 2014-06-28: qty 1

## 2014-06-28 MED ORDER — ACETAMINOPHEN 325 MG PO TABS: 650.0000 mg | ORAL_TABLET | Freq: Four times a day (QID) | ORAL | Status: DC | PRN

## 2014-06-28 MED ORDER — PROMETHAZINE HCL 25 MG/ML IJ SOLN
12.5000 mg | Freq: Once | INTRAMUSCULAR | Status: AC
  Administered 2014-06-28: 12.5 mg via INTRAVENOUS
  Filled 2014-06-28 (×3): qty 1

## 2014-06-28 MED ORDER — ONDANSETRON HCL 4 MG/2ML IJ SOLN
4.0000 mg | Freq: Four times a day (QID) | INTRAMUSCULAR | Status: DC | PRN
  Administered 2014-06-28 (×2): 4 mg via INTRAVENOUS
  Filled 2014-06-28 (×2): qty 2

## 2014-06-28 MED ORDER — INSULIN GLARGINE 100 UNIT/ML ~~LOC~~ SOLN
30.0000 [IU] | Freq: Every day | SUBCUTANEOUS | Status: DC
  Administered 2014-06-28 – 2014-06-29 (×2): 30 [IU] via SUBCUTANEOUS
  Filled 2014-06-28 (×2): qty 0.3

## 2014-06-28 MED ORDER — SODIUM CHLORIDE 0.9 % IV SOLN
INTRAVENOUS | Status: DC
  Administered 2014-06-28: 125 mL/h via INTRAVENOUS
  Administered 2014-06-28: 09:00:00 via INTRAVENOUS

## 2014-06-28 MED ORDER — MIDAZOLAM HCL 2 MG/2ML IJ SOLN
INTRAMUSCULAR | Status: AC
  Filled 2014-06-28: qty 2

## 2014-06-28 MED ORDER — PANTOPRAZOLE SODIUM 40 MG IV SOLR
40.0000 mg | Freq: Two times a day (BID) | INTRAVENOUS | Status: DC
  Administered 2014-06-28 – 2014-06-29 (×3): 40 mg via INTRAVENOUS
  Filled 2014-06-28 (×5): qty 40

## 2014-06-28 MED ORDER — ACETAMINOPHEN 650 MG RE SUPP: 650.0000 mg | Freq: Four times a day (QID) | RECTAL | Status: DC | PRN

## 2014-06-28 SURGICAL SUPPLY — 54 items
BANDAGE ELASTIC 4 VELCRO ST LF (GAUZE/BANDAGES/DRESSINGS) ×8 IMPLANT
BANDAGE ESMARK 6X9 LF (GAUZE/BANDAGES/DRESSINGS) IMPLANT
BNDG CMPR 9X6 STRL LF SNTH (GAUZE/BANDAGES/DRESSINGS)
BNDG ESMARK 6X9 LF (GAUZE/BANDAGES/DRESSINGS)
CANISTER SUCTION 2500CC (MISCELLANEOUS) ×4 IMPLANT
CLOSURE WOUND 1/2 X4 (GAUZE/BANDAGES/DRESSINGS) ×1
COVER SURGICAL LIGHT HANDLE (MISCELLANEOUS) ×4 IMPLANT
CUFF TOURNIQUET SINGLE 34IN LL (TOURNIQUET CUFF) IMPLANT
DRAPE C-ARM 42X72 X-RAY (DRAPES) IMPLANT
DRAPE C-ARMOR (DRAPES) ×4 IMPLANT
DRAPE ORTHO SPLIT 77X108 STRL (DRAPES)
DRAPE SURG ORHT 6 SPLT 77X108 (DRAPES) IMPLANT
DRAPE U-SHAPE 47X51 STRL (DRAPES) IMPLANT
DRSG PAD ABDOMINAL 8X10 ST (GAUZE/BANDAGES/DRESSINGS) ×8 IMPLANT
DURAPREP 26ML APPLICATOR (WOUND CARE) ×4 IMPLANT
ELECT REM PT RETURN 9FT ADLT (ELECTROSURGICAL) ×3
ELECTRODE REM PT RTRN 9FT ADLT (ELECTROSURGICAL) ×2 IMPLANT
GLOVE BIO SURGEON STRL SZ7.5 (GLOVE) ×8 IMPLANT
GLOVE BIO SURGEON STRL SZ8 (GLOVE) ×4 IMPLANT
GLOVE BIOGEL PI IND STRL 8 (GLOVE) ×2 IMPLANT
GLOVE BIOGEL PI INDICATOR 8 (GLOVE) ×2
GLOVE BIOGEL PI ORTHO PRO SZ8 (GLOVE)
GLOVE PI ORTHO PRO STRL SZ8 (GLOVE) IMPLANT
GOWN STRL REUS W/ TWL LRG LVL3 (GOWN DISPOSABLE) ×2 IMPLANT
GOWN STRL REUS W/ TWL XL LVL3 (GOWN DISPOSABLE) ×2 IMPLANT
GOWN STRL REUS W/TWL 2XL LVL3 (GOWN DISPOSABLE) IMPLANT
GOWN STRL REUS W/TWL LRG LVL3 (GOWN DISPOSABLE) ×3
GOWN STRL REUS W/TWL XL LVL3 (GOWN DISPOSABLE) ×3
KIT BASIN OR (CUSTOM PROCEDURE TRAY) ×4 IMPLANT
KIT ROOM TURNOVER OR (KITS) ×4 IMPLANT
MANIFOLD NEPTUNE II (INSTRUMENTS) ×4 IMPLANT
NEEDLE 22X1 1/2 (OR ONLY) (NEEDLE) ×4 IMPLANT
NS IRRIG 1000ML POUR BTL (IV SOLUTION) ×4 IMPLANT
PACK ORTHO EXTREMITY (CUSTOM PROCEDURE TRAY) ×4 IMPLANT
PAD ARMBOARD 7.5X6 YLW CONV (MISCELLANEOUS) ×8 IMPLANT
PAD CAST 4YDX4 CTTN HI CHSV (CAST SUPPLIES) ×4 IMPLANT
PADDING CAST COTTON 4X4 STRL (CAST SUPPLIES) ×6
SPONGE GAUZE 4X4 12PLY (GAUZE/BANDAGES/DRESSINGS) ×4 IMPLANT
SPONGE LAP 18X18 X RAY DECT (DISPOSABLE) ×4 IMPLANT
STRIP CLOSURE SKIN 1/2X4 (GAUZE/BANDAGES/DRESSINGS) ×3 IMPLANT
SUCTION FRAZIER TIP 10 FR DISP (SUCTIONS) ×4 IMPLANT
SUT MNCRL AB 4-0 PS2 18 (SUTURE) ×8 IMPLANT
SUT MON AB 2-0 CT1 27 (SUTURE) ×4 IMPLANT
SUT VIC AB 0 CT1 27 (SUTURE)
SUT VIC AB 0 CT1 27XBRD ANBCTR (SUTURE) IMPLANT
SYR BULB IRRIGATION 50ML (SYRINGE) ×4 IMPLANT
SYR CONTROL 10ML LL (SYRINGE) IMPLANT
TOWEL OR 17X24 6PK STRL BLUE (TOWEL DISPOSABLE) ×4 IMPLANT
TOWEL OR 17X26 10 PK STRL BLUE (TOWEL DISPOSABLE) ×4 IMPLANT
TUBE CONNECTING 12'X1/4 (SUCTIONS) ×1
TUBE CONNECTING 12X1/4 (SUCTIONS) ×3 IMPLANT
UNDERPAD 30X30 INCONTINENT (UNDERPADS AND DIAPERS) ×4 IMPLANT
WATER STERILE IRR 1000ML POUR (IV SOLUTION) ×4 IMPLANT
YANKAUER SUCT BULB TIP NO VENT (SUCTIONS) IMPLANT
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
## 2014-06-28 NOTE — Progress Notes (Signed)
Utilization review completed. Deshannon Hinchliffe, RN, BSN. 

## 2014-06-28 NOTE — ED Notes (Signed)
Orthopedic tech called and notified of MD orders.
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
## 2014-06-28 NOTE — ED Notes (Signed)
Pt is spanish speaker, pt assisted on her own language and POC explained to her.
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
## 2014-06-29 DIAGNOSIS — E1169 Type 2 diabetes mellitus with other specified complication: Secondary | ICD-10-CM

## 2014-06-29 DIAGNOSIS — E119 Type 2 diabetes mellitus without complications: Secondary | ICD-10-CM

## 2014-06-29 LAB — GLUCOSE, CAPILLARY
GLUCOSE-CAPILLARY: 112 mg/dL — AB (ref 70–99)
GLUCOSE-CAPILLARY: 140 mg/dL — AB (ref 70–99)
GLUCOSE-CAPILLARY: 207 mg/dL — AB (ref 70–99)
Glucose-Capillary: 174 mg/dL — ABNORMAL HIGH (ref 70–99)
Glucose-Capillary: 191 mg/dL — ABNORMAL HIGH (ref 70–99)

## 2014-06-29 LAB — CBC
HCT: 28.8 % — ABNORMAL LOW (ref 36.0–46.0)
HCT: 31.2 % — ABNORMAL LOW (ref 36.0–46.0)
Hemoglobin: 10.2 g/dL — ABNORMAL LOW (ref 12.0–15.0)
Hemoglobin: 9.8 g/dL — ABNORMAL LOW (ref 12.0–15.0)
MCH: 30.3 pg (ref 26.0–34.0)
MCH: 29.4 pg (ref 26.0–34.0)
MCHC: 32.7 g/dL (ref 30.0–36.0)
MCHC: 34 g/dL (ref 30.0–36.0)
MCV: 89.2 fL (ref 78.0–100.0)
MCV: 89.9 fL (ref 78.0–100.0)
PLATELETS: 269 10*3/uL (ref 150–400)
Platelets: 279 10*3/uL (ref 150–400)
RBC: 3.23 MIL/uL — AB (ref 3.87–5.11)
RBC: 3.47 MIL/uL — ABNORMAL LOW (ref 3.87–5.11)
RDW: 13.1 % (ref 11.5–15.5)
RDW: 13.3 % (ref 11.5–15.5)
WBC: 4.8 10*3/uL (ref 4.0–10.5)
WBC: 6.1 10*3/uL (ref 4.0–10.5)

## 2014-06-29 LAB — BASIC METABOLIC PANEL
Anion gap: 8 (ref 5–15)
BUN: 11 mg/dL (ref 6–23)
CALCIUM: 8 mg/dL — AB (ref 8.4–10.5)
CO2: 26 meq/L (ref 19–32)
Chloride: 109 mEq/L (ref 96–112)
Creatinine, Ser: 0.63 mg/dL (ref 0.50–1.10)
GFR calc Af Amer: >90 mL/min (ref >90)
GFR calc non Af Amer: >90 mL/min (ref >90)
Glucose, Bld: 197 mg/dL — ABNORMAL HIGH (ref 70–99)
Potassium: 4 mEq/L (ref 3.7–5.3)
Sodium: 143 mEq/L (ref 137–147)

## 2014-06-29 MED ORDER — POLYETHYLENE GLYCOL 3350 17 G PO PACK
17.0000 g | PACK | Freq: Every day | ORAL | Status: DC
  Administered 2014-06-29: 17 g via ORAL
  Filled 2014-06-29 (×2): qty 1

## 2014-06-29 MED ORDER — PANTOPRAZOLE SODIUM 40 MG PO TBEC: 40.0000 mg | DELAYED_RELEASE_TABLET | Freq: Every day | ORAL | Status: DC

## 2014-06-29 MED ORDER — TRAMADOL HCL 50 MG PO TABS: 50.0000 mg | ORAL_TABLET | Freq: Four times a day (QID) | ORAL | Status: DC | PRN

## 2014-06-29 MED ORDER — DOCUSATE SODIUM 100 MG PO CAPS
100.0000 mg | ORAL_CAPSULE | Freq: Two times a day (BID) | ORAL | Status: DC
  Administered 2014-06-29: 100 mg via ORAL
  Filled 2014-06-29: qty 1

## 2014-06-29 MED ORDER — PANTOPRAZOLE SODIUM 40 MG PO TBEC: 40.0000 mg | DELAYED_RELEASE_TABLET | Freq: Two times a day (BID) | ORAL | Status: DC

## 2014-06-29 MED ORDER — HYDROMORPHONE HCL PF 1 MG/ML IJ SOLN: 0.5000 mg | INTRAMUSCULAR | Status: DC | PRN

## 2014-06-29 MED ORDER — INSULIN GLARGINE 100 UNIT/ML ~~LOC~~ SOLN: 30.0000 [IU] | Freq: Every day | SUBCUTANEOUS | Status: DC
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
## 2014-06-29 NOTE — Clinical Social Work Note (Addendum)
Clinical Social Work Department BRIEF PSYCHOSOCIAL ASSESSMENT 06/29/2014  Patient:  [name], [name]     Account Number:  [id number]     Admit date:  06/27/2014  Clinical Social Worker:  Myles Lipps  Date/Time:  06/29/2014 03:00 PM  Referred by:  Physician  Date Referred:  06/29/2014 Referred for  Psychosocial assessment  Other - See comment   Other Referral:   Transportation concerns   Interview type:  Patient Other interview type:   Patient with limited English and friend at bedside speaks no [place]    PSYCHOSOCIAL DATA Living Status:  SIGNIFICANT OTHER Admitted from facility:   Level of care:   Primary support name:  [name]   [phone] Primary support relationship to patient:  FRIEND Degree of support available:   Adequate    CURRENT CONCERNS Current Concerns  None Noted   Other Concerns:    SOCIAL WORK ASSESSMENT / PLAN Clinical Social Worker met with patient and patient friend at bedside to offer support and discuss patient needs at discharge.  Patient states that she lives at home with her friend, [name].  Patient is agreeable with return home and states that [name] will be available to help as much as possible.  Patient requested a cab voucher at discharge, due to the bus being their only form of tranportation.  CSW provided RN with cab voucher for patient at discharge.    Clinical Social Worker inquired about current substance use.  Patient states that she does not drink and/or use drugs.  Patient with no concerns at this time regarding any type of use.  SBIRT complete.  No resources needed.  CSW signing off at this time.  Please reconsult if further needs arise prior to discharge.   Assessment/plan status:  Psychosocial Support/Ongoing Assessment of Needs Other assessment/ plan:   Information/referral to community resources:   Cab voucher at discharge.    PATIENT'S/FAMILY'S RESPONSE TO PLAN OF CARE: Patient alert and oriented x3 sitting up  in the chair with limited English.  Patient answered questions appropriately and questioned areas of discussion that she did not understand the first time.  Patient agreeable with discharge home and is hopeful to leave today.  Patient with limited supports at bedside but feels she will have adequate support at home.  Patient verbalized appreciation for CSW support and concern.

## 2014-06-29 NOTE — Discharge Instructions (Signed)
Wear knee immobilizer and splint at all times. Do not put weight down on your right leg. Keep splint dry.

## 2014-06-29 NOTE — Progress Notes (Signed)
Good pain control, some L rib soreness. Does not want surgery on calcaneus. Will see how it goes with therapies. Patient examined and I agree with the assessment and plan  Georganna Skeans, MD, MPH, FACS Trauma: [phone] General Surgery: [phone]  06/29/2014 10:27 AM
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
## 2014-06-29 NOTE — Discharge Summary (Signed)
Antonetta Clanton, MD, MPH, FACS Trauma: [phone] General Surgery: [phone]  

## 2014-06-29 NOTE — Progress Notes (Signed)
I participated in the care of this patient and agree with the above history, physical and evaluation. I performed a review of the history and a physical exam as detailed  We have again advised her of the risks of non-operative treatment and my concerns for skin breakdown. I have strongly advised her against this but she refuses surgery  Carole Binning MD

## 2014-06-29 NOTE — Progress Notes (Signed)
Changed Protonix from IV to PO per pharmacy protocol.
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
## 2014-06-29 NOTE — Progress Notes (Signed)
Patient noncompliant with bedrest and NWB.  Instructed via interpreter importance of NWB d/t fractures, healing, etc.  Instructed to use call bell to call for assistance with toileting needs.  Patient denies pain and nausea.  Was able to eat 75% of clear liquid dinner tray last evening.    Patient reported through interpreter that she does not want surgery because she is afraid of getting an infection and losing her leg.  States that "God will heal her."  Husband visited during the night bringing her bible and patient observed praying.  Continue to provide emotional support and reinforce safety precautions.
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
## 2014-07-04 ENCOUNTER — Emergency Department (HOSPITAL_COMMUNITY): Payer: Self-pay

## 2014-07-04 ENCOUNTER — Encounter (HOSPITAL_COMMUNITY): Payer: Self-pay | Admitting: Emergency Medicine

## 2014-07-04 ENCOUNTER — Emergency Department (HOSPITAL_COMMUNITY)
Admission: EM | Admit: 2014-07-04 | Discharge: 2014-07-04 | Disposition: A | Discharge status: Home / Self Care | Payer: Self-pay | Attending: Emergency Medicine | Admitting: Emergency Medicine

## 2014-07-04 DIAGNOSIS — L97509 Non-pressure chronic ulcer of other part of unspecified foot with unspecified severity: Secondary | ICD-10-CM | POA: Insufficient documentation

## 2014-07-04 DIAGNOSIS — Z87828 Personal history of other (healed) physical injury and trauma: Secondary | ICD-10-CM | POA: Insufficient documentation

## 2014-07-04 DIAGNOSIS — IMO0002 Reserved for concepts with insufficient information to code with codable children: Secondary | ICD-10-CM | POA: Insufficient documentation

## 2014-07-04 DIAGNOSIS — Z794 Long term (current) use of insulin: Secondary | ICD-10-CM | POA: Insufficient documentation

## 2014-07-04 DIAGNOSIS — L89512 Pressure ulcer of right ankle, stage 2: Secondary | ICD-10-CM

## 2014-07-04 DIAGNOSIS — L89509 Pressure ulcer of unspecified ankle, unspecified stage: Secondary | ICD-10-CM | POA: Insufficient documentation

## 2014-07-04 DIAGNOSIS — IMO0001 Reserved for inherently not codable concepts without codable children: Secondary | ICD-10-CM | POA: Insufficient documentation

## 2014-07-04 DIAGNOSIS — E1165 Type 2 diabetes mellitus with hyperglycemia: Secondary | ICD-10-CM | POA: Insufficient documentation

## 2014-07-04 DIAGNOSIS — E1169 Type 2 diabetes mellitus with other specified complication: Secondary | ICD-10-CM

## 2014-07-04 DIAGNOSIS — L8992 Pressure ulcer of unspecified site, stage 2: Secondary | ICD-10-CM | POA: Insufficient documentation

## 2014-07-04 DIAGNOSIS — Z8669 Personal history of other diseases of the nervous system and sense organs: Secondary | ICD-10-CM | POA: Insufficient documentation

## 2014-07-04 DIAGNOSIS — M7989 Other specified soft tissue disorders: Secondary | ICD-10-CM | POA: Insufficient documentation

## 2014-07-04 DIAGNOSIS — Z8544 Personal history of malignant neoplasm of other female genital organs: Secondary | ICD-10-CM | POA: Insufficient documentation

## 2014-07-04 DIAGNOSIS — S92001S Unspecified fracture of right calcaneus, sequela: Secondary | ICD-10-CM

## 2014-07-04 LAB — CBC WITH DIFFERENTIAL/PLATELET
BASOS ABS: 0 10*3/uL (ref 0.0–0.1)
Basophils Relative: 0 % (ref 0–1)
EOS ABS: 0.3 10*3/uL (ref 0.0–0.7)
Eosinophils Relative: 4 % (ref 0–5)
HEMATOCRIT: 29.3 % — AB (ref 36.0–46.0)
HEMOGLOBIN: 9.8 g/dL — AB (ref 12.0–15.0)
Lymphocytes Relative: 21 % (ref 12–46)
Lymphs Abs: 1.6 10*3/uL (ref 0.7–4.0)
MCH: 29.7 pg (ref 26.0–34.0)
MCHC: 33.4 g/dL (ref 30.0–36.0)
MCV: 88.8 fL (ref 78.0–100.0)
MONO ABS: 0.4 10*3/uL (ref 0.1–1.0)
MONOS PCT: 5 % (ref 3–12)
NEUTROS PCT: 70 % (ref 43–77)
Neutro Abs: 5.3 10*3/uL (ref 1.7–7.7)
Platelets: 378 10*3/uL (ref 150–400)
RBC: 3.3 MIL/uL — ABNORMAL LOW (ref 3.87–5.11)
RDW: 12.8 % (ref 11.5–15.5)
WBC: 7.6 10*3/uL (ref 4.0–10.5)

## 2014-07-04 LAB — COMPREHENSIVE METABOLIC PANEL
ALT: 13 U/L (ref 0–35)
ANION GAP: 12 (ref 5–15)
AST: 14 U/L (ref 0–37)
Albumin: 3.3 g/dL — ABNORMAL LOW (ref 3.5–5.2)
Alkaline Phosphatase: 97 U/L (ref 39–117)
BUN: 7 mg/dL (ref 6–23)
CALCIUM: 9.2 mg/dL (ref 8.4–10.5)
CO2: 26 mEq/L (ref 19–32)
Chloride: 104 mEq/L (ref 96–112)
Creatinine, Ser: 0.76 mg/dL (ref 0.50–1.10)
GFR calc Af Amer: >90 mL/min (ref >90)
GFR calc non Af Amer: >90 mL/min (ref >90)
Glucose, Bld: 91 mg/dL (ref 70–99)
Potassium: 4 mEq/L (ref 3.7–5.3)
Sodium: 142 mEq/L (ref 137–147)
TOTAL PROTEIN: 7.5 g/dL (ref 6.0–8.3)
Total Bilirubin: 0.3 mg/dL (ref 0.3–1.2)

## 2014-07-04 LAB — CBG MONITORING, ED: Glucose-Capillary: 99 mg/dL (ref 70–99)

## 2014-07-04 MED ORDER — OXYCODONE-ACETAMINOPHEN 5-325 MG PO TABS
2.0000 | ORAL_TABLET | Freq: Once | ORAL | Status: AC
  Administered 2014-07-04: 2 via ORAL
  Filled 2014-07-04: qty 2

## 2014-07-04 MED ORDER — OXYCODONE-ACETAMINOPHEN 5-325 MG PO TABS: 1.0000 | ORAL_TABLET | Freq: Four times a day (QID) | ORAL | Status: DC | PRN
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
## 2014-07-04 NOTE — Progress Notes (Signed)
Orthopedic Tech Progress Note Patient Details:  [name] April 30, 1966 [id number]  Ortho Devices Type of Ortho Device: Ace wrap;Stirrup splint;Post (short leg) splint;Crutches Ortho Device/Splint Interventions: Application   Cammer, Theodoro Parma 07/04/2014, 2:31 PM

## 2014-07-04 NOTE — Discharge Instructions (Signed)
Take percocet for pain.   Don't take splint off. Try not to bear weight. Use crutches.   You need to see your orthopedic doctor.   Return to ER if you have worse pain, purulent drainage from wound
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
## 2014-08-14 ENCOUNTER — Encounter (HOSPITAL_COMMUNITY): Payer: Self-pay | Admitting: Emergency Medicine

## 2014-08-28 ENCOUNTER — Emergency Department (HOSPITAL_COMMUNITY)
Admission: EM | Admit: 2014-08-28 | Discharge: 2014-08-28 | Discharge status: Against Medical Advice | Payer: Self-pay | Attending: Emergency Medicine | Admitting: Emergency Medicine

## 2014-08-28 ENCOUNTER — Encounter (HOSPITAL_COMMUNITY): Payer: Self-pay | Admitting: Emergency Medicine

## 2014-08-28 DIAGNOSIS — E1169 Type 2 diabetes mellitus with other specified complication: Secondary | ICD-10-CM | POA: Insufficient documentation

## 2014-08-28 DIAGNOSIS — L97909 Non-pressure chronic ulcer of unspecified part of unspecified lower leg with unspecified severity: Secondary | ICD-10-CM | POA: Insufficient documentation

## 2014-08-28 DIAGNOSIS — R42 Dizziness and giddiness: Secondary | ICD-10-CM | POA: Insufficient documentation
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
## 2014-08-28 NOTE — ED Notes (Signed)
Pt paged x 3 with no response for room response.

## 2014-12-11 ENCOUNTER — Ambulatory Visit: Payer: MEDICAID | Admitting: Internal Medicine

## 2014-12-13 ENCOUNTER — Encounter (HOSPITAL_COMMUNITY): Payer: Self-pay | Admitting: Orthopedic Surgery

## 2014-12-20 ENCOUNTER — Ambulatory Visit: Payer: Self-pay | Attending: Internal Medicine | Admitting: Internal Medicine

## 2014-12-20 ENCOUNTER — Encounter: Payer: Self-pay | Admitting: Internal Medicine

## 2014-12-20 VITALS — BP 113/76 | HR 80 | Temp 98.0°F | Resp 16 | Wt 125.4 lb

## 2014-12-20 DIAGNOSIS — H538 Other visual disturbances: Secondary | ICD-10-CM | POA: Insufficient documentation

## 2014-12-20 DIAGNOSIS — E119 Type 2 diabetes mellitus without complications: Secondary | ICD-10-CM | POA: Insufficient documentation

## 2014-12-20 DIAGNOSIS — Z23 Encounter for immunization: Secondary | ICD-10-CM | POA: Insufficient documentation

## 2014-12-20 DIAGNOSIS — Z1231 Encounter for screening mammogram for malignant neoplasm of breast: Secondary | ICD-10-CM

## 2014-12-20 DIAGNOSIS — Z1211 Encounter for screening for malignant neoplasm of colon: Secondary | ICD-10-CM

## 2014-12-20 DIAGNOSIS — E139 Other specified diabetes mellitus without complications: Secondary | ICD-10-CM

## 2014-12-20 DIAGNOSIS — Z89422 Acquired absence of other left toe(s): Secondary | ICD-10-CM | POA: Insufficient documentation

## 2014-12-20 LAB — CBC WITH DIFFERENTIAL/PLATELET
Basophils Absolute: 0.1 10*3/uL (ref 0.0–0.1)
Basophils Relative: 1 % (ref 0–1)
Eosinophils Absolute: 0.2 10*3/uL (ref 0.0–0.7)
Eosinophils Relative: 4 % (ref 0–5)
HCT: 38.5 % (ref 36.0–46.0)
HEMOGLOBIN: 12.7 g/dL (ref 12.0–15.0)
Lymphocytes Relative: 39 % (ref 12–46)
Lymphs Abs: 2.3 10*3/uL (ref 0.7–4.0)
MCH: 29.3 pg (ref 26.0–34.0)
MCHC: 33 g/dL (ref 30.0–36.0)
MCV: 88.7 fL (ref 78.0–100.0)
MPV: 9.2 fL (ref 8.6–12.4)
Monocytes Absolute: 0.3 10*3/uL (ref 0.1–1.0)
Monocytes Relative: 6 % (ref 3–12)
Neutro Abs: 2.9 10*3/uL (ref 1.7–7.7)
Neutrophils Relative %: 50 % (ref 43–77)
PLATELETS: 366 10*3/uL (ref 150–400)
RBC: 4.34 MIL/uL (ref 3.87–5.11)
RDW: 13.7 % (ref 11.5–15.5)
WBC: 5.8 10*3/uL (ref 4.0–10.5)

## 2014-12-20 LAB — POCT GLYCOSYLATED HEMOGLOBIN (HGB A1C): Hemoglobin A1C: 13

## 2014-12-20 LAB — TSH: TSH: 1.854 u[IU]/mL (ref 0.350–4.500)

## 2014-12-20 LAB — COMPLETE METABOLIC PANEL WITH GFR
ALT: 14 U/L (ref 0–35)
AST: 11 U/L (ref 0–37)
Albumin: 4.2 g/dL (ref 3.5–5.2)
Alkaline Phosphatase: 161 U/L — ABNORMAL HIGH (ref 39–117)
BUN: 22 mg/dL (ref 6–23)
CO2: 22 mEq/L (ref 19–32)
CREATININE: 0.68 mg/dL (ref 0.50–1.10)
Calcium: 9.1 mg/dL (ref 8.4–10.5)
Chloride: 110 mEq/L (ref 96–112)
GFR, Est African American: >89 mL/min
GFR, Est Non African American: >89 mL/min
GLUCOSE: 104 mg/dL — AB (ref 70–99)
Potassium: 4.4 mEq/L (ref 3.5–5.3)
Sodium: 140 mEq/L (ref 135–145)
TOTAL PROTEIN: 7.6 g/dL (ref 6.0–8.3)
Total Bilirubin: 0.4 mg/dL (ref 0.2–1.2)

## 2014-12-20 LAB — LIPID PANEL
CHOLESTEROL: 209 mg/dL — AB (ref 0–200)
HDL: 57 mg/dL (ref >39)
LDL Cholesterol: 115 mg/dL — ABNORMAL HIGH (ref 0–99)
Total CHOL/HDL Ratio: 3.7 Ratio
Triglycerides: 184 mg/dL — ABNORMAL HIGH (ref <150)
VLDL: 37 mg/dL (ref 0–40)

## 2014-12-20 LAB — GLUCOSE, POCT (MANUAL RESULT ENTRY): POC Glucose: 163 mg/dl — AB (ref 70–99)

## 2014-12-20 MED ORDER — FREESTYLE SYSTEM KIT: 1.0000 | PACK | Freq: Three times a day (TID) | Status: DC

## 2014-12-20 NOTE — Progress Notes (Signed)
   Patient Demographics  [name], is a 48 y.o. female  CSN:[id number]  MRN:[id number]  DOB - 07/11/1966  CC:  Chief Complaint  Patient presents with  . Establish Care       HPI: [name] is a 48 y.o. female here today to establish medical care.patient has history of diabetesfor more than 15 years as per patient currently she is taking Lantus 20 units and NovoLog 10 units with each meal, as per patient she does not have a glucometer and has not checked her blood sugar level for the last one year, previously she has history of diabetic foot ulcer,osteomyelitis and amputation of left foot fifth toe, as per patient she has not seen a podiatrist, she also history of cataract, recently have been having blurry vision and is requesting referral to see ophthalmologist. Patient has No headache, No chest pain, No abdominal pain - No Nausea, No new weakness tingling or numbness, No Cough - SOB.  No Known Allergies Past Medical History  Diagnosis Date  . Diabetes mellitus   . 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 Wendy Brewster), Cysto performed by Dr. L. Pterson 12/2009 with evac of clots and bx and fulguration. // H/O stage 2 SCC of the vulva  . Anemia     2/2 blood loss  . Diabetes mellitus type 2, uncontrolled DX: 2001  . 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 Diabetic foot ulcers   . Cataract   . Diabetes mellitus without complication    Current Outpatient Prescriptions on File Prior to Visit  Medication Sig Dispense Refill  . insulin aspart (NOVOLOG) 100 UNIT/ML injection Inject 10 Units into the skin 3 (three) times daily before meals.     . insulin glargine (LANTUS) 100 UNIT/ML injection Inject 20 Units into the skin at bedtime.    .  clindamycin (CLEOCIN) 300 MG capsule Take 1 capsule (300 mg total) by mouth 4 (four) times daily. X 7 days 28 capsule 0  . HYDROcodone-acetaminophen (NORCO) 5-325 MG per tablet Take 1 tablet by mouth every 4 (four) hours as needed for pain. 20 tablet 0  . insulin aspart (NOVOLOG) 100 UNIT/ML injection Inject 3 Units into the skin 3 (three) times daily before meals.    . insulin glargine (LANTUS) 100 UNIT/ML injection Inject 20 Units into the skin at bedtime.     . oxyCODONE-acetaminophen (PERCOCET) 5-325 MG per tablet Take 1-2 tablets by mouth every 6 (six) hours as needed. 15 tablet 0   No current facility-administered medications on file prior to visit.   Family History  Problem Relation Age of Onset  . Diabetes Mother   . Diabetes Father   . Diabetes Brother    History   Social History  . Marital Status: Married    Spouse Name: N/A    Number of Children: N/A  . Years of Education: N/A   Occupational History  . Not on file.   Social History Main Topics  . Smoking status: Never Smoker   . Smokeless tobacco: Never Used  . Alcohol Use: No  . Drug Use: No  . Sexual Activity: Yes   Other Topics Concern  . Not on file   Social History Narrative   ** Merged History Encounter **             Patient Demographics  [name], is a 48 y.o. female  CSN:[id number]  MRN:[id number]  DOB - 07/11/1966  CC:  Chief Complaint  Patient presents with  . Establish Care       HPI: [name] is a 48 y.o. female here today to establish medical care.patient has history of diabetesfor more than 15 years as per patient currently she is taking Lantus 20 units and NovoLog 10 units with each meal, as per patient she does not have a glucometer and has not checked her blood sugar level for the last one year, previously she has history of diabetic foot ulcer,osteomyelitis and amputation of left foot fifth toe, as per patient she has not seen a podiatrist, she also history of cataract, recently have been having blurry vision and is requesting referral to see ophthalmologist. Patient has No headache, No chest pain, No abdominal pain - No Nausea, No new weakness tingling or numbness, No Cough - SOB.  No Known Allergies Past Medical History  Diagnosis Date  . Diabetes mellitus   . 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 Wendy Brewster), Cysto performed by Dr. L. Pterson 12/2009 with evac of clots and bx and fulguration. // H/O stage 2 SCC of the vulva  . Anemia     2/2 blood loss  . Diabetes mellitus type 2, uncontrolled DX: 2001  . 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 Diabetic foot ulcers   . Cataract   . Diabetes mellitus without complication    Current Outpatient Prescriptions on File Prior to Visit  Medication Sig Dispense Refill  . insulin aspart (NOVOLOG) 100 UNIT/ML injection Inject 10 Units into the skin 3 (three) times daily before meals.     . insulin glargine (LANTUS) 100 UNIT/ML injection Inject 20 Units into the skin at bedtime.    .  clindamycin (CLEOCIN) 300 MG capsule Take 1 capsule (300 mg total) by mouth 4 (four) times daily. X 7 days 28 capsule 0  . HYDROcodone-acetaminophen (NORCO) 5-325 MG per tablet Take 1 tablet by mouth every 4 (four) hours as needed for pain. 20 tablet 0  . insulin aspart (NOVOLOG) 100 UNIT/ML injection Inject 3 Units into the skin 3 (three) times daily before meals.    . insulin glargine (LANTUS) 100 UNIT/ML injection Inject 20 Units into the skin at bedtime.     . oxyCODONE-acetaminophen (PERCOCET) 5-325 MG per tablet Take 1-2 tablets by mouth every 6 (six) hours as needed. 15 tablet 0   No current facility-administered medications on file prior to visit.   Family History  Problem Relation Age of Onset  . Diabetes Mother   . Diabetes Father   . Diabetes Brother    History   Social History  . Marital Status: Married    Spouse Name: N/A    Number of Children: N/A  . Years of Education: N/A   Occupational History  . Not on file.   Social History Main Topics  . Smoking status: Never Smoker   . Smokeless tobacco: Never Used  . Alcohol Use: No  . Drug Use: No  . Sexual Activity: Yes   Other Topics Concern  . Not on file   Social History Narrative   ** Merged History Encounter **             Patient Demographics  [name], is a 48 y.o. female  CSN:[id number]  MRN:[id number]  DOB - 07/11/1966  CC:  Chief Complaint  Patient presents with  . Establish Care       HPI: [name] is a 48 y.o. female here today to establish medical care.patient has history of diabetesfor more than 15 years as per patient currently she is taking Lantus 20 units and NovoLog 10 units with each meal, as per patient she does not have a glucometer and has not checked her blood sugar level for the last one year, previously she has history of diabetic foot ulcer,osteomyelitis and amputation of left foot fifth toe, as per patient she has not seen a podiatrist, she also history of cataract, recently have been having blurry vision and is requesting referral to see ophthalmologist. Patient has No headache, No chest pain, No abdominal pain - No Nausea, No new weakness tingling or numbness, No Cough - SOB.  No Known Allergies Past Medical History  Diagnosis Date  . Diabetes mellitus   . 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 Wendy Brewster), Cysto performed by Dr. L. Pterson 12/2009 with evac of clots and bx and fulguration. // H/O stage 2 SCC of the vulva  . Anemia     2/2 blood loss  . Diabetes mellitus type 2, uncontrolled DX: 2001  . 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 Diabetic foot ulcers   . Cataract   . Diabetes mellitus without complication    Current Outpatient Prescriptions on File Prior to Visit  Medication Sig Dispense Refill  . insulin aspart (NOVOLOG) 100 UNIT/ML injection Inject 10 Units into the skin 3 (three) times daily before meals.     . insulin glargine (LANTUS) 100 UNIT/ML injection Inject 20 Units into the skin at bedtime.    .  clindamycin (CLEOCIN) 300 MG capsule Take 1 capsule (300 mg total) by mouth 4 (four) times daily. X 7 days 28 capsule 0  . HYDROcodone-acetaminophen (NORCO) 5-325 MG per tablet Take 1 tablet by mouth every 4 (four) hours as needed for pain. 20 tablet 0  . insulin aspart (NOVOLOG) 100 UNIT/ML injection Inject 3 Units into the skin 3 (three) times daily before meals.    . insulin glargine (LANTUS) 100 UNIT/ML injection Inject 20 Units into the skin at bedtime.     . oxyCODONE-acetaminophen (PERCOCET) 5-325 MG per tablet Take 1-2 tablets by mouth every 6 (six) hours as needed. 15 tablet 0   No current facility-administered medications on file prior to visit.   Family History  Problem Relation Age of Onset  . Diabetes Mother   . Diabetes Father   . Diabetes Brother    History   Social History  . Marital Status: Married    Spouse Name: N/A    Number of Children: N/A  . Years of Education: N/A   Occupational History  . Not on file.   Social History Main Topics  . Smoking status: Never Smoker   . Smokeless tobacco: Never Used  . Alcohol Use: No  . Drug Use: No  . Sexual Activity: Yes   Other Topics Concern  . Not on file   Social History Narrative   ** Merged History Encounter **        

## 2014-12-20 NOTE — Progress Notes (Signed)
Patient here with interpreter Here to establish care Has history of diabetes and currently on insulin

## 2014-12-20 NOTE — Patient Instructions (Signed)
La diabetes mellitus y los alimentos (Diabetes Mellitus and Food) Es importante que controle su nivel de azcar en la sangre (glucosa). El nivel de glucosa en sangre depende en gran medida de lo que usted come. Comer alimentos saludables en las cantidades [place] a lo largo del Training and development officer, aproximadamente a la misma hora [employer], lo ayudar a Chief Technology Officer su nivel de Multimedia programmer. Tambin puede ayudarlo a retrasar o Patent attorney de la diabetes mellitus. Comer de [employer] saludable incluso puede ayudarlo a Chartered loss adjuster de presin arterial y a Science writer o Theatre manager un peso saludable.  CMO PUEDEN AFECTARME LOS ALIMENTOS? Carbohidratos Los carbohidratos afectan el nivel de glucosa en sangre ms que cualquier otro tipo de alimento. El nutricionista lo ayudar a Teacher, adult education cuntos carbohidratos puede consumir en cada comida y ensearle a contarlos. El recuento de carbohidratos es importante para mantener la glucosa en sangre en un nivel saludable, en especial si utiliza insulina o toma determinados medicamentos para la diabetes mellitus. Alcohol El alcohol puede provocar disminuciones sbitas de la glucosa en sangre (hipoglucemia), en especial si utiliza insulina o toma determinados medicamentos para la diabetes mellitus. La hipoglucemia es una afeccin que puede poner en peligro la vida. Los sntomas de la hipoglucemia (somnolencia, mareos y Data processing manager) son similares a los sntomas de haber consumido mucho alcohol.  Si el mdico lo autoriza a beber alcohol, hgalo con moderacin y siga estas pautas:  Las mujeres no deben beber ms de un trago por da, y los hombres no deben beber ms de dos tragos por Training and development officer. Un trago es igual a:  12 onzas (355 ml) de cerveza  5 onzas de vino (150 ml) de vino  1,5onzas (35ml) de bebidas espirituosas  No beba con el estmago vaco.  Mantngase hidratado. Beba agua, gaseosas dietticas o t helado sin azcar.  Las gaseosas comunes, los jugos y  otros refrescos podran contener muchos carbohidratos y se Civil Service fast streamer. QU ALIMENTOS NO SE RECOMIENDAN? Cuando haga las elecciones de alimentos, es importante que recuerde que todos los alimentos son distintos. Algunos tienen menos nutrientes que otros por porcin, aunque podran tener la misma cantidad de caloras o carbohidratos. Es difcil darle al cuerpo lo que necesita cuando consume alimentos con menos nutrientes. Estos son algunos ejemplos de alimentos que debera evitar ya que contienen muchas caloras y carbohidratos, pero pocos nutrientes:  Physicist, medical trans (la mayora de los alimentos procesados incluyen grasas trans en la etiqueta de Informacin nutricional).  Gaseosas comunes.  Jugos.  Caramelos.  Dulces, como tortas, pasteles, rosquillas y [place].  Comidas fritas. QU ALIMENTOS PUEDO COMER? Consuma alimentos ricos en nutrientes, que nutrirn el cuerpo y lo mantendrn saludable. Los alimentos que debe comer tambin dependern de varios factores, como:  Las caloras que necesita.  Los medicamentos que toma.  Su peso.  El nivel de glucosa en [place].  El [place] de presin arterial.  El nivel de colesterol. Tambin debe consumir una variedad de [place], como:  Protenas, como carne, aves, pescado, tofu, frutos secos y semillas (las protenas de [place] magros son mejores).  Lambert Mody.  Verduras.  Productos lcteos, como [place], queso y yogur (descremados son mejores).  Panes, granos, pastas, cereales, arroz y frijoles.  Grasas, como aceite de [place], [place] sin grasas trans, aceite de canola, aguacate y [place]. TODOS LOS QUE PADECEN DIABETES MELLITUS TIENEN EL [place] PLAN DE [place]? Dado que todas las personas que padecen diabetes mellitus son distintas, no hay un solo plan de comidas que funcione para todos. Es muy  importante que se rena con un nutricionista que lo ayudar a crear un plan de comidas adecuado para usted. Document Released: 02/23/2008  Document Revised: 11/21/2013 [facility] Patient Information 2015 [place]. This information is not intended to replace advice given to you by your health care provider. Make sure you discuss any questions you have with your health care provider.

## 2014-12-21 LAB — VITAMIN D 25 HYDROXY (VIT D DEFICIENCY, FRACTURES): Vit D, 25-Hydroxy: 10 ng/mL — ABNORMAL LOW (ref 30–100)

## 2014-12-26 ENCOUNTER — Telehealth: Payer: Self-pay | Admitting: *Deleted

## 2014-12-26 MED ORDER — ATORVASTATIN CALCIUM 20 MG PO TABS: 20.0000 mg | ORAL_TABLET | Freq: Every day | ORAL | Status: DC

## 2014-12-26 MED ORDER — VITAMIN D (ERGOCALCIFEROL) 1.25 MG (50000 UNIT) PO CAPS: 50000.0000 [IU] | ORAL_CAPSULE | ORAL | Status: DC

## 2014-12-26 NOTE — Telephone Encounter (Signed)
Left voice message to return call (Voice message was left in Spanish) Rx was send to [facility]

## 2014-12-26 NOTE — Telephone Encounter (Signed)
-----   Message from Lorayne Marek, MD sent at 12/24/2014 10:27 AM EST ----- Blood work reviewed, noticed low vitamin D, call patient advise to start ergocalciferol 50,000 units once a week for the duration of  12 weeks. Also noticed hyperlipidemia, since patient is diabetic will start on statins, advise patient to start taking Lipitor 20 mg daily.also advise patient for low fat diet.

## 2015-01-14 ENCOUNTER — Ambulatory Visit: Payer: Self-pay

## 2015-01-23 ENCOUNTER — Ambulatory Visit: Payer: MEDICAID | Attending: Internal Medicine

## 2015-03-20 ENCOUNTER — Inpatient Hospital Stay (HOSPITAL_COMMUNITY)
Admission: EM | Admit: 2015-03-20 | Discharge: 2015-03-22 | DRG: 603 | Disposition: A | Discharge status: Home / Self Care | Payer: No Typology Code available for payment source | Attending: Internal Medicine | Admitting: Internal Medicine

## 2015-03-20 ENCOUNTER — Encounter (HOSPITAL_COMMUNITY): Payer: Self-pay | Admitting: Emergency Medicine

## 2015-03-20 ENCOUNTER — Emergency Department (HOSPITAL_COMMUNITY): Payer: No Typology Code available for payment source

## 2015-03-20 DIAGNOSIS — D649 Anemia, unspecified: Secondary | ICD-10-CM | POA: Diagnosis present

## 2015-03-20 DIAGNOSIS — E1065 Type 1 diabetes mellitus with hyperglycemia: Secondary | ICD-10-CM | POA: Diagnosis present

## 2015-03-20 DIAGNOSIS — L97509 Non-pressure chronic ulcer of other part of unspecified foot with unspecified severity: Secondary | ICD-10-CM

## 2015-03-20 DIAGNOSIS — E11621 Type 2 diabetes mellitus with foot ulcer: Secondary | ICD-10-CM | POA: Insufficient documentation

## 2015-03-20 DIAGNOSIS — L03032 Cellulitis of left toe: Principal | ICD-10-CM | POA: Diagnosis present

## 2015-03-20 DIAGNOSIS — L97529 Non-pressure chronic ulcer of other part of left foot with unspecified severity: Secondary | ICD-10-CM | POA: Diagnosis present

## 2015-03-20 DIAGNOSIS — Z8544 Personal history of malignant neoplasm of other female genital organs: Secondary | ICD-10-CM

## 2015-03-20 DIAGNOSIS — Z794 Long term (current) use of insulin: Secondary | ICD-10-CM

## 2015-03-20 DIAGNOSIS — IMO0002 Reserved for concepts with insufficient information to code with codable children: Secondary | ICD-10-CM | POA: Diagnosis present

## 2015-03-20 DIAGNOSIS — R739 Hyperglycemia, unspecified: Secondary | ICD-10-CM

## 2015-03-20 DIAGNOSIS — Z89429 Acquired absence of other toe(s), unspecified side: Secondary | ICD-10-CM

## 2015-03-20 DIAGNOSIS — E118 Type 2 diabetes mellitus with unspecified complications: Secondary | ICD-10-CM

## 2015-03-20 DIAGNOSIS — M869 Osteomyelitis, unspecified: Secondary | ICD-10-CM | POA: Diagnosis present

## 2015-03-20 LAB — CBC WITH DIFFERENTIAL/PLATELET
Basophils Absolute: 0 10*3/uL (ref 0.0–0.1)
Basophils Relative: 0 % (ref 0–1)
EOS ABS: 0.3 10*3/uL (ref 0.0–0.7)
Eosinophils Relative: 6 % — ABNORMAL HIGH (ref 0–5)
HEMATOCRIT: 33.5 % — AB (ref 36.0–46.0)
HEMOGLOBIN: 11.1 g/dL — AB (ref 12.0–15.0)
LYMPHS ABS: 2.1 10*3/uL (ref 0.7–4.0)
LYMPHS PCT: 39 % (ref 12–46)
MCH: 29.8 pg (ref 26.0–34.0)
MCHC: 33.1 g/dL (ref 30.0–36.0)
MCV: 89.8 fL (ref 78.0–100.0)
MONO ABS: 0.3 10*3/uL (ref 0.1–1.0)
MONOS PCT: 6 % (ref 3–12)
Neutro Abs: 2.7 10*3/uL (ref 1.7–7.7)
Neutrophils Relative %: 49 % (ref 43–77)
PLATELETS: 363 10*3/uL (ref 150–400)
RBC: 3.73 MIL/uL — ABNORMAL LOW (ref 3.87–5.11)
RDW: 12.9 % (ref 11.5–15.5)
WBC: 5.4 10*3/uL (ref 4.0–10.5)

## 2015-03-20 LAB — BASIC METABOLIC PANEL
Anion gap: 6 (ref 5–15)
BUN: 21 mg/dL (ref 6–23)
CHLORIDE: 99 mmol/L (ref 96–112)
CO2: 26 mmol/L (ref 19–32)
Calcium: 8.6 mg/dL (ref 8.4–10.5)
Creatinine, Ser: 0.96 mg/dL (ref 0.50–1.10)
GFR calc Af Amer: 79 mL/min — ABNORMAL LOW (ref >90)
GFR, EST NON AFRICAN AMERICAN: 68 mL/min — AB (ref >90)
Glucose, Bld: 626 mg/dL (ref 70–99)
POTASSIUM: 5.2 mmol/L — AB (ref 3.5–5.1)
Sodium: 131 mmol/L — ABNORMAL LOW (ref 135–145)

## 2015-03-20 LAB — CBG MONITORING, ED
GLUCOSE-CAPILLARY: 221 mg/dL — AB (ref 70–99)
Glucose-Capillary: 447 mg/dL — ABNORMAL HIGH (ref 70–99)

## 2015-03-20 LAB — I-STAT CG4 LACTIC ACID, ED: Lactic Acid, Venous: 1.72 mmol/L (ref 0.5–2.0)

## 2015-03-20 MED ORDER — INSULIN ASPART 100 UNIT/ML ~~LOC~~ SOLN
10.0000 [IU] | Freq: Once | SUBCUTANEOUS | Status: AC
  Administered 2015-03-20: 10 [IU] via SUBCUTANEOUS
  Filled 2015-03-20: qty 1

## 2015-03-20 MED ORDER — VANCOMYCIN HCL IN DEXTROSE 750-5 MG/150ML-% IV SOLN
750.0000 mg | Freq: Two times a day (BID) | INTRAVENOUS | Status: DC
Start: 2015-03-20 — End: 2015-03-21
  Administered 2015-03-20 – 2015-03-21 (×2): 750 mg via INTRAVENOUS
  Filled 2015-03-20 (×3): qty 150

## 2015-03-20 MED ORDER — INSULIN GLARGINE 100 UNIT/ML ~~LOC~~ SOLN
20.0000 [IU] | Freq: Once | SUBCUTANEOUS | Status: AC
  Administered 2015-03-20: 20 [IU] via SUBCUTANEOUS
  Filled 2015-03-20: qty 0.2

## 2015-03-20 MED ORDER — SODIUM CHLORIDE 0.9 % IV BOLUS (SEPSIS)
1000.0000 mL | Freq: Once | INTRAVENOUS | Status: AC
  Administered 2015-03-20: 1000 mL via INTRAVENOUS

## 2015-03-20 NOTE — ED Notes (Signed)
Patient a has left posterior foot wound that is circular in shape with tunneling in the center. Denies any drainage. No order. Patient has another wound to anterior 2nd toe with no bleeding or drainage.

## 2015-03-20 NOTE — ED Provider Notes (Signed)
CSN: [id number]     Arrival date & time 03/20/15  1914 History   First MD Initiated Contact with Patient 03/20/15 1919     No chief complaint on file.    (Consider location/radiation/quality/duration/timing/severity/associated sxs/prior Treatment) HPI Comments: Patient presents to the emergency department with chief complaints of left foot pain. She states that she has had the pain for approximately one month. She reports worsening swelling of her foot and second toe. Reports moderate discharge. She states that her blood sugar has been running high for the past couple of days. She denies any fevers or chills. States that the pain is aggravated with movement and palpation. She has a history of diabetes and has had her third toe amputated. She has not taken any thing to alleviate the symptoms.  The history is provided by the patient. No language interpreter was used.    Past Medical History  Diagnosis Date  . Diabetes mellitus   . 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 Janie Morning), Cysto performed by Dr. Jonna Munro 12/2009 with evac of clots and bx and fulguration. // H/O stage 2 SCC of the vulva  . Anemia     2/2 blood loss  . Diabetes mellitus type 2, uncontrolled DX: 2001  . 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 Diabetic foot ulcers   . Cataract   . Diabetes mellitus without complication    Past Surgical History  Procedure Laterality Date  . Radical wide local excision of the vulva and right nguinal lymph node dissection  10/2003    Dr. Fermin Schwab  . Uterine dilatation and currettage    . Labial mass excision  07/2003    Dr. Ree Edman  . Left second toe mtp joint amputation  12/19/2010    Dr. Mayer Camel  . Irrigation and debridement of left foot with removal  of left  01/29/2011    Dr. Mayer Camel  . Tubal ligation     Family History  Problem Relation Age of Onset  . Diabetes Mother   . Diabetes Father   . Diabetes Brother    History  Substance Use Topics  . Smoking status: Never Smoker   . Smokeless tobacco: Never Used  . Alcohol Use: No   OB History    No data available     Review of Systems  Constitutional: Negative for fever and chills.  Respiratory: Negative for shortness of breath.   Cardiovascular: Negative for chest pain.  Gastrointestinal: Negative for nausea, vomiting, diarrhea and constipation.  Genitourinary: Negative for dysuria.  Skin: Positive for wound.  All other systems reviewed and are negative.     Allergies  Review of patient's allergies indicates no known allergies.  Home Medications   Prior to Admission medications   Medication Sig Start Date End Date Taking? Authorizing Provider  insulin aspart (NOVOLOG) 100 UNIT/ML injection Inject 3 Units into the skin 3 (three) times daily before meals.   Yes Historical Provider, MD  insulin glargine (LANTUS) 100 UNIT/ML injection Inject 20 Units into the skin at bedtime.    Yes Historical Provider, MD  atorvastatin (LIPITOR) 20 MG tablet Take 1 tablet (20 mg total) by mouth daily. Patient not taking: Reported on 03/20/2015 12/26/14   Lorayne Marek, MD  clindamycin (CLEOCIN) 300 MG  capsule Take 1 capsule (300 mg total) by mouth 4 (four) times daily. X 7 days Patient not taking: Reported on 03/20/2015 12/20/12   Hazel Sams, PA-C  glucose monitoring kit (FREESTYLE) monitoring kit 1 each by Does not apply route 4 (four) times daily - after meals and at bedtime. 1 month Diabetic Testing Supplies for QAC-QHS accuchecks. Patient not taking: Reported on 03/20/2015 12/20/14   Lorayne Marek, MD  HYDROcodone-acetaminophen (NORCO) 5-325 MG per tablet Take 1 tablet by mouth every 4 (four) hours as needed for pain. Patient not taking: Reported on 03/20/2015 12/20/12   Hazel Sams, PA-C    oxyCODONE-acetaminophen (PERCOCET) 5-325 MG per tablet Take 1-2 tablets by mouth every 6 (six) hours as needed. Patient not taking: Reported on 03/20/2015 07/04/14   Wandra Arthurs, MD  Vitamin D, Ergocalciferol, (DRISDOL) 50000 UNITS CAPS capsule Take 1 capsule (50,000 Units total) by mouth every 7 (seven) days. Patient not taking: Reported on 03/20/2015 12/26/14   Lorayne Marek, MD   BP 118/86 mmHg  Pulse 95  Temp(Src) 98.3 F (36.8 C) (Oral)  Resp 18  SpO2 97%  LMP 05/12/2013 Physical Exam  Constitutional: She is oriented to person, place, and time. She appears well-developed and well-nourished.  HENT:  Head: Normocephalic and atraumatic.  Eyes: Conjunctivae and EOM are normal. Pupils are equal, round, and reactive to light.  Neck: Normal range of motion. Neck supple.  Cardiovascular: Normal rate and regular rhythm.  Exam reveals no gallop and no friction rub.   No murmur heard. Pulmonary/Chest: Effort normal and breath sounds normal. No respiratory distress. She has no wheezes. She has no rales. She exhibits no tenderness.  Abdominal: Soft. Bowel sounds are normal. She exhibits no distension and no mass. There is no tenderness. There is no rebound and no guarding.  Musculoskeletal: Normal range of motion. She exhibits no edema or tenderness.  Neurological: She is alert and oriented to person, place, and time.  Skin: Skin is warm and dry.  Left foot remarkable for ulcer on the sole of foot with some surrounding discharge and mild erythema  Psychiatric: She has a normal mood and affect. Her behavior is normal. Judgment and thought content normal.  Nursing note and vitals reviewed.   ED Course  Procedures (including critical care time) Results for orders placed or performed during the hospital encounter of 03/20/15  CBC with Differential/Platelet  Result Value Ref Range   WBC 5.4 4.0 - 10.5 K/uL   RBC 3.73 (L) 3.87 - 5.11 MIL/uL   Hemoglobin 11.1 (L) 12.0 - 15.0 g/dL   HCT 33.5 (L)  36.0 - 46.0 %   MCV 89.8 78.0 - 100.0 fL   MCH 29.8 26.0 - 34.0 pg   MCHC 33.1 30.0 - 36.0 g/dL   RDW 12.9 11.5 - 15.5 %   Platelets 363 150 - 400 K/uL   Neutrophils Relative % 49 43 - 77 %   Neutro Abs 2.7 1.7 - 7.7 K/uL   Lymphocytes Relative 39 12 - 46 %   Lymphs Abs 2.1 0.7 - 4.0 K/uL   Monocytes Relative 6 3 - 12 %   Monocytes Absolute 0.3 0.1 - 1.0 K/uL   Eosinophils Relative 6 (H) 0 - 5 %   Eosinophils Absolute 0.3 0.0 - 0.7 K/uL   Basophils Relative 0 0 - 1 %   Basophils Absolute 0.0 0.0 - 0.1 K/uL  Basic metabolic panel  Result Value Ref Range   Sodium 131 (L) 135 - 145 mmol/L  Potassium 5.2 (H) 3.5 - 5.1 mmol/L   Chloride 99 96 - 112 mmol/L   CO2 26 19 - 32 mmol/L   Glucose, Bld 626 (HH) 70 - 99 mg/dL   BUN 21 6 - 23 mg/dL   Creatinine, Ser 0.96 0.50 - 1.10 mg/dL   Calcium 8.6 8.4 - 10.5 mg/dL   GFR calc non Af Amer 68 (L) >90 mL/min   GFR calc Af Amer 79 (L) >90 mL/min   Anion gap 6 5 - 15  CBG monitoring, ED  Result Value Ref Range   Glucose-Capillary 447 (H) 70 - 99 mg/dL   Comment 1 Notify RN    Dg Foot Complete Left  03/20/2015   CLINICAL DATA:  Toe infection with ulcer on the midfoot.  EXAM: LEFT FOOT - COMPLETE 3+ VIEW  COMPARISON:  01/02/2014  FINDINGS: There is an ulcer to the plantar forefoot at the MTP level with soft tissue gas in the third and fourth digits. These digits are displaced laterally by soft tissue swelling. There is no evidence of septic arthritis or acute osteomyelitis.  Remote transmetatarsal second toe amputation.  Ossicle or remote fracture to the medial malleolus.  IMPRESSION: Plantar forefoot ulcer with soft tissue gas in the third and fourth digits. No evidence of osteomyelitis.   Electronically Signed   By: Monte Fantasia M.D.   On: 03/20/2015 21:19      EKG Interpretation None          MDM   Final diagnoses:  Diabetic foot  Hyperglycemia   Patient with left foot ulcer, hyperglycemia, prior amputation, and worsening  pain. Will check labs, will reassess.  Blood sugars greater than 600. Will give fluids and insulin. Will check x-ray foot. Patient is afebrile. There is some mild discharge around the ulceration.   Given the soft tissue gas in the approximate location of the most erythematous area of the third toe, suspicious for diabetic foot infection. Patient discussed with Dr. Regenia Skeeter, who advises that we bring the patient and give IV antibiotics. CBG is also greater than 600. Will give insulin and fluids.   Glucose 626.  CRITICAL CARE Performed by: Montine Circle   Total critical care time: 35  Critical care time was exclusive of separately billable procedures and treating other patients.  Critical care was necessary to treat or prevent imminent or life-threatening deterioration.  Critical care was time spent personally by me on the following activities: development of treatment plan with patient and/or surrogate as well as nursing, discussions with consultants, evaluation of patient's response to treatment, examination of patient, obtaining history from patient or surrogate, ordering and performing treatments and interventions, ordering and review of laboratory studies, ordering and review of radiographic studies, pulse oximetry and re-evaluation of patient's condition.    11:23 PM Spoke with Dr. Ronnie Derby of orthopedics, who will see the patient first thing in the morning.  Montine Circle, PA-C 03/20/15 0175  Sherwood Gambler, MD 03/23/15 [phone]

## 2015-03-20 NOTE — ED Notes (Signed)
Hospitalist at bedside 

## 2015-03-20 NOTE — Progress Notes (Signed)
ANTIBIOTIC CONSULT NOTE - INITIAL  Pharmacy Consult for Vancomycin Indication: Wound infection  No Known Allergies  Patient Measurements: Wt=56 kg  Vital Signs: Temp: 97.9 F (36.6 C) (04/20 2238) Temp Source: Oral (04/20 2238) BP: 121/66 mmHg (04/20 2238) Pulse Rate: 89 (04/20 2238) Intake/Output from previous day:   Intake/Output from this shift:    Labs:  Recent Labs  03/20/15 2000  WBC 5.4  HGB 11.1*  PLT 363  CREATININE 0.96   CrCl cannot be calculated (Unknown ideal weight.). No results for input(s): VANCOTROUGH, VANCOPEAK, VANCORANDOM, GENTTROUGH, GENTPEAK, GENTRANDOM, TOBRATROUGH, TOBRAPEAK, TOBRARND, AMIKACINPEAK, AMIKACINTROU, AMIKACIN in the last 72 hours.   Microbiology: No results found for this or any previous visit (from the past 720 hour(s)).  Medical History: Past Medical History  Diagnosis Date  . Diabetes mellitus   . 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 Janie Morning), Cysto performed by Dr. Jonna Munro 12/2009 with evac of clots and bx and fulguration. // H/O stage 2 SCC of the vulva  . Anemia     2/2 blood loss  . Diabetes mellitus type 2, uncontrolled DX: 2001  . 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 Diabetic foot ulcers   . Cataract   . Diabetes mellitus without complication     Medications:   (Not in a hospital admission) Scheduled:  . vancomycin  750 mg Intravenous Q12H   Infusions:   Assessment: 11 yoF with foot and 2nd toe wound.  Vancomycin per Rx for wound infection.  Goal of Therapy:  Vancomycin trough level 15-20 mcg/ml  Plan:   Vancomycin 750mg  IV q12h  F/u SCr/cultures/levels as needed  Dorrene German 03/20/2015,11:31 PM

## 2015-03-20 NOTE — ED Notes (Signed)
Patient c/o left foot pain x1 month. Patient has left third toe amputated with swelling and redness to second toe. Patient also has wound on bottom of left foot x1 month.

## 2015-03-20 NOTE — ED Provider Notes (Signed)
MSE was initiated and I personally evaluated the patient and placed orders (if any) at  7:46 PM on March 20, 2015.  The patient appears stable so that the remainder of the MSE may be completed by another provider.  Patient with history of diabetes presents with approximately one-month history of left foot pain, deformity and swelling of her second toe. Previous amputation of the third toe. Patient also has worsening ulcer on the sole of her foot. There is drainage noted on the bandages. Patient denies fever.  Carlisle Cater, PA-C 03/20/15 1947  Dorie Rank, MD 03/21/15 [phone]

## 2015-03-21 ENCOUNTER — Encounter (HOSPITAL_COMMUNITY): Payer: Self-pay | Admitting: Internal Medicine

## 2015-03-21 DIAGNOSIS — E1065 Type 1 diabetes mellitus with hyperglycemia: Secondary | ICD-10-CM

## 2015-03-21 DIAGNOSIS — IMO0002 Reserved for concepts with insufficient information to code with codable children: Secondary | ICD-10-CM | POA: Diagnosis present

## 2015-03-21 DIAGNOSIS — L03032 Cellulitis of left toe: Principal | ICD-10-CM

## 2015-03-21 LAB — COMPREHENSIVE METABOLIC PANEL
ALBUMIN: 3.4 g/dL — AB (ref 3.5–5.2)
ALK PHOS: 128 U/L — AB (ref 39–117)
ALT: 16 U/L (ref 0–35)
AST: 15 U/L (ref 0–37)
Anion gap: 5 (ref 5–15)
BUN: 22 mg/dL (ref 6–23)
CO2: 26 mmol/L (ref 19–32)
Calcium: 8.3 mg/dL — ABNORMAL LOW (ref 8.4–10.5)
Chloride: 103 mmol/L (ref 96–112)
Creatinine, Ser: 0.61 mg/dL (ref 0.50–1.10)
GFR calc Af Amer: >90 mL/min (ref >90)
GFR calc non Af Amer: >90 mL/min (ref >90)
Glucose, Bld: 257 mg/dL — ABNORMAL HIGH (ref 70–99)
POTASSIUM: 4.1 mmol/L (ref 3.5–5.1)
Sodium: 134 mmol/L — ABNORMAL LOW (ref 135–145)
TOTAL PROTEIN: 6.8 g/dL (ref 6.0–8.3)
Total Bilirubin: 0.3 mg/dL (ref 0.3–1.2)

## 2015-03-21 LAB — CBC WITH DIFFERENTIAL/PLATELET
Basophils Absolute: 0 10*3/uL (ref 0.0–0.1)
Basophils Relative: 1 % (ref 0–1)
Eosinophils Absolute: 0.4 10*3/uL (ref 0.0–0.7)
Eosinophils Relative: 6 % — ABNORMAL HIGH (ref 0–5)
HEMATOCRIT: 32.2 % — AB (ref 36.0–46.0)
HEMOGLOBIN: 10.5 g/dL — AB (ref 12.0–15.0)
LYMPHS ABS: 2.5 10*3/uL (ref 0.7–4.0)
LYMPHS PCT: 39 % (ref 12–46)
MCH: 29 pg (ref 26.0–34.0)
MCHC: 32.6 g/dL (ref 30.0–36.0)
MCV: 89 fL (ref 78.0–100.0)
MONO ABS: 0.6 10*3/uL (ref 0.1–1.0)
Monocytes Relative: 9 % (ref 3–12)
Neutro Abs: 2.9 10*3/uL (ref 1.7–7.7)
Neutrophils Relative %: 45 % (ref 43–77)
Platelets: 303 10*3/uL (ref 150–400)
RBC: 3.62 MIL/uL — AB (ref 3.87–5.11)
RDW: 12.7 % (ref 11.5–15.5)
WBC: 6.5 10*3/uL (ref 4.0–10.5)

## 2015-03-21 LAB — RETICULOCYTES
RBC.: 3.86 MIL/uL — AB (ref 3.87–5.11)
RETIC CT PCT: 1.6 % (ref 0.4–3.1)
Retic Count, Absolute: 61.8 10*3/uL (ref 19.0–186.0)

## 2015-03-21 LAB — FOLATE: Folate: 5.9 ng/mL

## 2015-03-21 LAB — GLUCOSE, CAPILLARY
GLUCOSE-CAPILLARY: 138 mg/dL — AB (ref 70–99)
GLUCOSE-CAPILLARY: 339 mg/dL — AB (ref 70–99)
GLUCOSE-CAPILLARY: 377 mg/dL — AB (ref 70–99)
Glucose-Capillary: 280 mg/dL — ABNORMAL HIGH (ref 70–99)
Glucose-Capillary: 242 mg/dL — ABNORMAL HIGH (ref 70–99)

## 2015-03-21 LAB — IRON AND TIBC
IRON: 55 ug/dL (ref 42–145)
Saturation Ratios: 20 % (ref 20–55)
TIBC: 278 ug/dL (ref 250–470)
UIBC: 223 ug/dL (ref 125–400)

## 2015-03-21 LAB — FERRITIN: FERRITIN: 186 ng/mL (ref 10–291)

## 2015-03-21 LAB — SEDIMENTATION RATE: Sed Rate: 38 mm/hr — ABNORMAL HIGH (ref 0–22)

## 2015-03-21 LAB — VITAMIN B12: VITAMIN B 12: 603 pg/mL (ref 211–911)

## 2015-03-21 MED ORDER — DOXYCYCLINE HYCLATE 100 MG PO TABS
100.0000 mg | ORAL_TABLET | Freq: Two times a day (BID) | ORAL | Status: DC
  Administered 2015-03-21 – 2015-03-22 (×3): 100 mg via ORAL
  Filled 2015-03-21 (×4): qty 1

## 2015-03-21 MED ORDER — INSULIN GLARGINE 100 UNIT/ML ~~LOC~~ SOLN
24.0000 [IU] | Freq: Every day | SUBCUTANEOUS | Status: DC
  Administered 2015-03-21: 24 [IU] via SUBCUTANEOUS
  Filled 2015-03-21: qty 0.24

## 2015-03-21 MED ORDER — INSULIN ASPART 100 UNIT/ML ~~LOC~~ SOLN
0.0000 [IU] | Freq: Every day | SUBCUTANEOUS | Status: DC
  Administered 2015-03-21: 2 [IU] via SUBCUTANEOUS

## 2015-03-21 MED ORDER — INSULIN ASPART 100 UNIT/ML ~~LOC~~ SOLN
0.0000 [IU] | Freq: Three times a day (TID) | SUBCUTANEOUS | Status: DC
  Administered 2015-03-21: 15 [IU] via SUBCUTANEOUS
  Administered 2015-03-22: 8 [IU] via SUBCUTANEOUS
  Administered 2015-03-22: 5 [IU] via SUBCUTANEOUS

## 2015-03-21 MED ORDER — ONDANSETRON HCL 4 MG/2ML IJ SOLN
4.0000 mg | Freq: Four times a day (QID) | INTRAMUSCULAR | Status: DC | PRN
  Administered 2015-03-21: 4 mg via INTRAVENOUS
  Filled 2015-03-21: qty 2

## 2015-03-21 MED ORDER — ONDANSETRON HCL 4 MG PO TABS: 4.0000 mg | ORAL_TABLET | Freq: Four times a day (QID) | ORAL | Status: DC | PRN

## 2015-03-21 MED ORDER — INSULIN GLARGINE 100 UNIT/ML ~~LOC~~ SOLN: 20.0000 [IU] | Freq: Every day | SUBCUTANEOUS | Status: DC

## 2015-03-21 MED ORDER — SODIUM CHLORIDE 0.9 % IV SOLN
INTRAVENOUS | Status: AC
  Administered 2015-03-21: 13:00:00 via INTRAVENOUS

## 2015-03-21 MED ORDER — ACETAMINOPHEN 325 MG PO TABS: 650.0000 mg | ORAL_TABLET | Freq: Four times a day (QID) | ORAL | Status: DC | PRN

## 2015-03-21 MED ORDER — INSULIN ASPART 100 UNIT/ML ~~LOC~~ SOLN
0.0000 [IU] | Freq: Three times a day (TID) | SUBCUTANEOUS | Status: DC
  Administered 2015-03-21: 7 [IU] via SUBCUTANEOUS

## 2015-03-21 MED ORDER — ACETAMINOPHEN 650 MG RE SUPP: 650.0000 mg | Freq: Four times a day (QID) | RECTAL | Status: DC | PRN

## 2015-03-21 NOTE — Progress Notes (Signed)
Upon meeting patient and trying to discuss care and diabetic instructions, it became clear that the patient understood very little of what I was saying. The patient is primarily Spanish-speaking and knows some basic English words. With the help of interpreter services, I was able to complete the admission history, obtain a current height and weight, discuss physician instructions concerning wound care, causes of wound, and weight distribution, and also educate on diabetes management and good food choices. The patient appeared very closed-off, slightly irritated, and utilized the opportunity to verbalize frustration with the interpreter. She expressed frustration with instructions she was given. Said she lives alone and has no one to help her cook and clean and said, "I have to walk, it's not my fault the physician did a wrong surgery."  I educated the patient on the cause of the foot ulcer being the uncontrolled diabetes, and explained that no surgery was needed at this time because no active infection/sepsis is present (based on ortho MD note). Patient was educated on how to redistribute weight, the dressing change that I was about to do, and the plan of care for her during her stay. I'm not sure that most of her encounters during the visit have been fully understood. Would recommend that all education be done via interpreter services. Patient had and used the opportunity to ask questions and patient expressed gratitude and has no further questions at this time. Completed dressing change per wound-RN recommendations.

## 2015-03-21 NOTE — Progress Notes (Signed)
Patient admitted after midnight, seen and evaluated this morning.  - she has poorly controlled DM, she is non compliant with her diet - orthopedic surgery consulted, will start Doxycycline and she will be followed up as an outpatient in Dr. Jess Barters office - still elevated CBGs today, remain inpatient, increase SSI to moderate, add HS coverage - anticipate d/c tomorrow if afebrile  [name] M. Cruzita Lederer, MD [facility] [phone]

## 2015-03-21 NOTE — Consult Note (Signed)
ORTHOPAEDIC CONSULTATION  REQUESTING PHYSICIAN: Costin Karlyne Greenspan, MD  Chief Complaint: Left foot wound  HPI: [name] is a 49 y.o. female who has left foot plantar wound under 3rd MT head.  No drainage.  Nonseptic appearing.  Poorly controlled DM.  Ortho consulted.  Past Medical History  Diagnosis Date  . Diabetes mellitus   . 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 Janie Morning), Cysto performed by Dr. Jonna Munro 12/2009 with evac of clots and bx and fulguration. // H/O stage 2 SCC of the vulva  . Anemia     2/2 blood loss  . Diabetes mellitus type 2, uncontrolled DX: 2001  . 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 Diabetic foot ulcers   . Cataract   . Diabetes mellitus without complication    Past Surgical History  Procedure Laterality Date  . Radical wide local excision of the vulva and right nguinal lymph node dissection  10/2003    Dr. Fermin Schwab  . Uterine dilatation and currettage    . Labial mass excision  07/2003    Dr. Ree Edman  . Left second toe mtp joint amputation  12/19/2010    Dr. Mayer Camel  . Irrigation and debridement of left foot with removal of left  01/29/2011    Dr. Mayer Camel  . Tubal ligation     History   Social History  . Marital Status: Married    Spouse Name: N/A  . Number of Children: N/A  . Years of Education: N/A   Social History Main Topics  . Smoking status: Never Smoker   . Smokeless tobacco: Never Used  . Alcohol Use: No  . Drug Use: No  . Sexual Activity: Yes   Other Topics Concern  . None   Social History Narrative   ** Merged History Encounter **       Family History  Problem Relation Age of Onset  . Diabetes Mother   . Diabetes Father   . Diabetes Brother    No Known Allergies Prior to  Admission medications   Medication Sig Start Date End Date Taking? Authorizing Provider  insulin aspart (NOVOLOG) 100 UNIT/ML injection Inject 3 Units into the skin 3 (three) times daily before meals.   Yes Historical Provider, MD  insulin glargine (LANTUS) 100 UNIT/ML injection Inject 20 Units into the skin at bedtime.    Yes Historical Provider, MD  atorvastatin (LIPITOR) 20 MG tablet Take 1 tablet (20 mg total) by mouth daily. Patient not taking: Reported on 03/20/2015 12/26/14   Lorayne Marek, MD  clindamycin (CLEOCIN) 300 MG capsule Take 1 capsule (300 mg total) by mouth 4 (four) times daily. X 7 days Patient not taking: Reported on 03/20/2015 12/20/12   Hazel Sams, PA-C  glucose monitoring kit (FREESTYLE) monitoring kit 1 each by Does not apply route 4 (four) times daily - after meals and at bedtime. 1 month Diabetic Testing Supplies for QAC-QHS accuchecks. Patient not taking: Reported on 03/20/2015 12/20/14   Lorayne Marek, MD  HYDROcodone-acetaminophen (NORCO) 5-325 MG per tablet Take 1 tablet by mouth every 4 (four) hours as needed for pain. Patient not taking: Reported on 03/20/2015 12/20/12   Hazel Sams, PA-C  oxyCODONE-acetaminophen (PERCOCET) 5-325 MG per tablet Take 1-2 tablets by mouth every 6 (  six) hours as needed. Patient not taking: Reported on 03/20/2015 07/04/14   Wandra Arthurs, MD  Vitamin D, Ergocalciferol, (DRISDOL) 50000 UNITS CAPS capsule Take 1 capsule (50,000 Units total) by mouth every 7 (seven) days. Patient not taking: Reported on 03/20/2015 12/26/14   Lorayne Marek, MD   Dg Foot Complete Left  03/20/2015   CLINICAL DATA:  Toe infection with ulcer on the midfoot.  EXAM: LEFT FOOT - COMPLETE 3+ VIEW  COMPARISON:  01/02/2014  FINDINGS: There is an ulcer to the plantar forefoot at the MTP level with soft tissue gas in the third and fourth digits. These digits are displaced laterally by soft tissue swelling. There is no evidence of septic arthritis or acute osteomyelitis.  Remote  transmetatarsal second toe amputation.  Ossicle or remote fracture to the medial malleolus.  IMPRESSION: Plantar forefoot ulcer with soft tissue gas in the third and fourth digits. No evidence of osteomyelitis.   Electronically Signed   By: Monte Fantasia M.D.   On: 03/20/2015 21:19    Positive ROS: All other systems have been reviewed and were otherwise negative with the exception of those mentioned in the HPI and as above.  Physical Exam: General: Alert, no acute distress Cardiovascular: No pedal edema Respiratory: No cyanosis, no use of accessory musculature GI: No organomegaly, abdomen is soft and non-tender Skin: No lesions in the area of chief complaint Neurologic: Sensation decreased distally Psychiatric: Patient is competent for consent with normal mood and affect Lymphatic: No axillary or cervical lymphadenopathy  MUSCULOSKELETAL:  - plantar wound that does not probe to bone - no drainage - soft tissue swelling, no fluctuance, no induration, no cellulitis  Assessment: Left foot wound  Plan: - non weight bearing to forefoot - doxy 100 BID  - f/u Dr. Sharol Given in office in 1 week - patient nonseptic  Thank you for the consult and the opportunity to see Ms. Creighton  N. Eduard Roux, MD [place] 11:23 AM

## 2015-03-21 NOTE — Consult Note (Signed)
WOC wound consult note Reason for Consult: left foot wound.  Pt with history of left foot wound for many years. She has not treated it at home. She does not even cover it.  She has been admitted with increasing swelling and pain. She is pending ortho consult and has been seen by the [facility] team multiple times for the same.  The area of concern now has extended to the 3rd toe and web space  Wound type:neuropathic foot ulcer Measurement: area of concern is 4cm x 2cm over the 3rd toe, web space and plantar surface, central opening on the plantar surface is 1.0cm x 0.3cm  X0.5cm  Wound bed: mushy central opening, no drainage with probing, does not probe towards affected toes.  Drainage (amount, consistency, odor) minimal, serosanguinous , not able to express drainage with palpation  Periwound: erythema,surperfical skin peeling  Dressing procedure/placement/frequency: I have added packing strip orders however will not start this until after ortho evaluation.  Discussed POC with patient and bedside nurse.  Re consult if needed, will not follow at this time. Thanks  Mihail Prettyman [place], [place] [phone])

## 2015-03-21 NOTE — Progress Notes (Signed)
Inpatient Diabetes Program Recommendations  AACE/ADA: New Consensus Statement on Inpatient Glycemic Control (2013)  Target Ranges:  Prepandial:   less than 140 mg/dL      Peak postprandial:   less than 180 mg/dL (1-2 hours)      Critically ill patients:  140 - 180 mg/dL   Reason for Visit: Hyperglycemia  Diabetes history: DM2 Outpatient Diabetes medications: Lantus 20 units QHS, Novolog 3 units tidwc Current orders for Inpatient glycemic control: Lantus 20 units QHS, Novolog sensitive tidwc  Results for DANIKAH, BUDZIK (MRN [id number]) as of 03/21/2015 13:17  Ref. Range 03/20/2015 22:08 03/20/2015 23:35 03/21/2015 00:59 03/21/2015 07:50 03/21/2015 13:13  Glucose-Capillary Latest Ref Range: 70-99 mg/dL 447 (H) 221 (H) 138 (H) 280 (H) 339 (H)    Inpatient Diabetes Program Recommendations Insulin - Basal: Increase Lantus to 25 units QHS Correction (SSI): Increase Novolog to moderate tidwc and hs Insulin - Meal Coverage: If post-prandial blood sugars > 180 mg/dL, add meal coverage insulin - Novolog 3 units tidwc HgbA1C: Last HgbA1C in Jan 2016 - 13.0% - Need updated HgbA1C which is pending  Note: Will f/u with HgbA1C in am.  Thank you. Lorenda Peck, RD, LDN, CDE Inpatient Diabetes Coordinator [phone]

## 2015-03-21 NOTE — H&P (Signed)
[facility] History and Physical  [name] WGN:[id number] DOB: Apr 02, 1966 DOA: 03/20/2015  Referring physician: Roxy Horseman. PA. PCP: Doris Cheadle, MD   Chief Complaint: Left foot toe swelling and redness.  Spanish interpreter used.  HPI: [name] is a 49 y.o. 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 Patient has been having these symptoms over the last 1 month. Patient also has a chronic nonhealing ulcer on the plantar aspect of the left foot. Patient states that the left foot also bleeds off and on for many months now. Denies any fever or chills. In the ER x-rays reveal gas but no features of osteomyelitis. Patient is not toxic looking and afebrile. On call orthopedic surgeon Dr. Claudie Leach was consulted by ER PA. Patient will be admitted for further management of patient's cellulitis off patient's third toe on the left foot with possible osteomyelitis. In addition patient's blood sugars found to be in 600. Patient states she has been compliant with her medications.  Review of Systems: As presented in the history of presenting illness, rest negative.  Past Medical History  Diagnosis Date  . Diabetes mellitus   . 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 Laurette Schimke), Cysto performed by Dr. Katherine Roan 12/2009 with evac of clots and bx and fulguration. // H/O stage 2 SCC of the vulva  . Anemia     2/2 blood loss  . Diabetes mellitus type 2, uncontrolled DX: 2001  . 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 Diabetic foot ulcers   . Cataract    . Diabetes mellitus without complication    Past Surgical History  Procedure Laterality Date  . Radical wide local excision of the vulva and right nguinal lymph node dissection  10/2003    Dr. Stanford Breed  . Uterine dilatation and currettage    . Labial mass excision  07/2003    Dr. Elana Alm  . Left second toe mtp joint amputation  12/19/2010    Dr. Turner Daniels  . Irrigation and debridement of left foot with removal of left  01/29/2011    Dr. Turner Daniels  . Tubal ligation     Social History:  reports that she has never smoked. She has never used smokeless tobacco. She reports that she does not drink alcohol or use illicit drugs. Where does patient live home. Can patient participate in ADLs? Yes.  No Known Allergies  Family History:  Family History  Problem Relation Age of Onset  . Diabetes Mother   . Diabetes Father   . Diabetes Brother       Prior to Admission medications   Medication Sig Start Date End Date Taking? Authorizing Provider  insulin aspart (NOVOLOG) 100 UNIT/ML injection Inject 3 Units into the skin 3 (three) times daily before meals.   Yes Historical Provider, MD  insulin glargine (LANTUS) 100 UNIT/ML injection Inject 20 Units into the skin at bedtime.    Yes Historical Provider, MD  atorvastatin (LIPITOR) 20 MG tablet Take 1 tablet (20 mg total) by mouth daily. Patient not taking: Reported on 03/20/2015 12/26/14   Doris Cheadle, MD  clindamycin (CLEOCIN) 300 MG capsule Take 1 capsule (300 mg total) by mouth 4 (four) times daily. X 7 days Patient not taking: Reported on 03/20/2015 12/20/12   Ivonne Andrew, PA-C  glucose monitoring kit (FREESTYLE) monitoring kit 1 each by Does not apply route 4 (four) times daily - after meals and at bedtime. 1 month Diabetic Testing Supplies for QAC-QHS accuchecks. Patient not taking: Reported on 03/20/2015 12/20/14   Doris Cheadle, MD  HYDROcodone-acetaminophen (NORCO) 5-325 MG per tablet Take 1 tablet by mouth every 4 (four) hours as needed for  pain. Patient not taking: Reported on 03/20/2015 12/20/12   Ivonne Andrew, PA-C  oxyCODONE-acetaminophen (PERCOCET) 5-325 MG per tablet Take 1-2 tablets by mouth every 6 (six) hours as needed. Patient not taking: Reported on 03/20/2015 07/04/14   Richardean Canal, MD  Vitamin D, Ergocalciferol, (DRISDOL) 50000 UNITS CAPS capsule Take 1 capsule (50,000 Units total) by mouth every 7 (seven) days. Patient not taking: Reported on 03/20/2015 12/26/14   Doris Cheadle, MD    Physical Exam: Filed Vitals:   03/20/15 1931 03/20/15 2238  BP: 118/86 121/66  Pulse: 95 89  Temp: 98.3 F (36.8 C) 97.9 F (36.6 C)  TempSrc: Oral Oral  Resp: 18 18  SpO2: 97% 98%     General: Moderately built and nourished.  Eyes: Anicteric. No pallor.  ENT: No discharge from the ears eyes nose or mouth.  Neck: No mass felt. No JVD appreciated.  Cardiovascular: S1 and S2 heard.  Respiratory: No rhonchi or crepitations.  Abdomen: Soft nontender bowel sounds present.  Skin: Skin on the left third toe is erythematous and the toe looks disfigured and swollen. There is no ulcer on the plantar aspect of foot which patient states is chronic and has mild discharge around it.  Musculoskeletal: See skin section.  Psychiatric: Appears normal.  Neurologic: Alert awake oriented to time place and person. Moves all extremities.  Labs on Admission:  Basic Metabolic Panel:  Recent Labs Lab 03/20/15 2000  NA 131*  K 5.2*  CL 99  CO2 26  GLUCOSE 626*  BUN 21  CREATININE 0.96  CALCIUM 8.6   Liver Function Tests: No results for input(s): AST, ALT, ALKPHOS, BILITOT, PROT, ALBUMIN in the last 168 hours. No results for input(s): LIPASE, AMYLASE in the last 168 hours. No results for input(s): AMMONIA in the last 168 hours. CBC:  Recent Labs Lab 03/20/15 2000  WBC 5.4  NEUTROABS 2.7  HGB 11.1*  HCT 33.5*  MCV 89.8  PLT 363   Cardiac Enzymes: No results for input(s): CKTOTAL, CKMB, CKMBINDEX, TROPONINI in the last  168 hours.  BNP (last 3 results) No results for input(s): BNP in the last 8760 hours.  ProBNP (last 3 results) No results for input(s): PROBNP in the last 8760 hours.  CBG:  Recent Labs Lab 03/20/15 2208 03/20/15 2335  GLUCAP 447* 221*    Radiological Exams on Admission: Dg Foot Complete Left  03/20/2015   CLINICAL DATA:  Toe infection with ulcer on the midfoot.  EXAM: LEFT FOOT - COMPLETE 3+ VIEW  COMPARISON:  01/02/2014  FINDINGS: There is an ulcer to the plantar forefoot at the MTP level with soft tissue gas in the third and fourth digits. These digits are displaced laterally by soft tissue swelling. There is no evidence of septic arthritis or acute osteomyelitis.  Remote transmetatarsal second toe amputation.  Ossicle or remote fracture to the medial malleolus.  IMPRESSION: Plantar forefoot ulcer with soft tissue gas in the  third and fourth digits. No evidence of osteomyelitis.   Electronically Signed   By: Marnee Spring M.D.   On: 03/20/2015 21:19    Assessment/Plan Principal Problem:   Cellulitis of toe of left foot Active Problems:   Diabetes mellitus type 1, uncontrolled   1. Cellulitis of the left foot toe and chronic nonhealing foot ulcer - patient also has possible osteomyelitis of the toe. Check sedimentation rate. And if elevated may need MRI to rule out osteomyelitis. X-rays do reveal gas at this time the patient does not look septic. On-call orthopedic surgeon Dr. Berton Lan has been consulted. Patient has been placed on empty can antibiotics. Wound team consult. 2. Uncontrolled diabetes mellitus type 2 - patient blood sugars have been more than 600 but not in DKA. At this time patient was given NovoLog 10 units in the ER and patient's home dose of Lantus insulin 20 units with IV fluids. Closely follow CBGs with sliding scale coverage. Check hemoglobin A1c. 3. Chronic anemia normocytic normochromic - check anemia panel. Follow CBC. 4. History of vaginal cancer.  I have  personally reviewed patient's old charts and also reviewed patient's x-rays myself.  DVT Prophylaxis SCDs, may change to Lovenox if no surgery anticipated.  Code Status: Full code.  Family Communication: None.  Disposition Plan: Admit to inpatient. Likely stay would be 2-3 days.    [name] N. [facility] Pager [phone].  If 7PM-7AM, please contact night-coverage [email or web address] Password [facility] 03/21/2015, 12:21 AM

## 2015-03-22 LAB — GLUCOSE, CAPILLARY
GLUCOSE-CAPILLARY: 240 mg/dL — AB (ref 70–99)
Glucose-Capillary: 280 mg/dL — ABNORMAL HIGH (ref 70–99)

## 2015-03-22 LAB — HEMOGLOBIN A1C
Hgb A1c MFr Bld: 12.6 % — ABNORMAL HIGH (ref 4.8–5.6)
Mean Plasma Glucose: 315 mg/dL

## 2015-03-22 MED ORDER — INSULIN GLARGINE 100 UNIT/ML ~~LOC~~ SOLN: 24.0000 [IU] | Freq: Every day | SUBCUTANEOUS | Status: DC

## 2015-03-22 MED ORDER — INSULIN ASPART 100 UNIT/ML ~~LOC~~ SOLN: 3.0000 [IU] | Freq: Three times a day (TID) | SUBCUTANEOUS | Status: DC

## 2015-03-22 MED ORDER — DOXYCYCLINE HYCLATE 100 MG PO TABS: 100.0000 mg | ORAL_TABLET | Freq: Two times a day (BID) | ORAL | Status: DC

## 2015-03-22 MED ORDER — RELION CONFIRM GLUCOSE MONITOR W/DEVICE KIT: 1.0000 | PACK | Freq: Three times a day (TID) | Status: DC

## 2015-03-22 MED ORDER — LIVING WELL WITH DIABETES BOOK - IN SPANISH
Freq: Once | Status: DC
  Administered 2015-03-22: 10:00:00
  Filled 2015-03-22: qty 1

## 2015-03-22 NOTE — Discharge Summary (Signed)
Physician Discharge Summary  Khaliah Barnick YDX:[id number] DOB: 1966/03/07 DOA: 03/20/2015  PCP: Lorayne Marek, MD  Admit date: 03/20/2015 Discharge date: 03/22/2015  Time spent: > 30 minutes  Recommendations for Outpatient Follow-up:  1. Follow up with Dr. Annitta Needs in 1 week 2. Follow up with Dr. Sharol Given in 1 week 3. Continue Doxycycline for 10 additional days   Discharge Diagnoses:  Principal Problem:   Cellulitis of toe of left foot Active Problems:   Diabetes mellitus type 1, uncontrolled  Discharge Condition: stable  Diet recommendation: diabetic  Filed Weights   03/21/15 1700  Weight: 60.328 kg (133 lb)   History of present illness:  [name] is a 49 y.o. 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 Patient has been having these symptoms over the last 1 month. Patient also has a chronic nonhealing ulcer on the plantar aspect of the left foot. Patient states that the left foot also bleeds off and on for many months now. Denies any fever or chills. In the ER x-rays reveal gas but no features of osteomyelitis. Patient is not toxic looking and afebrile. On call orthopedic surgeon Dr. Sharion Dove was consulted by ER PA. Patient will be admitted for further management of patient's cellulitis off patient's third toe on the left foot with possible osteomyelitis. In addition patient's blood sugars found to be in 600. Patient states she has been compliant with her medications.  Hospital Course:  Patient was admitted to the [facility] floor with concerns for cellulitis of the left third toe. She was initially placed on broad-spectrum IV antibiotics, and orthopedic surgery was consulted for further evaluation. She was seen by Dr. Erlinda Hong recommended oral antibiotics for now, no indications  for emergent surgery, and Dr. Sharol Given will follow patient in his office in about one week. She was prescribed 10 days of doxycycline on discharge. Patient has poor insight into the etiology of her cellulitis, recurrent toe infections, she is status post amputation 4 years ago, and she is under the impression that that was a failed surgery and that's why she is getting recurrent infections. Patient has poorly controlled diabetes with a hemoglobin A1c of 12.6, and has little to no insight into her condition. I discussed extensively with her via Spanish interpreter on discharge day that is of utmost importance that she takes her insulin as prescribed, and checks her CBGs at least 3-4 times daily and regularly follows up with her primary MD for insulin adjustments. She has poor insight into her diet, and was seen drinking regular soft drinks in her room. She tells me that she hasn't been checking her CBGs "in a while" because her glucometer is not working. She did not call her PCP or try to get her new glucometer. Case management was consulted to assist with her home medications, and a prescription for a glucometer was given to her.  Procedures:  None    Consultations:  Orthopedic surgery   Discharge Exam: Filed Vitals:   03/21/15 1352 03/21/15 1700 03/21/15 2100 03/22/15 0526  BP: 99/55  113/56 108/52  Pulse: 81  88 74  Temp: 97.9 F (36.6 C)  97.6 F (36.4 C) 98.1 F (36.7 C)  TempSrc: Oral  Oral Oral  Resp: 18  16 16   Height:  5' (1.524 m)    Weight:  60.328 kg (133  lb)    SpO2: 98%  99% 99%    General: NAD Cardiovascular: RRR Respiratory: CTA biL Abdomen: soft, non tender Ext: left 3rd toe mildly erythematous, no drainage, small plantar wound   Discharge Instructions     Medication List    STOP taking these medications        clindamycin 300 MG capsule  Commonly known as:  CLEOCIN     HYDROcodone-acetaminophen 5-325 MG per tablet  Commonly known as:  NORCO      TAKE these  medications        atorvastatin 20 MG tablet  Commonly known as:  LIPITOR  Take 1 tablet (20 mg total) by mouth daily.     doxycycline 100 MG tablet  Commonly known as:  VIBRA-TABS  Take 1 tablet (100 mg total) by mouth every 12 (twelve) hours.     glucose monitoring kit monitoring kit  1 each by Does not apply route 4 (four) times daily - after meals and at bedtime. 1 month Diabetic Testing Supplies for QAC-QHS accuchecks.     RELION CONFIRM GLUCOSE MONITOR W/DEVICE Kit  1 kit by Does not apply route 3 (three) times daily.     insulin aspart 100 UNIT/ML injection  Commonly known as:  novoLOG  Inject 3 Units into the skin 3 (three) times daily before meals.     insulin glargine 100 UNIT/ML injection  Commonly known as:  LANTUS  Inject 0.24 mLs (24 Units total) into the skin at bedtime.     oxyCODONE-acetaminophen 5-325 MG per tablet  Commonly known as:  PERCOCET  Take 1-2 tablets by mouth every 6 (six) hours as needed.     Vitamin D (Ergocalciferol) 50000 UNITS Caps capsule  Commonly known as:  DRISDOL  Take 1 capsule (50,000 Units total) by mouth every 7 (seven) days.           Follow-up Information    Follow up with DUDA,MARCUS V, MD In 1 week.   Specialty:  Orthopedic Surgery   Why:  For wound re-check   Contact information:   [place] [address] [phone]       Follow up with Lorayne Marek, MD. Schedule an appointment as soon as possible for a visit in 3 weeks.   Specialty:  Internal Medicine   Contact information:   [place] [address] [phone]       The results of significant diagnostics from this hospitalization (including imaging, microbiology, ancillary and laboratory) are listed below for reference.    Significant Diagnostic Studies: Dg Foot Complete Left  03/20/2015   CLINICAL DATA:  Toe infection with ulcer on the midfoot.  EXAM: LEFT FOOT - COMPLETE 3+ VIEW  COMPARISON:  01/02/2014  FINDINGS:  There is an ulcer to the plantar forefoot at the MTP level with soft tissue gas in the third and fourth digits. These digits are displaced laterally by soft tissue swelling. There is no evidence of septic arthritis or acute osteomyelitis.  Remote transmetatarsal second toe amputation.  Ossicle or remote fracture to the medial malleolus.  IMPRESSION: Plantar forefoot ulcer with soft tissue gas in the third and fourth digits. No evidence of osteomyelitis.   Electronically Signed   By: Monte Fantasia M.D.   On: 03/20/2015 21:19    Microbiology: Recent Results (from the past 240 hour(s))  Blood culture (routine x 2)     Status: None (Preliminary result)   Collection Time: 03/20/15 11:30 PM  Result  Value Ref Range Status   Specimen Description BLOOD LEFT HAND  Final   Special Requests BOTTLES DRAWN AEROBIC AND ANAEROBIC 5CC  Final   Culture   Final           BLOOD CULTURE RECEIVED NO GROWTH TO DATE CULTURE WILL BE HELD FOR 5 DAYS BEFORE ISSUING A FINAL NEGATIVE REPORT Performed at [facility]    Report Status PENDING  Incomplete  Blood culture (routine x 2)     Status: None (Preliminary result)   Collection Time: 03/20/15 11:30 PM  Result Value Ref Range Status   Specimen Description BLOOD LEFT ARM  Final   Special Requests BOTTLES DRAWN AEROBIC AND ANAEROBIC 5CC  Final   Culture   Final           BLOOD CULTURE RECEIVED NO GROWTH TO DATE CULTURE WILL BE HELD FOR 5 DAYS BEFORE ISSUING A FINAL NEGATIVE REPORT Performed at [facility]    Report Status PENDING  Incomplete     Labs: Basic Metabolic Panel:  Recent Labs Lab 03/20/15 2000 03/21/15 0415  NA 131* 134*  K 5.2* 4.1  CL 99 103  CO2 26 26  GLUCOSE 626* 257*  BUN 21 22  CREATININE 0.96 0.61  CALCIUM 8.6 8.3*   Liver Function Tests:  Recent Labs Lab 03/21/15 0415  AST 15  ALT 16  ALKPHOS 128*  BILITOT 0.3  PROT 6.8  ALBUMIN 3.4*   No results for input(s): LIPASE, AMYLASE in the last 168 hours. No  results for input(s): AMMONIA in the last 168 hours. CBC:  Recent Labs Lab 03/20/15 2000 03/21/15 0415  WBC 5.4 6.5  NEUTROABS 2.7 2.9  HGB 11.1* 10.5*  HCT 33.5* 32.2*  MCV 89.8 89.0  PLT 363 303   CBG:  Recent Labs Lab 03/21/15 1313 03/21/15 1644 03/21/15 2229 03/22/15 0807 03/22/15 1212  GLUCAP 339* 377* 242* 280* 240*   Signed:  GHERGHE, COSTIN  [facility] 03/22/2015, 4:33 PM

## 2015-03-22 NOTE — Progress Notes (Signed)
Patient was instructed to have spouse come to listen to discharge teaching instructions. Had an interpreter, family was never seen during discharge teaching. Patient demonstrated how to pull up insulin for meals, pt had some difficulty seeing the syringe, but was able to demonstrate and teach back ow to check glucose, how to give pull up insulin and how to administer it. Pt also demonstrated and was able to verbally say how to change her dressing, and she changed her own dressing. Pt was given instructions of calling the doctor to make a follow up visit and after an hour and a half of teaching she was clear and had no questions. Family picked her up and she was discharged.

## 2015-03-22 NOTE — Progress Notes (Signed)
CARE MANAGEMENT NOTE 03/22/2015  Patient:  [name], [name]   Account Number:  [id number]  Date Initiated:  03/22/2015  Documentation initiated by:  Ivalene Platte  Subjective/Objective Assessment:   dka bld sugars greater than 600     Action/Plan:   [facility] and care in the past.   Anticipated DC Date:  03/22/2015   Anticipated DC Plan:  HOME/SELF CARE  In-house referral  Interpreting [place]  CM consult      Choice offered to / List presented to:             Status of service:  Completed, signed off Medicare Important Message given?   (If response is "NO", the following Medicare IM given date fields will be blank) Date Medicare IM given:   Medicare IM given by:   Date Additional Medicare IM given:   Additional Medicare IM given by:    Discharge Disposition:  HOME/SELF CARE  Per UR Regulation:  Reviewed for med. necessity/level of care/duration of stay  If discussed at [address] of Stay Meetings, dates discussed:    Comments:  March 22, 2015/Taelynn Mcelhannon L. Rosana Hoes, RN, BSN, CCM. Case Management [place] [phone] Information in spanish given to patient for use of the [facility].  wants to call for her appointment.  spanish written information given to patient for free glucometer voucher and information concerning. Good Rx drug card and usage.  Patient speaks littel [place] but understands spoken word and can vernbally communicate understanding.

## 2015-03-27 LAB — CULTURE, BLOOD (ROUTINE X 2)
Culture: NO GROWTH
Culture: NO GROWTH

## 2015-06-01 ENCOUNTER — Emergency Department (HOSPITAL_COMMUNITY): Payer: Medicaid Other

## 2015-06-01 ENCOUNTER — Inpatient Hospital Stay (HOSPITAL_COMMUNITY)
Admission: EM | Admit: 2015-06-01 | Discharge: 2015-06-12 | DRG: 853 | Disposition: A | Discharge status: Rehabilitation Facility | Payer: Medicaid Other | Attending: Internal Medicine | Admitting: Internal Medicine

## 2015-06-01 ENCOUNTER — Encounter (HOSPITAL_COMMUNITY): Payer: Self-pay | Admitting: Emergency Medicine

## 2015-06-01 DIAGNOSIS — A4101 Sepsis due to Methicillin susceptible Staphylococcus aureus: Principal | ICD-10-CM | POA: Diagnosis present

## 2015-06-01 DIAGNOSIS — Z794 Long term (current) use of insulin: Secondary | ICD-10-CM

## 2015-06-01 DIAGNOSIS — E876 Hypokalemia: Secondary | ICD-10-CM | POA: Diagnosis present

## 2015-06-01 DIAGNOSIS — T68XXXA Hypothermia, initial encounter: Secondary | ICD-10-CM

## 2015-06-01 DIAGNOSIS — J9600 Acute respiratory failure, unspecified whether with hypoxia or hypercapnia: Secondary | ICD-10-CM | POA: Diagnosis present

## 2015-06-01 DIAGNOSIS — C52 Malignant neoplasm of vagina: Secondary | ICD-10-CM | POA: Diagnosis present

## 2015-06-01 DIAGNOSIS — R6520 Severe sepsis without septic shock: Secondary | ICD-10-CM | POA: Insufficient documentation

## 2015-06-01 DIAGNOSIS — Z89519 Acquired absence of unspecified leg below knee: Secondary | ICD-10-CM | POA: Insufficient documentation

## 2015-06-01 DIAGNOSIS — E872 Acidosis: Secondary | ICD-10-CM | POA: Diagnosis present

## 2015-06-01 DIAGNOSIS — Z8589 Personal history of malignant neoplasm of other organs and systems: Secondary | ICD-10-CM

## 2015-06-01 DIAGNOSIS — A480 Gas gangrene: Secondary | ICD-10-CM | POA: Diagnosis present

## 2015-06-01 DIAGNOSIS — L089 Local infection of the skin and subcutaneous tissue, unspecified: Secondary | ICD-10-CM | POA: Diagnosis present

## 2015-06-01 DIAGNOSIS — N179 Acute kidney failure, unspecified: Secondary | ICD-10-CM | POA: Diagnosis present

## 2015-06-01 DIAGNOSIS — I959 Hypotension, unspecified: Secondary | ICD-10-CM

## 2015-06-01 DIAGNOSIS — R6521 Severe sepsis with septic shock: Secondary | ICD-10-CM | POA: Diagnosis present

## 2015-06-01 DIAGNOSIS — D638 Anemia in other chronic diseases classified elsewhere: Secondary | ICD-10-CM | POA: Diagnosis present

## 2015-06-01 DIAGNOSIS — E1165 Type 2 diabetes mellitus with hyperglycemia: Secondary | ICD-10-CM | POA: Diagnosis present

## 2015-06-01 DIAGNOSIS — E785 Hyperlipidemia, unspecified: Secondary | ICD-10-CM | POA: Diagnosis present

## 2015-06-01 DIAGNOSIS — Z89512 Acquired absence of left leg below knee: Secondary | ICD-10-CM | POA: Diagnosis not present

## 2015-06-01 DIAGNOSIS — E119 Type 2 diabetes mellitus without complications: Secondary | ICD-10-CM

## 2015-06-01 DIAGNOSIS — E11621 Type 2 diabetes mellitus with foot ulcer: Secondary | ICD-10-CM | POA: Diagnosis present

## 2015-06-01 DIAGNOSIS — I96 Gangrene, not elsewhere classified: Secondary | ICD-10-CM | POA: Diagnosis present

## 2015-06-01 DIAGNOSIS — A419 Sepsis, unspecified organism: Secondary | ICD-10-CM | POA: Diagnosis present

## 2015-06-01 DIAGNOSIS — L97529 Non-pressure chronic ulcer of other part of left foot with unspecified severity: Secondary | ICD-10-CM | POA: Diagnosis present

## 2015-06-01 DIAGNOSIS — I10 Essential (primary) hypertension: Secondary | ICD-10-CM | POA: Diagnosis present

## 2015-06-01 DIAGNOSIS — Z833 Family history of diabetes mellitus: Secondary | ICD-10-CM | POA: Diagnosis not present

## 2015-06-01 DIAGNOSIS — B373 Candidiasis of vulva and vagina: Secondary | ICD-10-CM | POA: Diagnosis present

## 2015-06-01 DIAGNOSIS — G92 Toxic encephalopathy: Secondary | ICD-10-CM | POA: Diagnosis present

## 2015-06-01 DIAGNOSIS — Z923 Personal history of irradiation: Secondary | ICD-10-CM | POA: Diagnosis not present

## 2015-06-01 DIAGNOSIS — Z452 Encounter for adjustment and management of vascular access device: Secondary | ICD-10-CM

## 2015-06-01 DIAGNOSIS — J9690 Respiratory failure, unspecified, unspecified whether with hypoxia or hypercapnia: Secondary | ICD-10-CM | POA: Insufficient documentation

## 2015-06-01 DIAGNOSIS — E10621 Type 1 diabetes mellitus with foot ulcer: Secondary | ICD-10-CM | POA: Insufficient documentation

## 2015-06-01 LAB — I-STAT CHEM 8, ED
BUN: 52 mg/dL — AB (ref 6–20)
CALCIUM ION: 1.1 mmol/L — AB (ref 1.12–1.23)
CHLORIDE: 90 mmol/L — AB (ref 101–111)
Creatinine, Ser: 2 mg/dL — ABNORMAL HIGH (ref 0.44–1.00)
Glucose, Bld: 546 mg/dL — ABNORMAL HIGH (ref 65–99)
HEMATOCRIT: 35 % — AB (ref 36.0–46.0)
Hemoglobin: 11.9 g/dL — ABNORMAL LOW (ref 12.0–15.0)
Potassium: 4.9 mmol/L (ref 3.5–5.1)
SODIUM: 118 mmol/L — AB (ref 135–145)
TCO2: 14 mmol/L (ref 0–100)

## 2015-06-01 LAB — COMPREHENSIVE METABOLIC PANEL
ALBUMIN: 2.3 g/dL — AB (ref 3.5–5.0)
ALK PHOS: 193 U/L — AB (ref 38–126)
ALT: 22 U/L (ref 14–54)
AST: 28 U/L (ref 15–41)
Anion gap: 23 — ABNORMAL HIGH (ref 5–15)
BUN: 55 mg/dL — ABNORMAL HIGH (ref 6–20)
CO2: 12 mmol/L — ABNORMAL LOW (ref 22–32)
Calcium: 8.2 mg/dL — ABNORMAL LOW (ref 8.9–10.3)
Chloride: 85 mmol/L — ABNORMAL LOW (ref 101–111)
Creatinine, Ser: 2.15 mg/dL — ABNORMAL HIGH (ref 0.44–1.00)
GFR calc Af Amer: 30 mL/min — ABNORMAL LOW (ref >60)
GFR calc non Af Amer: 26 mL/min — ABNORMAL LOW (ref >60)
Glucose, Bld: 531 mg/dL — ABNORMAL HIGH (ref 65–99)
POTASSIUM: 5 mmol/L (ref 3.5–5.1)
SODIUM: 120 mmol/L — AB (ref 135–145)
Total Bilirubin: 1.6 mg/dL — ABNORMAL HIGH (ref 0.3–1.2)
Total Protein: 6.6 g/dL (ref 6.5–8.1)

## 2015-06-01 LAB — URINALYSIS, ROUTINE W REFLEX MICROSCOPIC
BILIRUBIN URINE: NEGATIVE
Glucose, UA: >1000 mg/dL — AB
Ketones, ur: NEGATIVE mg/dL
NITRITE: NEGATIVE
PROTEIN: NEGATIVE mg/dL
SPECIFIC GRAVITY, URINE: 1.015 (ref 1.005–1.030)
UROBILINOGEN UA: 0.2 mg/dL (ref 0.0–1.0)
pH: 5 (ref 5.0–8.0)

## 2015-06-01 LAB — I-STAT CG4 LACTIC ACID, ED: LACTIC ACID, VENOUS: 3.27 mmol/L — AB (ref 0.5–2.0)

## 2015-06-01 LAB — URINE MICROSCOPIC-ADD ON

## 2015-06-01 LAB — CBC WITH DIFFERENTIAL/PLATELET
BASOS PCT: 0 % (ref 0–1)
Basophils Absolute: 0 10*3/uL (ref 0.0–0.1)
Eosinophils Absolute: 0 10*3/uL (ref 0.0–0.7)
Eosinophils Relative: 0 % (ref 0–5)
HCT: 29 % — ABNORMAL LOW (ref 36.0–46.0)
Hemoglobin: 9.9 g/dL — ABNORMAL LOW (ref 12.0–15.0)
LYMPHS PCT: 4 % — AB (ref 12–46)
Lymphs Abs: 0.5 10*3/uL — ABNORMAL LOW (ref 0.7–4.0)
MCH: 28.9 pg (ref 26.0–34.0)
MCHC: 34.1 g/dL (ref 30.0–36.0)
MCV: 84.8 fL (ref 78.0–100.0)
MONO ABS: 0.3 10*3/uL (ref 0.1–1.0)
MONOS PCT: 2 % — AB (ref 3–12)
NEUTROS ABS: 12.6 10*3/uL — AB (ref 1.7–7.7)
Neutrophils Relative %: 94 % — ABNORMAL HIGH (ref 43–77)
Platelets: 488 10*3/uL — ABNORMAL HIGH (ref 150–400)
RBC: 3.42 MIL/uL — ABNORMAL LOW (ref 3.87–5.11)
RDW: 13.5 % (ref 11.5–15.5)
WBC: 13.4 10*3/uL — ABNORMAL HIGH (ref 4.0–10.5)

## 2015-06-01 LAB — CBG MONITORING, ED
GLUCOSE-CAPILLARY: 461 mg/dL — AB (ref 65–99)
GLUCOSE-CAPILLARY: 396 mg/dL — AB (ref 65–99)

## 2015-06-01 LAB — SEDIMENTATION RATE: SED RATE: 123 mm/h — AB (ref 0–22)

## 2015-06-01 MED ORDER — PIPERACILLIN-TAZOBACTAM 3.375 G IVPB 30 MIN
3.3750 g | INTRAVENOUS | Status: AC
  Administered 2015-06-01: 3.375 g via INTRAVENOUS
  Filled 2015-06-01: qty 50

## 2015-06-01 MED ORDER — DEXTROSE 5 % IV SOLN
2.0000 ug/min | INTRAVENOUS | Status: DC
  Administered 2015-06-01: 5 ug/min via INTRAVENOUS
  Filled 2015-06-01: qty 4

## 2015-06-01 MED ORDER — SODIUM CHLORIDE 0.9 % IV SOLN
INTRAVENOUS | Status: DC
  Administered 2015-06-01: 3.4 [IU]/h via INTRAVENOUS
  Filled 2015-06-01: qty 2.5

## 2015-06-01 MED ORDER — METRONIDAZOLE IN NACL 5-0.79 MG/ML-% IV SOLN
500.0000 mg | Freq: Three times a day (TID) | INTRAVENOUS | Status: DC
  Administered 2015-06-01 – 2015-06-02 (×2): 500 mg via INTRAVENOUS
  Filled 2015-06-01 (×2): qty 100

## 2015-06-01 MED ORDER — VANCOMYCIN HCL IN DEXTROSE 1-5 GM/200ML-% IV SOLN
1000.0000 mg | INTRAVENOUS | Status: AC
  Administered 2015-06-01: 1000 mg via INTRAVENOUS
  Filled 2015-06-01: qty 200

## 2015-06-01 MED ORDER — DEXTROSE-NACL 5-0.45 % IV SOLN: INTRAVENOUS | Status: DC

## 2015-06-01 MED ORDER — SODIUM CHLORIDE 0.9 % IV BOLUS (SEPSIS)
1000.0000 mL | INTRAVENOUS | Status: AC
  Administered 2015-06-01 (×2): 1000 mL via INTRAVENOUS

## 2015-06-01 NOTE — ED Notes (Signed)
Pt CBG 396.  Sharrie Rothman, RN made aware.

## 2015-06-01 NOTE — ED Notes (Signed)
Pt arrived to the ED with a complaint of left foot pain.  Pt states that the foot pain has been present for a week.  Pt left foot is swollen with a 1 cm x 1 cm open wound on the distal portion of the ball of the foot.  All her toes are a dark color.  Pt rates pain as 10/10.  Pt also is hypotensive.  Pt has not been eating solid food for a month.  Pt states that she is nauseated but has not episodes of emesis.  Pt states she has had water and a little chicken broth for the previous week.  Per pt she is on diabetic medication.  Pt states she tested herself yesterday and her CBG was 180.

## 2015-06-01 NOTE — Progress Notes (Signed)
[facility] Progress Note Patient Name: Finn Altemose DOB: [birth year] MRN: [id number]   Date of Service  06/01/2015  HPI/Events of Note  Diabetic, with sepsisshock on levophed 68mcg after 5L fluid, lactae 3, creat 2.15 and has gangrenous left foot - likely needs amputation by Dr Erlinda Hong  Patient currently in [facility] ER  Resus A. Not intubated   eICU Interventions  1. Admt to [facility] - ccm service; order done 2. EDP to place CVL 3. Bedside ORtho consult - likely needs stat tinubation 4. PCCM will eval at bedside when patient is in [facility]     Intervention Category Major Interventions: Other:;Shock - evaluation and management;Sepsis - evaluation and management  Ludwin Flahive 06/01/2015, 11:27 PM

## 2015-06-01 NOTE — ED Provider Notes (Signed)
CSN: [id number]     Arrival date & time 06/01/15  2047 History   First MD Initiated Contact with Patient 06/01/15 2114     Chief Complaint  Patient presents with  . Code Sepsis     (Consider location/radiation/quality/duration/timing/severity/associated sxs/prior Treatment) Patient is a 49 y.o. female presenting with lower extremity pain. The history is provided by the patient and medical records. No language interpreter was used.  Foot Pain Associated symptoms include arthralgias (left foot). Pertinent negatives include no abdominal pain, chest pain, coughing, diaphoresis, fatigue, fever, headaches, nausea, rash or vomiting.     Marielle Mantione is a 49 y.o. female  with a hx of IDDM, recurrent diabetic foot ulcers presents to the Emergency Department complaining of gradual, persistent, progressively worsening left foot pain rated at a 10/10 with associated wound to the plantar surface of the foot onset 1 week ago with worsening in the last 3 days. Associated symptoms include nausea, fever, fatigue, sweats.  Nothing makes it better and nothing makes it worse.  Pt's last follow-up with PCP was 4 months ago.  She reports she is taking her insulin as directed, but does not know what her blood sugars have been.  Pt denies headache, neck pain, chest pain, SOB, abd pain, vomiting, diarrhea, dizziness, syncope.     Past Medical History  Diagnosis Date  . Diabetes mellitus   . 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 Janie Morning), Cysto performed by Dr. Jonna Munro 12/2009 with evac of clots and bx and fulguration. // H/O stage 2 SCC of the vulva  . Anemia     2/2 blood loss  . Diabetes mellitus type 2, uncontrolled DX: 2001  . 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  Diabetic foot ulcers   . Cataract   . Diabetes mellitus without complication    Past Surgical History  Procedure Laterality Date  . Radical wide local excision of the vulva and right nguinal lymph node dissection  10/2003    Dr. Fermin Schwab  . Uterine dilatation and currettage    . Labial mass excision  07/2003    Dr. Ree Edman  . Left second toe mtp joint amputation  12/19/2010    Dr. Mayer Camel  . Irrigation and debridement of left foot with removal of left  01/29/2011    Dr. Mayer Camel  . Tubal ligation     Family History  Problem Relation Age of Onset  . Diabetes Mother   . Diabetes Father   . Diabetes Brother    History  Substance Use Topics  . Smoking status: Never Smoker   . Smokeless tobacco: Never Used  . Alcohol Use: No   OB History    No data available     Review of Systems  Constitutional: Negative for fever, diaphoresis, appetite change, fatigue and unexpected weight change.  HENT: Negative for mouth sores.   Eyes: Negative for visual disturbance.  Respiratory: Negative for cough, chest tightness, shortness of breath and wheezing.   Cardiovascular: Negative for chest pain.  Gastrointestinal: Negative for nausea, vomiting, abdominal pain, diarrhea and constipation.  Endocrine: Negative for polydipsia, polyphagia and polyuria.  Genitourinary: Negative for dysuria, urgency, frequency and hematuria.  Musculoskeletal: Positive for arthralgias (left foot). Negative for back pain and neck stiffness.  Skin: Negative for rash.  Allergic/Immunologic: Negative for immunocompromised state.  Neurological: Negative for syncope, light-headedness and headaches.  Hematological: Does not bruise/bleed easily.  Psychiatric/Behavioral: Negative for sleep disturbance. The patient is not nervous/anxious.       Allergies  Review of patient's allergies indicates no known allergies.  Home Medications   Prior to Admission medications   Medication Sig Start Date End Date Taking?  Authorizing Provider  insulin aspart (NOVOLOG) 100 UNIT/ML injection Inject 3 Units into the skin 3 (three) times daily before meals. Patient taking differently: Inject 18 Units into the skin 4 (four) times daily - after meals and at bedtime.  03/22/15  Yes Costin Karlyne Greenspan, MD  insulin glargine (LANTUS) 100 UNIT/ML injection Inject 0.24 mLs (24 Units total) into the skin at bedtime. Patient taking differently: Inject 16 Units into the skin at bedtime.  03/22/15  Yes Costin Karlyne Greenspan, MD  atorvastatin (LIPITOR) 20 MG tablet Take 1 tablet (20 mg total) by mouth daily. Patient not taking: Reported on 03/20/2015 12/26/14   Lorayne Marek, MD  Blood Glucose Monitoring Suppl (RELION CONFIRM GLUCOSE MONITOR) W/DEVICE KIT 1 kit by Does not apply route 3 (three) times daily. 03/22/15   Costin Karlyne Greenspan, MD  doxycycline (VIBRA-TABS) 100 MG tablet Take 1 tablet (100 mg total) by mouth every 12 (twelve) hours. Patient not taking: Reported on 06/01/2015 03/22/15   Caren Griffins, MD  glucose monitoring kit (FREESTYLE) monitoring kit 1 each by Does not apply route 4 (four) times daily - after meals and at bedtime. 1 month Diabetic Testing Supplies for QAC-QHS accuchecks. Patient not taking: Reported on 03/20/2015 12/20/14   Lorayne Marek, MD  oxyCODONE-acetaminophen (PERCOCET) 5-325 MG per tablet Take 1-2 tablets by mouth every 6 (six) hours as needed. Patient not taking: Reported on 03/20/2015 07/04/14   Wandra Arthurs, MD  Vitamin D, Ergocalciferol, (DRISDOL) 50000 UNITS CAPS capsule Take 1 capsule (50,000 Units total) by mouth every 7 (seven) days. Patient not taking: Reported on 03/20/2015 12/26/14   Lorayne Marek, MD   BP 104/53 mmHg  Pulse 95  Temp(Src) 96.9 F (36.1 C) (Oral)  Resp 13  Wt 132 lb 15 oz (60.3 kg)  SpO2 100%  LMP 05/12/2013 Physical Exam  Constitutional: She appears well-developed and well-nourished. No distress.  Awake, alert, ill-appearing  HENT:  Head: Normocephalic and atraumatic.    Mouth/Throat: Oropharynx is clear and moist. No oropharyngeal exudate.  Eyes: Conjunctivae are normal. No scleral icterus.  Neck: Normal range of motion. Neck supple.  Cardiovascular: Regular rhythm, normal heart sounds and intact distal pulses.  Tachycardia present.   Pulses:      Radial pulses are 1+ on the right side, and 1+ on the left side.       Dorsalis pedis pulses are 1+ on the right side, and 0 on the left side.  Pulmonary/Chest: Effort normal and breath sounds normal. No respiratory distress. She has no wheezes.  Equal chest expansion  Abdominal: Soft. Bowel sounds are normal. She exhibits no mass. There is no tenderness. There is no rebound and no guarding.  Soft and nontender  Musculoskeletal: Normal range of motion. She exhibits no edema.       Left foot: There is tenderness and swelling.  Large ulcer to the plantar surface of the left foot with extension into the deep tissue; dorsum of the foot with large collection of fluid and blood in a bulla No palpable dorsalis pedis pulse however capillary refill at 4 seconds  Palpable crepitus to the dorsum of the foot consistent with free air  Neurological: She is alert.  Speech is clear and goal oriented Moves extremities without ataxia  Skin: Skin is warm. She is diaphoretic.  Psychiatric: She has a normal mood and affect.  Nursing note and vitals reviewed.           ED Course  Procedures (including critical care time) Labs Review Labs Reviewed  COMPREHENSIVE METABOLIC PANEL - Abnormal; Notable for the following:    Sodium 120 (*)    Chloride 85 (*)    CO2 12 (*)    Glucose, Bld 531 (*)    BUN 55 (*)    Creatinine, Ser 2.15 (*)    Calcium 8.2 (*)    Albumin 2.3 (*)    Alkaline Phosphatase 193 (*)    Total Bilirubin 1.6 (*)    GFR calc non Af Amer 26 (*)    GFR calc Af Amer 30 (*)    Anion gap 23 (*)    All other components within normal limits  CBC WITH DIFFERENTIAL/PLATELET - Abnormal; Notable for the  following:    WBC 13.4 (*)    RBC 3.42 (*)    Hemoglobin 9.9 (*)    HCT 29.0 (*)    Platelets 488 (*)    Neutrophils Relative % 94 (*)    Lymphocytes Relative 4 (*)    Monocytes Relative 2 (*)    Neutro Abs 12.6 (*)    Lymphs Abs 0.5 (*)    All other components within normal limits  URINALYSIS, ROUTINE W REFLEX MICROSCOPIC (NOT AT [facility]) - Abnormal; Notable for the following:    APPearance TURBID (*)    Glucose, UA >1000 (*)    Hgb urine dipstick LARGE (*)    Leukocytes, UA LARGE (*)    All other components within normal limits  SEDIMENTATION RATE - Abnormal; Notable for the following:    Sed Rate 123 (*)    All other components within normal limits  URINE MICROSCOPIC-ADD ON - Abnormal; Notable for the following:    Squamous Epithelial / LPF FEW (*)    Bacteria, UA MANY (*)    All other components within normal limits  I-STAT CG4 LACTIC ACID, ED - Abnormal; Notable for the following:    Lactic Acid, Venous 3.27 (*)    All other components within normal limits  CBG MONITORING, ED - Abnormal; Notable for the following:    Glucose-Capillary 461 (*)    All other components within normal limits  I-STAT CHEM 8, ED - Abnormal; Notable for the following:    Sodium 118 (*)    Chloride 90 (*)    BUN 52 (*)    Creatinine, Ser 2.00 (*)    Glucose, Bld 546 (*)    Calcium, Ion 1.10 (*)    Hemoglobin 11.9 (*)    HCT 35.0 (*)    All other components within normal limits  CBG MONITORING, ED - Abnormal; Notable for the following:    Glucose-Capillary 396 (*)    All other components within normal limits  CBG MONITORING, ED - Abnormal; Notable for the following:    Glucose-Capillary 375 (*)    All other components within normal limits  CULTURE, BLOOD (ROUTINE X 2)  CULTURE, BLOOD (ROUTINE X 2)  URINE CULTURE  HEMOGLOBIN A1C  HIV ANTIBODY (ROUTINE TESTING)  C-REACTIVE PROTEIN  PREALBUMIN  I-STAT CG4 LACTIC ACID, ED    Imaging Review Dg Chest [facility] [address]  06/01/2015   CLINICAL  DATA:  Sepsis  EXAM: PORTABLE CHEST - 1 VIEW  COMPARISON:  06/27/2014  FINDINGS: A single AP portable view of the chest demonstrates no focal airspace consolidation or alveolar edema. The lungs are grossly clear. There is no large effusion or pneumothorax. Cardiac and mediastinal contours appear unremarkable.  IMPRESSION: No active disease.   Electronically Signed   By: Andreas Newport M.D.   On: 06/01/2015 23:00   Ap / Lateral X-ray Left Foot  06/01/2015   CLINICAL DATA:  Pain and swelling of the foot with discoloration and open wounds.  EXAM: LEFT FOOT - 2 VIEW  COMPARISON:  03/20/2015  FINDINGS: There is prior transmetatarsal amputation of the second digit.  There is extensive soft tissue gas throughout the forefoot, midfoot and hindfoot with marked soft tissue swelling. There is no radiopaque foreign body. There is no frank bony destruction.  IMPRESSION: Extensive soft tissue gas throughout the foot. Soft tissue gas appears to be tracking up the lower leg as well, at the margin of the AP image.   Electronically Signed   By: Andreas Newport M.D.   On: 06/01/2015 23:12     EKG Interpretation None       ECG ED ECG REPORT   Date: 06/01/2015  Rate: 97  Rhythm: sinus tachycardia and premature ventricular contractions (PVC)  QRS Axis: normal  Intervals: normal  ST/T Wave abnormalities: ST elevations diffusely  Conduction Disutrbances:none  Narrative Interpretation: Sinus Tach with PVCs; diffuse ST elevation   Old EKG Reviewed: unchanged from 05/10/2012   CRITICAL CARE Performed by: Abigail Butts Total critical care time: 1 hour Critical care time was exclusive of separately billable procedures and treating other patients. Critical care was necessary to treat or prevent imminent or life-threatening deterioration. Critical care was time spent personally by me on the following activities: development of treatment plan with patient and/or surrogate as well as nursing, discussions with  consultants, evaluation of patient's response to treatment, examination of patient, obtaining history from patient or surrogate, ordering and performing treatments and interventions, ordering and review of laboratory studies, ordering and review of radiographic studies, pulse oximetry and re-evaluation of patient's condition.   MDM   Final diagnoses:  Foot infection  Severe sepsis  Hypotension, unspecified hypotension type  Hypothermia, initial encounter  Septic shock  Diabetic ulcer of left foot associated with type 1 diabetes mellitus   [name] presents with severe sepsis, hypotension, and severely infected left foot with bulla and free air in the tissues.  Clinical exam consistent with wet gangrene and concern that this is the source of her sepsis.  Will consult ortho.  Pt will need admission to PCCM.    10:30  Pt hyperglycemic - glucose stabilizer started. Elevated lactic acid. She remains hypotensive. Patient hypothermic 96.3 with temp Foley.  Lactic acid 3.27. BUN/creatinine elevated. Creatinine 2.15.  Glucose 531 with an anion gap of 23. Leukocytosis of 13.4. Patient remains gravely ill.    11:11 PM Pt with BP continuing to trend downward.  Levophed at 44mg/min.  Pt. Discussed with Dr. XErlinda Hongwho will evaluate.  [facility]    11:29 PM Pt discussed with Dr. RChase Caller  [facility]   BP 77/58 mmHg  Pulse 100  Temp(Src) 96.2 F (35.7 C) (Oral)  Resp 15  SpO2 100%  LMP 05/12/2013   12:28 AM Dr. XErlinda Honghas evaluated the patient and recommends OR  washout.  She is refusing amputation at this time. She will be going to the OR now and then will be transferred to [facility] to the ICU.  Pt has 4 IVs in place.  Vasopressor changed to Neo.  Pt stable at this time.    BP 104/53 mmHg  Pulse 95  Temp(Src) 96.9 F (36.1 C) (Oral)  Resp 13  Wt 132 lb 15 oz (60.3 kg)  SpO2 100%  LMP 05/12/2013   Jarrett Soho ,  PA-C 06/02/15 [id number]  Charlesetta Shanks, MD 06/02/15 [phone]

## 2015-06-02 ENCOUNTER — Encounter (HOSPITAL_COMMUNITY)
Admission: EM | Disposition: A | Discharge status: Rehabilitation Facility | Payer: Self-pay | Source: Home / Self Care | Attending: Internal Medicine

## 2015-06-02 ENCOUNTER — Encounter (HOSPITAL_COMMUNITY): Payer: Self-pay | Admitting: Anesthesiology

## 2015-06-02 ENCOUNTER — Inpatient Hospital Stay (HOSPITAL_COMMUNITY): Payer: Medicaid Other

## 2015-06-02 ENCOUNTER — Inpatient Hospital Stay (HOSPITAL_COMMUNITY): Payer: Medicaid Other | Admitting: Certified Registered Nurse Anesthetist

## 2015-06-02 DIAGNOSIS — E10621 Type 1 diabetes mellitus with foot ulcer: Secondary | ICD-10-CM

## 2015-06-02 DIAGNOSIS — A419 Sepsis, unspecified organism: Secondary | ICD-10-CM

## 2015-06-02 DIAGNOSIS — I96 Gangrene, not elsewhere classified: Secondary | ICD-10-CM

## 2015-06-02 DIAGNOSIS — J9601 Acute respiratory failure with hypoxia: Secondary | ICD-10-CM

## 2015-06-02 DIAGNOSIS — R6521 Severe sepsis with septic shock: Secondary | ICD-10-CM

## 2015-06-02 DIAGNOSIS — J9690 Respiratory failure, unspecified, unspecified whether with hypoxia or hypercapnia: Secondary | ICD-10-CM | POA: Insufficient documentation

## 2015-06-02 DIAGNOSIS — L97529 Non-pressure chronic ulcer of other part of left foot with unspecified severity: Secondary | ICD-10-CM

## 2015-06-02 HISTORY — PX: AMPUTATION: SHX166

## 2015-06-02 HISTORY — PX: I & D EXTREMITY: SHX5045

## 2015-06-02 LAB — COMPREHENSIVE METABOLIC PANEL
ALBUMIN: 1.8 g/dL — AB (ref 3.5–5.0)
ALT: 22 U/L (ref 14–54)
AST: 29 U/L (ref 15–41)
Alkaline Phosphatase: 165 U/L — ABNORMAL HIGH (ref 38–126)
Anion gap: 12 (ref 5–15)
BUN: 31 mg/dL — AB (ref 6–20)
CALCIUM: 6.9 mg/dL — AB (ref 8.9–10.3)
CHLORIDE: 112 mmol/L — AB (ref 101–111)
CO2: 19 mmol/L — AB (ref 22–32)
Creatinine, Ser: 1.09 mg/dL — ABNORMAL HIGH (ref 0.44–1.00)
GFR calc Af Amer: >60 mL/min (ref >60)
GFR, EST NON AFRICAN AMERICAN: 58 mL/min — AB (ref >60)
Glucose, Bld: 312 mg/dL — ABNORMAL HIGH (ref 65–99)
POTASSIUM: 3.4 mmol/L — AB (ref 3.5–5.1)
SODIUM: 143 mmol/L (ref 135–145)
Total Bilirubin: 1.2 mg/dL (ref 0.3–1.2)
Total Protein: 5.7 g/dL — ABNORMAL LOW (ref 6.5–8.1)

## 2015-06-02 LAB — C-REACTIVE PROTEIN: CRP: 44.7 mg/dL — ABNORMAL HIGH (ref <1.0)

## 2015-06-02 LAB — BLOOD GAS, ARTERIAL
ACID-BASE DEFICIT: 5.9 mmol/L — AB (ref 0.0–2.0)
Acid-base deficit: 8.6 mmol/L — ABNORMAL HIGH (ref 0.0–2.0)
BICARBONATE: 17.2 meq/L — AB (ref 20.0–24.0)
Bicarbonate: 19.8 mEq/L — ABNORMAL LOW (ref 20.0–24.0)
DRAWN BY: 347621
Drawn by: 331471
FIO2: 1 %
FIO2: 0.3 %
LHR: 18 {breaths}/min
MECHVT: 350 mL
O2 SAT: 97.8 %
O2 Saturation: 99 %
PCO2 ART: 41.8 mmHg (ref 35.0–45.0)
PEEP/CPAP: 5 cmH2O
PEEP/CPAP: 5 cmH2O
PH ART: 7.289 — AB (ref 7.350–7.450)
Patient temperature: 96.9
Patient temperature: 98.6
RATE: 18 resp/min
TCO2: 15.4 mmol/L (ref 0–100)
TCO2: 18.2 mmol/L (ref 0–100)
VT: 350 mL
pCO2 arterial: 36.5 mmHg (ref 35.0–45.0)
pH, Arterial: 7.297 — ABNORMAL LOW (ref 7.350–7.450)
pO2, Arterial: 389 mmHg — ABNORMAL HIGH (ref 80.0–100.0)
pO2, Arterial: 132 mmHg — ABNORMAL HIGH (ref 80.0–100.0)

## 2015-06-02 LAB — CORTISOL
CORTISOL PLASMA: 23.1 ug/dL
Cortisol, Plasma: 27.2 ug/dL

## 2015-06-02 LAB — CBC WITH DIFFERENTIAL/PLATELET
Basophils Absolute: 0 10*3/uL (ref 0.0–0.1)
Basophils Relative: 0 % (ref 0–1)
Eosinophils Absolute: 0 10*3/uL (ref 0.0–0.7)
Eosinophils Relative: 0 % (ref 0–5)
HCT: 24.3 % — ABNORMAL LOW (ref 36.0–46.0)
HEMOGLOBIN: 8.4 g/dL — AB (ref 12.0–15.0)
LYMPHS ABS: 1 10*3/uL (ref 0.7–4.0)
Lymphocytes Relative: 5 % — ABNORMAL LOW (ref 12–46)
MCH: 29 pg (ref 26.0–34.0)
MCHC: 34.6 g/dL (ref 30.0–36.0)
MCV: 83.8 fL (ref 78.0–100.0)
Monocytes Absolute: 0.8 10*3/uL (ref 0.1–1.0)
Monocytes Relative: 4 % (ref 3–12)
NEUTROS PCT: 91 % — AB (ref 43–77)
Neutro Abs: 19 10*3/uL — ABNORMAL HIGH (ref 1.7–7.7)
PLATELETS: 555 10*3/uL — AB (ref 150–400)
RBC: 2.9 MIL/uL — AB (ref 3.87–5.11)
RDW: 13.2 % (ref 11.5–15.5)
WBC: 20.8 10*3/uL — ABNORMAL HIGH (ref 4.0–10.5)

## 2015-06-02 LAB — GLUCOSE, CAPILLARY
GLUCOSE-CAPILLARY: 194 mg/dL — AB (ref 65–99)
GLUCOSE-CAPILLARY: 167 mg/dL — AB (ref 65–99)
GLUCOSE-CAPILLARY: 185 mg/dL — AB (ref 65–99)
GLUCOSE-CAPILLARY: 216 mg/dL — AB (ref 65–99)
Glucose-Capillary: 321 mg/dL — ABNORMAL HIGH (ref 65–99)
Glucose-Capillary: 327 mg/dL — ABNORMAL HIGH (ref 65–99)
Glucose-Capillary: 293 mg/dL — ABNORMAL HIGH (ref 65–99)
Glucose-Capillary: 234 mg/dL — ABNORMAL HIGH (ref 65–99)
Glucose-Capillary: 231 mg/dL — ABNORMAL HIGH (ref 65–99)
Glucose-Capillary: 188 mg/dL — ABNORMAL HIGH (ref 65–99)
Glucose-Capillary: 179 mg/dL — ABNORMAL HIGH (ref 65–99)
Glucose-Capillary: 257 mg/dL — ABNORMAL HIGH (ref 65–99)
Glucose-Capillary: 267 mg/dL — ABNORMAL HIGH (ref 65–99)

## 2015-06-02 LAB — PHOSPHORUS: PHOSPHORUS: 3.2 mg/dL (ref 2.5–4.6)

## 2015-06-02 LAB — CBG MONITORING, ED: Glucose-Capillary: 375 mg/dL — ABNORMAL HIGH (ref 65–99)

## 2015-06-02 LAB — GRAM STAIN

## 2015-06-02 LAB — TROPONIN I
TROPONIN I: 0.03 ng/mL (ref <0.031)
Troponin I: <0.03 ng/mL (ref <0.031)
Troponin I: 0.06 ng/mL — ABNORMAL HIGH (ref <0.031)

## 2015-06-02 LAB — LACTIC ACID, PLASMA
LACTIC ACID, VENOUS: 1.5 mmol/L (ref 0.5–2.0)
LACTIC ACID, VENOUS: 1.3 mmol/L (ref 0.5–2.0)
LACTIC ACID, VENOUS: 1.2 mmol/L (ref 0.5–2.0)
Lactic Acid, Venous: 1.5 mmol/L (ref 0.5–2.0)

## 2015-06-02 LAB — BRAIN NATRIURETIC PEPTIDE: B Natriuretic Peptide: 194.6 pg/mL — ABNORMAL HIGH (ref 0.0–100.0)

## 2015-06-02 LAB — MRSA PCR SCREENING: MRSA BY PCR: NEGATIVE

## 2015-06-02 LAB — PROTIME-INR
INR: 1.43 (ref 0.00–1.49)
Prothrombin Time: 17.5 seconds — ABNORMAL HIGH (ref 11.6–15.2)

## 2015-06-02 LAB — PREPARE RBC (CROSSMATCH)

## 2015-06-02 LAB — HIV ANTIBODY (ROUTINE TESTING W REFLEX): HIV SCREEN 4TH GENERATION: NONREACTIVE

## 2015-06-02 LAB — PREALBUMIN

## 2015-06-02 LAB — APTT: APTT: 31 s (ref 24–37)

## 2015-06-02 LAB — MAGNESIUM
Magnesium: 1.9 mg/dL (ref 1.7–2.4)
Magnesium: 1.9 mg/dL (ref 1.7–2.4)

## 2015-06-02 LAB — PROCALCITONIN: Procalcitonin: 14.82 ng/mL

## 2015-06-02 LAB — LIPASE, BLOOD

## 2015-06-02 LAB — AMYLASE: Amylase: 25 U/L — ABNORMAL LOW (ref 28–100)

## 2015-06-02 SURGERY — IRRIGATION AND DEBRIDEMENT EXTREMITY
Anesthesia: General | Laterality: Left

## 2015-06-02 MED ORDER — PANTOPRAZOLE SODIUM 40 MG PO PACK
40.0000 mg | PACK | ORAL | Status: DC
  Administered 2015-06-03: 40 mg
  Filled 2015-06-02 (×4): qty 20

## 2015-06-02 MED ORDER — PIPERACILLIN-TAZOBACTAM 3.375 G IVPB
INTRAVENOUS | Status: AC
  Filled 2015-06-02: qty 50

## 2015-06-02 MED ORDER — SODIUM CHLORIDE 0.9 % IV SOLN
25.0000 ug/h | INTRAVENOUS | Status: DC
  Administered 2015-06-02: 50 ug/h via INTRAVENOUS
  Administered 2015-06-02: 100 ug/h via INTRAVENOUS
  Administered 2015-06-03: 175 ug/h via INTRAVENOUS
  Administered 2015-06-03: 25 ug/h via INTRAVENOUS
  Administered 2015-06-04: 250 ug/h via INTRAVENOUS
  Administered 2015-06-05: 25 ug/h via INTRAVENOUS
  Filled 2015-06-02 (×5): qty 50

## 2015-06-02 MED ORDER — SODIUM CHLORIDE 0.9 % IV SOLN
Freq: Once | INTRAVENOUS | Status: AC
  Administered 2015-06-02: 19:00:00 via INTRAVENOUS

## 2015-06-02 MED ORDER — FENTANYL CITRATE (PF) 100 MCG/2ML IJ SOLN
50.0000 ug | Freq: Once | INTRAMUSCULAR | Status: AC
  Administered 2015-06-02: 50 ug via INTRAVENOUS

## 2015-06-02 MED ORDER — FENTANYL BOLUS VIA INFUSION
50.0000 ug | INTRAVENOUS | Status: DC | PRN
  Administered 2015-06-02: 50 ug via INTRAVENOUS
  Filled 2015-06-02: qty 50

## 2015-06-02 MED ORDER — VANCOMYCIN HCL IN DEXTROSE 750-5 MG/150ML-% IV SOLN: 750.0000 mg | INTRAVENOUS | Status: DC

## 2015-06-02 MED ORDER — PHENYLEPHRINE HCL 10 MG/ML IJ SOLN
30.0000 ug/min | INTRAVENOUS | Status: DC
  Filled 2015-06-02 (×2): qty 1

## 2015-06-02 MED ORDER — CETYLPYRIDINIUM CHLORIDE 0.05 % MT LIQD
7.0000 mL | Freq: Four times a day (QID) | OROMUCOSAL | Status: DC
  Administered 2015-06-02 – 2015-06-06 (×15): 7 mL via OROMUCOSAL

## 2015-06-02 MED ORDER — FENTANYL CITRATE (PF) 100 MCG/2ML IJ SOLN
INTRAMUSCULAR | Status: DC | PRN
  Administered 2015-06-02: 100 ug via INTRAVENOUS

## 2015-06-02 MED ORDER — MIDAZOLAM HCL 2 MG/2ML IJ SOLN
INTRAMUSCULAR | Status: AC
  Filled 2015-06-02: qty 2

## 2015-06-02 MED ORDER — PROPOFOL 10 MG/ML IV BOLUS
INTRAVENOUS | Status: AC
  Filled 2015-06-02: qty 20

## 2015-06-02 MED ORDER — VITAL HIGH PROTEIN PO LIQD
1000.0000 mL | ORAL | Status: DC
  Filled 2015-06-02: qty 1000

## 2015-06-02 MED ORDER — FENTANYL CITRATE (PF) 100 MCG/2ML IJ SOLN
100.0000 ug | INTRAMUSCULAR | Status: AC | PRN
  Administered 2015-06-02 (×3): 100 ug via INTRAVENOUS
  Filled 2015-06-02 (×3): qty 2

## 2015-06-02 MED ORDER — ROCURONIUM BROMIDE 100 MG/10ML IV SOLN
INTRAVENOUS | Status: DC | PRN
Start: 2015-06-02 — End: 2015-06-02
  Administered 2015-06-02: 50 mg via INTRAVENOUS

## 2015-06-02 MED ORDER — SODIUM CHLORIDE 0.9 % IV SOLN
250.0000 mL | INTRAVENOUS | Status: DC | PRN
  Administered 2015-06-02: 01:00:00 via INTRAVENOUS
  Administered 2015-06-03: 250 mL via INTRAVENOUS
  Administered 2015-06-10: 20 mL/h via INTRAVENOUS

## 2015-06-02 MED ORDER — FENTANYL CITRATE (PF) 100 MCG/2ML IJ SOLN
INTRAMUSCULAR | Status: AC
Start: 2015-06-02 — End: 2015-06-02
  Filled 2015-06-02: qty 2

## 2015-06-02 MED ORDER — PROPOFOL INFUSION 10 MG/ML OPTIME
INTRAVENOUS | Status: DC | PRN
  Administered 2015-06-02: 35 mg/kg/h via INTRAVENOUS

## 2015-06-02 MED ORDER — CHLORHEXIDINE GLUCONATE 0.12 % MT SOLN
15.0000 mL | Freq: Two times a day (BID) | OROMUCOSAL | Status: DC
  Administered 2015-06-02 – 2015-06-06 (×9): 15 mL via OROMUCOSAL
  Filled 2015-06-02 (×8): qty 15

## 2015-06-02 MED ORDER — MIDAZOLAM HCL 2 MG/2ML IJ SOLN
2.0000 mg | INTRAMUSCULAR | Status: DC | PRN
  Filled 2015-06-02 (×2): qty 2

## 2015-06-02 MED ORDER — FENTANYL CITRATE (PF) 100 MCG/2ML IJ SOLN: 100.0000 ug | INTRAMUSCULAR | Status: DC | PRN

## 2015-06-02 MED ORDER — INSULIN ASPART 100 UNIT/ML ~~LOC~~ SOLN: 2.0000 [IU] | SUBCUTANEOUS | Status: DC

## 2015-06-02 MED ORDER — DEXTROSE 5 % IV SOLN
0.0000 ug/min | INTRAVENOUS | Status: DC
  Administered 2015-06-02: 6 ug/min via INTRAVENOUS
  Administered 2015-06-03: 5 ug/min via INTRAVENOUS
  Administered 2015-06-03 – 2015-06-04 (×2): 4 ug/min via INTRAVENOUS
  Filled 2015-06-02 (×5): qty 4

## 2015-06-02 MED ORDER — SODIUM CHLORIDE 0.9 % IV SOLN
250.0000 [IU] | INTRAVENOUS | Status: DC | PRN
  Administered 2015-06-02: 6.3 [IU]/h via INTRAVENOUS
  Administered 2015-06-02: 6.5 [IU]/h via INTRAVENOUS

## 2015-06-02 MED ORDER — SODIUM CHLORIDE 0.9 % IV BOLUS (SEPSIS)
1000.0000 mL | Freq: Once | INTRAVENOUS | Status: AC
  Administered 2015-06-02: 1000 mL via INTRAVENOUS

## 2015-06-02 MED ORDER — PRO-STAT 64 PO LIQD
30.0000 mL | Freq: Every morning | ORAL | Status: DC
Start: 2015-06-02 — End: 2015-06-06
  Administered 2015-06-02 – 2015-06-03 (×2): 30 mL
  Filled 2015-06-02 (×5): qty 30

## 2015-06-02 MED ORDER — LIDOCAINE HCL (CARDIAC) 20 MG/ML IV SOLN
INTRAVENOUS | Status: DC | PRN
  Administered 2015-06-02: 50 mg via INTRAVENOUS

## 2015-06-02 MED ORDER — ESMOLOL HCL 10 MG/ML IV SOLN
INTRAVENOUS | Status: DC | PRN
  Administered 2015-06-02: 10 mg via INTRAVENOUS

## 2015-06-02 MED ORDER — NOREPINEPHRINE BITARTRATE 1 MG/ML IV SOLN
4000.0000 ug | INTRAVENOUS | Status: DC | PRN
  Administered 2015-06-02: 5 ug/min via INTRAVENOUS

## 2015-06-02 MED ORDER — PHENYLEPHRINE HCL 10 MG/ML IJ SOLN
INTRAMUSCULAR | Status: DC | PRN
  Administered 2015-06-02 (×2): 120 ug via INTRAVENOUS
  Administered 2015-06-02: 200 ug via INTRAVENOUS

## 2015-06-02 MED ORDER — SODIUM CHLORIDE 0.9 % IR SOLN
Status: DC | PRN
  Administered 2015-06-02: 3000 mL

## 2015-06-02 MED ORDER — EPHEDRINE SULFATE 50 MG/ML IJ SOLN
INTRAMUSCULAR | Status: DC | PRN
  Administered 2015-06-02 (×2): 10 mg via INTRAVENOUS

## 2015-06-02 MED ORDER — MIDAZOLAM HCL 2 MG/2ML IJ SOLN
2.0000 mg | INTRAMUSCULAR | Status: DC | PRN
  Administered 2015-06-02 – 2015-06-04 (×2): 2 mg via INTRAVENOUS
  Filled 2015-06-02: qty 2

## 2015-06-02 MED ORDER — SUCCINYLCHOLINE CHLORIDE 20 MG/ML IJ SOLN
INTRAMUSCULAR | Status: DC | PRN
  Administered 2015-06-02: 100 mg via INTRAVENOUS

## 2015-06-02 MED ORDER — SODIUM CHLORIDE 0.9 % IV SOLN
INTRAVENOUS | Status: DC
  Filled 2015-06-02: qty 2.5

## 2015-06-02 MED ORDER — SODIUM CHLORIDE 0.9 % IV SOLN
INTRAVENOUS | Status: DC
  Administered 2015-06-02 (×2): via INTRAVENOUS

## 2015-06-02 MED ORDER — EPHEDRINE SULFATE 50 MG/ML IJ SOLN
INTRAMUSCULAR | Status: AC
  Filled 2015-06-02: qty 4

## 2015-06-02 MED ORDER — MIDAZOLAM HCL 5 MG/5ML IJ SOLN
INTRAMUSCULAR | Status: DC | PRN
  Administered 2015-06-02: 2 mg via INTRAVENOUS

## 2015-06-02 MED ORDER — SODIUM BICARBONATE 8.4 % IV SOLN
INTRAVENOUS | Status: DC | PRN
  Administered 2015-06-02 (×2): 50 meq via INTRAVENOUS

## 2015-06-02 MED ORDER — CLINDAMYCIN PHOSPHATE 600 MG/50ML IV SOLN
600.0000 mg | Freq: Four times a day (QID) | INTRAVENOUS | Status: DC
  Administered 2015-06-02 – 2015-06-04 (×9): 600 mg via INTRAVENOUS
  Filled 2015-06-02 (×12): qty 50

## 2015-06-02 MED ORDER — INSULIN GLARGINE 100 UNIT/ML ~~LOC~~ SOLN
15.0000 [IU] | Freq: Every day | SUBCUTANEOUS | Status: DC
  Administered 2015-06-02 – 2015-06-03 (×2): 15 [IU] via SUBCUTANEOUS
  Filled 2015-06-02 (×2): qty 0.15

## 2015-06-02 MED ORDER — INSULIN ASPART 100 UNIT/ML ~~LOC~~ SOLN
0.0000 [IU] | SUBCUTANEOUS | Status: DC
  Administered 2015-06-02: 4 [IU] via SUBCUTANEOUS
  Administered 2015-06-02: 11 [IU] via SUBCUTANEOUS
  Administered 2015-06-02: 7 [IU] via SUBCUTANEOUS
  Administered 2015-06-03: 11 [IU] via SUBCUTANEOUS
  Administered 2015-06-03: 7 [IU] via SUBCUTANEOUS
  Administered 2015-06-03: 11 [IU] via SUBCUTANEOUS
  Administered 2015-06-03 (×3): 7 [IU] via SUBCUTANEOUS
  Administered 2015-06-04 (×2): 3 [IU] via SUBCUTANEOUS
  Administered 2015-06-04 (×2): 4 [IU] via SUBCUTANEOUS
  Administered 2015-06-04: 3 [IU] via SUBCUTANEOUS
  Administered 2015-06-04: 4 [IU] via SUBCUTANEOUS
  Administered 2015-06-04: 7 [IU] via SUBCUTANEOUS
  Administered 2015-06-05 (×2): 3 [IU] via SUBCUTANEOUS
  Administered 2015-06-05: 4 [IU] via SUBCUTANEOUS
  Administered 2015-06-05: 7 [IU] via SUBCUTANEOUS
  Administered 2015-06-06 (×2): 4 [IU] via SUBCUTANEOUS
  Administered 2015-06-06: 3 [IU] via SUBCUTANEOUS

## 2015-06-02 MED ORDER — VANCOMYCIN HCL 500 MG IV SOLR
500.0000 mg | Freq: Two times a day (BID) | INTRAVENOUS | Status: DC
  Administered 2015-06-02 – 2015-06-04 (×5): 500 mg via INTRAVENOUS
  Filled 2015-06-02 (×7): qty 500

## 2015-06-02 MED ORDER — SODIUM CHLORIDE 0.9 % IJ SOLN
INTRAMUSCULAR | Status: AC
  Filled 2015-06-02: qty 40

## 2015-06-02 MED ORDER — PROPOFOL 10 MG/ML IV BOLUS
INTRAVENOUS | Status: DC | PRN
  Administered 2015-06-02: 80 mg via INTRAVENOUS
  Administered 2015-06-02: 50 mg via INTRAVENOUS

## 2015-06-02 MED ORDER — PIPERACILLIN-TAZOBACTAM 3.375 G IVPB
3.3750 g | Freq: Three times a day (TID) | INTRAVENOUS | Status: DC
  Administered 2015-06-02 – 2015-06-04 (×7): 3.375 g via INTRAVENOUS
  Filled 2015-06-02 (×9): qty 50

## 2015-06-02 MED ORDER — PANTOPRAZOLE SODIUM 40 MG IV SOLR: 40.0000 mg | Freq: Every day | INTRAVENOUS | Status: DC

## 2015-06-02 MED ORDER — PANTOPRAZOLE SODIUM 40 MG IV SOLR
40.0000 mg | INTRAVENOUS | Status: DC
  Administered 2015-06-02: 40 mg via INTRAVENOUS
  Filled 2015-06-02: qty 40

## 2015-06-02 MED ORDER — VITAL AF 1.2 CAL PO LIQD
1000.0000 mL | ORAL | Status: DC
  Administered 2015-06-02 – 2015-06-03 (×4): 1000 mL
  Filled 2015-06-02 (×8): qty 1000

## 2015-06-02 SURGICAL SUPPLY — 90 items
BAG SPEC THK2 15X12 ZIP CLS (MISCELLANEOUS) ×1
BAG ZIPLOCK 12X15 (MISCELLANEOUS) ×3 IMPLANT
BANDAGE CONFORM 3  STR LF (GAUZE/BANDAGES/DRESSINGS) IMPLANT
BANDAGE ELASTIC 3 VELCRO ST LF (GAUZE/BANDAGES/DRESSINGS) IMPLANT
BANDAGE ESMARK 6X9 LF (GAUZE/BANDAGES/DRESSINGS) ×1 IMPLANT
BLADE SURG 10 STRL SS (BLADE) ×9 IMPLANT
BNDG CMPR 9X6 STRL LF SNTH (GAUZE/BANDAGES/DRESSINGS) ×1
BNDG COHESIVE 1X5 TAN STRL LF (GAUZE/BANDAGES/DRESSINGS) IMPLANT
BNDG COHESIVE 4X5 TAN STRL (GAUZE/BANDAGES/DRESSINGS) ×1 IMPLANT
BNDG COHESIVE 6X5 TAN STRL LF (GAUZE/BANDAGES/DRESSINGS) ×2 IMPLANT
BNDG ESMARK 6X9 LF (GAUZE/BANDAGES/DRESSINGS) ×3
BNDG GAUZE ELAST 4 BULKY (GAUZE/BANDAGES/DRESSINGS) ×12 IMPLANT
BNDG GAUZE STRTCH 6 (GAUZE/BANDAGES/DRESSINGS) ×9 IMPLANT
CORDS BIPOLAR (ELECTRODE) IMPLANT
COVER SURGICAL LIGHT HANDLE (MISCELLANEOUS) ×3 IMPLANT
CUFF TOURN SGL QUICK 34 (TOURNIQUET CUFF) ×3
CUFF TOURNIQUET SINGLE 24IN (TOURNIQUET CUFF) ×2 IMPLANT
CUFF TOURNIQUET SINGLE 34IN LL (TOURNIQUET CUFF) ×2 IMPLANT
CUFF TOURNIQUET SINGLE 44IN (TOURNIQUET CUFF) IMPLANT
CUFF TRNQT CYL 34X4X40X1 (TOURNIQUET CUFF) ×1 IMPLANT
DRAPE EXTREMITY BILATERAL (DRAPE) IMPLANT
DRAPE IMP U-DRAPE 54X76 (DRAPES) IMPLANT
DRAPE INCISE IOBAN 66X45 STRL (DRAPES) ×12 IMPLANT
DRAPE ORTHO SPLIT 77X108 STRL (DRAPES)
DRAPE SHEET LG 3/4 BI-LAMINATE (DRAPES) ×6 IMPLANT
DRAPE SURG 17X23 STRL (DRAPES) IMPLANT
DRAPE SURG ORHT 6 SPLT 77X108 (DRAPES) IMPLANT
DRAPE U-SHAPE 47X51 STRL (DRAPES) ×3 IMPLANT
DRSG VAC ATS MED SENSATRAC (GAUZE/BANDAGES/DRESSINGS) ×4 IMPLANT
DURAPREP 26ML APPLICATOR (WOUND CARE) ×1 IMPLANT
ELECT CAUTERY BLADE 6.4 (BLADE) ×3 IMPLANT
ELECT REM PT RETURN 9FT ADLT (ELECTROSURGICAL) ×3
ELECTRODE REM PT RTRN 9FT ADLT (ELECTROSURGICAL) ×1 IMPLANT
EVACUATOR 1/8 PVC DRAIN (DRAIN) IMPLANT
FACESHIELD WRAPAROUND (MASK) IMPLANT
FACESHIELD WRAPAROUND OR TEAM (MASK) IMPLANT
GAUZE SPONGE 4X4 12PLY STRL (GAUZE/BANDAGES/DRESSINGS) ×6 IMPLANT
GAUZE XEROFORM 1X8 LF (GAUZE/BANDAGES/DRESSINGS) ×1 IMPLANT
GAUZE XEROFORM 5X9 LF (GAUZE/BANDAGES/DRESSINGS) ×1 IMPLANT
GLOVE BIOGEL PI IND STRL 7.5 (GLOVE) ×1 IMPLANT
GLOVE BIOGEL PI INDICATOR 7.5 (GLOVE) ×2
GLOVE SURG SS PI 7.5 STRL IVOR (GLOVE) ×6 IMPLANT
GOWN STRL REUS W/ TWL LRG LVL3 (GOWN DISPOSABLE) ×1 IMPLANT
GOWN STRL REUS W/ TWL XL LVL3 (GOWN DISPOSABLE) ×1 IMPLANT
GOWN STRL REUS W/TWL LRG LVL3 (GOWN DISPOSABLE) ×3
GOWN STRL REUS W/TWL XL LVL3 (GOWN DISPOSABLE) ×6 IMPLANT
HANDPIECE INTERPULSE COAX TIP (DISPOSABLE)
KIT BASIN OR (CUSTOM PROCEDURE TRAY) ×3 IMPLANT
KIT ROOM TURNOVER OR (KITS) ×3 IMPLANT
MANIFOLD NEPTUNE II (INSTRUMENTS) ×3 IMPLANT
NS IRRIG 1000ML POUR BTL (IV SOLUTION) ×4 IMPLANT
PACK ORTHO EXTREMITY (CUSTOM PROCEDURE TRAY) ×3 IMPLANT
PACK TOTAL JOINT (CUSTOM PROCEDURE TRAY) ×3 IMPLANT
PAD ABD 8X10 STRL (GAUZE/BANDAGES/DRESSINGS) ×1 IMPLANT
PAD ARMBOARD 7.5X6 YLW CONV (MISCELLANEOUS) ×4 IMPLANT
PAD CAST 4YDX4 CTTN HI CHSV (CAST SUPPLIES) ×4 IMPLANT
PADDING CAST ABS 4INX4YD NS (CAST SUPPLIES)
PADDING CAST ABS COTTON 4X4 ST (CAST SUPPLIES) ×2 IMPLANT
PADDING CAST COTTON 4X4 STRL (CAST SUPPLIES) ×12
PADDING CAST COTTON 6X4 STRL (CAST SUPPLIES) ×1 IMPLANT
POSITIONER SURGICAL ARM (MISCELLANEOUS) ×3 IMPLANT
SET HNDPC FAN SPRY TIP SCT (DISPOSABLE) IMPLANT
SET IRRIG Y TYPE TUR BLADDER L (SET/KITS/TRAYS/PACK) ×3 IMPLANT
SOL PREP POV-IOD 4OZ 10% (MISCELLANEOUS) ×3 IMPLANT
SOL PREP PROV IODINE SCRUB 4OZ (MISCELLANEOUS) ×3 IMPLANT
SPONGE LAP 18X18 X RAY DECT (DISPOSABLE) ×6 IMPLANT
STAPLER VISISTAT 35W (STAPLE) ×3 IMPLANT
STOCKINETTE 8 INCH (MISCELLANEOUS) ×3 IMPLANT
STOCKINETTE IMPERVIOUS 9X36 MD (GAUZE/BANDAGES/DRESSINGS) ×1 IMPLANT
SUT ETHILON 2 0 PSLX (SUTURE) ×1 IMPLANT
SUT ETHILON 3 0 PS 1 (SUTURE) ×2 IMPLANT
SUT SILK 2 0 (SUTURE)
SUT SILK 2 0 SH CR/8 (SUTURE) ×3 IMPLANT
SUT SILK 2-0 18XBRD TIE 12 (SUTURE) IMPLANT
SUT VIC AB 2-0 CT1 27 (SUTURE) ×6
SUT VIC AB 2-0 CT1 36 (SUTURE) ×1 IMPLANT
SUT VIC AB 2-0 CT1 TAPERPNT 27 (SUTURE) ×2 IMPLANT
SUT VIC AB 2-0 FS1 27 (SUTURE) ×2 IMPLANT
SWAB COLLECTION DEVICE MRSA (MISCELLANEOUS) IMPLANT
SYR CONTROL 10ML LL (SYRINGE) IMPLANT
TOWEL OR 17X24 6PK STRL BLUE (TOWEL DISPOSABLE) ×3 IMPLANT
TOWEL OR 17X26 10 PK STRL BLUE (TOWEL DISPOSABLE) ×7 IMPLANT
TUBE ANAEROBIC SPECIMEN COL (MISCELLANEOUS) ×4 IMPLANT
TUBE CONNECTING 12'X1/4 (SUCTIONS)
TUBE CONNECTING 12X1/4 (SUCTIONS) ×1 IMPLANT
TUBE FEEDING 5FR 15 INCH (TUBING) IMPLANT
TUBING CYSTO DISP (UROLOGICAL SUPPLIES) ×3 IMPLANT
UNDERPAD 30X30 INCONTINENT (UNDERPADS AND DIAPERS) ×6 IMPLANT
WATER STERILE IRR 1500ML POUR (IV SOLUTION) ×3 IMPLANT
YANKAUER SUCT BULB TIP NO VENT (SUCTIONS) ×3 IMPLANT

## 2015-06-02 NOTE — Progress Notes (Signed)
We had another discussion with the patient and her husband with the interpretor and they now fully understand the seriousness of her condition.  We discussed 3 options of 1. Do no more surgery and treat with abx alone, which I do not feel will adequately treat her aggressive infection and still leave her in danger of losing her life; 2. Repeat I&D only - this is a slightly better option than 1 but still ultimately will be inadequate; 3. Transtibial amputation which will in my opinion and to a reasonable degree of certainty eradicate her infection.  He understands the risks of each option and wishes to proceed with transtibial amputation.  Consent was signed by husband in the presence of the patient and 3 RN's as well as myself.  We will proceed with transtibial amputation tomorrow.  [name], MD [place] 4:27 PM

## 2015-06-02 NOTE — Progress Notes (Addendum)
ANTIBIOTIC CONSULT NOTE - INITIAL  Pharmacy Consult for Vancomycin/Zosyn/Flagyl Indication: Diabetic foot wound infection  No Known Allergies  Patient Measurements: Weight: 132 lb 15 oz (60.3 kg)  Vital Signs: Temp: 96.2 F (35.7 C) (07/02 2325) Temp Source: Oral (07/02 2108) BP: 77/58 mmHg (07/02 2325) Pulse Rate: 100 (07/02 2150) Intake/Output from previous day:   Intake/Output from this shift:    Labs:  Recent Labs  06/01/15 2129 06/01/15 2155  WBC 13.4*  --   HGB 9.9* 11.9*  PLT 488*  --   CREATININE 2.15* 2.00*   Estimated Creatinine Clearance: 27.3 mL/min (by C-G formula based on Cr of 2). No results for input(s): VANCOTROUGH, VANCOPEAK, VANCORANDOM, GENTTROUGH, GENTPEAK, GENTRANDOM, TOBRATROUGH, TOBRAPEAK, TOBRARND, AMIKACINPEAK, AMIKACINTROU, AMIKACIN in the last 72 hours.   Microbiology: No results found for this or any previous visit (from the past 720 hour(s)).  Medical History: Past Medical History  Diagnosis Date  . Diabetes mellitus   . 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 Janie Morning), Cysto performed by Dr. Jonna Munro 12/2009 with evac of clots and bx and fulguration. // H/O stage 2 SCC of the vulva  . Anemia     2/2 blood loss  . Diabetes mellitus type 2, uncontrolled DX: 2001  . 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 Diabetic foot ulcers   . Cataract   . Diabetes mellitus without complication     Medications:  Scheduled:  . vancomycin  750 mg Intravenous Q24H   Infusions:  . dextrose 5 % and 0.45% NaCl    . insulin (NOVOLIN-R) infusion 3.4 Units/hr (06/01/15 2318)  . metronidazole 500 mg (06/01/15 2316)  . norepinephrine (LEVOPHED) Adult infusion 6 mcg/min (06/01/15 2333)  . piperacillin-tazobactam (ZOSYN)  IV      Assessment:  49 yr female with h/o diabetes presents with complaint of left foot pain.  Foot noted to be swollen with an open wound; gangrenous  Pharmacy consulted to dose Vancomycin, Zosyn & Flagyl for diabetic foot wound infection.    Blood and urine cultures ordered  CrCl ~ 27 ml/min  Goal of Therapy:  Vancomycin trough level 15-20 mcg/ml  Plan:  Measure antibiotic drug levels at steady state Follow up culture results   Zosyn 3.375gm IV q8h (each dose infused over 4 hrs)  Vancomycin 1gm IV x 1 followed by 750mg  IV q24h  Continue Flagyl 500mg  IV q8h (as previously ordered by MD)  Watch renal function and adjust regimens as needed  Jaiana Sheffer, Toribio Harbour, PharmD 06/02/2015,12:12 AM

## 2015-06-02 NOTE — Progress Notes (Signed)
Dr. Halford Chessman at bedside and updated [facility], pt's husband, via phone interpreter. All [name]'s questions answered by MD.

## 2015-06-02 NOTE — Anesthesia Postprocedure Evaluation (Signed)
  Anesthesia Post-op Note  Patient: [name]  Procedure(s) Performed: Procedure(s) (LRB): IRRIGATION AND DEBRIDEMENT EXTREMITY (Left)  Patient Location: ICU  Anesthesia Type: General  Level of Consciousness: unconscious, sedated  Airway and Oxygen Therapy: Intubated on ventilator, 100 % oxygen  Post-op Pain: none apparent  Post-op Assessment: Critically ill with vital sign instability, on levophed drip, with blood pressure and heart rate lability. Transported to ICU intubated per plan. Good urine output (3750 cc). She had episodes of SVT up to 185. Treated with Esmolol 10 mg IV successfully. Acidotic with low HCO3 level. Two syringes of HCO3 given with improvement of blood pressure. Insulin drip was increased per glucomander. Last glucose in high 300s.  Last Vitals:  HR 105, BP 110/65  Post-op Vital Signs: unstable   Complications: No apparent anesthesia complications

## 2015-06-02 NOTE — Progress Notes (Addendum)
[place] Progress Note Patient Name: Everette Dimauro DOB: 29-Mar-1966 MRN: [id number]   Date of Service  06/02/2015  HPI/Events of Note  1. D/w DR Erlinda Hong  Of ortho who is at bedside 12:19 AM and HAnnah APP - plan to go to to OR stat at [facility] for washout (patient refusing amputation). Post op will move to cone  eICU Interventions  1. CVL will have to wait until after OR per APP of ER 2. PCCM bedside eval will have to wait till post op 3. Chagne levophed to neo 4. Change in plan - keep in [place] ICU post op (not cone) and consider cone tx > 8am 06/02/2015 or 06/03/15      Intervention Category Evaluation Type: Other  Lorece Keach 06/02/2015, 12:18 AM

## 2015-06-02 NOTE — Progress Notes (Addendum)
   Subjective:  Patient is intubated and sedated, moderately responsive.  Objective:   VITALS:   Filed Vitals:   06/02/15 0700 06/02/15 0800 06/02/15 0900 06/02/15 1000  BP:      Pulse: 77 78 81 80  Temp:  98.2 F (36.8 C)    TempSrc:  Oral    Resp: 20 19 21 21   Height:      Weight:      SpO2: 100% 100% 100% 100%    VAC with good seal and suction Critically ill in ICU    Lab Results  Component Value Date   WBC 20.8* 06/02/2015   HGB 8.4* 06/02/2015   HCT 24.3* 06/02/2015   MCV 83.8 06/02/2015   PLT 555* 06/02/2015     Assessment/Plan:  Day of Surgery   - patient is critically ill on broad spectrum abx, pressors, insulin drip - infection is aggressive and is a threat to life - needs BKA - patient and husband are refusing BKA. They understand the implications of this decision. They are agreeable to repeat I&D and VAC tomorrow - I spent over 25 mintues with an interpreter impressing upon them the seriousness of the situation.  They fully understand and they still refused BKA. - NPO after midnight  Marianna Payment 06/02/2015, 11:24 AM [phone]  We recontacted the interpreter for surgical consent tomorrow. However when he learned of what we had performed last night in terms of the debridement and the fact that we debrided the majority of her foot in order to save her life he became upset and felt that we had betrayed his trust and the patient's trust. We discussed at length in fact over 30 minutes, that the patient was in medical extremity was in that I performed the minimum amount of surgery that I felt was needed to save her life. In the ER, we discussed that she truly needed a below the knee amputation but because they refused it [place] I felt that a irrigation and debridement was the next best thing although this was not optimal. The patient's husband then demanded that I no longer treat the patient and that he was considering the possibility of  taking her home despite the fact that she was in critical condition and on a ventilator and on pressors, insulin drip, broad-spectrum antibiotics.  I told the husband that I treated his wife last night like she was my family member and my #1 goal was to save her life. The patient's husband did not appear to be concerned about her dying but was more focused on the fact that her foot was partially amputated and was no longer going to be ambulatory.  I did continue to offer her my services to the patient and her husband but they refused any future treatment. I left them my phone number in case they change their mind. My recommendation is still that she needs a below the knee amputation.    [name], MD [place] 12:46 PM

## 2015-06-02 NOTE — Progress Notes (Signed)
CRITICAL VALUE ALERT  Critical value received:  Positive gram stain-abundant gram positive cocci in pairs, PMV. Date of notification: 06/02/15  Time of notification: 0330 Critical value read back:Yes.    Nurse who received alert:  c Aneliz Carbary  MD notified (1st page):  elink MD Time of first page: 0332 MD notified (2nd page):  Time of second page:  Responding MD:  elink MD Time MD responded:  [phone]

## 2015-06-02 NOTE — Transfer of Care (Signed)
Immediate Anesthesia Transfer of Care Note  Patient: [name]  Procedure(s) Performed: Procedure(s): IRRIGATION AND DEBRIDEMENT EXTREMITY (Left) Partial Left  AMPUTATION FOOT (Left)  Patient Location: ICU  Anesthesia Type:General  Level of Consciousness: Patient remains intubated per anesthesia plan  Airway & Oxygen Therapy: Patient remains intubated per anesthesia plan  Post-op Assessment: Report given to RN and Post -op Vital signs reviewed and stable  Post vital signs: Reviewed and stable  Last Vitals:  Filed Vitals:   06/02/15 0020  BP: 104/53  Pulse:   Temp: 36.1 C  Resp: 13    Complications: No apparent anesthesia complications

## 2015-06-02 NOTE — Anesthesia Preprocedure Evaluation (Addendum)
Anesthesia Evaluation  Patient identified by MRN, date of birth, ID band Patient awake    Reviewed: Allergy & Precautions, NPO status , Patient's Chart, lab work & pertinent test results  Airway Mallampati: II  TM Distance: >3 FB Neck ROM: Full    Dental no notable dental hx.    Pulmonary neg pulmonary ROS,  breath sounds clear to auscultation  Pulmonary exam normal       Cardiovascular Normal cardiovascular examRhythm:Regular Rate:Normal  On levophed support at 10 mcg. Blood pressure currently about 105/65  ECG reviewed.   Neuro/Psych negative neurological ROS  negative psych ROS   GI/Hepatic negative GI ROS, Neg liver ROS,   Endo/Other  diabetes, Type 2, Insulin DependentLast glucose 375 on insulin drip  Renal/GU Renal InsufficiencyRenal diseaseCr 2.00 K 4.9  negative genitourinary   Musculoskeletal negative musculoskeletal ROS (+)   Abdominal   Peds negative pediatric ROS (+)  Hematology  (+) anemia ,   Anesthesia Other Findings   Reproductive/Obstetrics negative OB ROS                         Anesthesia Physical Anesthesia Plan  ASA: III and emergent  Anesthesia Plan: General   Post-op Pain Management:    Induction: Intravenous  Airway Management Planned: Oral ETT  Additional Equipment:   Intra-op Plan:   Post-operative Plan: Post-operative intubation/ventilation  Informed Consent: I have reviewed the patients History and Physical, chart, labs and discussed the procedure including the risks, benefits and alternatives for the proposed anesthesia with the patient or authorized representative who has indicated his/her understanding and acceptance.   Dental advisory given  Plan Discussed with: CRNA  Anesthesia Plan Comments: (Request from Dr. Montez Morita to keep patient intubated as she will be transferred to [facility] ICU tonight postoperatively.  Discussed with patient through  interpreter Ellender Hose, RN. Questions answered.)       Anesthesia Quick Evaluation

## 2015-06-02 NOTE — Consult Note (Signed)
ORTHOPAEDIC CONSULTATION  REQUESTING PHYSICIAN: Raylene Miyamoto, MD  Chief Complaint: Left foot infection  HPI: [name] is a 49 y.o. female who presents with severe swelling, pain of left foot for the last few days.  Denies any trauma or injuries.  The foot has worsened in terms of swelling and pain.  Pain does not radiate.  Has had a plantar ulcer on foot for several months.  Patient presents in sepsis and hypotension.  Ortho consulted.  Past Medical History  Diagnosis Date  . Diabetes mellitus   . 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 Janie Morning), Cysto performed by Dr. Jonna Munro 12/2009 with evac of clots and bx and fulguration. // H/O stage 2 SCC of the vulva  . Anemia     2/2 blood loss  . Diabetes mellitus type 2, uncontrolled DX: 2001  . 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 Diabetic foot ulcers   . Cataract   . Diabetes mellitus without complication    Past Surgical History  Procedure Laterality Date  . Radical wide local excision of the vulva and right nguinal lymph node dissection  10/2003    Dr. Fermin Schwab  . Uterine dilatation and currettage    . Labial mass excision  07/2003    Dr. Ree Edman  . Left second toe mtp joint amputation  12/19/2010    Dr. Mayer Camel  . Irrigation and debridement of left foot with removal of left  01/29/2011    Dr. Mayer Camel  . Tubal ligation     History   Social History  . Marital Status: Married    Spouse Name: N/A  . Number of Children: N/A  . Years of Education: N/A   Social History Main Topics  . Smoking status: Never Smoker   . Smokeless tobacco: Never Used  . Alcohol Use: No  . Drug Use: No  . Sexual Activity: Yes   Other Topics Concern  . None   Social History Narrative   **  Merged History Encounter **       Family History  Problem Relation Age of Onset  . Diabetes Mother   . Diabetes Father   . Diabetes Brother    No Known Allergies Prior to Admission medications   Medication Sig Start Date End Date Taking? Authorizing Provider  insulin aspart (NOVOLOG) 100 UNIT/ML injection Inject 3 Units into the skin 3 (three) times daily before meals. Patient taking differently: Inject 18 Units into the skin 4 (four) times daily - after meals and at bedtime.  03/22/15  Yes Costin Karlyne Greenspan, MD  insulin glargine (LANTUS) 100 UNIT/ML injection Inject 0.24 mLs (24 Units total) into the skin at bedtime. Patient taking differently: Inject 16 Units into the skin at bedtime.  03/22/15  Yes Costin Karlyne Greenspan, MD  atorvastatin (LIPITOR) 20 MG tablet Take 1 tablet (20 mg total) by mouth daily. Patient not taking: Reported on 03/20/2015 12/26/14   Lorayne Marek, MD  Blood Glucose Monitoring Suppl (RELION CONFIRM GLUCOSE MONITOR) W/DEVICE KIT 1 kit by Does not apply route 3 (three) times daily. 03/22/15   Costin Karlyne Greenspan, MD  doxycycline (VIBRA-TABS) 100 MG tablet Take 1 tablet (100 mg total) by mouth every 12 (twelve) hours. Patient not taking: Reported on 06/01/2015  03/22/15   Costin Karlyne Greenspan, MD  glucose monitoring kit (FREESTYLE) monitoring kit 1 each by Does not apply route 4 (four) times daily - after meals and at bedtime. 1 month Diabetic Testing Supplies for QAC-QHS accuchecks. Patient not taking: Reported on 03/20/2015 12/20/14   Lorayne Marek, MD  oxyCODONE-acetaminophen (PERCOCET) 5-325 MG per tablet Take 1-2 tablets by mouth every 6 (six) hours as needed. Patient not taking: Reported on 03/20/2015 07/04/14   Wandra Arthurs, MD  Vitamin D, Ergocalciferol, (DRISDOL) 50000 UNITS CAPS capsule Take 1 capsule (50,000 Units total) by mouth every 7 (seven) days. Patient not taking: Reported on 03/20/2015 12/26/14   Lorayne Marek, MD   Dg Chest Port 1 View  06/01/2015   CLINICAL DATA:  Sepsis   EXAM: PORTABLE CHEST - 1 VIEW  COMPARISON:  06/27/2014  FINDINGS: A single AP portable view of the chest demonstrates no focal airspace consolidation or alveolar edema. The lungs are grossly clear. There is no large effusion or pneumothorax. Cardiac and mediastinal contours appear unremarkable.  IMPRESSION: No active disease.   Electronically Signed   By: Andreas Newport M.D.   On: 06/01/2015 23:00   Ap / Lateral X-ray Left Foot  06/01/2015   CLINICAL DATA:  Pain and swelling of the foot with discoloration and open wounds.  EXAM: LEFT FOOT - 2 VIEW  COMPARISON:  03/20/2015  FINDINGS: There is prior transmetatarsal amputation of the second digit.  There is extensive soft tissue gas throughout the forefoot, midfoot and hindfoot with marked soft tissue swelling. There is no radiopaque foreign body. There is no frank bony destruction.  IMPRESSION: Extensive soft tissue gas throughout the foot. Soft tissue gas appears to be tracking up the lower leg as well, at the margin of the AP image.   Electronically Signed   By: Andreas Newport M.D.   On: 06/01/2015 23:12    Positive ROS: All other systems have been reviewed and were otherwise negative with the exception of those mentioned in the HPI and as above.  Physical Exam: General: Alert Cardiovascular: No audible thrills Respiratory: No cyanosis, no use of accessory musculature GI: No organomegaly, abdomen is soft and non-tender Skin: Large blister on dorsum of foot Neurologic: Sensation decreased Psychiatric: Patient is competent for consent with normal mood and affect Lymphatic: No axillary or cervical lymphadenopathy  MUSCULOSKELETAL:  - foul smelling odor from left foot - purulent drainage from open wound in webspace - large dorsal blister - soft tissue crepitus - foot wwp - dry plantar ulcer  Assessment: Severe left foot infection with possible gas forming organism vs. Nec fasc  Plan: - needs emergent I&D as patient is septic - gas  gangrene vs. Possible nec fasc - patient refusing BKA even if it's life saving - will plan for I&D, VAC tonight and admission to ICU postop, [facility] later on sunday - will likely need repeat I&D's with possible BKA based on operative findings tonight - patient is critically ill  Thank you for the consult and the opportunity to see Ms. Boies  N. Eduard Roux, MD [place] 12:22 AM

## 2015-06-02 NOTE — Procedures (Signed)
Central Venous Catheter Insertion Procedure Note [name] [id number] 10/04/1966  Procedure: Insertion of Central Venous Catheter Indications: Assessment of intravascular volume, Drug and/or fluid administration and Frequent blood sampling  Procedure Details Consent: Risks of procedure as well as the alternatives and risks of each were explained to the (patient/caregiver).  Consent for procedure obtained. Time Out: Verified patient identification, verified procedure, site/side was marked, verified correct patient position, special equipment/implants available, medications/allergies/relevent history reviewed, required imaging and test results available.  Performed  Maximum sterile technique was used including antiseptics, cap, gloves, gown, hand hygiene, mask and sheet. Skin prep: Chlorhexidine; local anesthetic administered A antimicrobial bonded/coated triple lumen catheter was placed in the left subclavian vein using the Seldinger technique.  Evaluation Blood flow good Complications: No apparent complications Patient did tolerate procedure well. Chest X-ray ordered to verify placement.  CXR: pending.  [name] R. 06/02/2015, 6:20 AM

## 2015-06-02 NOTE — H&P (Signed)
PULMONARY / CRITICAL CARE MEDICINE   Name: [name] MRN: [id number] DOB: 1966-06-05    ADMISSION DATE:  06/01/2015  CHIEF COMPLAINT:  Septic Shock  INITIAL PRESENTATION: 24 F with poorly controlled DM and chronic LLE infection who presented with septic shock in setting of possible necrotizing fascitis.   STUDIES:  Gas in left foot on X-ray 7/2 Abundant gram (+) cocci in pairs on gram stain of L foot swab  SIGNIFICANT EVENTS: Left Chopart Amputation, 7/3  HISTORY OF PRESENT ILLNESS:  [name] is a 80 F with poorly controlled DM2, chart history of foot osteomyelitis, and L footvaginal Townsend and [name] s/p excision and definitive radiation therapy, respectively who presented to [facility] on 7/2 with foot pain and was found to be hypotensive. She was subsequently found to have gas gangrene of the foot and was taken emergently to surgery. She is currently intubated and sedated and unable to provide any history.   PAST MEDICAL HISTORY :   has a past medical history of Diabetes mellitus; [name]; Anemia; Diabetes mellitus type 2, uncontrolled (DX: 2001); Osteomyelitis; Diabetic foot ulcers; Cataract; and Diabetes mellitus without complication.  has past surgical history that includes Radical wide local excision of the vulva and right nguinal lymph node dissection (10/2003); Uterine dilatation and currettage; Labial mass excision (07/2003); Left second toe MTP joint amputation (12/19/2010); Irrigation and debridement of left foot with removal of left (01/29/2011); and Tubal ligation. Prior to Admission medications   Medication Sig Start Date End Date Taking? Authorizing Provider  insulin aspart (NOVOLOG) 100 UNIT/ML injection Inject 3 Units into the skin 3 (three) times daily before meals. Patient taking differently: Inject 18 Units into the skin 4 (four) times daily - after meals and at bedtime.  03/22/15  Yes Costin Karlyne Greenspan, MD  insulin glargine (LANTUS) 100  UNIT/ML injection Inject 0.24 mLs (24 Units total) into the skin at bedtime. Patient taking differently: Inject 16 Units into the skin at bedtime.  03/22/15  Yes Costin Karlyne Greenspan, MD  atorvastatin (LIPITOR) 20 MG tablet Take 1 tablet (20 mg total) by mouth daily. Patient not taking: Reported on 03/20/2015 12/26/14   Lorayne Marek, MD  Blood Glucose Monitoring Suppl (RELION CONFIRM GLUCOSE MONITOR) W/DEVICE KIT 1 kit by Does not apply route 3 (three) times daily. 03/22/15   Costin Karlyne Greenspan, MD  doxycycline (VIBRA-TABS) 100 MG tablet Take 1 tablet (100 mg total) by mouth every 12 (twelve) hours. Patient not taking: Reported on 06/01/2015 03/22/15   Caren Griffins, MD  glucose monitoring kit (FREESTYLE) monitoring kit 1 each by Does not apply route 4 (four) times daily - after meals and at bedtime. 1 month Diabetic Testing Supplies for QAC-QHS accuchecks. Patient not taking: Reported on 03/20/2015 12/20/14   Lorayne Marek, MD  oxyCODONE-acetaminophen (PERCOCET) 5-325 MG per tablet Take 1-2 tablets by mouth every 6 (six) hours as needed. Patient not taking: Reported on 03/20/2015 07/04/14   Wandra Arthurs, MD  Vitamin D, Ergocalciferol, (DRISDOL) 50000 UNITS CAPS capsule Take 1 capsule (50,000 Units total) by mouth every 7 (seven) days. Patient not taking: Reported on 03/20/2015 12/26/14   Lorayne Marek, MD   No Known Allergies  FAMILY HISTORY:  indicated that her mother is alive. She indicated that her father is deceased. She indicated that her sister is alive. She indicated that her brother is alive. She indicated that her daughter is deceased. She indicated that her son is alive.  SOCIAL HISTORY:  reports that she has never  smoked. She has never used smokeless tobacco. She reports that she does not drink alcohol or use illicit drugs.  REVIEW OF SYSTEMS:  Unable to obtain secondary to patient condition.   SUBJECTIVE:   VITAL SIGNS: Temp:  [96 F (35.6 C)-98.7 F (37.1 C)] 96.9 F (36.1 C) (07/03  0020) Pulse Rate:  [80-118] 80 (07/03 0522) Resp:  [10-22] 18 (07/03 0522) BP: (50-110)/(30-73) 94/36 mmHg (07/03 0400) SpO2:  [100 %] 100 % (07/03 0522) Arterial Line BP: (91-143)/(34-51) 112/40 mmHg (07/03 0522) FiO2 (%):  [30 %-100 %] 30 % (07/03 0425) Weight:  [130 lb 8.2 oz (59.2 kg)-132 lb 15 oz (60.3 kg)] 130 lb 8.2 oz (59.2 kg) (07/03 0500) HEMODYNAMICS:   VENTILATOR SETTINGS: Vent Mode:  [-] PRVC FiO2 (%):  [30 %-100 %] 30 % Set Rate:  [14 bmp-18 bmp] 18 bmp Vt Set:  [350 mL] 350 mL PEEP:  [5 cmH20] 5 cmH20 Plateau Pressure:  [10 cmH20-11 cmH20] 11 cmH20 INTAKE / OUTPUT:  Intake/Output Summary (Last 24 hours) at 06/02/15 8182 Last data filed at 06/02/15 0600  Gross per 24 hour  Intake 1505.65 ml  Output   3775 ml  Net -2269.35 ml    PHYSICAL EXAMINATION: General:  Sedated Neuro:  Sedated HEENT:  Sclera anicteric, conjunctiva pink, ETT present Cardiovascular:  RRR, nS1/S2, (-) MRG Lungs:  Coarse mechanical BS bilaterally Abdomen:  S/NT/ND/(+)BS Musculoskeletal:  (-) C/C/E on R, L wound vac present Skin:  Grossly intact  LABS:  CBC  Recent Labs Lab 06/01/15 2129 06/01/15 2155 06/02/15 0415  WBC 13.4*  --  20.8*  HGB 9.9* 11.9* 8.4*  HCT 29.0* 35.0* 24.3*  PLT 488*  --  555*   Coag's  Recent Labs Lab 06/02/15 0415  APTT 31  INR 1.43   BMET  Recent Labs Lab 06/01/15 2129 06/01/15 2155 06/02/15 0415  NA 120* 118* 143  K 5.0 4.9 3.4*  CL 85* 90* 112*  CO2 12*  --  19*  BUN 55* 52* 31*  CREATININE 2.15* 2.00* 1.09*  GLUCOSE 531* 546* 312*   Electrolytes  Recent Labs Lab 06/01/15 2129 06/02/15 0415  CALCIUM 8.2* 6.9*  MG  --  1.9  1.9  PHOS  --  3.2   Sepsis Markers  Recent Labs Lab 06/01/15 2145 06/02/15 0415  LATICACIDVEN 3.27* 1.5  PROCALCITON  --  14.82   ABG  Recent Labs Lab 06/02/15 0410  PHART 7.289*  PCO2ART 36.5  PO2ART 389*   Liver Enzymes  Recent Labs Lab 06/01/15 2129 06/02/15 0415  AST 28 29   ALT 22 22  ALKPHOS 193* 165*  BILITOT 1.6* 1.2  ALBUMIN 2.3* 1.8*   Cardiac Enzymes  Recent Labs Lab 06/02/15 0415  TROPONINI <0.03   Glucose  Recent Labs Lab 06/01/15 2124 06/01/15 2315 06/02/15 0018 06/02/15 0302  GLUCAP 461* 396* 375* 321*    Imaging Dg Chest Port 1 View  06/01/2015   CLINICAL DATA:  Sepsis  EXAM: PORTABLE CHEST - 1 VIEW  COMPARISON:  06/27/2014  FINDINGS: A single AP portable view of the chest demonstrates no focal airspace consolidation or alveolar edema. The lungs are grossly clear. There is no large effusion or pneumothorax. Cardiac and mediastinal contours appear unremarkable.  IMPRESSION: No active disease.   Electronically Signed   By: Andreas Newport M.D.   On: 06/01/2015 23:00   Ap / Lateral X-ray Left Foot  06/01/2015   CLINICAL DATA:  Pain and swelling of the foot with discoloration and  open wounds.  EXAM: LEFT FOOT - 2 VIEW  COMPARISON:  03/20/2015  FINDINGS: There is prior transmetatarsal amputation of the second digit.  There is extensive soft tissue gas throughout the forefoot, midfoot and hindfoot with marked soft tissue swelling. There is no radiopaque foreign body. There is no frank bony destruction.  IMPRESSION: Extensive soft tissue gas throughout the foot. Soft tissue gas appears to be tracking up the lower leg as well, at the margin of the AP image.   Electronically Signed   By: Andreas Newport M.D.   On: 06/01/2015 23:12     ASSESSMENT / PLAN:  PULMONARY OETT X-ray Deep, Will retract 2 cm and repeat X-ray A: Respiratory Failure: Left intubated given decompensation in OR.  P:   Lung protective ventilation VAP prevention Hold SBT until decision made RE return trip to [place] CVL L Subclavian Placed 7/3 A: Septic Shock: Unfortuanately, we likely have inadequate source control as the patient refused a BKA. She will likely get worse.  P:  Pressors to maintain MAP >= 65 Check Cortisol   RENAL A:    AKI: Hyponatremia: Unfortunately, patient rapidly corrected from admission. P:   Serial BMPs  GASTROINTESTINAL A:   No acute issue P:     HEMATOLOGIC A:   AOCD P:  Monitor  INFECTIOUS A:  Necrotizing Fascitis P:   F/u Ortho  BCx2, 7/2 UC, 7/2 Abx: Vanc/Zosyn/Clinda, start date 7/2, day 1/?  ENDOCRINE A:   DKA   P:   DKA protocol  NEUROLOGIC A:   P:   RASS goal: 0   FAMILY  - Updates:   - Inter-disciplinary family meet or Palliative Care meeting due by:  7/10 day 7    TODAY'S SUMMARY:   CRITICAL CARE: The patient is critically ill with multiple organ systems failure and requires high complexity decision making for assessment and support, frequent evaluation and titration of therapies, application of advanced monitoring technologies and extensive interpretation of multiple databases. Critical Care Time devoted to patient care services described in this note is 45 minutes.     Margarette Asal, MD Pulmonary and [place] Pager: [phone]  06/02/2015, 6:20 AM

## 2015-06-02 NOTE — Anesthesia Procedure Notes (Signed)
Procedure Name: Intubation Performed by: Gean Maidens Pre-anesthesia Checklist: Patient identified, Emergency Drugs available, Suction available, Patient being monitored and Timeout performed Patient Re-evaluated:Patient Re-evaluated prior to inductionOxygen Delivery Method: Circle system utilized Preoxygenation: Pre-oxygenation with 100% oxygen Intubation Type: IV induction Ventilation: Mask ventilation without difficulty Laryngoscope Size: Mac and 3 Grade View: Grade I Tube type: Oral Tube size: 7.5 (Hilow tube) mm Number of attempts: 1 Airway Equipment and Method: Stylet Placement Confirmation: ETT inserted through vocal cords under direct vision,  positive ETCO2,  CO2 detector and breath sounds checked- equal and bilateral Secured at: 21 cm Tube secured with: Tape Dental Injury: Teeth and Oropharynx as per pre-operative assessment

## 2015-06-02 NOTE — Progress Notes (Signed)
Initial Nutrition Assessment  INTERVENTION:  Initiate Vital AF 1.2 @ 15 ml/hr and increase by 10 ml every 4 hours to goal rate of 55 ml/hr.   30 ml Prostat daily.    Tube feeding regimen provides 1684 kcal (99% of needs), 114 grams of protein, and 1070 ml of H2O.   RD to continue to monitor  NUTRITION DIAGNOSIS:  Inadequate oral intake related to inability to eat as evidenced by NPO status.  GOAL:  Patient will meet greater than or equal to 90% of their needs  MONITOR:  Vent status, Labs, Weight trends, TF tolerance, Skin, I & O's  REASON FOR ASSESSMENT:  Consult, Ventilator Enteral/tube feeding initiation and management  ASSESSMENT: 49 yo female with hx of DM presented to ER with Lt foot pain x one week associated with open wound. Found to have septic shock and gangrene of Lt lower extremity.  Patient is currently intubated on ventilator support MV: 6.5 L/min Temp (24hrs), Avg:96.8 F (36 C), Min:96 F (35.6 C), Max:98.7 F (37.1 C)  Propofol: none  Per weight history, pt's weight has remained stable.  Labs reviewed: CBGs: 194-234 Low K Elevated BUN & Creatinine Mg/Phos WNL  Height:  Ht Readings from Last 1 Encounters:  06/02/15 4\' 11"  (1.499 m)    Weight:  Wt Readings from Last 1 Encounters:  06/02/15 130 lb 8.2 oz (59.2 kg)    Ideal Body Weight:  44.8 kg  Wt Readings from Last 10 Encounters:  06/02/15 130 lb 8.2 oz (59.2 kg)  03/21/15 133 lb (60.328 kg)  12/20/14 125 lb 6.4 oz (56.881 kg)  01/19/13 139 lb (63.05 kg)  10/20/12 138 lb 4.8 oz (62.732 kg)  10/05/12 136 lb (61.689 kg)  05/17/12 133 lb 4 oz (60.442 kg)  04/10/12 130 lb 4.8 oz (59.104 kg)  03/16/11 123 lb (55.792 kg)  02/19/11 122 lb 6.4 oz (55.52 kg)    BMI:  Body mass index is 26.35 kg/(m^2).  Estimated Nutritional Needs:  Kcal:  1700-1900  Protein:  85-100g  Fluid:  1.7L/day    Skin:  Wound (see comment) (foot ulcers/incisions)  Diet Order:  Diet NPO time  specified  EDUCATION NEEDS:  No education needs identified at this time   Intake/Output Summary (Last 24 hours) at 06/02/15 1034 Last data filed at 06/02/15 0937  Gross per 24 hour  Intake 2573.61 ml  Output   5950 ml  Net -3376.39 ml    Last BM:  None documented  Clayton Bibles, MS, RD, LDN Pager: [phone] After Hours Pager: [phone]

## 2015-06-02 NOTE — Consult Note (Signed)
WOC consulted ordered at the time of admission however patient with severe left foot infection and possible gas forming organism vs. Nec fasc. Per orthopedic consultation.  Taken emergently to the OR for debridement and placement of NPWT VAC, per notes needed BKA but patient and family are refusing at this time.  See Dr. Phoebe Sharps notes.  Will await new consult if needed for assist with VAC dressing changes.     Re consult if needed, will not follow at this time. Thanks  Neesha Langton [place], [place] [phone])

## 2015-06-02 NOTE — Progress Notes (Signed)
ANTIBIOTIC CONSULT NOTE - FOLLOW UP  Pharmacy Consult for Vancomycin, Zosyn Indication: Gas gangrene/necrotizing fasciitis  No Known Allergies  Patient Measurements: Height: 4\' 11"  (149.9 cm) Weight: 130 lb 8.2 oz (59.2 kg) IBW/kg (Calculated) : 43.2   Vital Signs: Temp: 98.2 F (36.8 C) (07/03 0800) Temp Source: Oral (07/03 0800) BP: 94/36 mmHg (07/03 0400) Pulse Rate: 80 (07/03 1000) Intake/Output from previous day: 07/02 0701 - 07/03 0700 In: 2103.2 [I.V.:1853.2; IV Piggyback:250] Out: 5700 [Urine:5675; Blood:25] Intake/Output from this shift: Total I/O In: 470.4 [I.V.:420.4; IV Piggyback:50] Out: 250 [Urine:250]  Labs:  Recent Labs  06/01/15 2129 06/01/15 2155 06/02/15 0415  WBC 13.4*  --  20.8*  HGB 9.9* 11.9* 8.4*  PLT 488*  --  555*  CREATININE 2.15* 2.00* 1.09*   Estimated Creatinine Clearance: 48.3 mL/min (by C-G formula based on Cr of 1.09). No results for input(s): VANCOTROUGH, VANCOPEAK, VANCORANDOM, GENTTROUGH, GENTPEAK, GENTRANDOM, TOBRATROUGH, TOBRAPEAK, TOBRARND, AMIKACINPEAK, AMIKACINTROU, AMIKACIN in the last 72 hours.   Microbiology: Recent Results (from the past 720 hour(s))  Blood Culture (routine x 2)     Status: None (Preliminary result)   Collection Time: 06/01/15  9:29 PM  Result Value Ref Range Status   Specimen Description   Final    BLOOD RIGHT ANTECUBITAL Performed at [place] [facility]  Final   Culture PENDING  Incomplete   Report Status PENDING  Incomplete  Blood Culture (routine x 2)     Status: None (Preliminary result)   Collection Time: 06/01/15  9:29 PM  Result Value Ref Range Status   Specimen Description   Final    BLOOD LEFT ANTECUBITAL Performed at [facility]    Special Requests BOTTLES DRAWN AEROBIC AND ANAEROBIC 5CC  Final   Culture PENDING  Incomplete   Report Status PENDING  Incomplete  Gram stain     Status: None   Collection Time:  06/02/15  1:30 AM  Result Value Ref Range Status   Specimen Description ABSCESS L FOOT  Final   Special Requests NONE  Final   Gram Stain   Final    ABUNDANT GRAM POSITIVE COCCI IN PAIRS FEW WBC PRESENT, PREDOMINANTLY PMN Gram Stain Report Called to,Read Back By and Verified With: Dollene Cleveland RN @ [phone] ON 06/02/15 BY C DAVIS    Report Status 06/02/2015 FINAL  Final  MRSA PCR Screening     Status: None   Collection Time: 06/02/15  3:19 AM  Result Value Ref Range Status   MRSA by PCR NEGATIVE NEGATIVE Final    Comment:        The GeneXpert MRSA Assay (FDA approved for NASAL specimens only), is one component of a comprehensive MRSA colonization surveillance program. It is not intended to diagnose MRSA infection nor to guide or monitor treatment for MRSA infections.     Anti-infectives    Start     Dose/Rate Route Frequency Ordered Stop   06/02/15 2200  vancomycin (VANCOCIN) IVPB 750 mg/150 ml premix     750 mg 150 mL/hr over 60 Minutes Intravenous Every 24 hours 06/02/15 0012     06/02/15 0700  clindamycin (CLEOCIN) IVPB 600 mg     600 mg 100 mL/hr over 30 Minutes Intravenous 4 times per day 06/02/15 0645     06/02/15 0600  piperacillin-tazobactam (ZOSYN) IVPB 3.375 g     3.375 g 12.5 mL/hr over 240 Minutes Intravenous Every 8 hours 06/02/15 0012  06/01/15 2200  vancomycin (VANCOCIN) IVPB 1000 mg/200 mL premix     1,000 mg 200 mL/hr over 60 Minutes Intravenous STAT 06/01/15 2135 06/01/15 2308   06/01/15 2145  piperacillin-tazobactam (ZOSYN) IVPB 3.375 g     3.375 g 100 mL/hr over 30 Minutes Intravenous STAT 06/01/15 2134 06/01/15 2211   06/01/15 2130  metroNIDAZOLE (FLAGYL) IVPB 500 mg  Status:  Discontinued     500 mg 100 mL/hr over 60 Minutes Intravenous Every 8 hours 06/01/15 2123 06/02/15 0647      Assessment: 49 yr female with h/o diabetes presents with complaint of left foot pain. Foot noted to be swollen with an open wound; gangrenous.  Pharmacy consulted to  dose Vancomycin, Zosyn for diabetic foot wound infection.  7/2 >> vanc >> 7/2 >> zosyn >>  7/2 >> flagyl >> 7/3 7/3 >> clinda >>  Temp: afebrile WBC: elevated, trending up Renal: SCr improved to wnl although still above baseline; CrCl 48 CG PCT elevated LA resolved 7/3  7/2 blood: IP 7/2 urine (cath): IP 7/3 abscess  - GPCs in pairs on stain - aerobic: IP - anaerobic: IP MRSA neg    Goal of Therapy:  Vancomycin trough level 15-20 mcg/ml  Eradication of infection Appropriate antibiotic dosing for indication and renal function  Plan:  Day 2 antibiotics  Will increase vancomycin to 500 mg IV q12 hr with improvement in renal function overnight.  Probably GAS given gangrene, but given severity of illness and polymicrobial nature of DM foot infections will optimize MRSA coverage. Measure vancomycin trough levels at steady state as indicated  Continue Zosyn 3.375 g IV given every 8 hrs by 4-hr infusion  Follow clinical course, renal function, culture results as available  Follow for de-escalation of antibiotics and LOT   Reuel Boom, PharmD Pager: [phone] 06/02/2015, 10:43 AM

## 2015-06-02 NOTE — Progress Notes (Signed)
[facility] Physician-Brief Progress Note Patient Name: [name] DOB: 1966-01-16 MRN: [id number]   Date of Service  06/02/2015  HPI/Events of Note  Request by DR Tomie China to tx x one unit prbc's pre-0p expecting some acute blood loss at surgery  eICU Interventions  tx x one unit prbcs / over ride nl protocol  Recent Labs Lab 06/01/15 2129 06/01/15 2155 06/02/15 0415  HGB 9.9* 11.9* 8.4*        Intervention Category Intermediate Interventions: Bleeding - evaluation and treatment with blood products  Christinia Gully 06/02/2015, 4:35 PM

## 2015-06-02 NOTE — Progress Notes (Signed)
Utilization Review Completed.Renn Stille T7/01/2015  

## 2015-06-02 NOTE — Progress Notes (Signed)
PULMONARY / CRITICAL CARE MEDICINE   Name: [name] MRN: [id number] DOB: July 24, 1966    ADMISSION DATE:  06/01/2015  REFERRING MD :  ER  CHIEF COMPLAINT:  Lt foot pain  BRIEF DESCRIPTION:  49 yo female with hx of DM presented to ER with Lt foot pain x one week associated with open wound.  Found to have septic shock and gangrene of Lt lower extremity.  STUDIES:  7/02 Xray Lt foot >> extensive soft tissue gas  SIGNIFICANT EVENTS: 7/02 Admit, ortho consulted 7/03 Lt Chopart amputation  SUBJECTIVE:  She remains on full vent support, insulin gtt, and pressors.  VITAL SIGNS: Temp:  [96 F (35.6 C)-98.7 F (37.1 C)] 96.9 F (36.1 C) (07/03 0020) Pulse Rate:  [76-118] 78 (07/03 0800) Resp:  [10-22] 19 (07/03 0800) BP: (50-110)/(30-73) 94/36 mmHg (07/03 0400) SpO2:  [100 %] 100 % (07/03 0800) Arterial Line BP: (91-143)/(34-59) 113/44 mmHg (07/03 0800) FiO2 (%):  [30 %-100 %] 30 % (07/03 0800) Weight:  [130 lb 8.2 oz (59.2 kg)-132 lb 15 oz (60.3 kg)] 130 lb 8.2 oz (59.2 kg) (07/03 0500) HEMODYNAMICS:   VENTILATOR SETTINGS: Vent Mode:  [-] PRVC FiO2 (%):  [30 %-100 %] 30 % Set Rate:  [14 bmp-18 bmp] 18 bmp Vt Set:  [350 mL] 350 mL PEEP:  [5 cmH20] 5 cmH20 Plateau Pressure:  [10 RPR94-58 cmH20] 12 cmH20 INTAKE / OUTPUT:  Intake/Output Summary (Last 24 hours) at 06/02/15 0846 Last data filed at 06/02/15 0800  Gross per 24 hour  Intake 2425.34 ml  Output   5950 ml  Net -3524.66 ml    PHYSICAL EXAMINATION: General: ill appearing Neuro:  RASS -1, moves extremities with stimulation HEENT:  ETT in place Cardiovascular:  Regular, no murmur Lungs:  No wheeze Abdomen:  Soft, non tender Musculoskeletal: 1+ edema Skin:  Lt foot wound vac in place with foul odor  LABS:  CBC  Recent Labs Lab 06/01/15 2129 06/01/15 2155 06/02/15 0415  WBC 13.4*  --  20.8*  HGB 9.9* 11.9* 8.4*  HCT 29.0* 35.0* 24.3*  PLT 488*  --  555*   Coag's  Recent Labs Lab  06/02/15 0415  APTT 31  INR 1.43   BMET  Recent Labs Lab 06/01/15 2129 06/01/15 2155 06/02/15 0415  NA 120* 118* 143  K 5.0 4.9 3.4*  CL 85* 90* 112*  CO2 12*  --  19*  BUN 55* 52* 31*  CREATININE 2.15* 2.00* 1.09*  GLUCOSE 531* 546* 312*   Electrolytes  Recent Labs Lab 06/01/15 2129 06/02/15 0415  CALCIUM 8.2* 6.9*  MG  --  1.9  1.9  PHOS  --  3.2   Sepsis Markers  Recent Labs Lab 06/01/15 2145 06/02/15 0415  LATICACIDVEN 3.27* 1.5  PROCALCITON  --  14.82   ABG  Recent Labs Lab 06/02/15 0410 06/02/15 0730  PHART 7.289* 7.297*  PCO2ART 36.5 41.8  PO2ART 389* 132*   Liver Enzymes  Recent Labs Lab 06/01/15 2129 06/02/15 0415  AST 28 29  ALT 22 22  ALKPHOS 193* 165*  BILITOT 1.6* 1.2  ALBUMIN 2.3* 1.8*   Cardiac Enzymes  Recent Labs Lab 06/02/15 0415  TROPONINI <0.03   Glucose  Recent Labs Lab 06/01/15 2124 06/01/15 2315 06/02/15 0018 06/02/15 0302 06/02/15 0415  GLUCAP 461* 396* 375* 321* 327*    Imaging Dg Chest Port 1 View  06/02/2015   CLINICAL DATA:  Septic shock.  History of vaginal cancer.  EXAM: PORTABLE CHEST - 1  VIEW  COMPARISON:  06/02/2015  FINDINGS: Support devices are in stable position. Heart is borderline in size. No confluent airspace opacities or effusions. No acute bony abnormality.  IMPRESSION: No acute findings.  No change.  Stable support devices.   Electronically Signed   By: Rolm Baptise M.D.   On: 06/02/2015 08:06   Dg Chest Port 1 View  06/02/2015   CLINICAL DATA:  Central line placement  EXAM: PORTABLE CHEST - 1 VIEW  COMPARISON:  06/01/2015  FINDINGS: The endotracheal tube is 2 cm above the carina. The nasogastric tube extends into the stomach. There is a left subclavian central line extending to the low SVC. There is no pneumothorax. The lungs are clear. There is no large effusion.  IMPRESSION: Support equipment appears satisfactorily positioned.  The lungs are grossly clear.  No pneumothorax.    Electronically Signed   By: Andreas Newport M.D.   On: 06/02/2015 06:39   Dg Chest Port 1 View  06/01/2015   CLINICAL DATA:  Sepsis  EXAM: PORTABLE CHEST - 1 VIEW  COMPARISON:  06/27/2014  FINDINGS: A single AP portable view of the chest demonstrates no focal airspace consolidation or alveolar edema. The lungs are grossly clear. There is no large effusion or pneumothorax. Cardiac and mediastinal contours appear unremarkable.  IMPRESSION: No active disease.   Electronically Signed   By: Andreas Newport M.D.   On: 06/01/2015 23:00   Ap / Lateral X-ray Left Foot  06/01/2015   CLINICAL DATA:  Pain and swelling of the foot with discoloration and open wounds.  EXAM: LEFT FOOT - 2 VIEW  COMPARISON:  03/20/2015  FINDINGS: There is prior transmetatarsal amputation of the second digit.  There is extensive soft tissue gas throughout the forefoot, midfoot and hindfoot with marked soft tissue swelling. There is no radiopaque foreign body. There is no frank bony destruction.  IMPRESSION: Extensive soft tissue gas throughout the foot. Soft tissue gas appears to be tracking up the lower leg as well, at the margin of the AP image.   Electronically Signed   By: Andreas Newport M.D.   On: 06/01/2015 23:12     ASSESSMENT / PLAN:  PULMONARY ETT 7/03 >> A: Acute respiratory failure 2nd to septic shock and acidosis. P:   Full vent support F/u CXR, ABG  CARDIOVASCULAR Lt [place] CVL 7/03 >> A:  Septic shock. Hx of HTN, HLD. P:  Goal CVP 8 to 12 Pressors to keep MAP > 65 F/u cortisol  RENAL A:   AKI 2nd to septic shock. Lactic acidosis. P:   Volume resuscitate Monitor renal fx, urine outpt F/u lactic acid  GASTROINTESTINAL A:   Nutrition. P:   Tube feeds while on vent Protonix for SUP  HEMATOLOGIC A:   Anemia of critical illness and chronic disease. P:  F/u CBC SCD for DVT prophylaxis >> add SQ heparin when okay with surgery  INFECTIOUS A:   Septic shock 2nd to Lt lower extremity  gangrene s/p amputation. P:   Post-op/wound care per ortho, wound care team Day 2 cleocin, zosyn, vancomycin Contact isolation due to concern for necrotizing fasciitis  Urine 7/02 >> Blood 7/02 .> Blood 7/03 >> Wound 7/03 >>  ENDOCRINE A:   DM type II. P:   SSI  NEUROLOGIC A:   Acute encephalopathy 2nd to septic shock. P:   RASS goal: -1  Updated pt's husband at bedside with assistance of translator.  CC time 40 minutes.  Chesley Mires, MD [place]  06/02/2015, 9:19 AM Pager:  [phone] After 3pm call: [phone]

## 2015-06-02 NOTE — Clinical Social Work Note (Signed)
CSW received a call from RN that pt's husband was upset about his wife's foot being cut off  CSW met with pt's husband at bedside and utilized an interpreter for [place] speaking to aide in communication as pt and her husband speak little english  CSW encouraged pt's husband to discuss his thoughts, feelings and concerns.    Pt's husband discussed being upset that he came into her room today and saw her foot was gone.  He was not aware this had taken place  RN was able to discuss pt's medical issues and the reason his wife's foot was amputated.  Per RN pt is very ill and could still die from the infection/gangrine she had in her foot  Pt's husband was able to understand the importance of pt's foot removal  CSW provided information for pt's husband regarding what services can be provided for pt as her needs occur and he was appreciative of this information.  CSW will continue to follow pt until discharge  .Dede Query, LCSW [facility] Clinical Social Worker - Weekend Coverage cell #: [phone]

## 2015-06-02 NOTE — Op Note (Signed)
   Date of surgery: 06/02/2015  Preoperative diagnoses: Left foot gas gangrene  Postoperative diagnosis: Same  Procedure:  1. Left Chopart amputation 2. Application of negative wound therapy greater than 50 cm  Operative findings: Severe left foot gangrene of the soft tissue, bone, skin, tendon.  Surgeon: Eduard Roux, M.D.  Anesthesia: Gen.  Estimated blood loss: 297 mL  Complications: None  Specimens: 2 cultures  Condition to ICU: Critical  Indications for procedure: The patient is a 49 year old female with poorly controlled diabetes who presented tonight with a severe infection of her left foot that was concerning for gas gangrene. The recommendation was for extensive debridement with possible below the knee amputation. However the patient refused a below the knee amputation. She was made aware of the risks of refusing amputation including the possibility of septic shock and death. Therefore we agreed to proceed with extensive debridement of the infection and preservation of as much of her foot as possible while also treating the infection. The consent was signed.  Description of procedure: The  patient was identified in the holding area. Operative site was marked by the surgeon and confirmed with the patient. She is brought back to the operating room. She was placed supine on the table. General anesthesia was induced. Nonsterile tourniquet was placed on the upper left thigh. A timeout was performed. Preoperative anti-biotics were held in anticipation of cultures.  We made a longitudinal incision  over the fluctuant area. There was return of a large amount of frank pus. Two aerobic and two anaerobic cultures were obtained.  We then performed sharp excisional debridement of the infection.  Once we inspected the subcutaneous tissues, it was obvious the infection was widespread and had caused gangrene of the subcutaneous tissue, muscle, tendon, midfoot and forefoot bones.  The tissues did  not bleed.  There was a very pungent foul smelling odor emanating from the wound bed.  The debridement was carried back to the chopart joint.  Therefore, a chopart amputation was performed.  The wound was then thoroughly irrigated with normal saline.  A wound vac was placed and the suction turned to -125 mmHg.  The patient became more ill during the surgery with lability of her heart rate, acidosis, hypotension.  She was left intubated and transferred to the ICU.   Disposition:  The patient is critically ill and requires a below the knee amputation for survival.  Currently, the bulk of the infection has been debrided and the infectious burdened has been significantly decreased.  She will need aggressive critical care and broad spectrum IV antibiotics until the patient and husband can agree to proceed with the amputation.  The husband understands how serious her condition is.  [name], MD [place] 2:22 AM

## 2015-06-03 ENCOUNTER — Encounter (HOSPITAL_COMMUNITY)
Admission: EM | Disposition: A | Discharge status: Rehabilitation Facility | Payer: Self-pay | Source: Home / Self Care | Attending: Internal Medicine

## 2015-06-03 ENCOUNTER — Inpatient Hospital Stay (HOSPITAL_COMMUNITY): Payer: Medicaid Other

## 2015-06-03 ENCOUNTER — Inpatient Hospital Stay (HOSPITAL_COMMUNITY): Payer: Medicaid Other | Admitting: Anesthesiology

## 2015-06-03 ENCOUNTER — Other Ambulatory Visit: Payer: Self-pay

## 2015-06-03 HISTORY — PX: AMPUTATION: SHX166

## 2015-06-03 LAB — GLUCOSE, CAPILLARY
GLUCOSE-CAPILLARY: 235 mg/dL — AB (ref 65–99)
GLUCOSE-CAPILLARY: 267 mg/dL — AB (ref 65–99)
GLUCOSE-CAPILLARY: 176 mg/dL — AB (ref 65–99)
GLUCOSE-CAPILLARY: 211 mg/dL — AB (ref 65–99)
Glucose-Capillary: 216 mg/dL — ABNORMAL HIGH (ref 65–99)
Glucose-Capillary: 229 mg/dL — ABNORMAL HIGH (ref 65–99)
Glucose-Capillary: 258 mg/dL — ABNORMAL HIGH (ref 65–99)
Glucose-Capillary: 262 mg/dL — ABNORMAL HIGH (ref 65–99)

## 2015-06-03 LAB — BLOOD GAS, ARTERIAL
ACID-BASE EXCESS: 1 mmol/L (ref 0.0–2.0)
BICARBONATE: 25.4 meq/L — AB (ref 20.0–24.0)
DRAWN BY: 308601
FIO2: 0.3 %
MECHVT: 350 mL
O2 SAT: 96 %
PEEP/CPAP: 5 cmH2O
PH ART: 7.401 (ref 7.350–7.450)
Patient temperature: 37
RATE: 18 resp/min
TCO2: 23.7 mmol/L (ref 0–100)
pCO2 arterial: 41.7 mmHg (ref 35.0–45.0)
pO2, Arterial: 84.3 mmHg (ref 80.0–100.0)

## 2015-06-03 LAB — POCT I-STAT 7, (LYTES, BLD GAS, ICA,H+H)
Acid-Base Excess: 4 mmol/L — ABNORMAL HIGH (ref 0.0–2.0)
Acid-base deficit: 17 mmol/L — ABNORMAL HIGH (ref 0.0–2.0)
BICARBONATE: 26.9 meq/L — AB (ref 20.0–24.0)
Bicarbonate: 11.8 mEq/L — ABNORMAL LOW (ref 20.0–24.0)
Calcium, Ion: 1.07 mmol/L — ABNORMAL LOW (ref 1.12–1.23)
Calcium, Ion: 0.96 mmol/L — ABNORMAL LOW (ref 1.12–1.23)
HCT: 28 % — ABNORMAL LOW (ref 36.0–46.0)
HCT: 30 % — ABNORMAL LOW (ref 36.0–46.0)
HEMOGLOBIN: 9.5 g/dL — AB (ref 12.0–15.0)
Hemoglobin: 10.2 g/dL — ABNORMAL LOW (ref 12.0–15.0)
O2 SAT: 100 %
O2 Saturation: 100 %
PCO2 ART: 36.1 mmHg (ref 35.0–45.0)
PH ART: 7.085 — AB (ref 7.350–7.450)
PH ART: 7.486 — AB (ref 7.350–7.450)
PO2 ART: 395 mmHg — AB (ref 80.0–100.0)
Patient temperature: 38.6
Potassium: 3.7 mmol/L (ref 3.5–5.1)
Potassium: 3.5 mmol/L (ref 3.5–5.1)
SODIUM: 137 mmol/L (ref 135–145)
Sodium: 150 mmol/L — ABNORMAL HIGH (ref 135–145)
TCO2: 13 mmol/L (ref 0–100)
TCO2: 28 mmol/L (ref 0–100)
pCO2 arterial: 39.2 mmHg (ref 35.0–45.0)
pO2, Arterial: 220 mmHg — ABNORMAL HIGH (ref 80.0–100.0)

## 2015-06-03 LAB — BASIC METABOLIC PANEL
ANION GAP: 8 (ref 5–15)
ANION GAP: 7 (ref 5–15)
BUN: 14 mg/dL (ref 6–20)
BUN: 12 mg/dL (ref 6–20)
CHLORIDE: 114 mmol/L — AB (ref 101–111)
CHLORIDE: 116 mmol/L — AB (ref 101–111)
CO2: 27 mmol/L (ref 22–32)
CO2: 29 mmol/L (ref 22–32)
CREATININE: 0.79 mg/dL (ref 0.44–1.00)
Calcium: 7.1 mg/dL — ABNORMAL LOW (ref 8.9–10.3)
Calcium: 6.9 mg/dL — ABNORMAL LOW (ref 8.9–10.3)
Creatinine, Ser: 0.77 mg/dL (ref 0.44–1.00)
GFR calc non Af Amer: >60 mL/min (ref >60)
GFR calc non Af Amer: >60 mL/min (ref >60)
GLUCOSE: 265 mg/dL — AB (ref 65–99)
Glucose, Bld: 310 mg/dL — ABNORMAL HIGH (ref 65–99)
POTASSIUM: 3.1 mmol/L — AB (ref 3.5–5.1)
Potassium: 2.6 mmol/L — CL (ref 3.5–5.1)
SODIUM: 149 mmol/L — AB (ref 135–145)
SODIUM: 152 mmol/L — AB (ref 135–145)

## 2015-06-03 LAB — CBC
HEMATOCRIT: 27.8 % — AB (ref 36.0–46.0)
HEMOGLOBIN: 9.8 g/dL — AB (ref 12.0–15.0)
MCH: 29.5 pg (ref 26.0–34.0)
MCHC: 35.3 g/dL (ref 30.0–36.0)
MCV: 83.7 fL (ref 78.0–100.0)
PLATELETS: 428 10*3/uL — AB (ref 150–400)
RBC: 3.32 MIL/uL — ABNORMAL LOW (ref 3.87–5.11)
RDW: 13.4 % (ref 11.5–15.5)
WBC: 12.4 10*3/uL — AB (ref 4.0–10.5)

## 2015-06-03 LAB — POCT I-STAT 4, (NA,K, GLUC, HGB,HCT)
Glucose, Bld: 386 mg/dL — ABNORMAL HIGH (ref 65–99)
HCT: 29 % — ABNORMAL LOW (ref 36.0–46.0)
HEMOGLOBIN: 9.9 g/dL — AB (ref 12.0–15.0)
POTASSIUM: 3.6 mmol/L (ref 3.5–5.1)
Sodium: 138 mmol/L (ref 135–145)

## 2015-06-03 LAB — HEPATIC FUNCTION PANEL
ALT: 18 U/L (ref 14–54)
AST: 16 U/L (ref 15–41)
Albumin: 1.9 g/dL — ABNORMAL LOW (ref 3.5–5.0)
Alkaline Phosphatase: 140 U/L — ABNORMAL HIGH (ref 38–126)
Bilirubin, Direct: 0.1 mg/dL (ref 0.1–0.5)
Indirect Bilirubin: 1 mg/dL — ABNORMAL HIGH (ref 0.3–0.9)
Total Bilirubin: 1.1 mg/dL (ref 0.3–1.2)
Total Protein: 6 g/dL — ABNORMAL LOW (ref 6.5–8.1)

## 2015-06-03 LAB — MAGNESIUM
Magnesium: 1.7 mg/dL (ref 1.7–2.4)
Magnesium: 2 mg/dL (ref 1.7–2.4)

## 2015-06-03 LAB — TYPE AND SCREEN
ABO/RH(D): O POS
Antibody Screen: NEGATIVE
Unit division: 0

## 2015-06-03 LAB — PHOSPHORUS: Phosphorus: 2.7 mg/dL (ref 2.5–4.6)

## 2015-06-03 LAB — LACTIC ACID, PLASMA: LACTIC ACID, VENOUS: 1.3 mmol/L (ref 0.5–2.0)

## 2015-06-03 SURGERY — AMPUTATION BELOW KNEE
Anesthesia: General | Laterality: Left

## 2015-06-03 MED ORDER — FENTANYL CITRATE (PF) 250 MCG/5ML IJ SOLN
INTRAMUSCULAR | Status: AC
  Filled 2015-06-03: qty 5

## 2015-06-03 MED ORDER — VANCOMYCIN HCL 1000 MG IV SOLR
INTRAVENOUS | Status: DC | PRN
  Administered 2015-06-03: 1000 mg via TOPICAL

## 2015-06-03 MED ORDER — PNEUMOCOCCAL VAC POLYVALENT 25 MCG/0.5ML IJ INJ
0.5000 mL | INJECTION | INTRAMUSCULAR | Status: AC
  Administered 2015-06-07: 0.5 mL via INTRAMUSCULAR
  Filled 2015-06-03 (×3): qty 0.5

## 2015-06-03 MED ORDER — SODIUM CHLORIDE 0.9 % IV BOLUS (SEPSIS)
500.0000 mL | Freq: Once | INTRAVENOUS | Status: AC
  Administered 2015-06-03: 500 mL via INTRAVENOUS

## 2015-06-03 MED ORDER — ACETAMINOPHEN 650 MG RE SUPP
650.0000 mg | Freq: Four times a day (QID) | RECTAL | Status: DC | PRN
  Administered 2015-06-03 (×2): 650 mg via RECTAL
  Filled 2015-06-03 (×2): qty 1

## 2015-06-03 MED ORDER — INSULIN GLARGINE 100 UNIT/ML ~~LOC~~ SOLN
25.0000 [IU] | Freq: Every day | SUBCUTANEOUS | Status: DC
  Administered 2015-06-04: 25 [IU] via SUBCUTANEOUS
  Filled 2015-06-03: qty 0.25

## 2015-06-03 MED ORDER — VANCOMYCIN HCL 1000 MG IV SOLR
INTRAVENOUS | Status: AC
  Filled 2015-06-03: qty 1000

## 2015-06-03 MED ORDER — FENTANYL CITRATE (PF) 100 MCG/2ML IJ SOLN
INTRAMUSCULAR | Status: DC | PRN
  Administered 2015-06-03: 100 ug via INTRAVENOUS
  Administered 2015-06-03 (×2): 50 ug via INTRAVENOUS

## 2015-06-03 MED ORDER — POTASSIUM CHLORIDE 10 MEQ/50ML IV SOLN
10.0000 meq | INTRAVENOUS | Status: AC
  Administered 2015-06-03 (×6): 10 meq via INTRAVENOUS
  Filled 2015-06-03 (×6): qty 50

## 2015-06-03 MED ORDER — EPHEDRINE SULFATE 50 MG/ML IJ SOLN
INTRAMUSCULAR | Status: AC
  Filled 2015-06-03: qty 1

## 2015-06-03 MED ORDER — PROPOFOL 10 MG/ML IV BOLUS
INTRAVENOUS | Status: AC
  Filled 2015-06-03: qty 20

## 2015-06-03 MED ORDER — MAGNESIUM SULFATE IN D5W 10-5 MG/ML-% IV SOLN
1.0000 g | Freq: Once | INTRAVENOUS | Status: AC
  Administered 2015-06-03: 1 g via INTRAVENOUS
  Filled 2015-06-03: qty 100

## 2015-06-03 MED ORDER — PROPOFOL 10 MG/ML IV BOLUS
INTRAVENOUS | Status: DC | PRN
  Administered 2015-06-03: 30 mg via INTRAVENOUS

## 2015-06-03 MED ORDER — SODIUM CHLORIDE 0.9 % IR SOLN
Status: DC | PRN
  Administered 2015-06-03 (×2): 3000 mL

## 2015-06-03 MED ORDER — FLUCONAZOLE IN SODIUM CHLORIDE 200-0.9 MG/100ML-% IV SOLN
150.0000 mg | Freq: Once | INTRAVENOUS | Status: AC
  Administered 2015-06-03: 150 mg via INTRAVENOUS
  Filled 2015-06-03: qty 75

## 2015-06-03 MED ORDER — CLOTRIMAZOLE 1 % VA CREA
1.0000 | TOPICAL_CREAM | Freq: Every day | VAGINAL | Status: AC
  Administered 2015-06-03 – 2015-06-09 (×7): 1 via VAGINAL
  Filled 2015-06-03 (×2): qty 45

## 2015-06-03 MED ORDER — LACTATED RINGERS IV SOLN
INTRAVENOUS | Status: DC | PRN
  Administered 2015-06-03: 08:00:00 via INTRAVENOUS

## 2015-06-03 MED ORDER — ROCURONIUM BROMIDE 100 MG/10ML IV SOLN
INTRAVENOUS | Status: AC
  Filled 2015-06-03: qty 1

## 2015-06-03 MED ORDER — SODIUM CHLORIDE 0.9 % IJ SOLN
INTRAMUSCULAR | Status: AC
  Filled 2015-06-03: qty 10

## 2015-06-03 MED ORDER — POTASSIUM CHLORIDE 20 MEQ/15ML (10%) PO SOLN
40.0000 meq | Freq: Once | ORAL | Status: AC
  Administered 2015-06-03: 40 meq
  Filled 2015-06-03 (×2): qty 30

## 2015-06-03 SURGICAL SUPPLY — 50 items
BAG SPEC THK2 15X12 ZIP CLS (MISCELLANEOUS) ×1
BAG ZIPLOCK 12X15 (MISCELLANEOUS) ×3 IMPLANT
BANDAGE ELASTIC 6 VELCRO ST LF (GAUZE/BANDAGES/DRESSINGS) ×2 IMPLANT
BANDAGE ESMARK 6X9 LF (GAUZE/BANDAGES/DRESSINGS) ×1 IMPLANT
BLADE SAW SGTL 18X1.27X75 (BLADE) ×1 IMPLANT
BLADE SAW SGTL 18X1.27X75MM (BLADE) ×1
BNDG CMPR 9X6 STRL LF SNTH (GAUZE/BANDAGES/DRESSINGS)
BNDG COHESIVE 4X5 TAN STRL (GAUZE/BANDAGES/DRESSINGS) ×3 IMPLANT
BNDG COHESIVE 6X5 TAN STRL LF (GAUZE/BANDAGES/DRESSINGS) ×2 IMPLANT
BNDG ESMARK 6X9 LF (GAUZE/BANDAGES/DRESSINGS)
BNDG GAUZE ELAST 4 BULKY (GAUZE/BANDAGES/DRESSINGS) ×12 IMPLANT
COVER SURGICAL LIGHT HANDLE (MISCELLANEOUS) ×1 IMPLANT
CUFF TOURN SGL QUICK 34 (TOURNIQUET CUFF) ×3
CUFF TRNQT CYL 34X4X40X1 (TOURNIQUET CUFF) ×1 IMPLANT
DRAPE ORTHO SPLIT 77X108 STRL (DRAPES)
DRAPE SHEET LG 3/4 BI-LAMINATE (DRAPES) ×2 IMPLANT
DRAPE SURG ORHT 6 SPLT 77X108 (DRAPES) IMPLANT
DRAPE U-SHAPE 47X51 STRL (DRAPES) ×1 IMPLANT
DRSG VAC ATS MED SENSATRAC (GAUZE/BANDAGES/DRESSINGS) ×2 IMPLANT
ELECT REM PT RETURN 9FT ADLT (ELECTROSURGICAL) ×3
ELECTRODE REM PT RTRN 9FT ADLT (ELECTROSURGICAL) ×1 IMPLANT
EVACUATOR 1/8 PVC DRAIN (DRAIN) IMPLANT
GAUZE SPONGE 4X4 12PLY STRL (GAUZE/BANDAGES/DRESSINGS) ×1 IMPLANT
GAUZE XEROFORM 5X9 LF (GAUZE/BANDAGES/DRESSINGS) ×1 IMPLANT
GLOVE BIOGEL PI IND STRL 7.5 (GLOVE) ×1 IMPLANT
GLOVE BIOGEL PI INDICATOR 7.5 (GLOVE) ×2
GLOVE SURG SS PI 7.5 STRL IVOR (GLOVE) ×3 IMPLANT
GOWN STRL REUS W/TWL XL LVL3 (GOWN DISPOSABLE) ×3 IMPLANT
KIT BASIN OR (CUSTOM PROCEDURE TRAY) ×3 IMPLANT
NS IRRIG 1000ML POUR BTL (IV SOLUTION) ×3 IMPLANT
PACK TOTAL JOINT (CUSTOM PROCEDURE TRAY) ×3 IMPLANT
PAD CAST 4YDX4 CTTN HI CHSV (CAST SUPPLIES) ×4 IMPLANT
PADDING CAST COTTON 4X4 STRL (CAST SUPPLIES)
POSITIONER SURGICAL ARM (MISCELLANEOUS) ×3 IMPLANT
SET IRRIG Y TYPE TUR BLADDER L (SET/KITS/TRAYS/PACK) ×3 IMPLANT
SOL PREP POV-IOD 4OZ 10% (MISCELLANEOUS) ×3 IMPLANT
SOL PREP PROV IODINE SCRUB 4OZ (MISCELLANEOUS) ×3 IMPLANT
SPONGE LAP 18X18 X RAY DECT (DISPOSABLE) ×2 IMPLANT
STAPLER VISISTAT 35W (STAPLE) ×3 IMPLANT
STOCKINETTE 8 INCH (MISCELLANEOUS) ×3 IMPLANT
SUT ETHILON 2 0 PSLX (SUTURE) ×6 IMPLANT
SUT SILK 2 0 (SUTURE)
SUT SILK 2 0 SH CR/8 (SUTURE) ×3 IMPLANT
SUT SILK 2-0 18XBRD TIE 12 (SUTURE) IMPLANT
SUT VIC AB 2-0 CT1 27 (SUTURE)
SUT VIC AB 2-0 CT1 TAPERPNT 27 (SUTURE) ×2 IMPLANT
SWAB COLLECTION DEVICE MRSA (MISCELLANEOUS) IMPLANT
TOWEL OR 17X26 10 PK STRL BLUE (TOWEL DISPOSABLE) ×5 IMPLANT
TUBE ANAEROBIC SPECIMEN COL (MISCELLANEOUS) IMPLANT
WATER STERILE IRR 1500ML POUR (IV SOLUTION) ×1 IMPLANT

## 2015-06-03 NOTE — Progress Notes (Signed)
[facility] Progress Note Patient Name: [name] DOB: [birth year] MRN: [id number]   Date of Service  06/03/2015  HPI/Events of Note  Patient with abnormal heart rhythm.  eICU Interventions  Will order: 1. 12 Lead EKG. 2. Send AM BMP and Mg++ now.      Intervention Category Intermediate Interventions: Arrhythmia - evaluation and management  Leyanna Bittman Eugene 06/03/2015, 1:20 AM

## 2015-06-03 NOTE — Transfer of Care (Signed)
Immediate Anesthesia Transfer of Care Note  Patient: [name]  Procedure(s) Performed: Procedure(s): Left Transtibial amputation (Left)  Patient Location: ICU  Anesthesia Type:General  Level of Consciousness: sedated and unresponsive  Airway & Oxygen Therapy: Patient remains intubated per anesthesia plan and Patient placed on Ventilator (see vital sign flow sheet for setting)  Post-op Assessment: Report given to RN and Post -op Vital signs reviewed and stable  Post vital signs: Reviewed and stable  Last Vitals:  Filed Vitals:   06/03/15 0700  BP: 108/36  Pulse: 73  Temp:   Resp: 18    Complications: No apparent anesthesia complications

## 2015-06-03 NOTE — Progress Notes (Signed)
Interpreter Lesle Chris  RN admitting

## 2015-06-03 NOTE — Progress Notes (Signed)
7/02 Admit, ortho consulted 7/03 Lt Chopart amputation 7/04 Lt leg amputation through tibia, fibula/on full vent support post-op/Rhonda Davis,RN,BSN,CCM

## 2015-06-03 NOTE — Progress Notes (Signed)
[facility] Wednesday for repeat I&D. Please contact me with any questions.    [name], MD [place] 9:29 AM

## 2015-06-03 NOTE — Anesthesia Postprocedure Evaluation (Signed)
Anesthesia Post Note  Patient: [name]  Procedure(s) Performed: Procedure(s) (LRB): Left Transtibial amputation,application of wound vac (Left)  Anesthesia type: General  Patient location: ICU  Post pain: Pain level controlled  Post assessment: Post-op Vital signs reviewed  Last Vitals:  Filed Vitals:   06/03/15 0954  BP: 172/36  Pulse: 83  Temp:   Resp: 18    Post vital signs: stable  Level of consciousness: Patient remains intubated per anesthesia plan  Complications: No apparent anesthesia complications

## 2015-06-03 NOTE — Anesthesia Preprocedure Evaluation (Addendum)
Anesthesia Evaluation  Patient identified by MRN, date of birth, ID band Patient unresponsive    Reviewed: Allergy & Precautions, NPO status , Patient's Chart, lab work & pertinent test results  History of Anesthesia Complications Negative for: history of anesthetic complications  Airway Mallampati: Intubated       Dental no notable dental hx.    Pulmonary neg pulmonary ROS,  breath sounds clear to auscultation  Pulmonary exam normal       Cardiovascular Normal cardiovascular examRhythm:Regular Rate:Normal     Neuro/Psych negative neurological ROS  negative psych ROS   GI/Hepatic negative GI ROS, Neg liver ROS,   Endo/Other  diabetes, Type 2, Insulin DependentLast glucose 310 on insulin drip  Renal/GU Renal InsufficiencyRenal disease  negative genitourinary   Musculoskeletal negative musculoskeletal ROS (+)   Abdominal   Peds negative pediatric ROS (+)  Hematology  (+) anemia ,   Anesthesia Other Findings   Reproductive/Obstetrics negative OB ROS                          Anesthesia Physical  Anesthesia Plan  ASA: IV and emergent  Anesthesia Plan: General   Post-op Pain Management:    Induction: Intravenous  Airway Management Planned: Oral ETT  Additional Equipment:   Intra-op Plan:   Post-operative Plan: Post-operative intubation/ventilation  Informed Consent: I have reviewed the patients History and Physical, chart, labs and discussed the procedure including the risks, benefits and alternatives for the proposed anesthesia with the patient or authorized representative who has indicated his/her understanding and acceptance.     Plan Discussed with: CRNA, Anesthesiologist and Surgeon  Anesthesia Plan Comments:        Anesthesia Quick Evaluation

## 2015-06-03 NOTE — Progress Notes (Signed)
Notified MD Ramaswamy about patient's HR (49, 50).  Orders received for 12-lead EKG.  Will continue to monitor and assess.

## 2015-06-03 NOTE — Progress Notes (Signed)
[facility] Progress Note Patient Name: [name] DOB: 05-11-1966 MRN: [id number]   Date of Service  06/03/2015  HPI/Events of Note  Temp = 102.3 F.  eICU Interventions  Will order Tylenol Suppository PRN Temp > 101.5 F.      Intervention Category Intermediate Interventions: Infection - evaluation and management  Sommer,Steven Eugene 06/03/2015, 3:45 AM

## 2015-06-03 NOTE — Progress Notes (Signed)
[facility] Progress Note Patient Name: [name] DOB: 1966/01/24 MRN: [id number]   Date of Service  06/03/2015  HPI/Events of Note  PM lab review  Recent Labs Lab 06/02/15 0152 06/02/15 0415 06/03/15 0100 06/03/15 0805 06/03/15 1150  K 3.6 3.4* 2.6* 3.5 3.1*    Recent Labs Lab 06/01/15 2129 06/01/15 2155 06/02/15 0415 06/03/15 0100 06/03/15 1150  CREATININE 2.15* 2.00* 1.09* 0.79 0.77    Recent Labs Lab 06/01/15 2129  06/02/15 0415 06/03/15 0100 06/03/15 0805  HGB 9.9*  < > 8.4* 9.8* 10.2*  HCT 29.0*  < > 24.3* 27.8* 30.0*  WBC 13.4*  --  20.8* 12.4*  --   PLT 488*  --  555* 428*  --   < > = values in this interval not displayed.   eICU Interventions  Mild low K - improved but still low  Plan 10meq kcl via OG     Intervention Category Intermediate Interventions: Electrolyte abnormality - evaluation and management  Hampton Cost 06/03/2015, 4:08 PM

## 2015-06-03 NOTE — Progress Notes (Signed)
Pt picked up by [facility] at [facility] for transfer to [facility] [place], [facility] RN given report and reported called to [facility]M recieving nurse. Last VSS before departure.

## 2015-06-03 NOTE — Progress Notes (Signed)
[facility] Progress Note Patient Name: [name] DOB: 06/10/1966 MRN: [id number]   Date of Service  06/03/2015  HPI/Events of Note  K+ = 2.6, Mg+ = 1.7 and Creatinine = 0.79. EKG - ? U wave.   eICU Interventions  Replete K+ and Mg++. Recheck BMP and Mg++ at 12 Noon.      Intervention Category Major Interventions: Electrolyte abnormality - evaluation and management  Sommer,Steven [place] 06/03/2015, 2:48 AM

## 2015-06-03 NOTE — Progress Notes (Signed)
[facility] Progress Note Patient Name: [name] DOB: 06-01-1966 MRN: [id number]   Date of Service  06/03/2015  HPI/Events of Note  Bedside RN calling eMD  - noticed HR 50s but occ 40s after increasing fentanyl gtt. Patient onl levophed with adequate bp   = looks sinus on monitor. Had low K and mag that was repleted   Recent Labs Lab 06/01/15 2129 06/01/15 2155  06/02/15 0152 06/02/15 0415 06/03/15 0100 06/03/15 0805 06/03/15 1150  NA 120* 118*  < > 138 143 149* 150* 152*  K 5.0 4.9  < > 3.6 3.4* 2.6* 3.5 3.1*  CL 85* 90*  --   --  112* 114*  --  116*  CO2 12*  --   --   --  19* 27  --  29  GLUCOSE 531* 546*  --  386* 312* 310*  --  265*  BUN 55* 52*  --   --  31* 14  --  12  CREATININE 2.15* 2.00*  --   --  1.09* 0.79  --  0.77  CALCIUM 8.2*  --   --   --  6.9* 7.1*  --  6.9*  MG  --   --   --   --  1.9  1.9 1.7  --  2.0  PHOS  --   --   --   --  3.2 2.7  --   --   < > = values in this interval not displayed.   eICU Interventions  12 lead ekg      Intervention Category Intermediate Interventions: Arrhythmia - evaluation and management  Beena Catano 06/03/2015, 8:32 PM

## 2015-06-03 NOTE — Progress Notes (Signed)
Spoke with infection prevention regarding patient's contact precautions and concerns of soft tissue infection.  As long as drainage is contained she said there is no need for contact precautions to remain in place.

## 2015-06-03 NOTE — Progress Notes (Addendum)
PULMONARY / CRITICAL CARE MEDICINE   Name: Jaymee Tilson MRN: [id number] DOB: Mar 14, 1966    ADMISSION DATE:  06/01/2015  REFERRING MD :  ER  CHIEF COMPLAINT:  Lt foot pain  BRIEF DESCRIPTION:  49 yo female with hx of DM presented to ER with Lt foot pain x one week associated with open wound.  Found to have septic shock and gangrene of Lt lower extremity.  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  Remains on pressors.  VITAL SIGNS: Temp:  [96.9 F (36.1 C)-102.5 F (39.2 C)] 102.5 F (39.2 C) (07/04 0315) Pulse Rate:  [73-107] 83 (07/04 0954) Resp:  [14-22] 18 (07/04 0954) BP: (90-172)/(35-68) 172/36 mmHg (07/04 0954) SpO2:  [100 %] 100 % (07/04 0954) Arterial Line BP: (59-165)/(29-96) 101/78 mmHg (07/04 0700) FiO2 (%):  [30 %] 30 % (07/04 0954) Weight:  [130 lb 8.2 oz (59.2 kg)] 130 lb 8.2 oz (59.2 kg) (07/04 0615) HEMODYNAMICS: CVP:  [4 mmHg-9 mmHg] 5 mmHg VENTILATOR SETTINGS: Vent Mode:  [-] PRVC FiO2 (%):  [30 %] 30 % Set Rate:  [18 bmp] 18 bmp Vt Set:  [350 mL] 350 mL PEEP:  [5 cmH20] 5 cmH20 Plateau Pressure:  [12 cmH20-13 cmH20] 13 cmH20 INTAKE / OUTPUT:  Intake/Output Summary (Last 24 hours) at 06/03/15 1016 Last data filed at 06/03/15 1008  Gross per 24 hour  Intake 5145.68 ml  Output   4045 ml  Net 1100.68 ml    PHYSICAL EXAMINATION: General: ill appearing Neuro:  RASS -1, moves extremities with stimulation HEENT:  ETT in place Cardiovascular:  Regular, no murmur Lungs:  No wheeze Abdomen:  Soft, non tender Musculoskeletal: 1+ edema Skin:  Lt BKA, wound dressing clean  LABS:  CBC  Recent Labs Lab 06/01/15 2129  06/02/15 0415 06/03/15 0100 06/03/15 0805  WBC 13.4*  --  20.8* 12.4*  --   HGB 9.9*  < > 8.4* 9.8* 10.2*  HCT 29.0*  < > 24.3* 27.8* 30.0*  PLT  488*  --  555* 428*  --   < > = values in this interval not displayed. Coag's  Recent Labs Lab 06/02/15 0415  APTT 31  INR 1.43   BMET  Recent Labs Lab 06/01/15 2129 06/01/15 2155  06/02/15 0152 06/02/15 0415 06/03/15 0100 06/03/15 0805  NA 120* 118*  < > 138 143 149* 150*  K 5.0 4.9  < > 3.6 3.4* 2.6* 3.5  CL 85* 90*  --   --  112* 114*  --   CO2 12*  --   --   --  19* 27  --   BUN 55* 52*  --   --  31* 14  --   CREATININE 2.15* 2.00*  --   --  1.09* 0.79  --   GLUCOSE 531* 546*  --  386* 312* 310*  --   < > = values in this interval not displayed. Electrolytes   Recent Labs Lab 06/01/15 2129 06/02/15 0415 06/03/15 0100  CALCIUM 8.2* 6.9* 7.1*  MG  --  1.9  1.9 1.7  PHOS  --  3.2 2.7   Sepsis Markers  Recent Labs Lab 06/02/15 0415  06/02/15 1430 06/02/15 1820 06/03/15 0100  LATICACIDVEN 1.5  < > 1.3 1.2 1.3  PROCALCITON 14.82  --   --   --   --   < > =  values in this interval not displayed. ABG  Recent Labs Lab 06/02/15 0730 06/03/15 0423 06/03/15 0805  PHART 7.297* 7.401 7.486*  PCO2ART 41.8 41.7 36.1  PO2ART 132* 84.3 220.0*   Liver Enzymes  Recent Labs Lab 06/01/15 2129 06/02/15 0415 06/03/15 0100  AST 28 29 16   ALT 22 22 18   ALKPHOS 193* 165* 140*  BILITOT 1.6* 1.2 1.1  ALBUMIN 2.3* 1.8* 1.9*   Cardiac Enzymes  Recent Labs Lab 06/02/15 0415 06/02/15 0923 06/02/15 1520  TROPONINI <0.03 0.03 0.06*   Glucose  Recent Labs Lab 06/02/15 1604 06/02/15 1827 06/02/15 2027 06/02/15 2355 06/03/15 0313 06/03/15 0410  GLUCAP 216* 257* 267* 262* 235* 267*    Imaging Dg Chest Port 1 View  06/03/2015   CLINICAL DATA:  Respiratory failure.  EXAM: PORTABLE CHEST - 1 VIEW  COMPARISON:  06/02/2015  FINDINGS: The cardiac silhouette, mediastinal and hilar contours are within normal limits and stable. The endotracheal tube, NG tube and left subclavian catheters are unchanged. No significant pulmonary findings. No pleural effusion or  pneumothorax.  IMPRESSION: 1. Stable support apparatus. 2. No significant pulmonary findings.   Electronically Signed   By: Marijo Sanes M.D.   On: 06/03/2015 07:09     ASSESSMENT / PLAN:  PULMONARY ETT 7/03 >> A: Acute respiratory failure 2nd to septic shock and acidosis. P:   Full vent support F/u CXR Decrease respiratory rate to 14  CARDIOVASCULAR Lt [place] CVL 7/03 >> A:  Septic shock. Hx of HTN, HLD. P:  Goal CVP 8 to 12 Pressors to keep MAP > 65  RENAL A:   AKI 2nd to septic shock. Lactic acidosis >> resolved. Hypokalemia, hypomagnesemia. P:   Volume resuscitate Monitor renal fx, urine outpt Replace electrolytes as needed  GASTROINTESTINAL A:   Nutrition. P:   Tube feeds while on vent Protonix for SUP  HEMATOLOGIC A:   Anemia of critical illness and chronic disease. P:  F/u CBC SCD for DVT prophylaxis >> add SQ heparin when okay with surgery  INFECTIOUS A:   Septic shock 2nd to Lt lower extremity gangrene s/p amputation. Candidal vulvovaginitis. P:   Post-op/wound care per ortho, wound care team Day 3 cleocin, zosyn, vancomycin Contact isolation due to concern for necrotizing fasciitis Diflucan x one on 7/04, lotrimin cream  Urine 7/02 >> Blood 7/02 >> GPC in clusters Blood 7/03 >> Wound 7/03 >> GPC in pairs  ENDOCRINE A:   DM type II. P:   SSI Increase lantus to 25 units on 7/04  NEUROLOGIC A:   Acute encephalopathy 2nd to septic shock. P:   RASS goal: -1  Updated pt's family friend 7/04 at bedside.  Explained that given severity of he leg infection with gas forming organisms that amputation was necessary to control infection, and that w/o this her likelihood of mortality was exceedingly high.  Also explained that she will likely need repeat trips to OR and possible further amputation to ensure infection is controlled.  CC time 35 minutes.  Chesley Mires, MD [facility] Pulmonary/Critical Care 06/03/2015, 10:16 AM Pager:   [phone] After 3pm call: [phone]

## 2015-06-03 NOTE — H&P (Signed)

## 2015-06-03 NOTE — Op Note (Signed)
   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 Amputation of the left leg through the tibia and fibula 2. Application of negative wound therapy less than 50 cm 3. Incision and drainage of left lower leg lateral muscular compartment through separate incision.  Surgeon: Eduard Roux, M.D.  Anesthesia: Gen. next  Estimated blood loss: 034 mL  Complications: None  Drains: One wound VAC  Operative findings: Myositis and fasciitis with frank pus of the lateral muscular compartment with dead lateral compartment muscles.  Indications for procedure: The patient returns today for a planned below the knee amputation for her gas gangrene of the left lower extremity. Her husband consented to the surgery and he was made aware of the risks, benefits, and alternatives to surgery and he wished to proceed. This was done through the use of a medical interpreter. He was aware of the possibility of additional surgery and failure to eradicate the infection.  Discretion of procedure: The patient was brought down from the ICU intubated. She was placed supine on the operating room table. A timeout was performed. Preoperative antibiosis were given. The left lower extremity was prepped and draped in standard sterile fashion. A nonsterile tourniquet was placed on the upper left thigh prior to prepping and draping. We created a fishmouth incision with a large posterior flap halfway down the tibia. Blunt dissection was taken down through the muscle bellies. The neurovascular structures were identified and ligated. The tibia was osteotomized approximately 16 cm distal to the level of the joint. The fibula was osteotomized 1 cm proximal to the level of the tibial osteotomy. Sharp edges were rounded off. During careful inspection of the wound there was still gross purulence emanating from the lateral muscular compartment. I made a  counterincision over the lateral muscular compartment. There was frank pus within this muscular compartment. Sharp excisional debridement of the muscle and the fascia was performed using a rongeur. All dead muscle was sharply debrided. After we felt that we had adequate debridement we thoroughly irrigated the entire wound with 6 L of normal saline. We then placed a gram of vancomycin powder within the lateral muscular compartment.  The wound was loosely closed over a wound VAC. The patient tolerated the procedure well and was transported back up to the ICU intubated. All sponge counts were correct. Next  Disposition: The patient will need to remain on broad-spectrum antibiotics until speciation and and will require long-term IV anti-biotics.  We will take her back to the operating room in 2-3 days for a repeat look. If tissues look good then we will plan on closing the entire incision.  She will need to be transferred to [facility] cone for the additional surgery.    [name], MD [place] 9:28 AM

## 2015-06-03 NOTE — Progress Notes (Signed)
Transported patient to OR bagging with 100% O2. VS remained stable

## 2015-06-04 ENCOUNTER — Encounter (HOSPITAL_COMMUNITY): Payer: Self-pay | Admitting: Orthopaedic Surgery

## 2015-06-04 ENCOUNTER — Inpatient Hospital Stay (HOSPITAL_COMMUNITY): Payer: Medicaid Other

## 2015-06-04 DIAGNOSIS — Z89512 Acquired absence of left leg below knee: Secondary | ICD-10-CM | POA: Diagnosis not present

## 2015-06-04 DIAGNOSIS — Z833 Family history of diabetes mellitus: Secondary | ICD-10-CM | POA: Diagnosis not present

## 2015-06-04 DIAGNOSIS — L089 Local infection of the skin and subcutaneous tissue, unspecified: Secondary | ICD-10-CM | POA: Diagnosis present

## 2015-06-04 DIAGNOSIS — C52 Malignant neoplasm of vagina: Secondary | ICD-10-CM | POA: Diagnosis present

## 2015-06-04 DIAGNOSIS — E872 Acidosis: Secondary | ICD-10-CM | POA: Diagnosis present

## 2015-06-04 DIAGNOSIS — A480 Gas gangrene: Secondary | ICD-10-CM | POA: Diagnosis present

## 2015-06-04 DIAGNOSIS — B373 Candidiasis of vulva and vagina: Secondary | ICD-10-CM | POA: Diagnosis present

## 2015-06-04 DIAGNOSIS — I10 Essential (primary) hypertension: Secondary | ICD-10-CM | POA: Diagnosis present

## 2015-06-04 DIAGNOSIS — A4101 Sepsis due to Methicillin susceptible Staphylococcus aureus: Secondary | ICD-10-CM | POA: Diagnosis present

## 2015-06-04 DIAGNOSIS — E876 Hypokalemia: Secondary | ICD-10-CM | POA: Diagnosis present

## 2015-06-04 DIAGNOSIS — D638 Anemia in other chronic diseases classified elsewhere: Secondary | ICD-10-CM | POA: Diagnosis present

## 2015-06-04 DIAGNOSIS — L97529 Non-pressure chronic ulcer of other part of left foot with unspecified severity: Secondary | ICD-10-CM | POA: Diagnosis present

## 2015-06-04 DIAGNOSIS — N179 Acute kidney failure, unspecified: Secondary | ICD-10-CM | POA: Diagnosis present

## 2015-06-04 DIAGNOSIS — R6521 Severe sepsis with septic shock: Secondary | ICD-10-CM | POA: Diagnosis present

## 2015-06-04 DIAGNOSIS — J9600 Acute respiratory failure, unspecified whether with hypoxia or hypercapnia: Secondary | ICD-10-CM

## 2015-06-04 DIAGNOSIS — E1165 Type 2 diabetes mellitus with hyperglycemia: Secondary | ICD-10-CM | POA: Diagnosis present

## 2015-06-04 DIAGNOSIS — Z8589 Personal history of malignant neoplasm of other organs and systems: Secondary | ICD-10-CM | POA: Diagnosis not present

## 2015-06-04 DIAGNOSIS — G92 Toxic encephalopathy: Secondary | ICD-10-CM | POA: Diagnosis present

## 2015-06-04 DIAGNOSIS — E785 Hyperlipidemia, unspecified: Secondary | ICD-10-CM | POA: Diagnosis present

## 2015-06-04 DIAGNOSIS — Z794 Long term (current) use of insulin: Secondary | ICD-10-CM | POA: Diagnosis not present

## 2015-06-04 DIAGNOSIS — Z923 Personal history of irradiation: Secondary | ICD-10-CM | POA: Diagnosis not present

## 2015-06-04 DIAGNOSIS — E11621 Type 2 diabetes mellitus with foot ulcer: Secondary | ICD-10-CM | POA: Diagnosis present

## 2015-06-04 LAB — GLUCOSE, CAPILLARY
GLUCOSE-CAPILLARY: 141 mg/dL — AB (ref 65–99)
GLUCOSE-CAPILLARY: 167 mg/dL — AB (ref 65–99)
GLUCOSE-CAPILLARY: 198 mg/dL — AB (ref 65–99)
GLUCOSE-CAPILLARY: 205 mg/dL — AB (ref 65–99)
Glucose-Capillary: 134 mg/dL — ABNORMAL HIGH (ref 65–99)
Glucose-Capillary: 169 mg/dL — ABNORMAL HIGH (ref 65–99)

## 2015-06-04 LAB — BASIC METABOLIC PANEL
Anion gap: 6 (ref 5–15)
BUN: 9 mg/dL (ref 6–20)
CALCIUM: 7.2 mg/dL — AB (ref 8.9–10.3)
CO2: 31 mmol/L (ref 22–32)
Chloride: 114 mmol/L — ABNORMAL HIGH (ref 101–111)
Creatinine, Ser: 0.67 mg/dL (ref 0.44–1.00)
Glucose, Bld: 178 mg/dL — ABNORMAL HIGH (ref 65–99)
POTASSIUM: 3.4 mmol/L — AB (ref 3.5–5.1)
Sodium: 151 mmol/L — ABNORMAL HIGH (ref 135–145)

## 2015-06-04 LAB — POCT I-STAT 3, ART BLOOD GAS (G3+)
Acid-Base Excess: 7 mmol/L — ABNORMAL HIGH (ref 0.0–2.0)
Bicarbonate: 31.4 mEq/L — ABNORMAL HIGH (ref 20.0–24.0)
O2 Saturation: 99 %
Patient temperature: 98.6
TCO2: 33 mmol/L (ref 0–100)
pCO2 arterial: 46.8 mmHg — ABNORMAL HIGH (ref 35.0–45.0)
pH, Arterial: 7.435 (ref 7.350–7.450)
pO2, Arterial: 116 mmHg — ABNORMAL HIGH (ref 80.0–100.0)

## 2015-06-04 LAB — CBC
HCT: 24.1 % — ABNORMAL LOW (ref 36.0–46.0)
HEMOGLOBIN: 8.4 g/dL — AB (ref 12.0–15.0)
MCH: 29.1 pg (ref 26.0–34.0)
MCHC: 34.9 g/dL (ref 30.0–36.0)
MCV: 83.4 fL (ref 78.0–100.0)
Platelets: 366 10*3/uL (ref 150–400)
RBC: 2.89 MIL/uL — ABNORMAL LOW (ref 3.87–5.11)
RDW: 14.1 % (ref 11.5–15.5)
WBC: 12.1 10*3/uL — ABNORMAL HIGH (ref 4.0–10.5)

## 2015-06-04 LAB — URINE CULTURE

## 2015-06-04 LAB — HEMOGLOBIN A1C
HEMOGLOBIN A1C: 11.7 % — AB (ref 4.8–5.6)
Mean Plasma Glucose: 289 mg/dL

## 2015-06-04 LAB — MAGNESIUM: Magnesium: 1.9 mg/dL (ref 1.7–2.4)

## 2015-06-04 LAB — VANCOMYCIN, TROUGH: VANCOMYCIN TR: 10 ug/mL (ref 10.0–20.0)

## 2015-06-04 MED ORDER — CEFTRIAXONE SODIUM IN DEXTROSE 20 MG/ML IV SOLN: 1.0000 g | INTRAVENOUS | Status: DC

## 2015-06-04 MED ORDER — POTASSIUM CHLORIDE 10 MEQ/50ML IV SOLN
10.0000 meq | INTRAVENOUS | Status: AC
  Administered 2015-06-04 (×4): 10 meq via INTRAVENOUS
  Filled 2015-06-04 (×4): qty 50

## 2015-06-04 MED ORDER — ONDANSETRON HCL 4 MG/2ML IJ SOLN
4.0000 mg | Freq: Three times a day (TID) | INTRAMUSCULAR | Status: DC | PRN
  Administered 2015-06-04 – 2015-06-08 (×2): 4 mg via INTRAVENOUS
  Filled 2015-06-04: qty 2

## 2015-06-04 MED ORDER — METOCLOPRAMIDE HCL 5 MG/ML IJ SOLN
5.0000 mg | Freq: Three times a day (TID) | INTRAMUSCULAR | Status: DC
  Administered 2015-06-04 – 2015-06-06 (×6): 5 mg via INTRAVENOUS
  Filled 2015-06-04 (×9): qty 1

## 2015-06-04 MED ORDER — FAMOTIDINE IN NACL 20-0.9 MG/50ML-% IV SOLN
20.0000 mg | INTRAVENOUS | Status: DC
  Administered 2015-06-04 – 2015-06-05 (×2): 20 mg via INTRAVENOUS
  Filled 2015-06-04 (×3): qty 50

## 2015-06-04 MED ORDER — CEFTRIAXONE SODIUM IN DEXTROSE 40 MG/ML IV SOLN
2.0000 g | INTRAVENOUS | Status: DC
  Administered 2015-06-04 – 2015-06-05 (×2): 2 g via INTRAVENOUS
  Filled 2015-06-04 (×2): qty 50

## 2015-06-04 MED ORDER — POTASSIUM CHLORIDE 20 MEQ/15ML (10%) PO SOLN
20.0000 meq | ORAL | Status: DC
  Administered 2015-06-04: 20 meq
  Filled 2015-06-04: qty 15

## 2015-06-04 MED ORDER — VANCOMYCIN HCL 500 MG IV SOLR
500.0000 mg | Freq: Three times a day (TID) | INTRAVENOUS | Status: DC
  Administered 2015-06-04 – 2015-06-05 (×3): 500 mg via INTRAVENOUS
  Filled 2015-06-04 (×4): qty 500

## 2015-06-04 MED ORDER — DEXTROSE 5 % IV SOLN
INTRAVENOUS | Status: DC
  Administered 2015-06-04 – 2015-06-05 (×3): via INTRAVENOUS
  Administered 2015-06-06: 50 mL via INTRAVENOUS
  Administered 2015-06-08 – 2015-06-09 (×3): via INTRAVENOUS

## 2015-06-04 NOTE — Progress Notes (Signed)
   Subjective:  Patient remains intubated.  Requiring less pressors.  Objective:   VITALS:   Filed Vitals:   06/04/15 0515 06/04/15 0530 06/04/15 0600 06/04/15 0700  BP:   120/90 125/53  Pulse: 29 52 80 79  Temp:      TempSrc:      Resp: 14 14 15 14   Height:      Weight:      SpO2: 100% 100% 100% 100%    Intubated Responds to stimuli VAC with good seal   Lab Results  Component Value Date   WBC 12.1* 06/04/2015   HGB 8.4* 06/04/2015   HCT 24.1* 06/04/2015   MCV 83.4 06/04/2015   PLT 366 06/04/2015     Assessment/Plan:  1 Day Post-Op   - recommend MRI of lower leg and knee to r/o proximal extension of infection if patient is able to undergo MRI - plan for repeat I&D with possible closure of wounds - cultures growing out GPC and GNR - continue vanc and zosyn - clinically improved - will need consent from husband through interpretor - NPO after midnight   Marianna Payment 06/04/2015, 7:40 AM [phone]

## 2015-06-04 NOTE — Progress Notes (Signed)
Used the telephonic translating service to update her husband on his wife's current condition: afebrile, BP requiring low dose pressors, increasing her fentanyl (pain medication) to make his wife comfortable, to have her husband ask her to stop biting the ETT, and to explain that it is helping her breath.

## 2015-06-04 NOTE — Progress Notes (Signed)
PULMONARY / CRITICAL CARE MEDICINE   Name: [name] MRN: [id number] DOB: Feb 01, 1966    ADMISSION DATE:  06/01/2015  REFERRING MD :  ER  CHIEF COMPLAINT:  Left Foot Pain  BRIEF DESCRIPTION:  49 yo female with hx of DM presented to ER with Lt foot pain x one week associated with open wound.  Found to have septic shock and gangrene of Lt lower extremity.  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  VITAL SIGNS: Temp:  [97.9 F (36.6 C)-102.9 F (39.4 C)] 100.6 F (38.1 C) (07/05 0700) Pulse Rate:  [29-101] 32 (07/05 0800) Resp:  [14-18] 14 (07/05 0800) BP: (72-172)/(36-90) 129/57 mmHg (07/05 0800) SpO2:  [98 %-100 %] 100 % (07/05 0843) Arterial Line BP: (86-175)/(34-76) 132/45 mmHg (07/05 0800) FiO2 (%):  [30 %] 30 % (07/05 0843) Weight:  [119 lb 14.9 oz (54.4 kg)] 119 lb 14.9 oz (54.4 kg) (07/05 0500) HEMODYNAMICS: CVP:  [3 mmHg-8 mmHg] 8 mmHg VENTILATOR SETTINGS: Vent Mode:  [-] PRVC FiO2 (%):  [30 %] 30 % Set Rate:  [14 bmp-18 bmp] 14 bmp Vt Set:  [350 mL] 350 mL PEEP:  [5 cmH20] 5 cmH20 Plateau Pressure:  [13 cmH20-14 cmH20] 13 cmH20 INTAKE / OUTPUT:  Intake/Output Summary (Last 24 hours) at 06/04/15 0936 Last data filed at 06/04/15 0800  Gross per 24 hour  Intake 3271.35 ml  Output   2595 ml  Net 676.35 ml    PHYSICAL EXAMINATION: General: 49yo female, alert, on vent Neuro:  Alert, moves extremities spontaneously HEENT:  ETT in place, MMM Cardiovascular:  S1 and S2 noted, no murmur, regular rate and rhythm Lungs: CTAB, no wheezes, on Vent Abdomen:  Soft, non-distended, non tender Musculoskeletal: 1+ edema Skin:  Lt BKA, wound dressing clean  LABS:  CBC  Recent Labs Lab 06/02/15 0415 06/03/15 0100 06/03/15 0805 06/04/15 0357  WBC 20.8* 12.4*  --  12.1*   HGB 8.4* 9.8* 10.2* 8.4*  HCT 24.3* 27.8* 30.0* 24.1*  PLT 555* 428*  --  366   Coag's  Recent Labs Lab 06/02/15 0415  APTT 31  INR 1.43   BMET  Recent Labs Lab 06/03/15 0100 06/03/15 0805 06/03/15 1150 06/04/15 0357  NA 149* 150* 152* 151*  K 2.6* 3.5 3.1* 3.4*  CL 114*  --  116* 114*  CO2 27  --  29 31  BUN 14  --  12 9  CREATININE 0.79  --  0.77 0.67  GLUCOSE 310*  --  265* 178*   Electrolytes   Recent Labs Lab 06/02/15 0415 06/03/15 0100 06/03/15 1150 06/04/15 0357  CALCIUM 6.9* 7.1* 6.9* 7.2*  MG 1.9  1.9 1.7 2.0 1.9  PHOS 3.2 2.7  --   --    Sepsis Markers  Recent Labs Lab 06/02/15 0415  06/02/15 1430 06/02/15 1820 06/03/15 0100  LATICACIDVEN 1.5  < > 1.3 1.2 1.3  PROCALCITON 14.82  --   --   --   --   < > = values in this interval not displayed. ABG  Recent Labs Lab 06/03/15 0423 06/03/15 0805 06/04/15 0350  PHART 7.401 7.486* 7.435  PCO2ART 41.7 36.1 46.8*  PO2ART 84.3 220.0* 116.0*   Liver Enzymes  Recent Labs Lab 06/01/15 2129 06/02/15 0415 06/03/15 0100  AST 28 29 16   ALT 22 22 18  ALKPHOS 193* 165* 140*  BILITOT 1.6* 1.2 1.1  ALBUMIN 2.3* 1.8* 1.9*   Cardiac Enzymes  Recent Labs Lab 06/02/15 0415 06/02/15 0923 06/02/15 1520  TROPONINI <0.03 0.03 0.06*   Glucose  Recent Labs Lab 06/03/15 1536 06/03/15 1709 06/03/15 2015 06/04/15 0018 06/04/15 0358 06/04/15 0827  GLUCAP 216* 176* 211* 205* 167* 134*    Imaging Dg Chest Port 1 View  06/04/2015   CLINICAL DATA:  Respiratory failure.  Assess endotracheal tube  EXAM: PORTABLE CHEST - 1 VIEW  COMPARISON:  06/03/2015  FINDINGS: The endotracheal tube is in good position between the clavicular heads and carina. Stable subclavian central line from the left, tip at the upper cavoatrial junction. The orogastric tube enters the stomach at least.  Normal heart size and mediastinal contours. No acute infiltrate or edema. No effusion or pneumothorax.  IMPRESSION: 1.  Tubes and central line remain in good position. 2. Lungs remain clear.   Electronically Signed   By: Monte Fantasia M.D.   On: 06/04/2015 05:52   ASSESSMENT / PLAN:  PULMONARY ETT 7/03 >> A: Acute respiratory failure 2nd to septic shock and acidosis.  P:   Full vent support Will continue on vent. OR tomorrow and will attempt wean as indicated post-op.  CARDIOVASCULAR Lt [place] CVL 7/03 >> A:  Septic shock. Hx of HTN, HLD.  P:  Goal CVP 8 to 12 Pressors to keep MAP > 65 Levophed--wean as able  RENAL A:   AKI 2nd to septic shock. Lactic acidosis--resolved. Hypokalemia--Improving (2.6>3.4)  P:   Volume resuscitate Monitor renal fx, urine outpt Replace electrolytes as needed  GASTROINTESTINAL A:   Nutrition. Nausea/Vomiting  P:   Tube feeds while on vent Protonix for SUP Pepcid Reglan  HEMATOLOGIC A:   Anemia of critical illness and chronic disease.  P:  F/u CBC SCD for DVT prophylaxis. OR tomorrow, but consider initiating pharmacologic prophylaxis post-op  INFECTIOUS A:   Septic shock 2nd to Lt lower extremity gangrene s/p amputation. Candidal vulvovaginitis.  P:   Post-op/wound care per ortho, wound care team Contact isolation due to concern for necrotizing fasciitis Diflucan x one on 7/04, lotrimin cream To OR again 7/6 for further debridement  Urine 7/02 >> Blood 7/02 >> GPC in clusters Blood 7/03 >> Wound 7/03 >> GPC in pairs  Clindamycin 7/3>>7/5 Zosyn 7/2>>7/5 Vancomycin 7/2>> CTX 7/5>> Clotrimazole vaginal cream, Completed Diflucan x1 7/4  ENDOCRINE A:   DM type II.  P:   Resistant Insulin Sliding Scale Holding Lantus tomorrow morning due to surgery. Will restart post-op  NEUROLOGIC A:   Acute encephalopathy 2nd to septic shock.  P:   RASS goal: -1 Fentanyl, Versed  Dr. Junie Panning, DO Family Medicine, PGY-2  06/04/2015, 9:36 AM  PCCM ATTENDING: I have reviewed pt's initial presentation, consultants notes and hospital  database in detail.  The above assessment and plan was formulated under my direction.  In summary: She tolerates PS mode well. No extubation due to planned surgery 7/06 Severe sepsis is resolving   Merton Border, MD;  PCCM service; Mobile [phone]

## 2015-06-04 NOTE — Progress Notes (Signed)
[facility] ADULT ICU REPLACEMENT PROTOCOL FOR AM LAB REPLACEMENT ONLY  The patient does apply for the [facility] Adult ICU Electrolyte Replacment Protocol based on the criteria listed below:   1. Is GFR >/= 40 ml/min? Yes.    Patient's GFR today is >60 2. Is urine output >/= 0.5 ml/kg/hr for the last 6 hours? Yes.   Patient's UOP is 1.0 ml/kg/hr 3. Is BUN < 60 mg/dL? Yes.    Patient's BUN today is 9 4. Abnormal electrolyte(s): K+3.4 5. Ordered repletion with: protocol 6. If a panic level lab has been reported, has the CCM MD in charge been notified? No..   Physician:  Nicanor Bake [facility] 06/04/2015 5:24 AM

## 2015-06-04 NOTE — Progress Notes (Signed)
Interpreter Lesle Chris RN Alger Simons Consent  Form for detriment

## 2015-06-04 NOTE — Progress Notes (Signed)
ANTIBIOTIC CONSULT NOTE - FOLLOW UP  Pharmacy Consult for Vancomycin Indication: L foot cellulitis/gangrene with GPC bacteremia  No Known Allergies  Patient Measurements: Height: 4\' 11"  (149.9 cm) Weight: 119 lb 14.9 oz (54.4 kg) IBW/kg (Calculated) : 43.2  Vital Signs: Temp: 100.6 F (38.1 C) (07/05 0700) Temp Source: Oral (07/05 0700) BP: 119/59 mmHg (07/05 1100) Pulse Rate: 81 (07/05 1100) Intake/Output from previous day: 07/04 0701 - 07/05 0700 In: 3376 [I.V.:1783.5; NG/GT:320; IV Piggyback:662.5] Out: 2745 [Urine:2645; Drains:50; Blood:50] Intake/Output from this shift: Total I/O In: 218.1 [I.V.:118.1; IV Piggyback:100] Out: 475 [Urine:475]  Labs:  Recent Labs  06/02/15 0415 06/03/15 0100 06/03/15 0805 06/03/15 1150 06/04/15 0357  WBC 20.8* 12.4*  --   --  12.1*  HGB 8.4* 9.8* 10.2*  --  8.4*  PLT 555* 428*  --   --  366  CREATININE 1.09* 0.79  --  0.77 0.67   Estimated Creatinine Clearance: 63.4 mL/min (by C-G formula based on Cr of 0.67).  Recent Labs  06/04/15 0930  [place] [facility]     Microbiology: Recent Results (from the past 720 hour(s))  Blood Culture (routine x 2)     Status: None (Preliminary result)   Collection Time: 06/01/15  9:29 PM  Result Value Ref Range Status   Specimen Description BLOOD RIGHT ANTECUBITAL  Final   Special Requests BOTTLES DRAWN AEROBIC AND ANAEROBIC 5CC  Final   Culture  Setup Time   Final    GRAM POSITIVE COCCI IN CLUSTERS CRITICAL RESULT CALLED TO, READ BACK BY AND VERIFIED WITH: Talbert Cage [id number] [address] [place] IN BOTH AEROBIC AND ANAEROBIC BOTTLES    Culture   Final    GRAM POSITIVE COCCI IN CLUSTERS Performed at [facility]    Report Status PENDING  Incomplete  Blood Culture (routine x 2)     Status: None (Preliminary result)   Collection Time: 06/01/15  9:29 PM  Result Value Ref Range Status   Specimen Description BLOOD LEFT ANTECUBITAL  Final   Special Requests BOTTLES DRAWN AEROBIC AND ANAEROBIC  5CC  Final   Culture  Setup Time   Final    GRAM POSITIVE COCCI IN CLUSTERS IN BOTH AEROBIC AND ANAEROBIC BOTTLES CRITICAL RESULT CALLED TO, READ BACK BY AND VERIFIED WITH: Talbert Cage [id number] [address] [place] CONFIRMED BY K WILDER    Culture   Final    CULTURE REINCUBATED FOR BETTER GROWTH Performed at [facility]    Report Status PENDING  Incomplete  Urine culture     Status: None   Collection Time: 06/01/15  9:44 PM  Result Value Ref Range Status   Specimen Description URINE, CATHETERIZED  Final   Special Requests NONE  Final   Culture   Final    >=100,000 COLONIES/mL ESCHERICHIA COLI Performed at [facility]    Report Status 06/04/2015 FINAL  Final   Organism ID, Bacteria ESCHERICHIA COLI  Final      Susceptibility   Escherichia coli - MIC*    AMPICILLIN >=32 RESISTANT Resistant     CEFAZOLIN <=4 SENSITIVE Sensitive     CEFTRIAXONE <=1 SENSITIVE Sensitive     CIPROFLOXACIN <=0.25 SENSITIVE Sensitive     GENTAMICIN <=1 SENSITIVE Sensitive     IMIPENEM <=0.25 SENSITIVE Sensitive     NITROFURANTOIN <=16 SENSITIVE Sensitive     TRIMETH/SULFA <=20 SENSITIVE Sensitive     AMPICILLIN/SULBACTAM 16 INTERMEDIATE Intermediate     PIP/TAZO <=4 SENSITIVE Sensitive     * >=100,000 COLONIES/mL ESCHERICHIA  COLI  Gram stain     Status: None   Collection Time: 06/02/15  1:30 AM  Result Value Ref Range Status   Specimen Description ABSCESS L FOOT  Final   Special Requests NONE  Final   Gram Stain   Final    ABUNDANT GRAM POSITIVE COCCI IN PAIRS FEW WBC PRESENT, PREDOMINANTLY PMN Gram Stain Report Called to,Read Back By and Verified With: Dollene Cleveland RN @ 1308 ON 06/02/15 BY C DAVIS    Report Status 06/02/2015 FINAL  Final  Culture, routine-abscess     Status: None (Preliminary result)   Collection Time: 06/02/15  1:30 AM  Result Value Ref Range Status   Specimen Description ABSCESS L FOOT A  Final   Special Requests NONE  Final   Gram Stain   Final    FEW WBC PRESENT,  PREDOMINANTLY PMN ABUNDANT GRAM POSITIVE COCCI IN PAIRS Gram Stain Report Called to,Read Back By and Verified With: Gram Stain Report Called to,Read Back By and Verified With: Dollene Cleveland RN @ [phone] ON 06/02/15 BY C DAVIS Performed by [facility] Performed at [facility]    Culture   Final    Culture reincubated for better growth Performed at [facility]    Report Status PENDING  Incomplete  Culture, routine-abscess     Status: None (Preliminary result)   Collection Time: 06/02/15  1:30 AM  Result Value Ref Range Status   Specimen Description ABSCESS L FOOT B  Final   Special Requests NONE  Final   Gram Stain   Final    FEW WBC PRESENT,BOTH PMN AND MONONUCLEAR NO SQUAMOUS EPITHELIAL CELLS SEEN ABUNDANT GRAM NEGATIVE RODS MODERATE GRAM POSITIVE COCCI IN PAIRS IN CLUSTERS    Culture   Final    Culture reincubated for better growth Performed at [facility]    Report Status PENDING  Incomplete  Anaerobic culture     Status: None (Preliminary result)   Collection Time: 06/02/15  1:30 AM  Result Value Ref Range Status   Specimen Description ABSCESS L FOOT A  Final   Special Requests NONE  Final   Gram Stain   Final    MODERATE WBC PRESENT,BOTH PMN AND MONONUCLEAR NO SQUAMOUS EPITHELIAL CELLS SEEN ABUNDANT GRAM NEGATIVE RODS ABUNDANT GRAM POSITIVE COCCI IN PAIRS IN CLUSTERS    Culture   Final    NO ANAEROBES ISOLATED; CULTURE IN PROGRESS FOR 5 DAYS Performed at [facility]    Report Status PENDING  Incomplete  Anaerobic culture     Status: None (Preliminary result)   Collection Time: 06/02/15  1:30 AM  Result Value Ref Range Status   Specimen Description ABSCESS L FOOT B  Final   Special Requests NONE  Final   Gram Stain   Final    MODERATE WBC PRESENT,BOTH PMN AND MONONUCLEAR NO SQUAMOUS EPITHELIAL CELLS SEEN ABUNDANT GRAM NEGATIVE RODS ABUNDANT GRAM POSITIVE COCCI IN PAIRS IN CLUSTERS    Culture   Final    NO ANAEROBES  ISOLATED; CULTURE IN PROGRESS FOR 5 DAYS Performed at [facility]    Report Status PENDING  Incomplete  MRSA PCR Screening     Status: None   Collection Time: 06/02/15  3:19 AM  Result Value Ref Range Status   MRSA by PCR NEGATIVE NEGATIVE Final    Comment:        The GeneXpert MRSA Assay (FDA approved for NASAL specimens only), is one component of a comprehensive MRSA  colonization surveillance program. It is not intended to diagnose MRSA infection nor to guide or monitor treatment for MRSA infections.     Anti-infectives    Start     Dose/Rate Route Frequency Ordered Stop   06/03/15 1200  fluconazole (DIFLUCAN) IVPB 150 mg     150 mg 75 mL/hr over 60 Minutes Intravenous  Once 06/03/15 1056 06/03/15 1242   06/03/15 0944  vancomycin (VANCOCIN) powder  Status:  Discontinued       As needed 06/03/15 0944 06/03/15 0944   06/02/15 2200  vancomycin (VANCOCIN) IVPB 750 mg/150 ml premix  Status:  Discontinued     750 mg 150 mL/hr over 60 Minutes Intravenous Every 24 hours 06/02/15 0012 06/02/15 1050   06/02/15 1100  vancomycin (VANCOCIN) 500 mg in sodium chloride 0.9 % 100 mL IVPB     500 mg 100 mL/hr over 60 Minutes Intravenous 2 times daily 06/02/15 1050     06/02/15 0700  clindamycin (CLEOCIN) IVPB 600 mg     600 mg 100 mL/hr over 30 Minutes Intravenous 4 times per day 06/02/15 0645     06/02/15 0600  piperacillin-tazobactam (ZOSYN) IVPB 3.375 g     3.375 g 12.5 mL/hr over 240 Minutes Intravenous Every 8 hours 06/02/15 0012     06/01/15 2200  vancomycin (VANCOCIN) IVPB 1000 mg/200 mL premix     1,000 mg 200 mL/hr over 60 Minutes Intravenous STAT 06/01/15 2135 06/01/15 2308   06/01/15 2145  piperacillin-tazobactam (ZOSYN) IVPB 3.375 g     3.375 g 100 mL/hr over 30 Minutes Intravenous STAT 06/01/15 2134 06/01/15 2211   06/01/15 2130  metroNIDAZOLE (FLAGYL) IVPB 500 mg  Status:  Discontinued     500 mg 100 mL/hr over 60 Minutes Intravenous Every 8 hours 06/01/15 2123  06/02/15 0647      Assessment: 34 YOF who continues on Vancomycin for 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 A Vancomycin trough this morning was SUBtherapeutic (VT 10 mcg/ml, goal of 15-20 mcg/ml). Renal function remains stable - SCr 0.67, CrCl~60-70 ml/min.   After the Vancomycin trough was resulted - the Vancomycin dose was given around 1100. Will adjust to a q8h interval and will give this afternoon dose a little early. Zosyn was narrowed to Rocephin - the patient still has GNR pending in abscess cultures.   Goal of Therapy:  Vancomycin trough level 15-20 mcg/ml  Plan:  1. Increase Vancomycin to 500 mg IV every 8 hours 2. Adjust Rocephin to 2g IV every 24 hours 3. Will continue to follow renal function, culture results, LOT, and antibiotic de-escalation plans   Alycia Rossetti, PharmD, BCPS Clinical Pharmacist Pager: [phone] 06/04/2015 11:52 AM

## 2015-06-04 NOTE — Progress Notes (Signed)
Consent obtained for incision and debridement tomorrow 06/05/15 of left leg for left leg infection with Dr. Erlinda Hong. Spanish interpreter Spero Geralds present for interpretation with husband Ruthell Rummage took place in room with patient. Both patient and husband consented for surgery. Signature obtained. Consent placed in chart. Appropriate questions answered.

## 2015-06-04 NOTE — H&P (Signed)

## 2015-06-05 ENCOUNTER — Inpatient Hospital Stay (HOSPITAL_COMMUNITY): Payer: Medicaid Other

## 2015-06-05 ENCOUNTER — Inpatient Hospital Stay (HOSPITAL_COMMUNITY): Payer: Medicaid Other | Admitting: Anesthesiology

## 2015-06-05 ENCOUNTER — Encounter (HOSPITAL_COMMUNITY)
Admission: EM | Disposition: A | Discharge status: Rehabilitation Facility | Payer: Self-pay | Source: Home / Self Care | Attending: Internal Medicine

## 2015-06-05 ENCOUNTER — Encounter (HOSPITAL_COMMUNITY): Payer: Self-pay | Admitting: Certified Registered Nurse Anesthetist

## 2015-06-05 DIAGNOSIS — Z9911 Dependence on respirator [ventilator] status: Secondary | ICD-10-CM

## 2015-06-05 DIAGNOSIS — E1165 Type 2 diabetes mellitus with hyperglycemia: Secondary | ICD-10-CM

## 2015-06-05 DIAGNOSIS — A4101 Sepsis due to Methicillin susceptible Staphylococcus aureus: Secondary | ICD-10-CM | POA: Diagnosis present

## 2015-06-05 DIAGNOSIS — E1152 Type 2 diabetes mellitus with diabetic peripheral angiopathy with gangrene: Secondary | ICD-10-CM

## 2015-06-05 DIAGNOSIS — B9620 Unspecified Escherichia coli [E. coli] as the cause of diseases classified elsewhere: Secondary | ICD-10-CM

## 2015-06-05 DIAGNOSIS — R7881 Bacteremia: Secondary | ICD-10-CM

## 2015-06-05 HISTORY — PX: I & D EXTREMITY: SHX5045

## 2015-06-05 LAB — BASIC METABOLIC PANEL
Anion gap: 5 (ref 5–15)
BUN: 9 mg/dL (ref 6–20)
CO2: 31 mmol/L (ref 22–32)
Calcium: 7.4 mg/dL — ABNORMAL LOW (ref 8.9–10.3)
Chloride: 109 mmol/L (ref 101–111)
Creatinine, Ser: 0.59 mg/dL (ref 0.44–1.00)
GFR calc Af Amer: >60 mL/min (ref >60)
GLUCOSE: 141 mg/dL — AB (ref 65–99)
POTASSIUM: 3.3 mmol/L — AB (ref 3.5–5.1)
Sodium: 145 mmol/L (ref 135–145)

## 2015-06-05 LAB — GLUCOSE, CAPILLARY
GLUCOSE-CAPILLARY: 143 mg/dL — AB (ref 65–99)
GLUCOSE-CAPILLARY: 125 mg/dL — AB (ref 65–99)
GLUCOSE-CAPILLARY: 140 mg/dL — AB (ref 65–99)
GLUCOSE-CAPILLARY: 184 mg/dL — AB (ref 65–99)
GLUCOSE-CAPILLARY: 221 mg/dL — AB (ref 65–99)
Glucose-Capillary: 141 mg/dL — ABNORMAL HIGH (ref 65–99)

## 2015-06-05 SURGERY — IRRIGATION AND DEBRIDEMENT EXTREMITY
Anesthesia: General | Site: Leg Lower | Laterality: Left

## 2015-06-05 MED ORDER — EPHEDRINE SULFATE 50 MG/ML IJ SOLN
INTRAMUSCULAR | Status: DC | PRN
  Administered 2015-06-05: 5 mg via INTRAVENOUS
  Administered 2015-06-05: 10 mg via INTRAVENOUS
  Administered 2015-06-05: 5 mg via INTRAVENOUS
  Administered 2015-06-05: 10 mg via INTRAVENOUS
  Administered 2015-06-05: 5 mg via INTRAVENOUS

## 2015-06-05 MED ORDER — CEFAZOLIN SODIUM-DEXTROSE 2-3 GM-% IV SOLR
INTRAVENOUS | Status: AC
  Filled 2015-06-05: qty 50

## 2015-06-05 MED ORDER — SODIUM CHLORIDE 0.9 % IR SOLN
Status: DC | PRN
  Administered 2015-06-05: 3000 mL

## 2015-06-05 MED ORDER — 0.9 % SODIUM CHLORIDE (POUR BTL) OPTIME
TOPICAL | Status: DC | PRN
  Administered 2015-06-05: 1000 mL

## 2015-06-05 MED ORDER — STERILE WATER FOR INJECTION IJ SOLN
INTRAMUSCULAR | Status: AC
  Filled 2015-06-05: qty 10

## 2015-06-05 MED ORDER — POTASSIUM CHLORIDE 10 MEQ/50ML IV SOLN
10.0000 meq | INTRAVENOUS | Status: AC
  Administered 2015-06-05 (×4): 10 meq via INTRAVENOUS
  Filled 2015-06-05 (×4): qty 50

## 2015-06-05 MED ORDER — FENTANYL CITRATE (PF) 250 MCG/5ML IJ SOLN
INTRAMUSCULAR | Status: AC
  Filled 2015-06-05: qty 5

## 2015-06-05 MED ORDER — SUCCINYLCHOLINE CHLORIDE 20 MG/ML IJ SOLN
INTRAMUSCULAR | Status: AC
  Filled 2015-06-05: qty 1

## 2015-06-05 MED ORDER — LACTATED RINGERS IV SOLN
INTRAVENOUS | Status: DC | PRN
  Administered 2015-06-05: 15:00:00 via INTRAVENOUS

## 2015-06-05 MED ORDER — MIDAZOLAM HCL 5 MG/5ML IJ SOLN
INTRAMUSCULAR | Status: DC | PRN
  Administered 2015-06-05: 2 mg via INTRAVENOUS

## 2015-06-05 MED ORDER — FENTANYL CITRATE (PF) 100 MCG/2ML IJ SOLN
INTRAMUSCULAR | Status: DC | PRN
  Administered 2015-06-05: 50 ug via INTRAVENOUS

## 2015-06-05 MED ORDER — CEFAZOLIN SODIUM-DEXTROSE 2-3 GM-% IV SOLR
2.0000 g | Freq: Three times a day (TID) | INTRAVENOUS | Status: DC
  Administered 2015-06-05 – 2015-06-12 (×22): 2 g via INTRAVENOUS
  Filled 2015-06-05 (×24): qty 50

## 2015-06-05 MED ORDER — VANCOMYCIN HCL 1000 MG IV SOLR
INTRAVENOUS | Status: AC
  Filled 2015-06-05: qty 1000

## 2015-06-05 MED ORDER — EPHEDRINE SULFATE 50 MG/ML IJ SOLN
INTRAMUSCULAR | Status: AC
  Filled 2015-06-05: qty 1

## 2015-06-05 MED ORDER — MIDAZOLAM HCL 2 MG/2ML IJ SOLN
INTRAMUSCULAR | Status: AC
  Filled 2015-06-05: qty 2

## 2015-06-05 SURGICAL SUPPLY — 63 items
BANDAGE ELASTIC 3 VELCRO ST LF (GAUZE/BANDAGES/DRESSINGS) IMPLANT
BLADE SURG 10 STRL SS (BLADE) ×1 IMPLANT
BNDG COHESIVE 1X5 TAN STRL LF (GAUZE/BANDAGES/DRESSINGS) IMPLANT
BNDG COHESIVE 4X5 TAN STRL (GAUZE/BANDAGES/DRESSINGS) ×1 IMPLANT
BNDG COHESIVE 6X5 TAN STRL LF (GAUZE/BANDAGES/DRESSINGS) ×2 IMPLANT
BNDG CONFORM 3 STRL LF (GAUZE/BANDAGES/DRESSINGS) IMPLANT
BNDG GAUZE STRTCH 6 (GAUZE/BANDAGES/DRESSINGS) ×3 IMPLANT
CANISTER WOUND CARE 500ML ATS (WOUND CARE) ×2 IMPLANT
CORDS BIPOLAR (ELECTRODE) IMPLANT
COVER SURGICAL LIGHT HANDLE (MISCELLANEOUS) ×3 IMPLANT
CUFF TOURNIQUET SINGLE 24IN (TOURNIQUET CUFF) IMPLANT
CUFF TOURNIQUET SINGLE 34IN LL (TOURNIQUET CUFF) ×2 IMPLANT
CUFF TOURNIQUET SINGLE 44IN (TOURNIQUET CUFF) IMPLANT
DRAPE EXTREMITY BILATERAL (DRAPE) IMPLANT
DRAPE IMP U-DRAPE 54X76 (DRAPES) IMPLANT
DRAPE INCISE IOBAN 66X45 STRL (DRAPES) ×6 IMPLANT
DRAPE SURG 17X23 STRL (DRAPES) IMPLANT
DRAPE U-SHAPE 47X51 STRL (DRAPES) ×3 IMPLANT
DRSG MEPITEL 4X7.2 (GAUZE/BANDAGES/DRESSINGS) ×2 IMPLANT
DRSG VAC ATS MED SENSATRAC (GAUZE/BANDAGES/DRESSINGS) ×2 IMPLANT
DURAPREP 26ML APPLICATOR (WOUND CARE) ×3 IMPLANT
ELECT CAUTERY BLADE 6.4 (BLADE) ×1 IMPLANT
ELECT REM PT RETURN 9FT ADLT (ELECTROSURGICAL)
ELECTRODE REM PT RTRN 9FT ADLT (ELECTROSURGICAL) IMPLANT
FACESHIELD WRAPAROUND (MASK) ×3 IMPLANT
FACESHIELD WRAPAROUND OR TEAM (MASK) IMPLANT
GAUZE SPONGE 4X4 12PLY STRL (GAUZE/BANDAGES/DRESSINGS) ×2 IMPLANT
GAUZE XEROFORM 1X8 LF (GAUZE/BANDAGES/DRESSINGS) ×3 IMPLANT
GAUZE XEROFORM 5X9 LF (GAUZE/BANDAGES/DRESSINGS) ×3 IMPLANT
GLOVE NEODERM STRL 7.5 LF PF (GLOVE) ×2 IMPLANT
GLOVE SURG NEODERM 7.5  LF PF (GLOVE) ×4
GOWN STRL REIN XL XLG (GOWN DISPOSABLE) ×6 IMPLANT
HANDPIECE INTERPULSE COAX TIP (DISPOSABLE) ×3
KIT BASIN OR (CUSTOM PROCEDURE TRAY) ×3 IMPLANT
KIT ROOM TURNOVER OR (KITS) ×3 IMPLANT
MANIFOLD NEPTUNE II (INSTRUMENTS) ×3 IMPLANT
NS IRRIG 1000ML POUR BTL (IV SOLUTION) ×6 IMPLANT
PACK ORTHO EXTREMITY (CUSTOM PROCEDURE TRAY) ×3 IMPLANT
PAD ABD 8X10 STRL (GAUZE/BANDAGES/DRESSINGS) ×3 IMPLANT
PAD ARMBOARD 7.5X6 YLW CONV (MISCELLANEOUS) ×6 IMPLANT
PADDING CAST ABS 4INX4YD NS (CAST SUPPLIES)
PADDING CAST ABS COTTON 4X4 ST (CAST SUPPLIES) ×2 IMPLANT
PADDING CAST COTTON 6X4 STRL (CAST SUPPLIES) ×1 IMPLANT
SET HNDPC FAN SPRY TIP SCT (DISPOSABLE) IMPLANT
SPONGE LAP 18X18 X RAY DECT (DISPOSABLE) ×6 IMPLANT
STOCKINETTE IMPERVIOUS 9X36 MD (GAUZE/BANDAGES/DRESSINGS) ×1 IMPLANT
SUT ETHILON 2 0 FS 18 (SUTURE) ×3 IMPLANT
SUT ETHILON 2 0 PSLX (SUTURE) ×1 IMPLANT
SUT ETHILON 3 0 PS 1 (SUTURE) ×6 IMPLANT
SUT MON AB 2-0 CT1 36 (SUTURE) ×2 IMPLANT
SUT VIC AB 2-0 CT1 36 (SUTURE) ×1 IMPLANT
SUT VIC AB 2-0 FS1 27 (SUTURE) ×2 IMPLANT
SYR CONTROL 10ML LL (SYRINGE) IMPLANT
TOWEL OR 17X24 6PK STRL BLUE (TOWEL DISPOSABLE) ×3 IMPLANT
TOWEL OR 17X26 10 PK STRL BLUE (TOWEL DISPOSABLE) ×3 IMPLANT
TUBE ANAEROBIC SPECIMEN COL (MISCELLANEOUS) IMPLANT
TUBE CONNECTING 12'X1/4 (SUCTIONS) ×1
TUBE CONNECTING 12X1/4 (SUCTIONS) ×2 IMPLANT
TUBE FEEDING 5FR 15 INCH (TUBING) IMPLANT
TUBING CYSTO DISP (UROLOGICAL SUPPLIES) ×1 IMPLANT
UNDERPAD 30X30 INCONTINENT (UNDERPADS AND DIAPERS) ×6 IMPLANT
WATER STERILE IRR 1000ML POUR (IV SOLUTION) ×1 IMPLANT
YANKAUER SUCT BULB TIP NO VENT (SUCTIONS) ×3 IMPLANT

## 2015-06-05 NOTE — Progress Notes (Signed)
Patient returned from OR and placed back on previous ventilator settings. Patient vitals stable. Respiratory therapist will continue to monitor.

## 2015-06-05 NOTE — Transfer of Care (Signed)
Immediate Anesthesia Transfer of Care Note  Patient: [name]  Procedure(s) Performed: Procedure(s): IRRIGATION AND DEBRIDEMENT LEFT LEG, POSSIBLE CLOSURE BELOW KNEE AMPUTATION (Left)  Patient Location: ICU  Anesthesia Type:General  Level of Consciousness: sedated and Patient remains intubated per anesthesia plan  Airway & Oxygen Therapy: Patient remains intubated per anesthesia plan and Patient placed on Ventilator (see vital sign flow sheet for setting)  Post-op Assessment: Report given to RN and Post -op Vital signs reviewed and stable  Post vital signs: Reviewed and stable  Last Vitals:  Filed Vitals:   06/05/15 1400  BP: 125/73  Pulse: 74  Temp:   Resp: 15    Complications: No apparent anesthesia complications

## 2015-06-05 NOTE — Progress Notes (Signed)
*  PRELIMINARY RESULTS* Echocardiogram 2D Echocardiogram has been performed.  [name] 06/05/2015, 3:05 PM

## 2015-06-05 NOTE — Progress Notes (Signed)
PULMONARY / CRITICAL CARE MEDICINE   Name: [name] MRN: [id number] DOB: 1965/12/27    ADMISSION DATE:  06/01/2015  REFERRING MD :  ER  CHIEF COMPLAINT:  Left Foot Pain  BRIEF DESCRIPTION:  49 yo female with hx of DM presented to ER with Lt foot pain x one week associated with open wound.  Found to have septic shock and gangrene of Lt lower extremity.  STUDIES:  7/02 Xray Lt foot >> extensive soft tissue gas Urine 7/02 >> E. Coli Blood 7/02 >> GPC in clusters Wound 7/03 >> GPC in pairs, GN rods  SIGNIFICANT EVENTS: 7/02 Admit, ortho consulted 7/03 Lt Chopart amputation 7/04 Lt leg amputation through tibia, fibula 7/05 Return to OR for further debridement  SUBJECTIVE:  No acute complaints overnight. Denies pain.  VITAL SIGNS: Temp:  [98.2 F (36.8 C)-100 F (37.8 C)] 98.3 F (36.8 C) (07/06 0352) Pulse Rate:  [32-95] 79 (07/06 0600) Resp:  [13-20] 15 (07/06 0600) BP: (90-136)/(48-70) 131/67 mmHg (07/06 0600) SpO2:  [100 %] 100 % (07/06 0600) Arterial Line BP: (90-142)/(39-60) 135/56 mmHg (07/06 0500) FiO2 (%):  [30 %] 30 % (07/06 0600) HEMODYNAMICS: CVP:  [8 mmHg] 8 mmHg VENTILATOR SETTINGS: Vent Mode:  [-] PRVC FiO2 (%):  [30 %] 30 % Set Rate:  [14 bmp] 14 bmp Vt Set:  [350 mL] 350 mL PEEP:  [5 cmH20] 5 cmH20 Plateau Pressure:  [12 cmH20-14 cmH20] 12 cmH20 INTAKE / OUTPUT:  Intake/Output Summary (Last 24 hours) at 06/05/15 0725 Last data filed at 06/05/15 0600  Gross per 24 hour  Intake 2513.08 ml  Output   1690 ml  Net 823.08 ml    PHYSICAL EXAMINATION: General: 49yo female, alert, on vent Neuro:  Alert, moves extremities spontaneously HEENT:  ETT in place, MMM Cardiovascular:  S1 and S2 noted, no murmur, regular rate and rhythm Lungs: CTAB, no wheezes, on Vent Abdomen:  Soft, non-distended, non tender Musculoskeletal: 1+ edema Skin:  Lt BKA, wound dressing clean  LABS:  CBC  Recent Labs Lab 06/02/15 0415 06/03/15 0100  06/03/15 0805 06/04/15 0357  WBC 20.8* 12.4*  --  12.1*  HGB 8.4* 9.8* 10.2* 8.4*  HCT 24.3* 27.8* 30.0* 24.1*  PLT 555* 428*  --  366   Coag's  Recent Labs Lab 06/02/15 0415  APTT 31  INR 1.43   BMET  Recent Labs Lab 06/03/15 1150 06/04/15 0357 06/05/15 0440  NA 152* 151* 145  K 3.1* 3.4* 3.3*  CL 116* 114* 109  CO2 29 31 31   BUN 12 9 9   CREATININE 0.77 0.67 0.59  GLUCOSE 265* 178* 141*   Electrolytes   Recent Labs Lab 06/02/15 0415 06/03/15 0100 06/03/15 1150 06/04/15 0357 06/05/15 0440  CALCIUM 6.9* 7.1* 6.9* 7.2* 7.4*  MG 1.9  1.9 1.7 2.0 1.9  --   PHOS 3.2 2.7  --   --   --    Sepsis Markers  Recent Labs Lab 06/02/15 0415  06/02/15 1430 06/02/15 1820 06/03/15 0100  LATICACIDVEN 1.5  < > 1.3 1.2 1.3  PROCALCITON 14.82  --   --   --   --   < > = values in this interval not displayed. ABG  Recent Labs Lab 06/03/15 0423 06/03/15 0805 06/04/15 0350  PHART 7.401 7.486* 7.435  PCO2ART 41.7 36.1 46.8*  PO2ART 84.3 220.0* 116.0*   Liver Enzymes  Recent Labs Lab 06/01/15 2129 06/02/15 0415 06/03/15 0100  AST 28 29 16   ALT 22 22 18  ALKPHOS 193* 165* 140*  BILITOT 1.6* 1.2 1.1  ALBUMIN 2.3* 1.8* 1.9*   Cardiac Enzymes  Recent Labs Lab 06/02/15 0415 06/02/15 0923 06/02/15 1520  TROPONINI <0.03 0.03 0.06*   Glucose  Recent Labs Lab 06/04/15 0827 06/04/15 1212 06/04/15 1549 06/04/15 2001 06/04/15 2329 06/05/15 0341  GLUCAP 134* 141* 169* 198* 143* 125*    Imaging No results found. ASSESSMENT / PLAN:  PULMONARY ETT 7/03 >> A: Acute respiratory failure 2nd to septic shock and acidosis.  P:   Full vent support Will continue on vent. OR today and will attempt extubation  CARDIOVASCULAR Lt [place] CVL 7/03 >> A:  Septic shock. Hx of HTN, HLD.  P:  Currently off of pressors Monitor BP  RENAL A:   AKI 2nd to septic shock. Lactic acidosis--resolved. Hypokalemia--Improving (2.6>3.4>3.3)  P:   Volume  resuscitate Monitor renal fx, urine outpt Replace electrolytes as needed  GASTROINTESTINAL A:   Nutrition. Nausea/Vomiting  P:   Tube feeds while on vent Pepcid Reglan  HEMATOLOGIC A:   Anemia of critical illness and chronic disease.  P:  F/u CBC SCD for DVT prophylaxis. OR today , but consider initiating pharmacologic prophylaxis post-op  INFECTIOUS A:   Septic shock 2nd to Lt lower extremity gangrene s/p amputation. Candidal vulvovaginitis.  P:   Post-op/wound care per ortho, wound care team Contact isolation due to concern for necrotizing fasciitis Diflucan x one on 7/04, lotrimin cream To OR again 7/6 for further debridement  Clindamycin 7/3>>7/5 Zosyn 7/2>>7/5 Vancomycin 7/2>>7/6 *CTX 7/5>> Clotrimazole vaginal cream, Completed Diflucan x1 7/4  ENDOCRINE A:   DM type II.  P:   Resistant Insulin Sliding Scale Holding Lantus tomorrow morning due to surgery. Will restart post-op  NEUROLOGIC A:   Acute encephalopathy 2nd to septic shock.  P:   RASS goal: -1 Fentanyl, Versed  Today's Summary: Going to OR today at 4:15pm for further debridement. May extubate afterwards.   Dr. Junie Panning, DO Family Medicine, PGY-2  06/05/2015, 7:25 AM  PCCM ATTENDING: I have reviewed pt's initial presentation, consultants notes and hospital database in detail.  The above assessment and plan was formulated under my direction.   Merton Border, MD;  PCCM service; [place] [phone]

## 2015-06-05 NOTE — Anesthesia Postprocedure Evaluation (Signed)
  Anesthesia Post-op Note  Patient: [name]  Procedure(s) Performed: Procedure(s): IRRIGATION AND DEBRIDEMENT LEFT LEG, POSSIBLE CLOSURE BELOW KNEE AMPUTATION (Left)  Patient Location: SICU  Anesthesia Type:General  Level of Consciousness: sedated and Patient remains intubated per anesthesia plan  Airway and Oxygen Therapy: Patient Spontanous Breathing  Post-op Pain: none  Post-op Assessment: Post-op Vital signs reviewed              Post-op Vital Signs: stable  Last Vitals:  Filed Vitals:   06/05/15 1700  BP: 162/69  Pulse: 86  Temp:   Resp: 0    Complications: No apparent anesthesia complications

## 2015-06-05 NOTE — Anesthesia Preprocedure Evaluation (Signed)
Anesthesia Evaluation  Patient identified by MRN, date of birth, ID band Patient unresponsive    Reviewed: Allergy & Precautions, NPO status , Patient's Chart, lab work & pertinent test results  History of Anesthesia Complications Negative for: history of anesthetic complications  Airway Mallampati: Intubated       Dental no notable dental hx.    Pulmonary neg pulmonary ROS,  Patient intubated breath sounds clear to auscultation  Pulmonary exam normal       Cardiovascular Normal cardiovascular examRhythm:Regular Rate:Normal     Neuro/Psych negative neurological ROS  negative psych ROS   GI/Hepatic negative GI ROS, Neg liver ROS,   Endo/Other  diabetes, Type 2, Insulin DependentLast glucose 310 on insulin drip  Renal/GU      Musculoskeletal   Abdominal   Peds  Hematology  (+) anemia ,   Anesthesia Other Findings   Reproductive/Obstetrics negative OB ROS                             Anesthesia Physical Anesthesia Plan  ASA: III  Anesthesia Plan: General ETT and General   Post-op Pain Management:    Induction: Intravenous  Airway Management Planned:   Additional Equipment:   Intra-op Plan:   Post-operative Plan:   Informed Consent: I have reviewed the patients History and Physical, chart, labs and discussed the procedure including the risks, benefits and alternatives for the proposed anesthesia with the patient or authorized representative who has indicated his/her understanding and acceptance.     Plan Discussed with: CRNA and Surgeon  Anesthesia Plan Comments:         Anesthesia Quick Evaluation

## 2015-06-05 NOTE — Anesthesia Procedure Notes (Signed)
Date/Time: 06/05/2015 3:27 PM Performed by: Garrison Columbus T Pre-anesthesia Checklist: Patient identified, Emergency Drugs available, Suction available and Patient being monitored Patient Re-evaluated:Patient Re-evaluated prior to inductionOxygen Delivery Method: Circle system utilized Preoxygenation: Pre-oxygenation with 100% oxygen Intubation Type: Inhalational induction Placement Confirmation: positive ETCO2 and breath sounds checked- equal and bilateral Tube secured with: Tape Dental Injury: Teeth and Oropharynx as per pre-operative assessment

## 2015-06-05 NOTE — Op Note (Signed)
   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 Sharp excisional debridement of bone, muscle, fascia, skin 7 x 21 cm 2. Adjacent tissue rearrangement of left lower leg 7 x 7 cm 3. Application of negative wound therapy greater than 50 cm  Operative findings:  1. Necrotic muscle of the lateral muscular compartment without gross purulence. 2. Necrotic anterior skin edge  Surgeon: Eduard Roux, M.D.  Anesthesia: Gen.  Estimated blood loss: 50 mL  Complications: None  Condition to ICU: Stable  Indications for procedure: The patient returns back today for repeat washout and inspection of her lower leg.  Her husband was aware of the risks benefits alternatives to surgery he wished to proceed. Consent was signed.  Disruption of procedure: The patient was transferred directly from the ICU intubated to the operating room. The operative site was marked by the surgeon. The consent confirm the operative procedure and site. A timeout was performed. The left lower extremity was prepped and draped in standard sterile fashion. Preoperative antibiosis were given. The wound was opened back up for inspection. There was still necrotic muscle of the lateral muscular compartment. This was sharply debrided back to a bleeding surface. The anterior skin edge also was necrotic. Sharp excisional debridement of the bone, muscle, subcutaneous tissue, skin was carried out with a knife and rongeur. After we felt that there was adequate debridement we then thoroughly irrigated the wound with pulse lavage. We were then able to close the medial one third portion of the wound by performing adjacent tissue rearrangement. This was closed in layer fashion using 0 PDS, 2-0 Monocryl, 3-0 nylon. The rest of the wound was treated with a wound VAC. The patient  tolerated the procedure well and was transferred to the ICU in stable condition. All sponge counts were correct.  Disposition: The patient will return back to the operating room on Saturday for a repeat I&D. If she exhibits no more across this or signs of infection and we will plan on closing the rest of the wound.  She will need to remain on broad-spectrum antibiotically until speciation of her bacteria. I recommend an infectious disease consult.  [name], MD [place] 4:34 PM

## 2015-06-05 NOTE — Consult Note (Signed)
[facility] for Infectious Disease    Date of Admission:  06/01/2015    Total days of antibiotics 3               Reason for Consult: Staph aureus bacteremia complicating left foot gangrene    Referring Physician: Automatic consultation for staph aureus bacteremia  Principal Problem:   Staphylococcus aureus bacteremia with sepsis Active Problems:   Gangrene of foot   Septic shock   Diabetic ulcer of left foot associated with type 1 diabetes mellitus   Respiratory failure   . antiseptic oral rinse  7 mL Mouth Rinse QID  . cefTRIAXone (ROCEPHIN)  IV  2 g Intravenous Q24H  . chlorhexidine  15 mL Mouth Rinse BID  . clotrimazole  1 Applicatorful Vaginal QHS  . famotidine (PEPCID) IV  20 mg Intravenous Q24H  . feeding supplement (PRO-STAT 64)  30 mL Per Tube q morning - 10a  . insulin aspart  0-20 Units Subcutaneous 6 times per day  . metoCLOPramide (REGLAN) injection  5 mg Intravenous 3 times per day  . pneumococcal 23 valent vaccine  0.5 mL Intramuscular Tomorrow-1000  . potassium chloride  10 mEq Intravenous Q1 Hr x 4    Recommendations: 1. Change ceftriaxone to cefazolin 2. Repeat blood cultures 3. Transthoracic echocardiogram   Assessment: She has staph aureus bacteremia complicating a severe left foot infection. She has improved following surgery and initial antibiotics therapy. I will repeat her blood cultures and obtain a transthoracic echocardiogram. I will change antibiotics therapy to IV cefazolin.    HPI: [name] is a 49 y.o. female with poorly controlled diabetes who was admitted on 06/02/2015 with left foot gangrene and sepsis. 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 Operative cultures and admission blood cultures are growing MSSA.    Review of Systems: Pertinent items are noted in HPI.  Past Medical History  Diagnosis Date  . Diabetes mellitus   . 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 Janie Morning), Cysto performed by Dr. Jonna Munro 12/2009 with evac of clots and bx and fulguration. // H/O stage 2 SCC of the vulva  . Anemia     2/2 blood loss  . Diabetes mellitus type 2, uncontrolled DX: 2001  . 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 Diabetic foot ulcers   . Cataract   . Diabetes mellitus without complication     History  Substance Use Topics  . Smoking status: Never Smoker   . Smokeless tobacco: Never Used  . Alcohol Use: No    Family History  Problem Relation Age of Onset  . Diabetes Mother   . Diabetes Father   . Diabetes Brother    No Known Allergies  OBJECTIVE: Blood pressure 138/70, pulse 68, temperature 97.5 F (36.4 C), temperature source Oral, resp. rate 14, height 4\' 11"  (1.499 m), weight 119 lb 14.9 oz (54.4 kg), last menstrual period 05/12/2013, SpO2 100 %. General: She is alert on the ventilator Skin: No rash, splinter or conjunctival hemorrhages Lungs: Clear Cor: Regular S1 and S2 with no murmurs Left leg: Ace wrap on BKA stump. Drain remains in.  Lab Results Lab Results  Component Value Date   WBC 12.1* 06/04/2015   HGB 8.4* 06/04/2015   HCT 24.1* 06/04/2015   MCV 83.4 06/04/2015   PLT 366 06/04/2015    Lab Results  Component Value Date   CREATININE 0.59 06/05/2015   BUN 9 06/05/2015   NA 145 06/05/2015   K 3.3* 06/05/2015   CL 109 06/05/2015   CO2 31 06/05/2015    Lab Results  Component Value Date   ALT 18 06/03/2015   AST 16 06/03/2015   ALKPHOS 140* 06/03/2015   BILITOT 1.1 06/03/2015     Microbiology: Recent Results (from the past 240 hour(s))  Blood Culture (routine x 2)     Status: None (Preliminary result)   Collection Time: 06/01/15  9:29 PM  Result Value Ref Range Status    Specimen Description BLOOD RIGHT ANTECUBITAL  Final   Special Requests BOTTLES DRAWN AEROBIC AND ANAEROBIC 5CC  Final   Culture  Setup Time   Final    GRAM POSITIVE COCCI IN CLUSTERS CRITICAL RESULT CALLED TO, READ BACK BY AND VERIFIED WITH: Talbert Cage [phone] [place] IN BOTH AEROBIC AND ANAEROBIC BOTTLES    Culture   Final    STAPHYLOCOCCUS AUREUS Performed at  [facility]    Report Status PENDING  Incomplete   Organism ID, Bacteria STAPHYLOCOCCUS AUREUS  Final      Susceptibility   Staphylococcus aureus - MIC*    CIPROFLOXACIN <=0.5 SENSITIVE Sensitive     ERYTHROMYCIN >=8 RESISTANT Resistant     GENTAMICIN <=0.5 SENSITIVE Sensitive     OXACILLIN <=0.25 SENSITIVE Sensitive     TETRACYCLINE >=16 RESISTANT Resistant     VANCOMYCIN <=0.5 SENSITIVE Sensitive     TRIMETH/SULFA <=10 SENSITIVE Sensitive     CLINDAMYCIN <=0.25 RESISTANT Resistant     RIFAMPIN <=0.5 SENSITIVE Sensitive     Inducible Clindamycin POSITIVE Resistant     * STAPHYLOCOCCUS AUREUS  Blood Culture (routine x 2)     Status: None (Preliminary result)   Collection Time: 06/01/15  9:29 PM  Result Value Ref Range Status   Specimen Description BLOOD LEFT ANTECUBITAL  Final   Special Requests BOTTLES DRAWN AEROBIC AND ANAEROBIC 5CC  Final   Culture  Setup Time   Final    GRAM POSITIVE COCCI IN CLUSTERS IN BOTH AEROBIC AND ANAEROBIC BOTTLES CRITICAL RESULT CALLED TO, READ BACK BY AND VERIFIED WITH: S DENNY,RN [phone] CONFIRMED BY K WILDER    Culture   Final    STAPHYLOCOCCUS AUREUS SUSCEPTIBILITIES PERFORMED ON PREVIOUS CULTURE WITHIN THE LAST 5 DAYS. Performed at [facility]    Report Status PENDING  Incomplete  Urine culture     Status: None   Collection Time: 06/01/15  9:44 PM  Result Value Ref Range Status   Specimen Description URINE, CATHETERIZED  Final   Special Requests NONE  Final   Culture   Final    >=100,000 COLONIES/mL ESCHERICHIA COLI Performed at [facility]    Report Status 06/04/2015 FINAL  Final   Organism ID, Bacteria ESCHERICHIA COLI  Final      Susceptibility   Escherichia coli - MIC*    AMPICILLIN >=32 RESISTANT Resistant     CEFAZOLIN <=4 SENSITIVE Sensitive     CEFTRIAXONE <=1 SENSITIVE Sensitive     CIPROFLOXACIN <=0.25 SENSITIVE Sensitive     GENTAMICIN <=1 SENSITIVE Sensitive     IMIPENEM <=0.25 SENSITIVE Sensitive     NITROFURANTOIN <=16 SENSITIVE Sensitive  TRIMETH/SULFA <=20 SENSITIVE Sensitive     AMPICILLIN/SULBACTAM 16 INTERMEDIATE Intermediate     PIP/TAZO <=4 SENSITIVE Sensitive     * >=100,000 COLONIES/mL ESCHERICHIA COLI  Gram stain     Status: None   Collection Time: 06/02/15  1:30 AM  Result Value Ref Range Status   Specimen Description ABSCESS L FOOT  Final   Special Requests NONE  Final   Gram Stain   Final    ABUNDANT GRAM POSITIVE COCCI IN PAIRS FEW WBC PRESENT, PREDOMINANTLY PMN Gram Stain Report Called to,Read Back By and Verified With: Dollene Cleveland RN @ [phone] ON 06/02/15 BY C DAVIS    Report Status 06/02/2015 FINAL  Final  Culture, routine-abscess     Status: None (Preliminary result)   Collection Time: 06/02/15  1:30 AM  Result Value Ref Range Status   Specimen Description ABSCESS L FOOT A  Final   Special Requests NONE  Final   Gram Stain   Final    FEW WBC PRESENT, PREDOMINANTLY PMN ABUNDANT GRAM POSITIVE COCCI IN PAIRS Gram Stain Report Called to,Read Back By and Verified With: Gram Stain Report Called to,Read Back By and Verified With: Dollene Cleveland RN @ [phone] ON 06/02/15 BY C DAVIS Performed by [facility] Performed at [facility]    Culture   Final    MODERATE STAPHYLOCOCCUS AUREUS Note: RIFAMPIN AND GENTAMICIN SHOULD NOT BE USED AS SINGLE DRUGS FOR TREATMENT OF STAPH INFECTIONS. Performed at [facility]    Report Status PENDING  Incomplete  Culture, routine-abscess     Status: None (Preliminary result)   Collection Time: 06/02/15  1:30 AM  Result Value Ref  Range Status   Specimen Description ABSCESS L FOOT B  Final   Special Requests NONE  Final   Gram Stain   Final    FEW WBC PRESENT,BOTH PMN AND MONONUCLEAR NO SQUAMOUS EPITHELIAL CELLS SEEN ABUNDANT GRAM NEGATIVE RODS MODERATE GRAM POSITIVE COCCI IN PAIRS IN CLUSTERS    Culture   Final    MODERATE STAPHYLOCOCCUS AUREUS Note: RIFAMPIN AND GENTAMICIN SHOULD NOT BE USED AS SINGLE DRUGS FOR TREATMENT OF STAPH INFECTIONS. Performed at [facility]    Report Status PENDING  Incomplete  Anaerobic culture     Status: None (Preliminary result)   Collection Time: 06/02/15  1:30 AM  Result Value Ref Range Status   Specimen Description ABSCESS L FOOT A  Final   Special Requests NONE  Final   Gram Stain   Final    MODERATE WBC PRESENT,BOTH PMN AND MONONUCLEAR NO SQUAMOUS EPITHELIAL CELLS SEEN ABUNDANT GRAM NEGATIVE RODS ABUNDANT GRAM POSITIVE COCCI IN PAIRS IN CLUSTERS    Culture   Final    NO ANAEROBES ISOLATED; CULTURE IN PROGRESS FOR 5 DAYS Performed at [facility]    Report Status PENDING  Incomplete  Anaerobic culture     Status: None (Preliminary result)   Collection Time: 06/02/15  1:30 AM  Result Value Ref Range Status   Specimen Description ABSCESS L FOOT B  Final   Special Requests NONE  Final   Gram Stain   Final    MODERATE WBC PRESENT,BOTH PMN AND MONONUCLEAR NO SQUAMOUS EPITHELIAL CELLS SEEN ABUNDANT GRAM NEGATIVE RODS ABUNDANT GRAM POSITIVE COCCI IN PAIRS IN CLUSTERS    Culture   Final    NO ANAEROBES ISOLATED; CULTURE IN PROGRESS FOR 5 DAYS Performed at [facility]    Report Status PENDING  Incomplete  MRSA PCR Screening  Status: None   Collection Time: 06/02/15  3:19 AM  Result Value Ref Range Status   MRSA by PCR NEGATIVE NEGATIVE Final    Comment:        The GeneXpert MRSA Assay (FDA approved for NASAL specimens only), is one component of a comprehensive MRSA colonization surveillance program. It is not intended to  diagnose MRSA infection nor to guide or monitor treatment for MRSA infections.     Michel Bickers, MD [facility] for Infectious [place] [facility] [phone] pager   [phone] cell 06/05/2015, 1:47 PM

## 2015-06-06 ENCOUNTER — Encounter (HOSPITAL_COMMUNITY): Payer: Self-pay | Admitting: Orthopaedic Surgery

## 2015-06-06 DIAGNOSIS — D638 Anemia in other chronic diseases classified elsewhere: Secondary | ICD-10-CM | POA: Diagnosis present

## 2015-06-06 DIAGNOSIS — E1165 Type 2 diabetes mellitus with hyperglycemia: Secondary | ICD-10-CM | POA: Diagnosis present

## 2015-06-06 DIAGNOSIS — G92 Toxic encephalopathy: Secondary | ICD-10-CM | POA: Diagnosis present

## 2015-06-06 DIAGNOSIS — E872 Acidosis: Secondary | ICD-10-CM | POA: Diagnosis present

## 2015-06-06 DIAGNOSIS — Z923 Personal history of irradiation: Secondary | ICD-10-CM | POA: Diagnosis not present

## 2015-06-06 DIAGNOSIS — A4101 Sepsis due to Methicillin susceptible Staphylococcus aureus: Secondary | ICD-10-CM | POA: Diagnosis present

## 2015-06-06 DIAGNOSIS — R6520 Severe sepsis without septic shock: Secondary | ICD-10-CM

## 2015-06-06 DIAGNOSIS — A480 Gas gangrene: Secondary | ICD-10-CM | POA: Diagnosis present

## 2015-06-06 DIAGNOSIS — B373 Candidiasis of vulva and vagina: Secondary | ICD-10-CM | POA: Diagnosis present

## 2015-06-06 DIAGNOSIS — C52 Malignant neoplasm of vagina: Secondary | ICD-10-CM | POA: Diagnosis present

## 2015-06-06 DIAGNOSIS — L97529 Non-pressure chronic ulcer of other part of left foot with unspecified severity: Secondary | ICD-10-CM | POA: Diagnosis present

## 2015-06-06 DIAGNOSIS — N179 Acute kidney failure, unspecified: Secondary | ICD-10-CM | POA: Diagnosis present

## 2015-06-06 DIAGNOSIS — L089 Local infection of the skin and subcutaneous tissue, unspecified: Secondary | ICD-10-CM | POA: Diagnosis present

## 2015-06-06 DIAGNOSIS — Z89512 Acquired absence of left leg below knee: Secondary | ICD-10-CM | POA: Diagnosis not present

## 2015-06-06 DIAGNOSIS — Z794 Long term (current) use of insulin: Secondary | ICD-10-CM | POA: Diagnosis not present

## 2015-06-06 DIAGNOSIS — E785 Hyperlipidemia, unspecified: Secondary | ICD-10-CM | POA: Diagnosis present

## 2015-06-06 DIAGNOSIS — I10 Essential (primary) hypertension: Secondary | ICD-10-CM | POA: Diagnosis present

## 2015-06-06 DIAGNOSIS — E876 Hypokalemia: Secondary | ICD-10-CM | POA: Diagnosis present

## 2015-06-06 DIAGNOSIS — R6521 Severe sepsis with septic shock: Secondary | ICD-10-CM | POA: Diagnosis present

## 2015-06-06 DIAGNOSIS — Z8589 Personal history of malignant neoplasm of other organs and systems: Secondary | ICD-10-CM | POA: Diagnosis not present

## 2015-06-06 DIAGNOSIS — J9600 Acute respiratory failure, unspecified whether with hypoxia or hypercapnia: Secondary | ICD-10-CM | POA: Diagnosis present

## 2015-06-06 DIAGNOSIS — A419 Sepsis, unspecified organism: Secondary | ICD-10-CM | POA: Insufficient documentation

## 2015-06-06 DIAGNOSIS — Z833 Family history of diabetes mellitus: Secondary | ICD-10-CM | POA: Diagnosis not present

## 2015-06-06 DIAGNOSIS — E11621 Type 2 diabetes mellitus with foot ulcer: Secondary | ICD-10-CM | POA: Diagnosis present

## 2015-06-06 LAB — GLUCOSE, CAPILLARY
GLUCOSE-CAPILLARY: 166 mg/dL — AB (ref 65–99)
GLUCOSE-CAPILLARY: 187 mg/dL — AB (ref 65–99)
GLUCOSE-CAPILLARY: 209 mg/dL — AB (ref 65–99)
Glucose-Capillary: 165 mg/dL — ABNORMAL HIGH (ref 65–99)
Glucose-Capillary: 132 mg/dL — ABNORMAL HIGH (ref 65–99)
Glucose-Capillary: 131 mg/dL — ABNORMAL HIGH (ref 65–99)

## 2015-06-06 LAB — CBC
HCT: 25.2 % — ABNORMAL LOW (ref 36.0–46.0)
HEMOGLOBIN: 8.4 g/dL — AB (ref 12.0–15.0)
MCH: 29.3 pg (ref 26.0–34.0)
MCHC: 33.3 g/dL (ref 30.0–36.0)
MCV: 87.8 fL (ref 78.0–100.0)
PLATELETS: 366 10*3/uL (ref 150–400)
RBC: 2.87 MIL/uL — AB (ref 3.87–5.11)
RDW: 14.3 % (ref 11.5–15.5)
WBC: 10.6 10*3/uL — ABNORMAL HIGH (ref 4.0–10.5)

## 2015-06-06 LAB — CULTURE, ROUTINE-ABSCESS

## 2015-06-06 LAB — BASIC METABOLIC PANEL
Anion gap: 8 (ref 5–15)
BUN: <5 mg/dL — ABNORMAL LOW (ref 6–20)
CHLORIDE: 102 mmol/L (ref 101–111)
CO2: 31 mmol/L (ref 22–32)
Calcium: 7.5 mg/dL — ABNORMAL LOW (ref 8.9–10.3)
Creatinine, Ser: 0.47 mg/dL (ref 0.44–1.00)
GLUCOSE: 116 mg/dL — AB (ref 65–99)
POTASSIUM: 3.1 mmol/L — AB (ref 3.5–5.1)
Sodium: 141 mmol/L (ref 135–145)

## 2015-06-06 MED ORDER — OXYCODONE HCL 5 MG PO TABS
5.0000 mg | ORAL_TABLET | Freq: Four times a day (QID) | ORAL | Status: DC | PRN
  Administered 2015-06-06 – 2015-06-07 (×2): 5 mg via ORAL
  Filled 2015-06-06 (×3): qty 1

## 2015-06-06 MED ORDER — BISACODYL 10 MG RE SUPP: 10.0000 mg | Freq: Every day | RECTAL | Status: DC | PRN

## 2015-06-06 MED ORDER — ACETAMINOPHEN 325 MG PO TABS
650.0000 mg | ORAL_TABLET | Freq: Four times a day (QID) | ORAL | Status: DC | PRN
  Administered 2015-06-08: 650 mg via ORAL
  Filled 2015-06-06: qty 2

## 2015-06-06 MED ORDER — POTASSIUM CHLORIDE 10 MEQ/50ML IV SOLN
10.0000 meq | INTRAVENOUS | Status: AC
  Administered 2015-06-06 (×4): 10 meq via INTRAVENOUS
  Filled 2015-06-06 (×4): qty 50

## 2015-06-06 MED ORDER — CETYLPYRIDINIUM CHLORIDE 0.05 % MT LIQD
7.0000 mL | Freq: Two times a day (BID) | OROMUCOSAL | Status: DC
  Administered 2015-06-06 – 2015-06-12 (×10): 7 mL via OROMUCOSAL

## 2015-06-06 MED ORDER — RESOURCE THICKENUP CLEAR PO POWD
ORAL | Status: DC | PRN
  Filled 2015-06-06: qty 125

## 2015-06-06 MED ORDER — ENOXAPARIN SODIUM 40 MG/0.4ML ~~LOC~~ SOLN
40.0000 mg | SUBCUTANEOUS | Status: DC
  Administered 2015-06-06: 40 mg via SUBCUTANEOUS
  Filled 2015-06-06: qty 0.4

## 2015-06-06 MED ORDER — INSULIN GLARGINE 100 UNIT/ML ~~LOC~~ SOLN
8.0000 [IU] | Freq: Every day | SUBCUTANEOUS | Status: DC
  Administered 2015-06-06: 8 [IU] via SUBCUTANEOUS
  Filled 2015-06-06 (×2): qty 0.08

## 2015-06-06 MED ORDER — INSULIN ASPART 100 UNIT/ML ~~LOC~~ SOLN
0.0000 [IU] | Freq: Three times a day (TID) | SUBCUTANEOUS | Status: DC
  Administered 2015-06-06: 5 [IU] via SUBCUTANEOUS
  Administered 2015-06-06 – 2015-06-07 (×2): 2 [IU] via SUBCUTANEOUS
  Administered 2015-06-07: 5 [IU] via SUBCUTANEOUS
  Administered 2015-06-07 – 2015-06-08 (×2): 3 [IU] via SUBCUTANEOUS
  Administered 2015-06-08: 11 [IU] via SUBCUTANEOUS
  Administered 2015-06-08: 2 [IU] via SUBCUTANEOUS
  Administered 2015-06-09: 3 [IU] via SUBCUTANEOUS
  Administered 2015-06-09: 2 [IU] via SUBCUTANEOUS
  Administered 2015-06-09: 5 [IU] via SUBCUTANEOUS
  Administered 2015-06-10: 3 [IU] via SUBCUTANEOUS
  Administered 2015-06-10: 5 [IU] via SUBCUTANEOUS
  Administered 2015-06-10 – 2015-06-11 (×3): 2 [IU] via SUBCUTANEOUS
  Administered 2015-06-11: 5 [IU] via SUBCUTANEOUS
  Administered 2015-06-12 (×2): 3 [IU] via SUBCUTANEOUS
  Administered 2015-06-12: 5 [IU] via SUBCUTANEOUS

## 2015-06-06 MED ORDER — INSULIN ASPART 100 UNIT/ML ~~LOC~~ SOLN
0.0000 [IU] | Freq: Every day | SUBCUTANEOUS | Status: DC
  Administered 2015-06-07: 3 [IU] via SUBCUTANEOUS
  Administered 2015-06-09 – 2015-06-10 (×2): 2 [IU] via SUBCUTANEOUS

## 2015-06-06 NOTE — Progress Notes (Signed)
Patient ID: [name], female   DOB: [birth year], 49 y.o.   MRN: [id number]         [facility] for Infectious Disease    Date of Admission:  06/01/2015    Total days of antibiotics 6        Day 2 cefazolin         Principal Problem:   Staphylococcus aureus bacteremia with sepsis Active Problems:   Gangrene of foot   Septic shock   Diabetic ulcer of left foot associated with type 1 diabetes mellitus   Respiratory failure   . antiseptic oral rinse  7 mL Mouth Rinse QID  .  ceFAZolin (ANCEF) IV  2 g Intravenous 3 times per day  . chlorhexidine  15 mL Mouth Rinse BID  . clotrimazole  1 Applicatorful Vaginal QHS  . famotidine (PEPCID) IV  20 mg Intravenous Q24H  . feeding supplement (PRO-STAT 64)  30 mL Per Tube q morning - 10a  . insulin aspart  0-20 Units Subcutaneous 6 times per day  . metoCLOPramide (REGLAN) injection  5 mg Intravenous 3 times per day  . pneumococcal 23 valent vaccine  0.5 mL Intramuscular Tomorrow-1000  . potassium chloride  10 mEq Intravenous Q1 Hr x 4    Past Medical History  Diagnosis Date  . Diabetes mellitus   . 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 Janie Morning), Cysto performed by Dr. Jonna Munro 12/2009 with evac of clots and bx and fulguration. // H/O stage 2 SCC of the vulva  . Anemia     2/2 blood loss  . Diabetes mellitus type 2, uncontrolled DX: 2001  . 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 Diabetic foot ulcers   . Cataract   . Diabetes mellitus without complication     History  Substance Use Topics  . Smoking status: Never Smoker   . Smokeless tobacco: Never Used  . Alcohol Use: No    Family History  Problem Relation Age of Onset  . Diabetes Mother   . Diabetes Father   . Diabetes Brother     No Known Allergies  OBJECTIVE: Blood pressure 123/62, pulse 84, temperature 97.7 F (36.5 C), temperature source Oral, resp. rate 14, height 4\' 11"  (1.499 m), weight 115 lb 11.9 oz (52.5 kg), last menstrual period 05/12/2013, SpO2 100 %. General: She is alert and comfortable in bed Lungs: Clear Cor: Regular S1 and S2 with no murmurs Left BKA stump wrapped  Lab Results Lab Results  Component Value Date   WBC 10.6* 06/06/2015   HGB 8.4* 06/06/2015   HCT 25.2* 06/06/2015   MCV 87.8 06/06/2015   PLT 366 06/06/2015    Lab Results  Component Value Date   CREATININE 0.47 06/06/2015   BUN <5* 06/06/2015   NA 141 06/06/2015   K 3.1* 06/06/2015   CL 102 06/06/2015   CO2 31 06/06/2015    Lab Results  Component Value Date   ALT 18 06/03/2015   AST 16 06/03/2015   ALKPHOS 140* 06/03/2015   BILITOT 1.1 06/03/2015     Microbiology: Recent Results (from the past 240 hour(s))  Blood Culture (routine x 2)     Status: None (Preliminary result)   Collection Time: 06/01/15  9:29 PM  Result Value Ref Range Status   Specimen Description BLOOD RIGHT ANTECUBITAL  Final   Special Requests BOTTLES DRAWN AEROBIC AND ANAEROBIC 5CC  Final   Culture  Setup Time   Final    GRAM POSITIVE COCCI IN CLUSTERS CRITICAL RESULT CALLED TO, READ BACK BY AND VERIFIED WITH: Talbert Cage [phone] IN BOTH AEROBIC AND ANAEROBIC BOTTLES    Culture   Final    STAPHYLOCOCCUS AUREUS Performed at [facility]    Report Status PENDING  Incomplete   Organism ID, Bacteria STAPHYLOCOCCUS AUREUS  Final      Susceptibility   Staphylococcus aureus - MIC*    CIPROFLOXACIN <=0.5 SENSITIVE Sensitive     ERYTHROMYCIN >=8 RESISTANT Resistant     GENTAMICIN <=0.5 SENSITIVE Sensitive     OXACILLIN <=0.25 SENSITIVE Sensitive     TETRACYCLINE >=16 RESISTANT Resistant     VANCOMYCIN <=0.5 SENSITIVE Sensitive     TRIMETH/SULFA <=10 SENSITIVE Sensitive     CLINDAMYCIN <=0.25 RESISTANT Resistant      RIFAMPIN <=0.5 SENSITIVE Sensitive     Inducible Clindamycin POSITIVE Resistant     * STAPHYLOCOCCUS AUREUS  Blood Culture (routine x 2)     Status: None (Preliminary result)   Collection Time: 06/01/15  9:29 PM  Result Value Ref Range Status   Specimen Description BLOOD LEFT ANTECUBITAL  Final   Special Requests BOTTLES DRAWN AEROBIC AND ANAEROBIC 5CC  Final   Culture  Setup Time   Final    GRAM POSITIVE COCCI IN CLUSTERS IN BOTH AEROBIC AND ANAEROBIC BOTTLES CRITICAL RESULT CALLED TO, READ BACK BY AND VERIFIED WITH: S DENNY,RN [phone] [place] CONFIRMED BY K WILDER    Culture   Final    STAPHYLOCOCCUS AUREUS SUSCEPTIBILITIES PERFORMED ON PREVIOUS CULTURE WITHIN THE LAST 5 DAYS. Performed at [facility]    Report Status PENDING  Incomplete  Urine culture     Status: None   Collection Time: 06/01/15  9:44 PM  Result Value Ref Range Status   Specimen Description URINE, CATHETERIZED  Final   Special Requests NONE  Final   Culture   Final    >=100,000 COLONIES/mL ESCHERICHIA COLI Performed at [facility]    Report Status 06/04/2015 FINAL  Final   Organism ID, Bacteria ESCHERICHIA COLI  Final      Susceptibility   Escherichia coli - MIC*    AMPICILLIN >=32 RESISTANT Resistant     CEFAZOLIN <=4 SENSITIVE Sensitive     CEFTRIAXONE <=1 SENSITIVE Sensitive     CIPROFLOXACIN <=0.25 SENSITIVE Sensitive     GENTAMICIN <=1 SENSITIVE Sensitive     IMIPENEM <=0.25 SENSITIVE Sensitive     NITROFURANTOIN <=16 SENSITIVE Sensitive     TRIMETH/SULFA <=20 SENSITIVE Sensitive     AMPICILLIN/SULBACTAM 16 INTERMEDIATE Intermediate     PIP/TAZO <=4 SENSITIVE Sensitive     * >=100,000 COLONIES/mL ESCHERICHIA COLI  Gram stain     Status: None   Collection Time: 06/02/15  1:30 AM  Result Value Ref Range Status   Specimen Description ABSCESS L FOOT  Final   Special Requests NONE  Final   Gram Stain   Final    ABUNDANT GRAM POSITIVE COCCI IN PAIRS FEW WBC PRESENT,  PREDOMINANTLY PMN Gram Stain Report Called to,Read Back By and Verified With: Dollene Cleveland RN @ [phone] ON 06/02/15 BY C DAVIS    Report Status 06/02/2015 FINAL  Final  Culture, routine-abscess     Status: None   Collection Time:  06/02/15  1:30 AM  Result Value Ref Range Status   Specimen Description ABSCESS L FOOT A  Final   Special Requests NONE  Final   Gram Stain   Final    FEW WBC PRESENT, PREDOMINANTLY PMN ABUNDANT GRAM POSITIVE COCCI IN PAIRS Gram Stain Report Called to,Read Back By and Verified With: Gram Stain Report Called to,Read Back By and Verified With: Dollene Cleveland RN @ 1194 ON 06/02/15 BY C DAVIS Performed by [facility] Performed at [facility]    Culture   Final    MODERATE STAPHYLOCOCCUS AUREUS Note: RIFAMPIN AND GENTAMICIN SHOULD NOT BE USED AS SINGLE DRUGS FOR TREATMENT OF STAPH INFECTIONS. This organism is presumed to be Clindamycin resistant based on detection of inducible Clindamycin resistance. Performed at [facility]    Report Status 06/06/2015 FINAL  Final   Organism ID, Bacteria STAPHYLOCOCCUS AUREUS  Final      Susceptibility   Staphylococcus aureus - MIC*    CLINDAMYCIN RESISTANT      ERYTHROMYCIN >=8 RESISTANT Resistant     GENTAMICIN <=0.5 SENSITIVE Sensitive     LEVOFLOXACIN 0.25 SENSITIVE Sensitive     OXACILLIN 0.5 SENSITIVE Sensitive     PENICILLIN >=0.5 RESISTANT Resistant     RIFAMPIN <=0.5 SENSITIVE Sensitive     TRIMETH/SULFA <=10 SENSITIVE Sensitive     VANCOMYCIN 1 SENSITIVE Sensitive     TETRACYCLINE >=16 RESISTANT Resistant     MOXIFLOXACIN <=0.25 SENSITIVE Sensitive     * MODERATE STAPHYLOCOCCUS AUREUS  Culture, routine-abscess     Status: None   Collection Time: 06/02/15  1:30 AM  Result Value Ref Range Status   Specimen Description ABSCESS L FOOT B  Final   Special Requests NONE  Final   Gram Stain   Final    FEW WBC PRESENT,BOTH PMN AND MONONUCLEAR NO SQUAMOUS EPITHELIAL CELLS SEEN ABUNDANT GRAM NEGATIVE  RODS MODERATE GRAM POSITIVE COCCI IN PAIRS IN CLUSTERS    Culture   Final    MODERATE STAPHYLOCOCCUS AUREUS Note: RIFAMPIN AND GENTAMICIN SHOULD NOT BE USED AS SINGLE DRUGS FOR TREATMENT OF STAPH INFECTIONS. This organism is presumed to be Clindamycin resistant based on detection of inducible Clindamycin resistance. Performed at [facility]    Report Status 06/06/2015 FINAL  Final   Organism ID, Bacteria STAPHYLOCOCCUS AUREUS  Final      Susceptibility   Staphylococcus aureus - MIC*    CLINDAMYCIN RESISTANT      ERYTHROMYCIN >=8 RESISTANT Resistant     GENTAMICIN <=0.5 SENSITIVE Sensitive     LEVOFLOXACIN 0.25 SENSITIVE Sensitive     OXACILLIN 0.5 SENSITIVE Sensitive     PENICILLIN >=0.5 RESISTANT Resistant     RIFAMPIN <=0.5 SENSITIVE Sensitive     TRIMETH/SULFA <=10 SENSITIVE Sensitive     VANCOMYCIN <=0.5 SENSITIVE Sensitive     TETRACYCLINE >=16 RESISTANT Resistant     MOXIFLOXACIN <=0.25 SENSITIVE Sensitive     * MODERATE STAPHYLOCOCCUS AUREUS  Anaerobic culture     Status: None (Preliminary result)   Collection Time: 06/02/15  1:30 AM  Result Value Ref Range Status   Specimen Description ABSCESS L FOOT A  Final   Special Requests NONE  Final   Gram Stain   Final    MODERATE WBC PRESENT,BOTH PMN AND MONONUCLEAR NO SQUAMOUS EPITHELIAL CELLS SEEN ABUNDANT GRAM NEGATIVE RODS ABUNDANT GRAM POSITIVE COCCI IN PAIRS IN CLUSTERS    Culture   Final    NO ANAEROBES ISOLATED; CULTURE IN PROGRESS FOR  5 DAYS Performed at [facility]    Report Status PENDING  Incomplete  Anaerobic culture     Status: None (Preliminary result)   Collection Time: 06/02/15  1:30 AM  Result Value Ref Range Status   Specimen Description ABSCESS L FOOT B  Final   Special Requests NONE  Final   Gram Stain   Final    MODERATE WBC PRESENT,BOTH PMN AND MONONUCLEAR NO SQUAMOUS EPITHELIAL CELLS SEEN ABUNDANT GRAM NEGATIVE RODS ABUNDANT GRAM POSITIVE COCCI IN PAIRS IN CLUSTERS     Culture   Final    NO ANAEROBES ISOLATED; CULTURE IN PROGRESS FOR 5 DAYS Performed at [facility]    Report Status PENDING  Incomplete  MRSA PCR Screening     Status: None   Collection Time: 06/02/15  3:19 AM  Result Value Ref Range Status   MRSA by PCR NEGATIVE NEGATIVE Final    Comment:        The GeneXpert MRSA Assay (FDA approved for NASAL specimens only), is one component of a comprehensive MRSA colonization surveillance program. It is not intended to diagnose MRSA infection nor to guide or monitor treatment for MRSA infections.     Assessment: One specimen from her foot abscess showed both gram-positive cocci and gram-negative rods on stain staph aureus is the only thing that has grown from abscess cultures and blood cultures. She is doing well on cefazolin. There is no evidence of endocarditis on transthoracic echocardiogram. We may need to consider TEE. Given that she has had a BKA she probably only needs a relatively short course (2-3 weeks) of cefazolin; however, if she does have endocarditis she will need 4-6 weeks of therapy.  Plan: 1. Continue cefazolin 2. Consider TEE  Michel Bickers, MD [facility] for Infectious [place] [facility] [phone] pager   [phone] cell 06/06/2015, 11:24 AM

## 2015-06-06 NOTE — Progress Notes (Signed)
   Subjective:  Patient is alert.  Objective:   VITALS:   Filed Vitals:   06/06/15 0400 06/06/15 0500 06/06/15 0600 06/06/15 0700  BP: 130/64 129/70 132/99 106/57  Pulse: 84 91 73 81  Temp:      TempSrc:      Resp: 16 18 13 14   Height:      Weight:  52.5 kg (115 lb 11.9 oz)    SpO2: 100% 100% 94% 100%    VAC with good seal and suction No cellulitis or warmth   Lab Results  Component Value Date   WBC 10.6* 06/06/2015   HGB 8.4* 06/06/2015   HCT 25.2* 06/06/2015   MCV 87.8 06/06/2015   PLT 366 06/06/2015     Assessment/Plan:  1 Day Post-Op   - to OR Saturday for repeat I&D and likely closure - may be extubated from ortho stand point - continue VAC - MSSA and GNRs - continue culture directed abx therapy - ID following  Marianna Payment 06/06/2015, 7:47 AM [phone]

## 2015-06-06 NOTE — Progress Notes (Signed)
Nutrition Follow-up  INTERVENTION:   Diet advancement as able per SLP  If PO intake is poor, recommend adding Glucerna Shake supplement BID to maximize protein intake.  NUTRITION DIAGNOSIS:  Increased nutrient needs related to wound healing as evidenced by estimated needs.  Ongoing  GOAL:  Patient will meet greater than or equal to 90% of their needs  Unmet  MONITOR:  Diet advancement, PO intake, Labs, Weight trends, Skin  ASSESSMENT:  49 yo female with hx of DM presented to ER with Lt foot pain x one week associated with open wound. Found to have septic shock and gangrene of Lt lower extremity.  S/P left BKA 7/3, 7/4; S/P I&D 7/5, 7/6; for return to OR Saturday for further I&D and possible closure of the wound. Extubated this AM, transferring to the floor today. Awaiting SLP evaluation for diet advancement. Patient with increased nutrition needs to support healing.   Height:  Ht Readings from Last 1 Encounters:  06/02/15 4\' 11"  (1.499 m)    Weight:  Wt Readings from Last 1 Encounters:  06/06/15 115 lb 11.9 oz (52.5 kg)    Ideal Body Weight:  44.8 kg  Wt Readings from Last 10 Encounters:  06/06/15 115 lb 11.9 oz (52.5 kg)  03/21/15 133 lb (60.328 kg)  12/20/14 125 lb 6.4 oz (56.881 kg)  01/19/13 139 lb (63.05 kg)  10/20/12 138 lb 4.8 oz (62.732 kg)  10/05/12 136 lb (61.689 kg)  05/17/12 133 lb 4 oz (60.442 kg)  04/10/12 130 lb 4.8 oz (59.104 kg)  03/16/11 123 lb (55.792 kg)  02/19/11 122 lb 6.4 oz (55.52 kg)    BMI:  Body mass index is 23.36 kg/(m^2).  Estimated Nutritional Needs:  Kcal:  1700-1900  Protein:  85-100 gm  Fluid:  1.8 L  Skin:  Wound (see comment) (left leg BKA site)  Diet Order:  Diet Carb Modified Fluid consistency:: Thin; Room service appropriate?: Yes (pending SLP evaluation)  EDUCATION NEEDS:  No education needs identified at this time   Intake/Output Summary (Last 24 hours) at 06/06/15 1333 Last data filed at 06/06/15  1245  Gross per 24 hour  Intake   1750 ml  Output   5075 ml  Net  -3325 ml    Last BM:  7/7   Molli Barrows, RD, LDN, [place] Pager [phone] After Hours Pager [phone]

## 2015-06-06 NOTE — Progress Notes (Signed)
Interpreter Lesle Chris for Respiratory Team

## 2015-06-06 NOTE — Progress Notes (Signed)
Report called to [facility]  on [address].  Pt was informed via translator that she would be transferring to another floor. All belongings with pt.

## 2015-06-06 NOTE — Procedures (Signed)
Extubation Procedure Note  Patient Details:   Name: [name] DOB: [birth year] MRN: [id number]   Airway Documentation:     Evaluation  O2 sats: stable throughout Complications: No apparent complications Patient did tolerate procedure well. Bilateral Breath Sounds: Clear, Diminished Suctioning: Airway Yes  Patient tolerated wean. MD ordered to extubate. Positive for cuff leak. Patient extubated to a 3 Lpm nasal cannula. No signs of dyspnea or stridor. Patient instructed on the Incentive Spirometer achieving 750 mL, three times. RN at bedside. Patient resting comfortably.   Myrtie Neither 06/06/2015, 9:01 AM

## 2015-06-06 NOTE — Progress Notes (Signed)
PULMONARY / CRITICAL CARE MEDICINE   Name: [name] MRN: [id number] DOB: January 21, 1966    ADMISSION DATE:  06/01/2015  REFERRING MD :  ER  CHIEF COMPLAINT:  Left Foot Pain  BRIEF DESCRIPTION:  49 yo female with hx of DM presented to ER with Lt foot pain x one week associated with open wound.  Found to have septic shock and gangrene of Lt lower extremity.  STUDIES:  7/02 Xray Lt foot >> extensive soft tissue gas Urine 7/02 >> E. Coli Blood 7/02 >> GPC in clusters Wound 7/03 >> GPC in pairs, GN rods, Moderate Staph Aureus  SIGNIFICANT EVENTS: 7/02 Admit, ortho consulted 7/03 Lt [place] amputation 7/04 Lt leg amputation through tibia, fibula 7/05 Return to OR for further debridement  SUBJECTIVE:  No acute complaints overnight. Denies pain. Tolerated extubation well.  VITAL SIGNS: Temp:  [97.5 F (36.4 C)-97.9 F (36.6 C)] 97.9 F (36.6 C) (07/07 1126) Pulse Rate:  [73-94] 80 (07/07 1300) Resp:  [0-25] 18 (07/07 1300) BP: (106-162)/(57-99) 125/73 mmHg (07/07 1300) SpO2:  [90 %-100 %] 100 % (07/07 1300) FiO2 (%):  [30 %] 30 % (07/07 0810) Weight:  [115 lb 11.9 oz (52.5 kg)] 115 lb 11.9 oz (52.5 kg) (07/07 0500) HEMODYNAMICS:   INTAKE / OUTPUT:  Intake/Output Summary (Last 24 hours) at 06/06/15 1351 Last data filed at 06/06/15 1245  Gross per 24 hour  Intake   1750 ml  Output   5075 ml  Net  -3325 ml    PHYSICAL EXAMINATION: General: 49yo female, alert, in no apparent distress Neuro:  Alert, moves extremities spontaneously HEENT:  PERRLA, MMM Cardiovascular:  S1 and S2 noted, no murmur, regular rate and rhythm Lungs: CTAB, no wheezes, no increased work of breathing Abdomen:  Soft, non-distended, non tender Skin:  wound dressing clean on left leg, wound vac in place  LABS:  CBC  Recent Labs Lab 06/03/15 0100 06/03/15 0805 06/04/15 0357 06/06/15 0509  WBC 12.4*  --  12.1* 10.6*  HGB 9.8* 10.2* 8.4* 8.4*  HCT 27.8* 30.0* 24.1* 25.2*  PLT 428*   --  366 366   Coag's  Recent Labs Lab 06/02/15 0415  APTT 31  INR 1.43   BMET  Recent Labs Lab 06/04/15 0357 06/05/15 0440 06/06/15 0509  NA 151* 145 141  K 3.4* 3.3* 3.1*  CL 114* 109 102  CO2 31 31 31   BUN 9 9 <5*  CREATININE 0.67 0.59 0.47  GLUCOSE 178* 141* 116*   Electrolytes   Recent Labs Lab 06/02/15 0415 06/03/15 0100 06/03/15 1150 06/04/15 0357 06/05/15 0440 06/06/15 0509  CALCIUM 6.9* 7.1* 6.9* 7.2* 7.4* 7.5*  MG 1.9  1.9 1.7 2.0 1.9  --   --   PHOS 3.2 2.7  --   --   --   --    Sepsis Markers  Recent Labs Lab 06/02/15 0415  06/02/15 1430 06/02/15 1820 06/03/15 0100  LATICACIDVEN 1.5  < > 1.3 1.2 1.3  PROCALCITON 14.82  --   --   --   --   < > = values in this interval not displayed. ABG  Recent Labs Lab 06/03/15 0423 06/03/15 0805 06/04/15 0350  PHART 7.401 7.486* 7.435  PCO2ART 41.7 36.1 46.8*  PO2ART 84.3 220.0* 116.0*   Liver Enzymes  Recent Labs Lab 06/01/15 2129 06/02/15 0415 06/03/15 0100  AST 28 29 16   ALT 22 22 18   ALKPHOS 193* 165* 140*  BILITOT 1.6* 1.2 1.1  ALBUMIN 2.3* 1.8* 1.9*  Cardiac Enzymes  Recent Labs Lab 06/02/15 0415 06/02/15 0923 06/02/15 1520  TROPONINI <0.03 0.03 0.06*   Glucose  Recent Labs Lab 06/05/15 1754 06/05/15 2027 06/06/15 0016 06/06/15 0319 06/06/15 0838 06/06/15 1124  GLUCAP 184* 221* 166* 165* 132* 131*    Imaging No results found. ASSESSMENT / PLAN:  PULMONARY ETT 7/03 >> A: Acute respiratory failure 2nd to septic shock and acidosis--Resolved  P:   Extubation today Monitor respiratory status following extubation  CARDIOVASCULAR Lt [place] CVL 7/03 >> A:  Septic shock.  P:  Currently off of pressors Monitor BP  RENAL A:   AKI 2nd to septic shock. Lactic acidosis--resolved. Hypokalemia--Improving (3.1)  P:   Volume resuscitate Monitor renal fx, urine outpt Replete potassium  GASTROINTESTINAL A:   Nutrition. Nausea/Vomiting  P:   Swallow  evaluation. Regular diet if pass.  HEMATOLOGIC A:   Anemia of critical illness and chronic disease.  P:  F/u CBC Lovenox  INFECTIOUS A:   Septic shock 2nd to Lt lower extremity gangrene s/p amputation. Candidal vulvovaginitis.  P:   Post-op/wound care per ortho, wound care team Contact isolation due to concern for necrotizing fasciitis Diflucan x one on 7/04, lotrimin cream To OR again 7/6 for further debridement  Clindamycin 7/3>>7/5 Zosyn 7/2>>7/5 Vancomycin 7/2>>7/6 *CTX 7/5>> Clotrimazole vaginal cream, Completed Diflucan x1 7/4  Returning to OR on Saturday for repeat I&D and likely closure.  ID Consulted  ENDOCRINE A:   DM type II.  P:   Moderate Insulin Sliding Scale Restart Lantus 8units nightly  NEUROLOGIC A:   Acute encephalopathy 2nd to septic shock.  P:   Continue to monitor  Today's Summary: Transfer out to floor. Restart Lantus and switch to moderate insulin sliding scale. Continue antibiotics. Return to OR on Saturday 7/9 for further debridement and closure. Oxycodone and Tylenol PRN pain.  Dr. Junie Panning, DO Family Medicine, PGY-2  06/06/2015, 1:51 PM  PCCM ATTENDING: I have reviewed pt's initial presentation, consultants notes and hospital database in detail.  The above assessment and plan was formulated under my direction.    Merton Border, MD;  PCCM service; Mobile [phone]

## 2015-06-06 NOTE — Evaluation (Signed)
Clinical/Bedside Swallow Evaluation Patient Details  Name: [name] MRN: [id number] Date of Birth: 05/17/1966  Today's Date: 06/06/2015 Time: SLP Start Time (ACUTE ONLY): 1340 SLP Stop Time (ACUTE ONLY): 1410 SLP Time Calculation (min) (ACUTE ONLY): 30 min  Past Medical History:  Past Medical History  Diagnosis Date  . Diabetes mellitus   . 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 Laurette Schimke), Cysto performed by Dr. Katherine Roan 12/2009 with evac of clots and bx and fulguration. // H/O stage 2 SCC of the vulva  . Anemia     2/2 blood loss  . Diabetes mellitus type 2, uncontrolled DX: 2001  . 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 Diabetic foot ulcers   . Cataract   . Diabetes mellitus without complication    Past Surgical History:  Past Surgical History  Procedure Laterality Date  . Radical wide local excision of the vulva and right nguinal lymph node dissection  10/2003    Dr. Stanford Breed  . Uterine dilatation and currettage    . Labial mass excision  07/2003    Dr. Elana Alm  . Left second toe mtp joint amputation  12/19/2010    Dr. Turner Daniels  . Irrigation and debridement of left foot with removal of left  01/29/2011    Dr. Turner Daniels  . Tubal ligation    . I&d extremity Left 06/02/2015    Procedure: IRRIGATION AND DEBRIDEMENT EXTREMITY;  Surgeon: Tarry Kos, MD;  Location: [facility];  Service: Orthopedics;  Laterality: Left;  . Amputation Left 06/02/2015    Procedure: Partial Left  AMPUTATION FOOT;  Surgeon: Tarry Kos, MD;  Location: [facility];  Service: Orthopedics;  Laterality: Left;  . Amputation Left 06/03/2015    Procedure: Left Transtibial amputation,application of wound vac;  Surgeon: Tarry Kos, MD;  Location: [facility];  Service: Orthopedics;   Laterality: Left;  . I&d extremity Left 06/05/2015    Procedure: IRRIGATION AND DEBRIDEMENT LEFT LEG, POSSIBLE CLOSURE BELOW KNEE AMPUTATION;  Surgeon: Tarry Kos, MD;  Location: [facility] OR;  Service: Orthopedics;  Laterality: Left;   HPI:  [name] is a 65 F with poorly controlled DM2, chart history of foot osteomyelitis, and Townsend footvaginal cancer and cervical cancer s/p excision and definitive radiation therapy, respectively who presented [facility] on 7[facility] with foot pain and was found to be hypotensive. She was subsequently found to have gas gangrene of the foot and was taken emergently to surgery. She  was intubated and sedated due to decompensation in OR.  Pt decannulated on 06/06/2015   Assessment / Plan / Recommendation Clinical Impression   Pt presents with s/s of a mild pharyngeal dysphagia characterized by decreased hyolaryngeal elevation per palpation and suspected delayed swallow initiation.  Vocal intensity was mildly decreased, vocal quality remained clear prior to and during PO trials,  and pt exhibited a strong volitional and reflexive cough.  Pt was noted to immediately cough consistently following large cup sips of thin liquids and inconsistently following small sips of thins.  No overt s/s of aspiration were evident with nectar thick liquids regardless of bolus size.  Pt was also able to effectively masticate and clear purees and solids from the oral cavity without difficulty.  Suspect s/s of aspiration are related  to recent extubation and SLP is hopeful swallowing function will improve quickly with SLP interventions for diet toleration and trials of advanced consistencies.  Recommend that pt be initiated on a dys 3 diet with nectar thick liquids.      Aspiration Risk  Mild    Diet Recommendation Dysphagia 3 (Mech soft);Nectar   Medication Administration: Whole meds with puree Compensations: Slow rate;Small sips/bites    Other  Recommendations Oral Care Recommendations: Oral care  BID Other Recommendations: Order thickener from pharmacy   Follow Up Recommendations       Frequency and Duration min 2x/week  2 weeks       Swallow Study    General Date of Onset: 06/02/15 Other Pertinent Information: [name] is a 62 F with poorly controlled DM2, chart history of foot osteomyelitis, and Townsend footvaginal cancer and cervical cancer s/p excision and definitive radiation therapy, respectively who presented to [facility] on 7/2 with foot pain and was found to be hypotensive. She was subsequently found to have gas gangrene of the foot and was taken emergently to surgery. She  was intubated and sedated.  Pt decannulated on 06/06/2015 Type of Study: Bedside swallow evaluation Previous Swallow Assessment: n/a Diet Prior to this Study: NPO Temperature Spikes Noted: No Respiratory Status: Room air History of Recent Intubation: Yes Length of Intubations (days): 4 days Date extubated: 06/06/15 Behavior/Cognition: Alert;Cooperative;Pleasant mood Oral Cavity - Dentition: Adequate natural dentition/normal for age Self-Feeding Abilities: Able to feed self Patient Positioning: Upright in bed Baseline Vocal Quality: Low vocal intensity Volitional Cough: Strong    Oral/Motor/Sensory Function Overall Oral Motor/Sensory Function: Appears within functional limits for tasks assessed      Thin Liquid Thin Liquid: Impaired Presentation: Cup;Self Fed Pharyngeal  Phase Impairments: Suspected delayed Swallow;Cough - Immediate    Nectar Thick Nectar Thick Liquid: Within functional limits      Puree Puree: Within functional limits Presentation: Self Fed;Spoon   Solid   GO    Solid: Within functional limits Presentation: Self Fed       [name], [name] 06/06/2015,2:20 PM

## 2015-06-07 ENCOUNTER — Encounter (HOSPITAL_COMMUNITY): Payer: Self-pay | Admitting: Orthopaedic Surgery

## 2015-06-07 DIAGNOSIS — B954 Other streptococcus as the cause of diseases classified elsewhere: Secondary | ICD-10-CM

## 2015-06-07 LAB — ANAEROBIC CULTURE

## 2015-06-07 LAB — BASIC METABOLIC PANEL
ANION GAP: 6 (ref 5–15)
BUN: 6 mg/dL (ref 6–20)
CO2: 29 mmol/L (ref 22–32)
CREATININE: 0.61 mg/dL (ref 0.44–1.00)
Calcium: 7.4 mg/dL — ABNORMAL LOW (ref 8.9–10.3)
Chloride: 101 mmol/L (ref 101–111)
GFR calc Af Amer: >60 mL/min (ref >60)
GFR calc non Af Amer: >60 mL/min (ref >60)
GLUCOSE: 274 mg/dL — AB (ref 65–99)
Potassium: 4 mmol/L (ref 3.5–5.1)
Sodium: 136 mmol/L (ref 135–145)

## 2015-06-07 LAB — GLUCOSE, CAPILLARY
GLUCOSE-CAPILLARY: 283 mg/dL — AB (ref 65–99)
Glucose-Capillary: 245 mg/dL — ABNORMAL HIGH (ref 65–99)
Glucose-Capillary: 127 mg/dL — ABNORMAL HIGH (ref 65–99)
Glucose-Capillary: 174 mg/dL — ABNORMAL HIGH (ref 65–99)

## 2015-06-07 MED ORDER — INSULIN GLARGINE 100 UNIT/ML ~~LOC~~ SOLN
14.0000 [IU] | Freq: Every day | SUBCUTANEOUS | Status: DC
  Administered 2015-06-07 – 2015-06-09 (×2): 14 [IU] via SUBCUTANEOUS
  Filled 2015-06-07 (×3): qty 0.14

## 2015-06-07 MED ORDER — PRO-STAT SUGAR FREE PO LIQD
30.0000 mL | Freq: Two times a day (BID) | ORAL | Status: DC
  Administered 2015-06-07 – 2015-06-12 (×7): 30 mL via ORAL
  Filled 2015-06-07 (×8): qty 30

## 2015-06-07 MED ORDER — ADULT MULTIVITAMIN W/MINERALS CH
1.0000 | ORAL_TABLET | Freq: Every day | ORAL | Status: DC
  Administered 2015-06-07 – 2015-06-12 (×6): 1 via ORAL
  Filled 2015-06-07 (×6): qty 1

## 2015-06-07 MED ORDER — OXYCODONE HCL 5 MG PO TABS
5.0000 mg | ORAL_TABLET | ORAL | Status: DC | PRN
  Administered 2015-06-07 – 2015-06-10 (×7): 10 mg via ORAL
  Filled 2015-06-07 (×8): qty 2

## 2015-06-07 MED ORDER — RESOURCE THICKENUP CLEAR PO POWD
ORAL | Status: DC | PRN
  Filled 2015-06-07: qty 125

## 2015-06-07 NOTE — Evaluation (Signed)
Occupational Therapy Evaluation Patient Details Name: [name] MRN: [id number] DOB: Mar 03, 1966 Today's Date: 06/07/2015    History of Present Illness 49 y.o. 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   Clinical Impression   Pt with decline in function and safety with ADLs and ADL mobility with decreased strength, balance and endurance. Pt would benefit from acute OT services to address impairments and to increase level of function and safety    Follow Up Recommendations  CIR    Equipment Recommendations   TBD at next venue of care   Recommendations for Other Services Rehab consult     Precautions / Restrictions Precautions Precautions: Fall Restrictions Weight Bearing Restrictions: Yes LLE Weight Bearing: Non weight bearing      Mobility Bed Mobility               General bed mobility comments: pt up in recliner  Transfers Overall transfer level: Needs assistance Equipment used: Rolling walker (2 wheeled) Transfers: Sit to/from Stand Sit to Stand: Min assist              Balance Overall balance assessment: Needs assistance Sitting-balance support: No upper extremity supported Sitting balance-Leahy Scale: Fair     Standing balance support: Bilateral upper extremity supported Standing balance-Leahy Scale: Poor                              ADL Overall ADL's : Needs assistance/impaired     Grooming: Wash/dry hands;Wash/dry face;Sitting;Set up;Supervision/safety   Upper Body Bathing: Supervision/ safety;Set up;Sitting   Lower Body Bathing: Maximal assistance   Upper Body Dressing : Supervision/safety;Set up;Sitting   Lower Body Dressing: Total assistance     Toilet Transfer Details (indicate cue type and reason): unable due to pain, per PT pt is mod A with SPT          Functional mobility during ADLs: Minimal assistance;Moderate assistance (sit - stand min A to RW from recliner)       Vision  no change from baseline   Perception Perception Perception Tested?: No   Praxis Praxis Praxis tested?: Not tested    Pertinent Vitals/Pain Pain Assessment: Faces Pain Score: 5  Faces Pain Scale: Hurts little more Pain Descriptors / Indicators: Grimacing;Guarding Pain Intervention(s): Limited activity within patient's tolerance;Monitored during session;Repositioned     Hand Dominance Left   Extremity/Trunk Assessment Upper Extremity Assessment Upper Extremity Assessment: Generalized weakness   Lower Extremity Assessment Lower Extremity Assessment: Defer to PT evaluation       Communication Communication Communication: Prefers language other than [place];Other (comment) (some English)   Cognition Arousal/Alertness: Awake/alert Behavior During Therapy: WFL for tasks assessed/performed Overall Cognitive Status: Difficult to assess                     General Comments   pt pleasant and cooperative                 Home Living Family/patient expects to be discharged to:: Private residence Living Arrangements: Spouse/significant other Available Help at Discharge: Family;Available 24 hours/day Type of Home: House Home Access: Stairs to enter [employer] of Steps: 5 Entrance Stairs-Rails: Left Home Layout: One level     Bathroom Shower/Tub: Teacher, early years/pre: Handicapped height     Home Equipment: Bedside commode  Prior Functioning/Environment Level of Independence: Independent             OT Diagnosis: Generalized weakness;Acute pain   OT Problem List: Decreased strength;Decreased knowledge of use of DME or AE;Decreased activity tolerance;Impaired balance (sitting and/or standing);Pain   OT Treatment/Interventions: Self-care/ADL training;Therapeutic exercise;Therapeutic  activities;DME and/or AE instruction    OT Goals(Current goals can be found in the care plan section) Acute Rehab OT Goals Patient Stated Goal: rehab, the  home OT Goal Formulation: With patient Time For Goal Achievement: 06/14/15 Potential to Achieve Goals: Good ADL Goals Pt Will Perform Grooming: with set-up;sitting (unsupported) Pt Will Perform Upper Body Bathing: with set-up;sitting (unsupported) Pt Will Perform Lower Body Bathing: sitting/lateral leans;with mod assist Pt Will Perform Upper Body Dressing: sitting;with set-up (unsupported) Pt Will Perform Lower Body Dressing: with max assist;with mod assist;sitting/lateral leans Pt Will Transfer to Toilet: with min assist;bedside commode;stand pivot transfer  OT Frequency: Min 2X/week   Barriers to D/C: Decreased caregiver support                        End of Session Equipment Utilized During Treatment: Gait belt;Rolling walker  Activity Tolerance: Patient limited by pain Patient left: in chair;with call bell/phone within reach   Time: 1334-1401 OT Time Calculation (min): 27 min Charges:  OT General Charges $OT Visit: 1 Procedure OT Evaluation $Initial OT Evaluation Tier I: 1 Procedure OT Treatments $Therapeutic Activity: 8-22 mins G-Codes:    Britt Bottom 06/07/2015, 2:13 PM

## 2015-06-07 NOTE — Progress Notes (Signed)
Patient ID: [name], female   DOB: 03-26-1966, 49 y.o.   MRN: [id number]         [facility] for Infectious Disease    Date of Admission:  06/01/2015    Total days of antibiotics 7        Day 3 cefazolin         Principal Problem:   Staphylococcus aureus bacteremia with sepsis Active Problems:   Gangrene of foot   Septic shock   Diabetic ulcer of left foot associated with type 1 diabetes mellitus   Respiratory failure   Severe sepsis   . antiseptic oral rinse  7 mL Mouth Rinse BID  .  ceFAZolin (ANCEF) IV  2 g Intravenous 3 times per day  . clotrimazole  1 Applicatorful Vaginal QHS  . enoxaparin (LOVENOX) injection  40 mg Subcutaneous Q24H  . insulin aspart  0-15 Units Subcutaneous TID WC  . insulin aspart  0-5 Units Subcutaneous QHS  . insulin glargine  8 Units Subcutaneous QHS    Past Medical History  Diagnosis Date  . Diabetes mellitus   . 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 Janie Morning), Cysto performed by Dr. Jonna Munro 12/2009 with evac of clots and bx and fulguration. // H/O stage 2 SCC of the vulva  . Anemia     2/2 blood loss  . Diabetes mellitus type 2, uncontrolled DX: 2001  . 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 Diabetic foot ulcers   . Cataract   . Diabetes mellitus without complication     History  Substance Use Topics  . Smoking status: Never Smoker   . Smokeless tobacco: Never Used  . Alcohol Use: No    Family History  Problem Relation Age of Onset  . Diabetes Mother   . Diabetes Father   . Diabetes Brother    No Known Allergies  OBJECTIVE: Blood pressure 103/66, pulse 81, temperature 98.6 F (37 C), temperature source Oral, resp. rate 17, height 4\' 11"  (1.499 m), weight 115 lb 11.9 oz (52.5 kg),  last menstrual period 05/12/2013, SpO2 99 %. General: She is alert  Lungs: Clear Cor: Regular S1 and S2 with no murmurs Left BKA stump wrapped; a VAC dressing in place  Lab Results Lab Results  Component Value Date   WBC 10.6* 06/06/2015   HGB 8.4* 06/06/2015   HCT 25.2* 06/06/2015   MCV 87.8 06/06/2015   PLT 366 06/06/2015    Lab Results  Component Value Date   CREATININE 0.61 06/07/2015   BUN 6 06/07/2015   NA 136 06/07/2015   K 4.0 06/07/2015   CL 101 06/07/2015   CO2 29 06/07/2015    Lab Results  Component Value Date   ALT 18 06/03/2015   AST 16 06/03/2015   ALKPHOS 140* 06/03/2015   BILITOT 1.1 06/03/2015     Microbiology: Recent Results (from the past 240 hour(s))  Blood Culture (routine x 2)     Status: None (Preliminary result)   Collection Time: 06/01/15  9:29 PM  Result Value Ref Range Status   Specimen Description BLOOD RIGHT ANTECUBITAL  Final   Special Requests BOTTLES DRAWN AEROBIC AND ANAEROBIC 5CC  Final   Culture  Setup Time   Final  GRAM POSITIVE COCCI IN CLUSTERS CRITICAL RESULT CALLED TO, READ BACK BY AND VERIFIED WITH: Talbert Cage [id number] WIL[address]K IN BOTH AEROBIC AND ANAEROBIC BOTTLES    Culture   Final    STAPHYLOCOCCUS AUREUS IN BOTH AEROBIC AND ANAEROBIC BOTTLES VIRIDANS STREPTOCOCCUS AEROBIC BOTTLE ONLY Performed at [facility]    Report Status PENDING  Incomplete   Organism ID, Bacteria STAPHYLOCOCCUS AUREUS  Final      Susceptibility   Staphylococcus aureus - MIC*    CIPROFLOXACIN <=0.5 SENSITIVE Sensitive     ERYTHROMYCIN >=8 RESISTANT Resistant     GENTAMICIN <=0.5 SENSITIVE Sensitive     OXACILLIN <=0.25 SENSITIVE Sensitive     TETRACYCLINE >=16 RESISTANT Resistant     VANCOMYCIN <=0.5 SENSITIVE Sensitive     TRIMETH/SULFA <=10 SENSITIVE Sensitive     CLINDAMYCIN <=0.25 RESISTANT Resistant     RIFAMPIN <=0.5 SENSITIVE Sensitive     Inducible Clindamycin POSITIVE Resistant     * STAPHYLOCOCCUS AUREUS IN BOTH  AEROBIC AND ANAEROBIC BOTTLES  Blood Culture (routine x 2)     Status: None (Preliminary result)   Collection Time: 06/01/15  9:29 PM  Result Value Ref Range Status   Specimen Description BLOOD LEFT ANTECUBITAL  Final   Special Requests BOTTLES DRAWN AEROBIC AND ANAEROBIC 5CC  Final   Culture  Setup Time   Final    GRAM POSITIVE COCCI IN CLUSTERS IN BOTH AEROBIC AND ANAEROBIC BOTTLES CRITICAL RESULT CALLED TO, READ BACK BY AND VERIFIED WITH: S DENNY,RN [id number] CONFIRMED BY K WILDER    Culture   Final    STAPHYLOCOCCUS AUREUS SUSCEPTIBILITIES PERFORMED ON PREVIOUS CULTURE WITHIN THE LAST 5 DAYS. Performed at [facility]    Report Status PENDING  Incomplete  Urine culture     Status: None   Collection Time: 06/01/15  9:44 PM  Result Value Ref Range Status   Specimen Description URINE, CATHETERIZED  Final   Special Requests NONE  Final   Culture   Final    >=100,000 COLONIES/mL ESCHERICHIA COLI Performed at [facility]    Report Status 06/04/2015 FINAL  Final   Organism ID, Bacteria ESCHERICHIA COLI  Final      Susceptibility   Escherichia coli - MIC*    AMPICILLIN >=32 RESISTANT Resistant     CEFAZOLIN <=4 SENSITIVE Sensitive     CEFTRIAXONE <=1 SENSITIVE Sensitive     CIPROFLOXACIN <=0.25 SENSITIVE Sensitive     GENTAMICIN <=1 SENSITIVE Sensitive     IMIPENEM <=0.25 SENSITIVE Sensitive     NITROFURANTOIN <=16 SENSITIVE Sensitive     TRIMETH/SULFA <=20 SENSITIVE Sensitive     AMPICILLIN/SULBACTAM 16 INTERMEDIATE Intermediate     PIP/TAZO <=4 SENSITIVE Sensitive     * >=100,000 COLONIES/mL ESCHERICHIA COLI  Gram stain     Status: None   Collection Time: 06/02/15  1:30 AM  Result Value Ref Range Status   Specimen Description ABSCESS L FOOT  Final   Special Requests NONE  Final   Gram Stain   Final    ABUNDANT GRAM POSITIVE COCCI IN PAIRS FEW WBC PRESENT, PREDOMINANTLY PMN Gram Stain Report Called to,Read Back By and Verified With: Dollene Cleveland RN @  [phone] ON 06/02/15 BY C DAVIS    Report Status 06/02/2015 FINAL  Final  Culture, routine-abscess     Status: None   Collection Time: 06/02/15  1:30 AM  Result Value Ref Range Status   Specimen Description ABSCESS L FOOT A  Final  Special Requests NONE  Final   Gram Stain   Final    FEW WBC PRESENT, PREDOMINANTLY PMN ABUNDANT GRAM POSITIVE COCCI IN PAIRS Gram Stain Report Called to,Read Back By and Verified With: Gram Stain Report Called to,Read Back By and Verified With: Dollene Cleveland RN @ [id number] ON 06/02/15 BY C DAVIS Performed by [facility] Performed at [facility]    Culture   Final    MODERATE STAPHYLOCOCCUS AUREUS Note: RIFAMPIN AND GENTAMICIN SHOULD NOT BE USED AS SINGLE DRUGS FOR TREATMENT OF STAPH INFECTIONS. This organism is presumed to be Clindamycin resistant based on detection of inducible Clindamycin resistance. Performed at [facility]    Report Status 06/06/2015 FINAL  Final   Organism ID, Bacteria STAPHYLOCOCCUS AUREUS  Final      Susceptibility   Staphylococcus aureus - MIC*    CLINDAMYCIN RESISTANT      ERYTHROMYCIN >=8 RESISTANT Resistant     GENTAMICIN <=0.5 SENSITIVE Sensitive     LEVOFLOXACIN 0.25 SENSITIVE Sensitive     OXACILLIN 0.5 SENSITIVE Sensitive     PENICILLIN >=0.5 RESISTANT Resistant     RIFAMPIN <=0.5 SENSITIVE Sensitive     TRIMETH/SULFA <=10 SENSITIVE Sensitive     VANCOMYCIN 1 SENSITIVE Sensitive     TETRACYCLINE >=16 RESISTANT Resistant     MOXIFLOXACIN <=0.25 SENSITIVE Sensitive     * MODERATE STAPHYLOCOCCUS AUREUS  Culture, routine-abscess     Status: None   Collection Time: 06/02/15  1:30 AM  Result Value Ref Range Status   Specimen Description ABSCESS L FOOT B  Final   Special Requests NONE  Final   Gram Stain   Final    FEW WBC PRESENT,BOTH PMN AND MONONUCLEAR NO SQUAMOUS EPITHELIAL CELLS SEEN ABUNDANT GRAM NEGATIVE RODS MODERATE GRAM POSITIVE COCCI IN PAIRS IN CLUSTERS    Culture   Final    MODERATE  STAPHYLOCOCCUS AUREUS Note: RIFAMPIN AND GENTAMICIN SHOULD NOT BE USED AS SINGLE DRUGS FOR TREATMENT OF STAPH INFECTIONS. This organism is presumed to be Clindamycin resistant based on detection of inducible Clindamycin resistance. Performed at [facility]    Report Status 06/06/2015 FINAL  Final   Organism ID, Bacteria STAPHYLOCOCCUS AUREUS  Final      Susceptibility   Staphylococcus aureus - MIC*    CLINDAMYCIN RESISTANT      ERYTHROMYCIN >=8 RESISTANT Resistant     GENTAMICIN <=0.5 SENSITIVE Sensitive     LEVOFLOXACIN 0.25 SENSITIVE Sensitive     OXACILLIN 0.5 SENSITIVE Sensitive     PENICILLIN >=0.5 RESISTANT Resistant     RIFAMPIN <=0.5 SENSITIVE Sensitive     TRIMETH/SULFA <=10 SENSITIVE Sensitive     VANCOMYCIN <=0.5 SENSITIVE Sensitive     TETRACYCLINE >=16 RESISTANT Resistant     MOXIFLOXACIN <=0.25 SENSITIVE Sensitive     * MODERATE STAPHYLOCOCCUS AUREUS  Anaerobic culture     Status: None (Preliminary result)   Collection Time: 06/02/15  1:30 AM  Result Value Ref Range Status   Specimen Description ABSCESS L FOOT A  Final   Special Requests NONE  Final   Gram Stain   Final    MODERATE WBC PRESENT,BOTH PMN AND MONONUCLEAR NO SQUAMOUS EPITHELIAL CELLS SEEN ABUNDANT GRAM NEGATIVE RODS ABUNDANT GRAM POSITIVE COCCI IN PAIRS IN CLUSTERS    Culture   Final    NO ANAEROBES ISOLATED; CULTURE IN PROGRESS FOR 5 DAYS Performed at [facility]    Report Status PENDING  Incomplete  Anaerobic culture  Status: None (Preliminary result)   Collection Time: 06/02/15  1:30 AM  Result Value Ref Range Status   Specimen Description ABSCESS L FOOT B  Final   Special Requests NONE  Final   Gram Stain   Final    MODERATE WBC PRESENT,BOTH PMN AND MONONUCLEAR NO SQUAMOUS EPITHELIAL CELLS SEEN ABUNDANT GRAM NEGATIVE RODS ABUNDANT GRAM POSITIVE COCCI IN PAIRS IN CLUSTERS    Culture   Final    NO ANAEROBES ISOLATED; CULTURE IN PROGRESS FOR 5 DAYS Performed at  [facility]    Report Status PENDING  Incomplete  MRSA PCR Screening     Status: None   Collection Time: 06/02/15  3:19 AM  Result Value Ref Range Status   MRSA by PCR NEGATIVE NEGATIVE Final    Comment:        The GeneXpert MRSA Assay (FDA approved for NASAL specimens only), is one component of a comprehensive MRSA colonization surveillance program. It is not intended to diagnose MRSA infection nor to guide or monitor treatment for MRSA infections.   Culture, blood (routine x 2)     Status: None (Preliminary result)   Collection Time: 06/05/15  3:05 PM  Result Value Ref Range Status   Specimen Description BLOOD RIGHT HAND  Final   Special Requests BOTTLES DRAWN AEROBIC ONLY 10CC  Final   Culture NO GROWTH < 24 HOURS  Final   Report Status PENDING  Incomplete  Culture, blood (routine x 2)     Status: None (Preliminary result)   Collection Time: 06/05/15  6:09 PM  Result Value Ref Range Status   Specimen Description BLOOD RIGHT HAND  Final   Special Requests BOTTLES DRAWN AEROBIC AND ANAEROBIC 10CC  Final   Culture NO GROWTH < 24 HOURS  Final   Report Status PENDING  Incomplete    Assessment: She is improving on therapy for MSSA bacteremia after left BKA for gangrenous infection. She has no evidence of endocarditis based on clinical or transthoracic echo findings. Suggest evaluation for TEE to look for evidence of endocarditis since that will have an impact on duration of antibiotics therapy. Follow-up blood cultures are negative at 48 hours.  Plan: 1. Continue cefazolin 2. Recommend TEE 3. Please call Dr. Carlyle Basques [phone]) for any infectious disease questions this weekend  Michel Bickers, MD [facility] [phone] pager   [phone] cell 06/07/2015, 10:31 AM

## 2015-06-07 NOTE — Progress Notes (Signed)
Rehab Admissions Coordinator Note:  Patient was screened by Delcia Spitzley L for appropriateness for an Inpatient Acute Rehab Consult.  At this time, we are recommending Inpatient Rehab consult.   Dava Rensch L 06/07/2015, 10:58 AM  I can be reached at [phone].

## 2015-06-07 NOTE — Progress Notes (Signed)
Speech Language Pathology Treatment: Dysphagia  Patient Details Name: [name] MRN: [id number] DOB: February 01, 1966 Today's Date: 06/07/2015 Time: 7048-8891 SLP Time Calculation (min) (ACUTE ONLY): 15 min  Assessment / Plan / Recommendation Clinical Impression   Pt was seen for skilled ST targeting dysphagia goals.  Upon arrival, pt was seated upright in recliner,awake, alert, and agreeable to participate in [place].  SLP noted that pt had what appeared to be thin liquid water and honey thick liquids at bedside.  RN and nurse tech were informed of pt's currently recommended diet and signs were posted in pt's room to facilitate carryover.  Inappropriate liquids were removed from bedside and additional thickener was ordered from the pharmacy.  SLP facilitated the session with trials of regular water following thorough oral care to continue working towards liquids advancement.  Pt continues to present with suspected delayed swallow initiation.  She was also noted with audible swallow and soft delayed throat clear x1 when consuming ~3 oz of thin liquids.  No difficulty with meals reported.  SLP recommends that pt remain on nectar thick liquids for continued observation and conservative diet progression.        HPI Other Pertinent Information: [name] is a 57 F with poorly controlled DM2, chart history of foot osteomyelitis, and L footvaginal cancer and cervical cancer s/p excision and definitive radiation therapy, respectively who presented [facility] on 7/2 with foot pain and was found to be hypotensive. She was subsequently found to have gas gangrene of the foot and was taken emergently to surgery. She  was intubated and sedated.  Pt decannulated on 06/06/2015   Pertinent Vitals Pain Assessment: No/denies pain Faces Pain Scale: Hurts even more Pain Location: Lt residual limb Pain Descriptors / Indicators: Grimacing;Guarding Pain Intervention(s): Monitored during session;Repositioned  SLP  Plan  Continue with current plan of care    Recommendations Diet recommendations: Dysphagia 3 (mechanical soft);Nectar-thick liquid Liquids provided via: Cup Medication Administration: Whole meds with puree Supervision: Patient able to self feed;Intermittent supervision to cue for compensatory strategies Compensations: Slow rate;Small sips/bites Postural Changes and/or Swallow Maneuvers: Seated upright 90 degrees              General recommendations: Rehab consult Oral Care Recommendations: Oral care BID Follow up Recommendations: Outpatient SLP;Home health SLP Plan: Continue with current plan of care    GO     PageSelinda Orion 06/07/2015, 12:18 PM

## 2015-06-07 NOTE — Progress Notes (Signed)
Patient doing well on floor Plan for repeat I&D tomorrow and likely closure of wounds NPO after midnight  N. Eduard Roux, MD [place] 12:29 PM

## 2015-06-07 NOTE — Progress Notes (Signed)
PULMONARY / CRITICAL CARE MEDICINE   Name: [name] MRN: [id number] DOB: March 09, 1966    ADMISSION DATE:  06/01/2015  REFERRING MD :  ER  CHIEF COMPLAINT:  Left Foot Pain  BRIEF DESCRIPTION:  49 yo female with hx of DM presented to ER with Lt foot pain x one week associated with open wound.  Found to have septic shock and gangrene of Lt lower extremity.  STUDIES:  7/02 Xray Lt foot >> extensive soft tissue gas Urine 7/02 >> E. Coli Blood 7/02 >> GPC in clusters Wound 7/03 >> GPC in pairs, GN rods, Moderate Staph Aureus  SIGNIFICANT EVENTS: 7/02 Admit, ortho consulted 7/03 Lt [place] amputation 7/04 Lt leg amputation through tibia, fibula 7/05 Return to OR for further debridement  SUBJECTIVE:  No acute complaints overnight. Denies pain. Tolerated extubation well.  VITAL SIGNS: Temp:  [97.9 F (36.6 C)-99.1 F (37.3 C)] 98.6 F (37 C) (07/08 0500) Pulse Rate:  [80-98] 81 (07/08 0500) Resp:  [14-19] 17 (07/08 0600) BP: (103-130)/(54-87) 103/66 mmHg (07/08 0500) SpO2:  [90 %-100 %] 99 % (07/08 0500) HEMODYNAMICS:   INTAKE / OUTPUT:  Intake/Output Summary (Last 24 hours) at 06/07/15 1030 Last data filed at 06/07/15 0900  Gross per 24 hour  Intake    610 ml  Output   2230 ml  Net  -1620 ml    PHYSICAL EXAMINATION: General: 49yo female, alert, in no apparent distress, co pain at operative site Neuro:  Alert, moves extremities spontaneously HEENT:  PERRLA, MMM Cardiovascular:  S1 and S2 noted, no murmur, regular rate and rhythm Lungs: CTAB, no wheezes, no increased work of breathing Abdomen:  Soft, non-distended, non tender Skin:  wound dressing clean on left leg, wound vac in place  LABS:  CBC  Recent Labs Lab 06/03/15 0100 06/03/15 0805 06/04/15 0357 06/06/15 0509  WBC 12.4*  --  12.1* 10.6*  HGB 9.8* 10.2* 8.4* 8.4*  HCT 27.8* 30.0* 24.1* 25.2*  PLT 428*  --  366 366   Coag's  Recent Labs Lab 06/02/15 0415  APTT 31  INR 1.43    BMET  Recent Labs Lab 06/05/15 0440 06/06/15 0509 06/07/15 0402  NA 145 141 136  K 3.3* 3.1* 4.0  CL 109 102 101  CO2 31 31 29   BUN 9 <5* 6  CREATININE 0.59 0.47 0.61  GLUCOSE 141* 116* 274*   Electrolytes   Recent Labs Lab 06/02/15 0415 06/03/15 0100 06/03/15 1150 06/04/15 0357 06/05/15 0440 06/06/15 0509 06/07/15 0402  CALCIUM 6.9* 7.1* 6.9* 7.2* 7.4* 7.5* 7.4*  MG 1.9  1.9 1.7 2.0 1.9  --   --   --   PHOS 3.2 2.7  --   --   --   --   --    Sepsis Markers  Recent Labs Lab 06/02/15 0415  06/02/15 1430 06/02/15 1820 06/03/15 0100  LATICACIDVEN 1.5  < > 1.3 1.2 1.3  PROCALCITON 14.82  --   --   --   --   < > = values in this interval not displayed. ABG  Recent Labs Lab 06/03/15 0423 06/03/15 0805 06/04/15 0350  PHART 7.401 7.486* 7.435  PCO2ART 41.7 36.1 46.8*  PO2ART 84.3 220.0* 116.0*   Liver Enzymes  Recent Labs Lab 06/01/15 2129 06/02/15 0415 06/03/15 0100  AST 28 29 16   ALT 22 22 18   ALKPHOS 193* 165* 140*  BILITOT 1.6* 1.2 1.1  ALBUMIN 2.3* 1.8* 1.9*   Cardiac Enzymes  Recent Labs Lab 06/02/15 0415  06/02/15 0923 06/02/15 1520  TROPONINI <0.03 0.03 0.06*   Glucose  Recent Labs Lab 06/06/15 0319 06/06/15 0838 06/06/15 1124 06/06/15 1657 06/06/15 2126 06/07/15 0719  GLUCAP 165* 132* 131* 209* 187* 245*    Imaging No results found. ASSESSMENT / PLAN:  PULMONARY ETT 7/03 >>7/7 A: Acute respiratory failure 2nd to septic shock and acidosis--Resolved  P:   Extubation 7/7 Monitor respiratory status following extubation  CARDIOVASCULAR Lt [place] CVL 7/03 >> A:  Septic shock.  P:  Currently off of pressors Monitor BP  RENAL Lab Results  Component Value Date   CREATININE 0.61 06/07/2015   CREATININE 0.47 06/06/2015   CREATININE 0.59 06/05/2015   CREATININE 0.68 12/20/2014   CREATININE 0.68 10/05/2012    Recent Labs Lab 06/05/15 0440 06/06/15 0509 06/07/15 0402  K 3.3* 3.1* 4.0     A:   AKI 2nd  to septic shock.(resolved) Lactic acidosis--resolved. Hypokalemia--resolved  P:   Volume resuscitate Monitor renal fx, urine outpt Replete potassium  GASTROINTESTINAL A:   Nutrition. Nausea/Vomiting  P:   Swallow evaluation. Dys 3 diet  HEMATOLOGIC  Recent Labs  06/06/15 0509  HGB 8.4*    A:   Anemia of critical illness and chronic disease.  P:  F/u CBC Lovenox  INFECTIOUS A:   Septic shock 2nd to Lt lower extremity gangrene s/p amputation. Shock resolved Candidal vulvovaginitis.  P:   Post-op/wound care per ortho, wound care team Contact isolation due to concern for necrotizing fasciitis Diflucan x one on 7/04, lotrimin cream To OR again 7/6 for further debridement Planned debride 7/9  Clindamycin 7/3>>7/5 Zosyn 7/2>>7/5 Vancomycin 7/2>>7/6 *CTX 7/5>> Clotrimazole vaginal cream, Completed Diflucan x1 7/4  Returning to OR on Saturday for repeat I&D and likely closure.  ID Consulted  ENDOCRINE CBG (last 3)   Recent Labs  06/06/15 1657 06/06/15 2126 06/07/15 0719  GLUCAP 209* 187* 245*     A:   DM type II.  P:   Moderate Insulin Sliding Scale Increase Lantus 14 units nightly  NEUROLOGIC A:   Acute encephalopathy 2nd to septic shock. Resolved 7/7 Limb pain  P:   Continue to monitor Increase narcotics 7/8  Today's Summary: Transferred to floor 7/7. Restart Lantus and switch to moderate insulin sliding scale. Continue antibiotics. Return to OR on Saturday 7/9 for further debridement and closure. Oxycodone and Tylenol PRN pain.  Traid to assume care 7/9.  Brett Canales Emery Binz ACNP [facility] Pager [phone] till 3 pm If no answer page [phone] 06/07/2015, 10:30 AM

## 2015-06-07 NOTE — H&P (Signed)

## 2015-06-07 NOTE — Progress Notes (Signed)
Brief Nutrition Follow-Up Note  Chart reviewed. Pt was transferred out of ICU to surgical floor yesterday afternoon.  Pt underwent SLP evaluation on 06/06/15, which revealed mild pharyngeal dysphagia and delayed swallow initiation. Pt has been advanced to a dysphagia 3 diet with nectar thick liquids, however, SLP predicts quick progression/advancement of textures.   Pt consumed 80% of breakfast this morning. Pt with wound vac to to BKA site; plan to return to OR on 06/08/15 for further debridement and closure of wound. RD will will Prostat and MVI due to increased nutrient needs for wound healing.  RD will continue to follow.   Murel Shenberger A. Jimmye Norman, RD, LDN, CDE Pager: [phone] After hours Pager: [phone]

## 2015-06-07 NOTE — Evaluation (Signed)
Physical Therapy Evaluation Patient Details Name: Lachlyn Vanderstelt MRN: [id number] DOB: 12/09/1965 Today's Date: 06/07/2015   History of Present Illness  49 y.o. 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  Clinical Impression  Patient is s/p above procedures presenting with functional limitations due to the deficits listed below (see PT Problem List). Min-mod assist for transfers using a rolling walker to stand and pivot with loss of balance to the posterior. States she lives at home with her husband who can provide 24 hour care at discharge. Would benefit from inpatient rehab prior to d/c home to manage her medical complexities and improve her functional independence and safety. Patient will benefit from skilled PT to increase their independence and safety with mobility to allow discharge to the venue listed below.       Follow Up Recommendations CIR    Equipment Recommendations   (TBD)    Recommendations for Other Services Rehab consult;OT consult     Precautions / Restrictions Precautions Precautions: Fall Restrictions Weight Bearing Restrictions: Yes LLE Weight Bearing: Non weight bearing      Mobility  Bed Mobility Overal bed mobility: Needs Assistance Bed Mobility: Supine to Sit     Supine to sit: Min guard;HOB elevated     General bed mobility comments: Close guard for safety. Requires extra time and effortful to scoot to edge of bed but able to perform without physical assist.  Transfers Overall transfer level: Needs assistance Equipment used: Rolling walker (2 wheeled) Transfers: Sit to/from [facility] Sit to Stand: Min assist Stand pivot transfers: Mod assist       General transfer comment: Min assist for boost and balance to stand from bed, VC for hand placement. Mod assist for balance with pivot to  chair, LOB to posterior. Diffiiculty stepping backwards. VC for technique throughout.  Ambulation/Gait                Stairs            Wheelchair Mobility    Modified Rankin (Stroke Patients Only)       Balance Overall balance assessment: Needs assistance Sitting-balance support: No upper extremity supported;Feet supported Sitting balance-Leahy Scale: Fair     Standing balance support: Bilateral upper extremity supported Standing balance-Leahy Scale: Poor                               Pertinent Vitals/Pain Pain Assessment: Faces Faces Pain Scale: Hurts even more Pain Location: Lt residual limb Pain Descriptors / Indicators: Grimacing;Guarding Pain Intervention(s): Monitored during session;Repositioned    Home Living Family/patient expects to be discharged to:: Private residence Living Arrangements: Spouse/significant other Available Help at Discharge: Family;Available 24 hours/day Type of Home: House Home Access: Stairs to enter Entrance Stairs-Rails: Left Entrance Stairs-Number of Steps: 5 Home Layout: One level Home Equipment: Bedside commode      Prior Function Level of Independence: Independent               Hand Dominance   Dominant Hand:  ("Does not write")    Extremity/Trunk Assessment   Upper Extremity Assessment: Defer to OT evaluation           Lower Extremity Assessment: LLE deficits/detail   LLE Deficits / Details: Limited assessment due to pain. Guarding, limited strength as expected post-op     Communication  Communication: Prefers language other than [place];Other (comment) (States she understands some english)  Cognition Arousal/Alertness: Awake/alert Behavior During Therapy: WFL for tasks assessed/performed Overall Cognitive Status: Difficult to assess                      General Comments General comments (skin integrity, edema, etc.): Pt with no questions at this time. Discussed CIR and  pt is interested.    Exercises Amputee Exercises Quad Sets: AROM;Left;Strengthening;10 reps;Seated Knee Flexion: AROM;Left;10 reps;Seated Knee Extension: AROM;Left;10 reps      Assessment/Plan    PT Assessment Patient needs continued PT services  PT Diagnosis Difficulty walking;Acute pain   PT Problem List Decreased strength;Decreased range of motion;Decreased activity tolerance;Decreased balance;Decreased mobility;Decreased knowledge of use of DME;Decreased knowledge of precautions;Pain  PT Treatment Interventions DME instruction;Gait training;Functional mobility training;Stair training;Therapeutic activities;Therapeutic exercise;Balance training;Neuromuscular re-education;Patient/family education;Wheelchair mobility training;Modalities   PT Goals (Current goals can be found in the Care Plan section) Acute Rehab PT Goals Patient Stated Goal: Get more rehabilitation before going home PT Goal Formulation: With patient Time For Goal Achievement: 06/21/15 Potential to Achieve Goals: Good    Frequency Min 5X/week   Barriers to discharge        Co-evaluation               End of Session Equipment Utilized During Treatment: Gait belt Activity Tolerance: Patient tolerated treatment well Patient left: in chair;with call bell/phone within reach Nurse Communication: Mobility status         Time: 2025-4270 PT Time Calculation (min) (ACUTE ONLY): 26 min   Charges:   PT Evaluation $Initial PT Evaluation Tier I: 1 Procedure PT Treatments $Therapeutic Activity: 8-22 mins   PT G CodesEllouise Newer 06/07/2015, 10:27 AM Elayne Snare, [place]

## 2015-06-08 ENCOUNTER — Inpatient Hospital Stay (HOSPITAL_COMMUNITY): Payer: Medicaid Other | Admitting: Anesthesiology

## 2015-06-08 ENCOUNTER — Encounter (HOSPITAL_COMMUNITY): Payer: Self-pay | Admitting: Certified Registered Nurse Anesthetist

## 2015-06-08 ENCOUNTER — Encounter (HOSPITAL_COMMUNITY)
Admission: EM | Disposition: A | Discharge status: Rehabilitation Facility | Payer: Self-pay | Source: Home / Self Care | Attending: Internal Medicine

## 2015-06-08 HISTORY — PX: I & D EXTREMITY: SHX5045

## 2015-06-08 LAB — CULTURE, BLOOD (ROUTINE X 2)

## 2015-06-08 LAB — BASIC METABOLIC PANEL
Anion gap: 6 (ref 5–15)
BUN: 9 mg/dL (ref 6–20)
CO2: 30 mmol/L (ref 22–32)
Calcium: 7.6 mg/dL — ABNORMAL LOW (ref 8.9–10.3)
Chloride: 100 mmol/L — ABNORMAL LOW (ref 101–111)
Creatinine, Ser: 0.57 mg/dL (ref 0.44–1.00)
GFR calc Af Amer: >60 mL/min (ref >60)
Glucose, Bld: 294 mg/dL — ABNORMAL HIGH (ref 65–99)
POTASSIUM: 3.8 mmol/L (ref 3.5–5.1)
Sodium: 136 mmol/L (ref 135–145)

## 2015-06-08 LAB — CBC
HCT: 24.2 % — ABNORMAL LOW (ref 36.0–46.0)
HEMOGLOBIN: 8 g/dL — AB (ref 12.0–15.0)
MCH: 28.9 pg (ref 26.0–34.0)
MCHC: 33.1 g/dL (ref 30.0–36.0)
MCV: 87.4 fL (ref 78.0–100.0)
Platelets: 374 10*3/uL (ref 150–400)
RBC: 2.77 MIL/uL — ABNORMAL LOW (ref 3.87–5.11)
RDW: 13.6 % (ref 11.5–15.5)
WBC: 7.2 10*3/uL (ref 4.0–10.5)

## 2015-06-08 LAB — GLUCOSE, CAPILLARY
GLUCOSE-CAPILLARY: 303 mg/dL — AB (ref 65–99)
GLUCOSE-CAPILLARY: 136 mg/dL — AB (ref 65–99)
Glucose-Capillary: 167 mg/dL — ABNORMAL HIGH (ref 65–99)
Glucose-Capillary: 187 mg/dL — ABNORMAL HIGH (ref 65–99)
Glucose-Capillary: 159 mg/dL — ABNORMAL HIGH (ref 65–99)

## 2015-06-08 LAB — MAGNESIUM: Magnesium: 2 mg/dL (ref 1.7–2.4)

## 2015-06-08 LAB — PHOSPHORUS: PHOSPHORUS: 3.3 mg/dL (ref 2.5–4.6)

## 2015-06-08 SURGERY — IRRIGATION AND DEBRIDEMENT EXTREMITY
Anesthesia: General | Site: Leg Upper | Laterality: Left

## 2015-06-08 MED ORDER — FENTANYL CITRATE (PF) 100 MCG/2ML IJ SOLN
INTRAMUSCULAR | Status: DC | PRN
  Administered 2015-06-08: 25 ug via INTRAVENOUS

## 2015-06-08 MED ORDER — EPHEDRINE SULFATE 50 MG/ML IJ SOLN
INTRAMUSCULAR | Status: DC | PRN
  Administered 2015-06-08: 5 mg via INTRAVENOUS

## 2015-06-08 MED ORDER — SODIUM CHLORIDE 0.9 % IR SOLN
Status: DC | PRN
  Administered 2015-06-08: 1000 mL

## 2015-06-08 MED ORDER — PROMETHAZINE HCL 25 MG/ML IJ SOLN: 6.2500 mg | INTRAMUSCULAR | Status: DC | PRN

## 2015-06-08 MED ORDER — PROPOFOL 10 MG/ML IV BOLUS
INTRAVENOUS | Status: DC | PRN
  Administered 2015-06-08: 100 mg via INTRAVENOUS

## 2015-06-08 MED ORDER — VANCOMYCIN HCL 1000 MG IV SOLR
INTRAVENOUS | Status: DC | PRN
  Administered 2015-06-08: 1000 mg

## 2015-06-08 MED ORDER — MIDAZOLAM HCL 2 MG/2ML IJ SOLN
INTRAMUSCULAR | Status: AC
  Filled 2015-06-08: qty 2

## 2015-06-08 MED ORDER — FENTANYL CITRATE (PF) 250 MCG/5ML IJ SOLN
INTRAMUSCULAR | Status: AC
Start: 2015-06-08 — End: 2015-06-08
  Filled 2015-06-08: qty 5

## 2015-06-08 MED ORDER — VANCOMYCIN HCL 1000 MG IV SOLR
INTRAVENOUS | Status: AC
  Filled 2015-06-08: qty 1000

## 2015-06-08 MED ORDER — LACTATED RINGERS IV SOLN
INTRAVENOUS | Status: DC | PRN
  Administered 2015-06-08: 08:00:00 via INTRAVENOUS

## 2015-06-08 MED ORDER — HYDROMORPHONE HCL 1 MG/ML IJ SOLN
0.2500 mg | INTRAMUSCULAR | Status: DC | PRN
  Administered 2015-06-08 (×2): 0.5 mg via INTRAVENOUS

## 2015-06-08 MED ORDER — PROPOFOL 10 MG/ML IV BOLUS
INTRAVENOUS | Status: AC
  Filled 2015-06-08: qty 20

## 2015-06-08 MED ORDER — LIDOCAINE HCL (CARDIAC) 20 MG/ML IV SOLN
INTRAVENOUS | Status: DC | PRN
  Administered 2015-06-08: 60 mg via INTRAVENOUS

## 2015-06-08 MED ORDER — PHENYLEPHRINE HCL 10 MG/ML IJ SOLN
INTRAMUSCULAR | Status: DC | PRN
  Administered 2015-06-08 (×6): 80 ug via INTRAVENOUS

## 2015-06-08 MED ORDER — HYDROMORPHONE HCL 1 MG/ML IJ SOLN
INTRAMUSCULAR | Status: AC
  Filled 2015-06-08: qty 1

## 2015-06-08 SURGICAL SUPPLY — 65 items
BANDAGE ELASTIC 3 VELCRO ST LF (GAUZE/BANDAGES/DRESSINGS) IMPLANT
BLADE SURG 10 STRL SS (BLADE) ×3 IMPLANT
BNDG COHESIVE 1X5 TAN STRL LF (GAUZE/BANDAGES/DRESSINGS) IMPLANT
BNDG COHESIVE 4X5 TAN STRL (GAUZE/BANDAGES/DRESSINGS) ×3 IMPLANT
BNDG COHESIVE 6X5 TAN STRL LF (GAUZE/BANDAGES/DRESSINGS) ×4 IMPLANT
BNDG CONFORM 3 STRL LF (GAUZE/BANDAGES/DRESSINGS) IMPLANT
BNDG GAUZE STRTCH 6 (GAUZE/BANDAGES/DRESSINGS) ×9 IMPLANT
CORDS BIPOLAR (ELECTRODE) IMPLANT
COVER SURGICAL LIGHT HANDLE (MISCELLANEOUS) ×3 IMPLANT
CUFF TOURNIQUET SINGLE 24IN (TOURNIQUET CUFF) IMPLANT
CUFF TOURNIQUET SINGLE 34IN LL (TOURNIQUET CUFF) ×6 IMPLANT
CUFF TOURNIQUET SINGLE 44IN (TOURNIQUET CUFF) IMPLANT
DRAPE EXTREMITY BILATERAL (DRAPE) IMPLANT
DRAPE IMP U-DRAPE 54X76 (DRAPES) IMPLANT
DRAPE INCISE IOBAN 66X45 STRL (DRAPES) ×12 IMPLANT
DRAPE SURG 17X23 STRL (DRAPES) IMPLANT
DRAPE U-SHAPE 47X51 STRL (DRAPES) ×3 IMPLANT
DRSG PAD ABDOMINAL 8X10 ST (GAUZE/BANDAGES/DRESSINGS) ×4 IMPLANT
DURAPREP 26ML APPLICATOR (WOUND CARE) ×3 IMPLANT
ELECT CAUTERY BLADE 6.4 (BLADE) ×3 IMPLANT
ELECT REM PT RETURN 9FT ADLT (ELECTROSURGICAL)
ELECTRODE REM PT RTRN 9FT ADLT (ELECTROSURGICAL) IMPLANT
FACESHIELD WRAPAROUND (MASK) IMPLANT
FACESHIELD WRAPAROUND OR TEAM (MASK) IMPLANT
GAUZE SPONGE 4X4 12PLY STRL (GAUZE/BANDAGES/DRESSINGS) ×6 IMPLANT
GAUZE XEROFORM 1X8 LF (GAUZE/BANDAGES/DRESSINGS) ×3 IMPLANT
GAUZE XEROFORM 5X9 LF (GAUZE/BANDAGES/DRESSINGS) ×3 IMPLANT
GLOVE NEODERM STRL 7.5 LF PF (GLOVE) ×2 IMPLANT
GLOVE SURG NEODERM 7.5  LF PF (GLOVE) ×4
GOWN STRL REIN XL XLG (GOWN DISPOSABLE) ×6 IMPLANT
HANDPIECE INTERPULSE COAX TIP (DISPOSABLE)
KIT BASIN OR (CUSTOM PROCEDURE TRAY) ×3 IMPLANT
KIT ROOM TURNOVER OR (KITS) ×3 IMPLANT
KIT STIMULAN RAPID CURE 5CC (Orthopedic Implant) ×2 IMPLANT
MANIFOLD NEPTUNE II (INSTRUMENTS) ×3 IMPLANT
NS IRRIG 1000ML POUR BTL (IV SOLUTION) ×6 IMPLANT
PACK ORTHO EXTREMITY (CUSTOM PROCEDURE TRAY) ×3 IMPLANT
PAD ABD 8X10 STRL (GAUZE/BANDAGES/DRESSINGS) ×3 IMPLANT
PAD ARMBOARD 7.5X6 YLW CONV (MISCELLANEOUS) ×6 IMPLANT
PADDING CAST ABS 4INX4YD NS (CAST SUPPLIES) ×4
PADDING CAST ABS COTTON 4X4 ST (CAST SUPPLIES) ×2 IMPLANT
PADDING CAST COTTON 6X4 STRL (CAST SUPPLIES) ×3 IMPLANT
SET HNDPC FAN SPRY TIP SCT (DISPOSABLE) IMPLANT
SPONGE GAUZE 4X4 12PLY STER LF (GAUZE/BANDAGES/DRESSINGS) ×2 IMPLANT
SPONGE LAP 18X18 X RAY DECT (DISPOSABLE) ×6 IMPLANT
STOCKINETTE IMPERVIOUS 9X36 MD (GAUZE/BANDAGES/DRESSINGS) ×3 IMPLANT
SUT ETHILON 2 0 FS 18 (SUTURE) ×9 IMPLANT
SUT ETHILON 2 0 PSLX (SUTURE) ×3 IMPLANT
SUT ETHILON 3 0 PS 1 (SUTURE) ×6 IMPLANT
SUT MNCRL AB 3-0 PS2 18 (SUTURE) ×4 IMPLANT
SUT MON AB 2-0 CT1 36 (SUTURE) ×4 IMPLANT
SUT PDS AB 0 CT 36 (SUTURE) ×4 IMPLANT
SUT VIC AB 2-0 CT1 36 (SUTURE) ×3 IMPLANT
SUT VIC AB 2-0 FS1 27 (SUTURE) ×6 IMPLANT
SYR CONTROL 10ML LL (SYRINGE) IMPLANT
TOWEL OR 17X24 6PK STRL BLUE (TOWEL DISPOSABLE) ×3 IMPLANT
TOWEL OR 17X26 10 PK STRL BLUE (TOWEL DISPOSABLE) ×3 IMPLANT
TUBE ANAEROBIC SPECIMEN COL (MISCELLANEOUS) IMPLANT
TUBE CONNECTING 12'X1/4 (SUCTIONS) ×1
TUBE CONNECTING 12X1/4 (SUCTIONS) ×2 IMPLANT
TUBE FEEDING 5FR 15 INCH (TUBING) IMPLANT
TUBING CYSTO DISP (UROLOGICAL SUPPLIES) ×3 IMPLANT
UNDERPAD 30X30 INCONTINENT (UNDERPADS AND DIAPERS) ×6 IMPLANT
WATER STERILE IRR 1000ML POUR (IV SOLUTION) ×3 IMPLANT
YANKAUER SUCT BULB TIP NO VENT (SUCTIONS) ×3 IMPLANT

## 2015-06-08 NOTE — Brief Op Note (Signed)
   Brief Op Note  Date of Surgery: 06/08/2015  Preoperative Diagnosis: LEFT LEG INFECTION  Postoperative Diagnosis: same  Procedure: Procedure(s): IRRIGATION AND DEBRIDEMENT LEFT LEG, WITH  CLOSURE OF BKA  Implants: none  Surgeons: Surgeon(s): Naiping Ephriam Jenkins, MD  Anesthesia: General  Drains: none  Estimated Blood Loss: See anesthesia record  Complications: None  Condition to PACU: Stable  Naiping Eduard Roux, MD [place] 06/08/2015 9:08 AM

## 2015-06-08 NOTE — Anesthesia Postprocedure Evaluation (Signed)
  Anesthesia Post-op Note  Patient: [name]  Procedure(s) Performed: Procedure(s): IRRIGATION AND DEBRIDEMENT LEFT LEG, WITH  CLOSURE OF BKA (Left)  Patient Location: PACU  Anesthesia Type:General  Level of Consciousness: awake and alert   Airway and Oxygen Therapy: Patient Spontanous Breathing  Post-op Pain: none  Post-op Assessment: Post-op Vital signs reviewed              Post-op Vital Signs: Reviewed  Last Vitals:  Filed Vitals:   06/08/15 1003  BP: 125/66  Pulse: 89  Temp: 36.7 C  Resp: 10    Complications: No apparent anesthesia complications

## 2015-06-08 NOTE — Anesthesia Procedure Notes (Signed)
Procedure Name: LMA Insertion Date/Time: 06/08/2015 8:31 AM Performed by: Ollen Bowl Pre-anesthesia Checklist: Patient identified, Emergency Drugs available, Suction available, Patient being monitored and Timeout performed Patient Re-evaluated:Patient Re-evaluated prior to inductionOxygen Delivery Method: Circle system utilized and Simple face mask Preoxygenation: Pre-oxygenation with 100% oxygen Intubation Type: IV induction Ventilation: Mask ventilation without difficulty LMA: LMA inserted LMA Size: 4.0 Number of attempts: 1 Airway Equipment and Method: Patient positioned with wedge pillow Placement Confirmation: positive ETCO2 and breath sounds checked- equal and bilateral Tube secured with: Tape Dental Injury: Teeth and Oropharynx as per pre-operative assessment

## 2015-06-08 NOTE — Anesthesia Preprocedure Evaluation (Addendum)
Anesthesia Evaluation  Patient identified by MRN, date of birth, ID band Patient unresponsive    Reviewed: Allergy & Precautions, NPO status , Patient's Chart, lab work & pertinent test results  History of Anesthesia Complications Negative for: history of anesthetic complications  Airway Mallampati: I  TM Distance: >3 FB Neck ROM: Full    Dental no notable dental hx.    Pulmonary neg pulmonary ROS,  Patient intubated breath sounds clear to auscultation  Pulmonary exam normal       Cardiovascular Normal cardiovascular examRhythm:Regular Rate:Normal     Neuro/Psych negative neurological ROS  negative psych ROS   GI/Hepatic negative GI ROS, Neg liver ROS,   Endo/Other  diabetes, Type 2, Insulin Dependent  Renal/GU      Musculoskeletal   Abdominal   Peds  Hematology  (+) anemia ,   Anesthesia Other Findings   Reproductive/Obstetrics negative OB ROS                            Anesthesia Physical  Anesthesia Plan  ASA: III  Anesthesia Plan: General   Post-op Pain Management:    Induction: Intravenous  Airway Management Planned: LMA  Additional Equipment:   Intra-op Plan:   Post-operative Plan:   Informed Consent: I have reviewed the patients History and Physical, chart, labs and discussed the procedure including the risks, benefits and alternatives for the proposed anesthesia with the patient or authorized representative who has indicated his/her understanding and acceptance.     Plan Discussed with: CRNA  Anesthesia Plan Comments:        Anesthesia Quick Evaluation

## 2015-06-08 NOTE — Progress Notes (Signed)
Received pt back from PACU s/p revision of left BKA and closure.  Pt states she is comfortable at present.  o2 2l N/C.

## 2015-06-08 NOTE — Op Note (Signed)
   Date of Surgery: 06/08/2015  INDICATIONS: [name] is a 49 y.o.-year-old female who returns for repeat washout of her left leg wound.  The patient did consent to the procedure after discussion of the risks and benefits.  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  PROCEDURE:  1. Adjacent tissue rearrangement 7 x 14 cm 2. Debridement of bone, muscle, skin 7 x 14 cm  SURGEON: N. Eduard Roux, M.D.  ASSIST: none.  ANESTHESIA:  general  IV FLUIDS AND URINE: See anesthesia.  ESTIMATED BLOOD LOSS: minimal mL.  IMPLANTS: none  DRAINS: none  COMPLICATIONS: None.  DESCRIPTION OF PROCEDURE: The patient was brought to the operating room and placed supine on the operating table.  The patient had been signed prior to the procedure and this was documented. The patient had the anesthesia placed by the anesthesiologist.  A time-out was performed to confirm that this was the correct patient, site, side and location. The patient had an SCD on the opposite lower extremity. The patient did receive antibiotics prior to the incision and was re-dosed during the procedure as needed at indicated intervals.  The patient had the operative extremity prepped and draped in the standard surgical fashion.    Sharp excisional debridement bone, muscle and skin was performed on the leg.  After thorough debridement, the wound was thoroughly irrigated with normal saline.  10 cc of vancomycin impregnated antibiotic beads were placed.  The wound was then closed in a layer fashion using 0 PDS, 2.0 monocryl, and 2.0 nylon.  Adjacent tissue rearrangement was performed in order to close the wound.  Sterile dressings were applied.  The patient was extubated and transferred to the PACU in stable condition.  POSTOPERATIVE PLAN: Patient will need a few more weeks of antibiotics per ID.  She will need to follow up in 2 weeks.  [name], MD [name] [phone] 1:13 PM

## 2015-06-08 NOTE — Transfer of Care (Signed)
Immediate Anesthesia Transfer of Care Note  Patient: [name]  Procedure(s) Performed: Procedure(s): IRRIGATION AND DEBRIDEMENT LEFT LEG, WITH  CLOSURE OF BKA (Left)  Patient Location: PACU  Anesthesia Type:General  Level of Consciousness: awake, alert  and oriented  Airway & Oxygen Therapy: Patient Spontanous Breathing and Patient connected to nasal cannula oxygen  Post-op Assessment: Report given to RN, Post -op Vital signs reviewed and stable and Patient moving all extremities X 4  Post vital signs: Reviewed and stable  Last Vitals:  Filed Vitals:   06/08/15 0604  BP: 107/54  Pulse: 83  Temp: 36.7 C  Resp: 17    Complications: No apparent anesthesia complications

## 2015-06-08 NOTE — Progress Notes (Signed)
Physical Therapy Treatment Patient Details Name: [name] MRN: [id number] DOB: [birth year] Today's Date: 06/08/2015    History of Present Illness 49 y.o. 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 IRRIGATION AND DEBRIDEMENT LEFT LEG, WITH CLOSURE OF BKA 7/9.    PT Comments    Patient is progressing well towards physical therapy goals, ambulating up to 10 feet today with min assist while using a rolling walker. Motivated to work with therapy and states she has been performing exercises on her own since our previous visit. Patient will continue to benefit from skilled physical therapy services to further improve independence with functional mobility.    Follow Up Recommendations  CIR     Equipment Recommendations   (TBD)    Recommendations for Other Services Rehab consult;OT consult     Precautions / Restrictions Precautions Precautions: Fall Restrictions Weight Bearing Restrictions: Yes LLE Weight Bearing: Non weight bearing    Mobility  Bed Mobility Overal bed mobility: Needs Assistance Bed Mobility: Supine to Sit     Supine to sit: Min guard;HOB elevated     General bed mobility comments: Close guard for safety. Requires extra time. Able to perform without physical assist.  Transfers Overall transfer level: Needs assistance Equipment used: Rolling walker (2 wheeled) Transfers: Sit to/from Stand Sit to Stand: Min assist         General transfer comment: Min assist for boost to stand from lowest bed setting. VC for hand placement. Slow to rise but improved balance today.  Ambulation/Gait Ambulation/Gait assistance: Min assist Ambulation Distance (Feet): 10 Feet Assistive device: Rolling walker (2 wheeled) Gait Pattern/deviations:  ("hop-to" pattern) Gait velocity: decreased Gait velocity interpretation: <1.8  ft/sec, indicative of risk for recurrent falls General Gait Details: Educated on safe DME use with a rolling walker. Pt able to take small steady hops with min assist for walker placement.   Stairs            Wheelchair Mobility    Modified Rankin (Stroke Patients Only)       Balance                                    Cognition Arousal/Alertness: Awake/alert Behavior During Therapy: WFL for tasks assessed/performed Overall Cognitive Status: Difficult to assess                      Exercises Amputee Exercises Quad Sets: AROM;Left;Strengthening;10 reps;Seated Hip Flexion/Marching: Strengthening;Left;10 reps;Seated Knee Flexion: AROM;Left;10 reps;Seated Knee Extension: AROM;Left;10 reps;Seated    General Comments        Pertinent Vitals/Pain Pain Assessment: Faces Faces Pain Scale: Hurts little more Pain Descriptors / Indicators: Grimacing Pain Intervention(s): Monitored during session;Repositioned    Home Living                      Prior Function            PT Goals (current goals can now be found in the care plan section) Acute Rehab PT Goals Patient Stated Goal: Get more rehabilitation before going home PT Goal Formulation: With patient Time For Goal Achievement: 06/21/15 Potential to Achieve Goals: Good Progress towards PT goals: Progressing toward goals    Frequency  Min 5X/week    PT Plan Current plan remains appropriate    Co-evaluation  End of Session Equipment Utilized During Treatment: Gait belt Activity Tolerance: Patient tolerated treatment well Patient left: in chair;with call bell/phone within reach     Time: 1517-1530 PT Time Calculation (min) (ACUTE ONLY): 13 min  Charges:  $Therapeutic Activity: 8-22 mins                    G Codes:      Ellouise Newer Jun 25, 2015, 4:46 PM Camille Bal Klein, [place]

## 2015-06-09 ENCOUNTER — Encounter (HOSPITAL_COMMUNITY): Payer: Self-pay | Admitting: Internal Medicine

## 2015-06-09 DIAGNOSIS — D638 Anemia in other chronic diseases classified elsewhere: Secondary | ICD-10-CM | POA: Diagnosis present

## 2015-06-09 DIAGNOSIS — E119 Type 2 diabetes mellitus without complications: Secondary | ICD-10-CM

## 2015-06-09 LAB — CBC WITH DIFFERENTIAL/PLATELET
Basophils Absolute: 0 10*3/uL (ref 0.0–0.1)
Basophils Relative: 0 % (ref 0–1)
Eosinophils Absolute: 0.2 10*3/uL (ref 0.0–0.7)
Eosinophils Relative: 2 % (ref 0–5)
HEMATOCRIT: 26.2 % — AB (ref 36.0–46.0)
Hemoglobin: 8.7 g/dL — ABNORMAL LOW (ref 12.0–15.0)
LYMPHS ABS: 1.8 10*3/uL (ref 0.7–4.0)
LYMPHS PCT: 17 % (ref 12–46)
MCH: 29.5 pg (ref 26.0–34.0)
MCHC: 33.2 g/dL (ref 30.0–36.0)
MCV: 88.8 fL (ref 78.0–100.0)
MONO ABS: 0.5 10*3/uL (ref 0.1–1.0)
Monocytes Relative: 5 % (ref 3–12)
NEUTROS ABS: 8.4 10*3/uL — AB (ref 1.7–7.7)
Neutrophils Relative %: 76 % (ref 43–77)
Platelets: 432 10*3/uL — ABNORMAL HIGH (ref 150–400)
RBC: 2.95 MIL/uL — ABNORMAL LOW (ref 3.87–5.11)
RDW: 13.4 % (ref 11.5–15.5)
WBC: 10.9 10*3/uL — AB (ref 4.0–10.5)

## 2015-06-09 LAB — COMPREHENSIVE METABOLIC PANEL
ALT: 15 U/L (ref 14–54)
AST: 31 U/L (ref 15–41)
Albumin: 2 g/dL — ABNORMAL LOW (ref 3.5–5.0)
Alkaline Phosphatase: 89 U/L (ref 38–126)
Anion gap: 8 (ref 5–15)
CO2: 29 mmol/L (ref 22–32)
CREATININE: 0.52 mg/dL (ref 0.44–1.00)
Calcium: 8.2 mg/dL — ABNORMAL LOW (ref 8.9–10.3)
Chloride: 98 mmol/L — ABNORMAL LOW (ref 101–111)
GFR calc Af Amer: >60 mL/min (ref >60)
GFR calc non Af Amer: >60 mL/min (ref >60)
Glucose, Bld: 180 mg/dL — ABNORMAL HIGH (ref 65–99)
Potassium: 3.6 mmol/L (ref 3.5–5.1)
Sodium: 135 mmol/L (ref 135–145)
TOTAL PROTEIN: 6.7 g/dL (ref 6.5–8.1)
Total Bilirubin: 0.3 mg/dL (ref 0.3–1.2)

## 2015-06-09 LAB — GLUCOSE, CAPILLARY
GLUCOSE-CAPILLARY: 221 mg/dL — AB (ref 65–99)
Glucose-Capillary: 205 mg/dL — ABNORMAL HIGH (ref 65–99)
Glucose-Capillary: 173 mg/dL — ABNORMAL HIGH (ref 65–99)
Glucose-Capillary: 142 mg/dL — ABNORMAL HIGH (ref 65–99)

## 2015-06-09 MED ORDER — INSULIN GLARGINE 100 UNIT/ML ~~LOC~~ SOLN
5.0000 [IU] | Freq: Every day | SUBCUTANEOUS | Status: DC
  Administered 2015-06-09 – 2015-06-11 (×3): 5 [IU] via SUBCUTANEOUS
  Filled 2015-06-09 (×4): qty 0.05

## 2015-06-09 NOTE — Progress Notes (Signed)
Upon assessment, found left BKA area without dressing. Placed ABD drsg, wrapped with Kerlix and coban dressing.Site looks good.

## 2015-06-09 NOTE — Progress Notes (Signed)
Patient seen and examined at bedside. Patient not in any acute distress. Patient having supper. Denies any pain or any problem breathing or chest pain. Vitals noted - blood pressure systolic is in the 98 with heart rate around 68 bpm and afebrile presently rate 20/m. HEENT - anicteric no pallor. Chest - bilateral air entry present no rhonchi or crepitations. Heart - S1 and S2 heard. Abdomen - soft nontender bowel sounds present. Extremities - left lower extremity has been dressed from recent BKA. Neuro - alert awake oriented to time place and person. Moves all extremities.  Recent labs noted. Medications reviewed.  Assessment and plan: #1. Status post septic shock with MSSA bacteremia secondary to left lower extremity gangrene status post BKA and now - appreciate infectious disease and orthopedics input we will continue with present antibiotics. #2. Diabetes mellitus type 2 - patient is on Lantus and D5W at this time. I'm going to discontinue patient's D5W and decrease patient's Lantus from 14 units to 5 units and closely follow CBGs. I have advised patient's nurse taking care to recheck CBGs in 2 hours after stopping D5W and close after giving Lantus. Patient's on sliding scale coverage. #3. Anemia - I have ordered repeat CBC closely follow.  I have ordered repeat CBC and metabolic panel. Discussed with patient's husband at the bedside.  [name]

## 2015-06-10 ENCOUNTER — Encounter (HOSPITAL_COMMUNITY): Payer: Self-pay | Admitting: Orthopaedic Surgery

## 2015-06-10 DIAGNOSIS — Z89519 Acquired absence of unspecified leg below knee: Secondary | ICD-10-CM | POA: Insufficient documentation

## 2015-06-10 DIAGNOSIS — D638 Anemia in other chronic diseases classified elsewhere: Secondary | ICD-10-CM

## 2015-06-10 LAB — CULTURE, BLOOD (ROUTINE X 2)
CULTURE: NO GROWTH
CULTURE: NO GROWTH

## 2015-06-10 LAB — GLUCOSE, CAPILLARY
GLUCOSE-CAPILLARY: 147 mg/dL — AB (ref 65–99)
GLUCOSE-CAPILLARY: 200 mg/dL — AB (ref 65–99)
Glucose-Capillary: 189 mg/dL — ABNORMAL HIGH (ref 65–99)
Glucose-Capillary: 213 mg/dL — ABNORMAL HIGH (ref 65–99)
Glucose-Capillary: 225 mg/dL — ABNORMAL HIGH (ref 65–99)

## 2015-06-10 NOTE — Progress Notes (Signed)
Speech Language Pathology Treatment: Dysphagia  Patient Details Name: [name] MRN: [id number] DOB: January 13, 1966 Today's Date: 06/10/2015 Time: 3151-7616 SLP Time Calculation (min) (ACUTE ONLY): 23 min  Assessment / Plan / Recommendation Clinical Impression  Via interpreter phone line on speaker phone, communication took place re: pt's swallow ability.  Pt reports drinking thin water over the weekend due to thicker drinks being "too sweet".  She denies coughing or having problems swallowing.  SLP suspects resolution of acute dysphagia s/p intubation x6 days (extubated 7/7).  Pt does report continued problems with voice not being back to baseline (scale of 0-10, pt give voice a 5).  She inquired to length of time it will require for her voice to return - advised her to speak to MD.  SlP observed pt consuming water - timely swallow, adequate laryngeal elevation and clear voice throughout. No s/s of aspiration apparent.  Recommend to advance diet to regular diet.  SLP reviewed symptoms of dysphagia for pt to be aware.  Intake listed as 45-95% and she is afebrile with lungs decreased.    Will sign off as dysphagia has resolved and all education completed.      HPI Other Pertinent Information: [name] is a 46 F with poorly controlled DM2, chart history of foot osteomyelitis, and L footvaginal cancer and cervical cancer s/p excision and definitive radiation therapy, respectively who presented [facility] on [facility] with foot pain and was found to be hypotensive. She was subsequently found to have gas gangrene of the foot and was taken emergently to surgery. She  was intubated and sedated.  Pt decannulated on 06/06/2015   Pertinent Vitals Pain Assessment: No/denies pain  SLP Plan  All goals met    Recommendations Diet recommendations: Regular;Thin liquid Liquids provided via: Cup;Straw Medication Administration: Whole meds with liquid Supervision: Patient able to self feed Compensations:  Slow rate;Small sips/bites Postural Changes and/or Swallow Maneuvers: Seated upright 90 degrees              Plan: All goals met    [place], [place] [facility] SLP [phone]

## 2015-06-10 NOTE — Progress Notes (Signed)
Occupational Therapy Treatment Patient Details Name: [name] MRN: [id number] DOB: 07/04/1966 Today's Date: 06/10/2015    History of present illness 49 y.o. 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 IRRIGATION AND DEBRIDEMENT LEFT LEG, WITH CLOSURE OF BKA 7/9.   OT comments  Pt overall still needing min assist for sit to stand and dynamic standing balance during toileting and toilet transfers.  She can maintain static standing with min guard assist, even being able to release one UE at a time without LOB as well.  Limited endurance and strength for mobility with pt only tolerating ambulation from the bedside chair to the 3:1 in the bathroom before needing rest.  Noted pt with 2 episodes of right knee buckling after hopping forward as well.  Pts significant other present for session and interpreter utilized via phone.   Follow Up Recommendations  CIR    Equipment Recommendations  Other (comment) (TBD next venue of care)    Recommendations for Other Services Rehab consult    Precautions / Restrictions Precautions Precautions: Fall Restrictions Weight Bearing Restrictions: Yes LLE Weight Bearing: Non weight bearing       Mobility Bed Mobility                  Transfers       Sit to Stand: Min assist Stand pivot transfers: Min assist       General transfer comment: Pt needs mod instructional cueing for hand placement with sit to stand.     Balance     Sitting balance-Leahy Scale: Fair       Standing balance-Leahy Scale: Poor Standing balance comment: Needs use of the RW for support when standing.                   ADL Overall ADL's : Needs assistance/impaired                         Toilet Transfer: Minimal assistance;RW;Ambulation;BSC   Toileting- Clothing Manipulation and Hygiene:  Minimal assistance;Sit to/from stand       Functional mobility during ADLs: Minimal assistance General ADL Comments: Pt currently min assist level for transfers and for dynamic standing balance with use of the RW.  She was able to stand and alternate reaching for cup on her bedside table with min guard assist for balance.  Demonstrates limited endurance for standing tasks and with functional mobiilty.                  Cognition   Behavior During Therapy: WFL for tasks assessed/performed                                      Pertinent Vitals/ Pain       Pain Assessment: No/denies pain         Frequency Min 2X/week     Progress Toward Goals  OT Goals(current goals can now be found in the care plan section)  Progress towards OT goals: Goals met and updated - see care plan  Acute Rehab OT Goals Time For Goal Achievement: 06/17/15 Potential to Achieve Goals: Good  Plan         End of Session Equipment Utilized During Treatment: Gait belt;Rolling walker   Activity Tolerance Patient tolerated treatment well     Occupational Therapy Treatment Patient Details Name: [name] MRN: [id number] DOB: 07/04/1966 Today's Date: 06/10/2015    History of present illness 49 y.o. 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 IRRIGATION AND DEBRIDEMENT LEFT LEG, WITH CLOSURE OF BKA 7/9.   OT comments  Pt overall still needing min assist for sit to stand and dynamic standing balance during toileting and toilet transfers.  She can maintain static standing with min guard assist, even being able to release one UE at a time without LOB as well.  Limited endurance and strength for mobility with pt only tolerating ambulation from the bedside chair to the 3:1 in the bathroom before needing rest.  Noted pt with 2 episodes of right knee buckling after hopping forward as well.  Pts significant other present for session and interpreter utilized via phone.   Follow Up Recommendations  CIR    Equipment Recommendations  Other (comment) (TBD next venue of care)    Recommendations for Other Services Rehab consult    Precautions / Restrictions Precautions Precautions: Fall Restrictions Weight Bearing Restrictions: Yes LLE Weight Bearing: Non weight bearing       Mobility Bed Mobility                  Transfers       Sit to Stand: Min assist Stand pivot transfers: Min assist       General transfer comment: Pt needs mod instructional cueing for hand placement with sit to stand.     Balance     Sitting balance-Leahy Scale: Fair       Standing balance-Leahy Scale: Poor Standing balance comment: Needs use of the RW for support when standing.                   ADL Overall ADL's : Needs assistance/impaired                         Toilet Transfer: Minimal assistance;RW;Ambulation;BSC   Toileting- Clothing Manipulation and Hygiene:  Minimal assistance;Sit to/from stand       Functional mobility during ADLs: Minimal assistance General ADL Comments: Pt currently min assist level for transfers and for dynamic standing balance with use of the RW.  She was able to stand and alternate reaching for cup on her bedside table with min guard assist for balance.  Demonstrates limited endurance for standing tasks and with functional mobiilty.                  Cognition   Behavior During Therapy: WFL for tasks assessed/performed                                      Pertinent Vitals/ Pain       Pain Assessment: No/denies pain         Frequency Min 2X/week     Progress Toward Goals  OT Goals(current goals can now be found in the care plan section)  Progress towards OT goals: Goals met and updated - see care plan  Acute Rehab OT Goals Time For Goal Achievement: 06/17/15 Potential to Achieve Goals: Good  Plan         End of Session Equipment Utilized During Treatment: Gait belt;Rolling walker   Activity Tolerance Patient tolerated treatment well   

## 2015-06-10 NOTE — Progress Notes (Signed)
Patient ID: [name], female   DOB: July 25, 1966, 49 y.o.   MRN: [id number]         [facility]    Date of Admission:  06/01/2015    Total days of antibiotics 10        Day 6 cefazolin         Principal Problem:   Staphylococcus aureus bacteremia with sepsis Active Problems:   Gangrene of foot   Septic shock   Diabetic ulcer of left foot associated with type 1 diabetes mellitus   Respiratory failure   Severe sepsis   Anemia of chronic disease   Diabetes mellitus type 2, controlled   . antiseptic oral rinse  7 mL Mouth Rinse BID  .  ceFAZolin (ANCEF) IV  2 g Intravenous 3 times per day  . feeding supplement (PRO-STAT SUGAR FREE 64)  30 mL Oral BID  . insulin aspart  0-15 Units Subcutaneous TID WC  . insulin aspart  0-5 Units Subcutaneous QHS  . insulin glargine  5 Units Subcutaneous QHS  . multivitamin with minerals  1 tablet Oral Daily    Past Medical History  Diagnosis Date  . Diabetes mellitus   . 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 Janie Morning), Cysto performed by Dr. Jonna Munro 12/2009 with evac of clots and bx and fulguration. // H/O stage 2 SCC of the vulva  . Anemia     2/2 blood loss  . Diabetes mellitus type 2, uncontrolled DX: 2001  . 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 Diabetic foot ulcers   . Cataract   . Diabetes mellitus without complication     History  Substance Use Topics  . Smoking status: Never Smoker   . Smokeless tobacco: Never Used  . Alcohol Use: No    Family History  Problem Relation Age of Onset  . Diabetes Mother   . Diabetes Father   . Diabetes Brother    No Known Allergies  OBJECTIVE: Blood pressure 91/50, pulse 84, temperature 98.6 F (37 C), temperature source  Oral, resp. rate 16, height 4\' 11"  (1.499 m), weight 115 lb 11.9 oz (52.5 kg), last menstrual period 05/12/2013, SpO2 92 %.   Lab Results Lab Results  Component Value Date   WBC 10.9* 06/09/2015   HGB 8.7* 06/09/2015   HCT 26.2* 06/09/2015   MCV 88.8 06/09/2015   PLT 432* 06/09/2015    Lab Results  Component Value Date   CREATININE 0.52 06/09/2015   BUN <5* 06/09/2015   NA 135 06/09/2015   K 3.6 06/09/2015   CL 98* 06/09/2015   CO2 29 06/09/2015    Lab Results  Component Value Date   ALT 15 06/09/2015   AST 31 06/09/2015   ALKPHOS 89 06/09/2015   BILITOT 0.3 06/09/2015     Microbiology: Recent Results (from the past 240 hour(s))  Blood Culture (routine x 2)     Status: None   Collection Time: 06/01/15  9:29 PM  Result Value Ref Range Status   Specimen Description BLOOD RIGHT ANTECUBITAL  Final   Special Requests BOTTLES DRAWN AEROBIC AND ANAEROBIC 5CC  Final   Culture  Setup Time   Final    GRAM POSITIVE COCCI IN CLUSTERS CRITICAL  RESULT CALLED TO, READ BACK BY AND VERIFIED WITH: Talbert Cage [id number] IN BOTH AEROBIC AND ANAEROBIC BOTTLES    Culture   Final    STAPHYLOCOCCUS AUREUS VIRIDANS STREPTOCOCCUS Performed at [facility]    Report Status 06/08/2015 FINAL  Final   Organism ID, Bacteria STAPHYLOCOCCUS AUREUS  Final   Organism ID, Bacteria VIRIDANS STREPTOCOCCUS  Final      Susceptibility   Staphylococcus aureus - MIC*    CIPROFLOXACIN <=0.5 SENSITIVE Sensitive     ERYTHROMYCIN >=8 RESISTANT Resistant     GENTAMICIN <=0.5 SENSITIVE Sensitive     OXACILLIN <=0.25 SENSITIVE Sensitive     TETRACYCLINE >=16 RESISTANT Resistant     VANCOMYCIN <=0.5 SENSITIVE Sensitive     TRIMETH/SULFA <=10 SENSITIVE Sensitive     CLINDAMYCIN <=0.25 RESISTANT Resistant     RIFAMPIN <=0.5 SENSITIVE Sensitive     Inducible Clindamycin POSITIVE Resistant     * STAPHYLOCOCCUS AUREUS   Viridans streptococcus - MIC*    LEVOFLOXACIN 1 SENSITIVE Sensitive      VANCOMYCIN 0.5 SENSITIVE Sensitive     * VIRIDANS STREPTOCOCCUS  Blood Culture (routine x 2)     Status: None   Collection Time: 06/01/15  9:29 PM  Result Value Ref Range Status   Specimen Description BLOOD LEFT ANTECUBITAL  Final   Special Requests BOTTLES DRAWN AEROBIC AND ANAEROBIC 5CC  Final   Culture  Setup Time   Final    GRAM POSITIVE COCCI IN CLUSTERS IN BOTH AEROBIC AND ANAEROBIC BOTTLES CRITICAL RESULT CALLED TO, READ BACK BY AND VERIFIED WITH: S DENNY,RN [id number] CONFIRMED BY K WILDER    Culture   Final    STAPHYLOCOCCUS AUREUS SUSCEPTIBILITIES PERFORMED ON PREVIOUS CULTURE WITHIN THE LAST 5 DAYS. Performed at [facility]    Report Status 06/08/2015 FINAL  Final  Urine culture     Status: None   Collection Time: 06/01/15  9:44 PM  Result Value Ref Range Status   Specimen Description URINE, CATHETERIZED  Final   Special Requests NONE  Final   Culture   Final    >=100,000 COLONIES/mL ESCHERICHIA COLI Performed at [facility]    Report Status 06/04/2015 FINAL  Final   Organism ID, Bacteria ESCHERICHIA COLI  Final      Susceptibility   Escherichia coli - MIC*    AMPICILLIN >=32 RESISTANT Resistant     CEFAZOLIN <=4 SENSITIVE Sensitive     CEFTRIAXONE <=1 SENSITIVE Sensitive     CIPROFLOXACIN <=0.25 SENSITIVE Sensitive     GENTAMICIN <=1 SENSITIVE Sensitive     IMIPENEM <=0.25 SENSITIVE Sensitive     NITROFURANTOIN <=16 SENSITIVE Sensitive     TRIMETH/SULFA <=20 SENSITIVE Sensitive     AMPICILLIN/SULBACTAM 16 INTERMEDIATE Intermediate     PIP/TAZO <=4 SENSITIVE Sensitive     * >=100,000 COLONIES/mL ESCHERICHIA COLI  Gram stain     Status: None   Collection Time: 06/02/15  1:30 AM  Result Value Ref Range Status   Specimen Description ABSCESS L FOOT  Final   Special Requests NONE  Final   Gram Stain   Final    ABUNDANT GRAM POSITIVE COCCI IN PAIRS FEW WBC PRESENT, PREDOMINANTLY PMN Gram Stain Report Called to,Read Back By and Verified  With: Dollene Cleveland RN @ 0923 ON 06/02/15 BY C DAVIS    Report Status 06/02/2015 FINAL  Final  Culture, routine-abscess     Status: None   Collection Time: 06/02/15  1:30 AM  Result Value Ref Range Status   Specimen Description ABSCESS L FOOT A  Final   Special Requests NONE  Final   Gram Stain   Final    FEW WBC PRESENT, PREDOMINANTLY PMN ABUNDANT GRAM POSITIVE COCCI IN PAIRS Gram Stain Report Called to,Read Back By and Verified With: Gram Stain Report Called to,Read Back By and Verified With: Dollene Cleveland RN @ 4196 ON 06/02/15 BY C DAVIS Performed by [facility] Performed at [facility]    Culture   Final    MODERATE STAPHYLOCOCCUS AUREUS Note: RIFAMPIN AND GENTAMICIN SHOULD NOT BE USED AS SINGLE DRUGS FOR TREATMENT OF STAPH INFECTIONS. This organism is presumed to be Clindamycin resistant based on detection of inducible Clindamycin resistance. Performed at [facility]    Report Status 06/06/2015 FINAL  Final   Organism ID, Bacteria STAPHYLOCOCCUS AUREUS  Final      Susceptibility   Staphylococcus aureus - MIC*    CLINDAMYCIN RESISTANT      ERYTHROMYCIN >=8 RESISTANT Resistant     GENTAMICIN <=0.5 SENSITIVE Sensitive     LEVOFLOXACIN 0.25 SENSITIVE Sensitive     OXACILLIN 0.5 SENSITIVE Sensitive     PENICILLIN >=0.5 RESISTANT Resistant     RIFAMPIN <=0.5 SENSITIVE Sensitive     TRIMETH/SULFA <=10 SENSITIVE Sensitive     VANCOMYCIN 1 SENSITIVE Sensitive     TETRACYCLINE >=16 RESISTANT Resistant     MOXIFLOXACIN <=0.25 SENSITIVE Sensitive     * MODERATE STAPHYLOCOCCUS AUREUS  Culture, routine-abscess     Status: None   Collection Time: 06/02/15  1:30 AM  Result Value Ref Range Status   Specimen Description ABSCESS L FOOT B  Final   Special Requests NONE  Final   Gram Stain   Final    FEW WBC PRESENT,BOTH PMN AND MONONUCLEAR NO SQUAMOUS EPITHELIAL CELLS SEEN ABUNDANT GRAM NEGATIVE RODS MODERATE GRAM POSITIVE COCCI IN PAIRS IN CLUSTERS    Culture   Final      MODERATE STAPHYLOCOCCUS AUREUS Note: RIFAMPIN AND GENTAMICIN SHOULD NOT BE USED AS SINGLE DRUGS FOR TREATMENT OF STAPH INFECTIONS. This organism is presumed to be Clindamycin resistant based on detection of inducible Clindamycin resistance. Performed at [facility]    Report Status 06/06/2015 FINAL  Final   Organism ID, Bacteria STAPHYLOCOCCUS AUREUS  Final      Susceptibility   Staphylococcus aureus - MIC*    CLINDAMYCIN RESISTANT      ERYTHROMYCIN >=8 RESISTANT Resistant     GENTAMICIN <=0.5 SENSITIVE Sensitive     LEVOFLOXACIN 0.25 SENSITIVE Sensitive     OXACILLIN 0.5 SENSITIVE Sensitive     PENICILLIN >=0.5 RESISTANT Resistant     RIFAMPIN <=0.5 SENSITIVE Sensitive     TRIMETH/SULFA <=10 SENSITIVE Sensitive     VANCOMYCIN <=0.5 SENSITIVE Sensitive     TETRACYCLINE >=16 RESISTANT Resistant     MOXIFLOXACIN <=0.25 SENSITIVE Sensitive     * MODERATE STAPHYLOCOCCUS AUREUS  Anaerobic culture     Status: None   Collection Time: 06/02/15  1:30 AM  Result Value Ref Range Status   Specimen Description ABSCESS L FOOT A  Final   Special Requests NONE  Final   Gram Stain   Final    MODERATE WBC PRESENT,BOTH PMN AND MONONUCLEAR NO SQUAMOUS EPITHELIAL CELLS SEEN ABUNDANT GRAM NEGATIVE RODS ABUNDANT GRAM POSITIVE COCCI IN PAIRS IN CLUSTERS    Culture   Final    NO ANAEROBES ISOLATED Performed at [facility]    Report Status  06/07/2015 FINAL  Final  Anaerobic culture     Status: None   Collection Time: 06/02/15  1:30 AM  Result Value Ref Range Status   Specimen Description ABSCESS L FOOT B  Final   Special Requests NONE  Final   Gram Stain   Final    MODERATE WBC PRESENT,BOTH PMN AND MONONUCLEAR NO SQUAMOUS EPITHELIAL CELLS SEEN ABUNDANT GRAM NEGATIVE RODS ABUNDANT GRAM POSITIVE COCCI IN PAIRS IN CLUSTERS    Culture   Final    NO ANAEROBES ISOLATED Performed at [facility]    Report Status 06/07/2015 FINAL  Final  MRSA PCR Screening      Status: None   Collection Time: 06/02/15  3:19 AM  Result Value Ref Range Status   MRSA by PCR NEGATIVE NEGATIVE Final    Comment:        The GeneXpert MRSA Assay (FDA approved for NASAL specimens only), is one component of a comprehensive MRSA colonization surveillance program. It is not intended to diagnose MRSA infection nor to guide or monitor treatment for MRSA infections.   Culture, blood (routine x 2)     Status: None (Preliminary result)   Collection Time: 06/05/15  3:05 PM  Result Value Ref Range Status   Specimen Description BLOOD RIGHT HAND  Final   Special Requests BOTTLES DRAWN AEROBIC ONLY 10CC  Final   Culture NO GROWTH 4 DAYS  Final   Report Status PENDING  Incomplete  Culture, blood (routine x 2)     Status: None (Preliminary result)   Collection Time: 06/05/15  6:09 PM  Result Value Ref Range Status   Specimen Description BLOOD RIGHT HAND  Final   Special Requests BOTTLES DRAWN AEROBIC AND ANAEROBIC 10CC  Final   Culture NO GROWTH 4 DAYS  Final   Report Status PENDING  Incomplete    Assessment: She is improving on therapy for MSSA bacteremia after left BKA for gangrenous infection. She underwent I&D of her BKA stump 2 days ago. Repeat blood cultures are negative. I recommend TEE to help determine optimal duration of antibiotic therapy.  Plan: 1. Continue cefazolin 2. Recommend TEE  Michel Bickers, MD [facility] [phone] pager   [phone] cell 06/10/2015, 1:25 PM

## 2015-06-10 NOTE — Progress Notes (Signed)
PROGRESS NOTE  [name] ZOX:[id number] DOB: 1965-12-21 DOA: 06/01/2015 PCP: Doris Cheadle, MD  HPI/Recap of past 24 hours:  Denies pain  Assessment/Plan: Principal Problem:   Staphylococcus aureus bacteremia with sepsis Active Problems:   Septic shock   Gangrene of foot   Diabetic ulcer of left foot associated with type 1 diabetes mellitus   Respiratory failure   Severe sepsis   Anemia of chronic disease   Diabetes mellitus type 2, controlled  #1. 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 Cardiology consulted for TEE.  #2. Diabetes mellitus type 2 - a1c 11.7 ( 05/2015), adjust insulin prn  #3. Anemia - stable  #4 Candida Vulvovaginitis - Completed one day of Diflucan on 7/4. Currently on Clotrimazole vaginal cream.  #5 h/o stage IV vaginal cancer,  defer to outpatient oncology  Code Status: full  Family Communication: patient  Disposition Plan: cir when medically stable   Consultants:  Cardiology for TEE  Infectious disease  CIR  Orthopedics  PCCM ( intensive care)  Procedures:  Intubation 7/3 and extubation 7/7  Left [place] CVL from 7/3 7/03 Lt [place] amputation 7/04 Lt leg amputation through tibia, fibula 7/06 Return to OR for further debridement 7/9  Return to OR for further debridement and closure of left BKA    Antibiotics: Clindamycin 7/3>>7/5 Zosyn 7/2>>7/5 Vancomycin 7/2>>7/6 CTX 7/5>> Clotrimazole vaginal cream, Completed Diflucan x1 7/4   Objective: BP 93/48 mmHg  Pulse 65  Temp(Src) 98.6 F (37 C) (Oral)  Resp 16  Ht 4\' 11"  (1.499 m)  Wt 52.5 kg (115 lb 11.9 oz)  BMI 23.36 kg/m2  SpO2 94%  LMP 05/12/2013  Intake/Output Summary (Last 24 hours) at 06/10/15 1749 Last data filed at 06/10/15 1542  Gross per 24 hour  Intake    480 ml  Output   1050 ml  Net   -570  ml   Filed Weights   06/03/15 0615 06/04/15 0500 06/06/15 0500  Weight: 59.2 kg (130 lb 8.2 oz) 54.4 kg (119 lb 14.9 oz) 52.5 kg (115 lb 11.9 oz)    Exam:   General:  NAD, sitting in chair  Cardiovascular: RRR  Respiratory: CTABL  Abdomen: Soft/ND/NT, positive BS  Musculoskeletal: No Edema, s/p left BKA, dressing/ace wrap in place  Neuro: aox3  Data Reviewed: Basic Metabolic Panel:  Recent Labs Lab 06/04/15 0357 06/05/15 0440 06/06/15 0509 06/07/15 0402 06/08/15 0322 06/09/15 2025  NA 151* 145 141 136 136 135  K 3.4* 3.3* 3.1* 4.0 3.8 3.6  CL 114* 109 102 101 100* 98*  CO2 31 31 31 29 30 29   GLUCOSE 178* 141* 116* 274* 294* 180*  BUN 9 9 <5* 6 9 <5*  CREATININE 0.67 0.59 0.47 0.61 0.57 0.52  CALCIUM 7.2* 7.4* 7.5* 7.4* 7.6* 8.2*  MG 1.9  --   --   --  2.0  --   PHOS  --   --   --   --  3.3  --    Liver Function Tests:  Recent Labs Lab 06/09/15 2025  AST 31  ALT 15  ALKPHOS 89  BILITOT 0.3  PROT 6.7  ALBUMIN 2.0*   No results for input(s): LIPASE, AMYLASE in the last 168 hours. No results for input(s): AMMONIA in the last 168 hours. CBC:  Recent Labs Lab 06/04/15 0357 06/06/15 0509 06/08/15 0322 06/09/15 2025  WBC 12.1*  10.6* 7.2 10.9*  NEUTROABS  --   --   --  8.4*  HGB 8.4* 8.4* 8.0* 8.7*  HCT 24.1* 25.2* 24.2* 26.2*  MCV 83.4 87.8 87.4 88.8  PLT 366 366 374 432*   Cardiac Enzymes:   No results for input(s): CKTOTAL, CKMB, CKMBINDEX, TROPONINI in the last 168 hours. BNP (last 3 results)  Recent Labs  06/02/15 0415  BNP 194.6*    ProBNP (last 3 results) No results for input(s): PROBNP in the last 8760 hours.  CBG:  Recent Labs Lab 06/09/15 2157 06/10/15 0011 06/10/15 0753 06/10/15 1139 06/10/15 1657  GLUCAP 221* 200* 189* 147* 213*    Recent Results (from the past 240 hour(s))  Blood Culture (routine x 2)     Status: None   Collection Time: 06/01/15  9:29 PM  Result Value Ref Range Status   Specimen Description  BLOOD RIGHT ANTECUBITAL  Final   Special Requests BOTTLES DRAWN AEROBIC AND ANAEROBIC 5CC  Final   Culture  Setup Time   Final    GRAM POSITIVE COCCI IN CLUSTERS CRITICAL RESULT CALLED TO, READ BACK BY AND VERIFIED WITH: Cristobal Goldmann [id number] WILDERK IN BOTH AEROBIC AND ANAEROBIC BOTTLES    Culture   Final    STAPHYLOCOCCUS AUREUS VIRIDANS STREPTOCOCCUS Performed at [facility]    Report Status 06/10/2015 FINAL  Final   Organism ID, Bacteria STAPHYLOCOCCUS AUREUS  Final   Organism ID, Bacteria VIRIDANS STREPTOCOCCUS  Final      Susceptibility   Staphylococcus aureus - MIC*    CIPROFLOXACIN <=0.5 SENSITIVE Sensitive     ERYTHROMYCIN >=8 RESISTANT Resistant     GENTAMICIN <=0.5 SENSITIVE Sensitive     OXACILLIN <=0.25 SENSITIVE Sensitive     TETRACYCLINE >=16 RESISTANT Resistant     VANCOMYCIN <=0.5 SENSITIVE Sensitive     TRIMETH/SULFA <=10 SENSITIVE Sensitive     CLINDAMYCIN <=0.25 RESISTANT Resistant     RIFAMPIN <=0.5 SENSITIVE Sensitive     Inducible Clindamycin POSITIVE Resistant     * STAPHYLOCOCCUS AUREUS   Viridans streptococcus - MIC*    PENICILLIN <=0.06 SENSITIVE Sensitive     CEFTRIAXONE 0.25 SENSITIVE Sensitive     LEVOFLOXACIN 1 SENSITIVE Sensitive     VANCOMYCIN 0.5 SENSITIVE Sensitive     * VIRIDANS STREPTOCOCCUS  Blood Culture (routine x 2)     Status: None   Collection Time: 06/01/15  9:29 PM  Result Value Ref Range Status   Specimen Description BLOOD LEFT ANTECUBITAL  Final   Special Requests BOTTLES DRAWN AEROBIC AND ANAEROBIC 5CC  Final   Culture  Setup Time   Final    GRAM POSITIVE COCCI IN CLUSTERS IN BOTH AEROBIC AND ANAEROBIC BOTTLES CRITICAL RESULT CALLED TO, READ BACK BY AND VERIFIED WITH: S DENNY,RN [id number] 2323 WILDERK CONFIRMED BY K WILDER    Culture   Final    STAPHYLOCOCCUS AUREUS SUSCEPTIBILITIES PERFORMED ON PREVIOUS CULTURE WITHIN THE LAST 5 DAYS. Performed at [facility]    Report Status 06/08/2015 FINAL  Final    Urine culture     Status: None   Collection Time: 06/01/15  9:44 PM  Result Value Ref Range Status   Specimen Description URINE, CATHETERIZED  Final   Special Requests NONE  Final   Culture   Final    >=100,000 COLONIES/mL ESCHERICHIA COLI Performed at [facility]    Report Status 06/04/2015 FINAL  Final   Organism ID, Bacteria ESCHERICHIA COLI  Final  Susceptibility   Escherichia coli - MIC*    AMPICILLIN >=32 RESISTANT Resistant     CEFAZOLIN <=4 SENSITIVE Sensitive     CEFTRIAXONE <=1 SENSITIVE Sensitive     CIPROFLOXACIN <=0.25 SENSITIVE Sensitive     GENTAMICIN <=1 SENSITIVE Sensitive     IMIPENEM <=0.25 SENSITIVE Sensitive     NITROFURANTOIN <=16 SENSITIVE Sensitive     TRIMETH/SULFA <=20 SENSITIVE Sensitive     AMPICILLIN/SULBACTAM 16 INTERMEDIATE Intermediate     PIP/TAZO <=4 SENSITIVE Sensitive     * >=100,000 COLONIES/mL ESCHERICHIA COLI  Gram stain     Status: None   Collection Time: 06/02/15  1:30 AM  Result Value Ref Range Status   Specimen Description ABSCESS L FOOT  Final   Special Requests NONE  Final   Gram Stain   Final    ABUNDANT GRAM POSITIVE COCCI IN PAIRS FEW WBC PRESENT, PREDOMINANTLY PMN Gram Stain Report Called to,Read Back By and Verified With: Claris Gladden RN @ 0327 ON 06/02/15 BY C DAVIS    Report Status 06/02/2015 FINAL  Final  Culture, routine-abscess     Status: None   Collection Time: 06/02/15  1:30 AM  Result Value Ref Range Status   Specimen Description ABSCESS L FOOT A  Final   Special Requests NONE  Final   Gram Stain   Final    FEW WBC PRESENT, PREDOMINANTLY PMN ABUNDANT GRAM POSITIVE COCCI IN PAIRS Gram Stain Report Called to,Read Back By and Verified With: Gram Stain Report Called to,Read Back By and Verified With: Claris Gladden RN @ 0327 ON 06/02/15 BY C DAVIS Performed by [facility] Performed at [facility]    Culture   Final    MODERATE STAPHYLOCOCCUS AUREUS Note: RIFAMPIN AND GENTAMICIN SHOULD NOT BE  USED AS SINGLE DRUGS FOR TREATMENT OF STAPH INFECTIONS. This organism is presumed to be Clindamycin resistant based on detection of inducible Clindamycin resistance. Performed at [facility]    Report Status 06/06/2015 FINAL  Final   Organism ID, Bacteria STAPHYLOCOCCUS AUREUS  Final      Susceptibility   Staphylococcus aureus - MIC*    CLINDAMYCIN RESISTANT      ERYTHROMYCIN >=8 RESISTANT Resistant     GENTAMICIN <=0.5 SENSITIVE Sensitive     LEVOFLOXACIN 0.25 SENSITIVE Sensitive     OXACILLIN 0.5 SENSITIVE Sensitive     PENICILLIN >=0.5 RESISTANT Resistant     RIFAMPIN <=0.5 SENSITIVE Sensitive     TRIMETH/SULFA <=10 SENSITIVE Sensitive     VANCOMYCIN 1 SENSITIVE Sensitive     TETRACYCLINE >=16 RESISTANT Resistant     MOXIFLOXACIN <=0.25 SENSITIVE Sensitive     * MODERATE STAPHYLOCOCCUS AUREUS  Culture, routine-abscess     Status: None   Collection Time: 06/02/15  1:30 AM  Result Value Ref Range Status   Specimen Description ABSCESS L FOOT B  Final   Special Requests NONE  Final   Gram Stain   Final    FEW WBC PRESENT,BOTH PMN AND MONONUCLEAR NO SQUAMOUS EPITHELIAL CELLS SEEN ABUNDANT GRAM NEGATIVE RODS MODERATE GRAM POSITIVE COCCI IN PAIRS IN CLUSTERS    Culture   Final    MODERATE STAPHYLOCOCCUS AUREUS Note: RIFAMPIN AND GENTAMICIN SHOULD NOT BE USED AS SINGLE DRUGS FOR TREATMENT OF STAPH INFECTIONS. This organism is presumed to be Clindamycin resistant based on detection of inducible Clindamycin resistance. Performed at [facility]    Report Status 06/06/2015 FINAL  Final   Organism ID, Bacteria STAPHYLOCOCCUS AUREUS  Final  Susceptibility   Staphylococcus aureus - MIC*    CLINDAMYCIN RESISTANT      ERYTHROMYCIN >=8 RESISTANT Resistant     GENTAMICIN <=0.5 SENSITIVE Sensitive     LEVOFLOXACIN 0.25 SENSITIVE Sensitive     OXACILLIN 0.5 SENSITIVE Sensitive     PENICILLIN >=0.5 RESISTANT Resistant     RIFAMPIN <=0.5 SENSITIVE Sensitive      TRIMETH/SULFA <=10 SENSITIVE Sensitive     VANCOMYCIN <=0.5 SENSITIVE Sensitive     TETRACYCLINE >=16 RESISTANT Resistant     MOXIFLOXACIN <=0.25 SENSITIVE Sensitive     * MODERATE STAPHYLOCOCCUS AUREUS  Anaerobic culture     Status: None   Collection Time: 06/02/15  1:30 AM  Result Value Ref Range Status   Specimen Description ABSCESS L FOOT A  Final   Special Requests NONE  Final   Gram Stain   Final    MODERATE WBC PRESENT,BOTH PMN AND MONONUCLEAR NO SQUAMOUS EPITHELIAL CELLS SEEN ABUNDANT GRAM NEGATIVE RODS ABUNDANT GRAM POSITIVE COCCI IN PAIRS IN CLUSTERS    Culture   Final    NO ANAEROBES ISOLATED Performed at [facility]    Report Status 06/07/2015 FINAL  Final  Anaerobic culture     Status: None   Collection Time: 06/02/15  1:30 AM  Result Value Ref Range Status   Specimen Description ABSCESS L FOOT B  Final   Special Requests NONE  Final   Gram Stain   Final    MODERATE WBC PRESENT,BOTH PMN AND MONONUCLEAR NO SQUAMOUS EPITHELIAL CELLS SEEN ABUNDANT GRAM NEGATIVE RODS ABUNDANT GRAM POSITIVE COCCI IN PAIRS IN CLUSTERS    Culture   Final    NO ANAEROBES ISOLATED Performed at [facility]    Report Status 06/07/2015 FINAL  Final  MRSA PCR Screening     Status: None   Collection Time: 06/02/15  3:19 AM  Result Value Ref Range Status   MRSA by PCR NEGATIVE NEGATIVE Final    Comment:        The GeneXpert MRSA Assay (FDA approved for NASAL specimens only), is one component of a comprehensive MRSA colonization surveillance program. It is not intended to diagnose MRSA infection nor to guide or monitor treatment for MRSA infections.   Culture, blood (routine x 2)     Status: None   Collection Time: 06/05/15  3:05 PM  Result Value Ref Range Status   Specimen Description BLOOD RIGHT HAND  Final   Special Requests BOTTLES DRAWN AEROBIC ONLY 10CC  Final   Culture NO GROWTH 5 DAYS  Final   Report Status 06/10/2015 FINAL  Final  Culture, blood  (routine x 2)     Status: None   Collection Time: 06/05/15  6:09 PM  Result Value Ref Range Status   Specimen Description BLOOD RIGHT HAND  Final   Special Requests BOTTLES DRAWN AEROBIC AND ANAEROBIC 10CC  Final   Culture NO GROWTH 5 DAYS  Final   Report Status 06/10/2015 FINAL  Final     Studies: No results found.  Scheduled Meds: . antiseptic oral rinse  7 mL Mouth Rinse BID  .  ceFAZolin (ANCEF) IV  2 g Intravenous 3 times per day  . feeding supplement (PRO-STAT SUGAR FREE 64)  30 mL Oral BID  . insulin aspart  0-15 Units Subcutaneous TID WC  . insulin aspart  0-5 Units Subcutaneous QHS  . insulin glargine  5 Units Subcutaneous QHS  . multivitamin with minerals  1 tablet Oral Daily  Continuous Infusions:    Time spent:  Sherlynn Tourville MD, PhD  [facility] Pager [phone]. If 7PM-7AM, please contact night-coverage at [email or web address], password [facility] 06/10/2015, 5:49 PM  LOS: 9 days

## 2015-06-10 NOTE — Progress Notes (Signed)
Inpatient Diabetes Program Recommendations  AACE/ADA: New Consensus Statement on Inpatient Glycemic Control (2013)  Target Ranges:  Prepandial:   less than 140 mg/dL      Peak postprandial:   less than 180 mg/dL (1-2 hours)      Critically ill patients:  140 - 180 mg/dL    Results for ABRIL, CAPPIELLO (MRN [id number]) as of 06/10/2015 11:21  Ref. Range 06/09/2015 07:31 06/09/2015 11:56 06/09/2015 17:08 06/09/2015 21:57  Glucose-Capillary Latest Ref Range: 65-99 mg/dL 205 (H) 173 (H) 142 (H) 221 (H)    Results for KORA, GROOM (MRN [id number]) as of 06/10/2015 11:21  Ref. Range 06/10/2015 07:53  Glucose-Capillary Latest Ref Range: 65-99 mg/dL 189 (H)    Home DM Meds: Lantus 16 units QHS       Novolog 18 units QID  Current DM Orders: Lantus 5 units QHS            Novolog Moderate SSI (0-15 units) TID AC + HS    -PO Intake 45-100% of meals.  -Fasting glucose elevated this AM.   MD- Please consider the following in-hospital insulin adjustments:  1. Increase Lantus to 10 units QHS  2. Add low dose Novolog Meal Coverage- Novolog 3 units tidwc     Will follow Wyn Quaker RN, MSN, CDE Diabetes Coordinator Inpatient Glycemic Control Team Team Pager: [phone] (8a-5p)

## 2015-06-10 NOTE — Progress Notes (Signed)
Physical Therapy Treatment Patient Details Name: [name] MRN: [id number] DOB: [birth year] Today's Date: 06/10/2015    History of Present Illness 49 y.o. 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 IRRIGATION AND DEBRIDEMENT LEFT LEG, WITH CLOSURE OF BKA 7/9.    PT Comments    Patient progressing well towards PT goals. Improving ambulation distance but continues to fatigue requiring frequent rest breaks. Reviewed exercises, which pt reports she has already done once this morning. Motivated. 1 LOB when turning requiring Min A to prevent fall. Will continue to follow per current POC. Appropriate for CIR.    Follow Up Recommendations  CIR     Equipment Recommendations  Other (comment) (TBD)    Recommendations for Other Services       Precautions / Restrictions Precautions Precautions: Fall Restrictions Weight Bearing Restrictions: Yes LLE Weight Bearing: Non weight bearing    Mobility  Bed Mobility Overal bed mobility: Needs Assistance Bed Mobility: Supine to Sit     Supine to sit: Min guard;HOB elevated     General bed mobility comments: Close guard for safety. Requires extra time. Able to perform without physical assist.  Transfers Overall transfer level: Needs assistance Equipment used: Rolling walker (2 wheeled) Transfers: Sit to/from Stand Sit to Stand: Min assist;Min guard         General transfer comment: Min assist for boost to stand from lowest bed setting. Min A progressing to min guard after a few repetitions and cues for hand placement. Verbal/manual cues for hand placement. Stood from [employer], from chair x2.   Ambulation/Gait Ambulation/Gait assistance: Min assist Ambulation Distance (Feet): 10 Feet (x3 bouts) Assistive device: Rolling walker (2 wheeled) Gait Pattern/deviations:  ("hop to" gait  pattern.) Gait velocity: decreased   General Gait Details: 1 LOB when trying to turn with therapist providing Min A to prevent fall. Able to take small steady hops. 2 seated rest breaks.   Stairs            Wheelchair Mobility    Modified Rankin (Stroke Patients Only)       Balance Overall balance assessment: Needs assistance Sitting-balance support: Feet supported;No upper extremity supported Sitting balance-Leahy Scale: Fair     Standing balance support: During functional activity Standing balance-Leahy Scale: Poor Standing balance comment: Relient on RW for support.                     Cognition Arousal/Alertness: Awake/alert Behavior During Therapy: WFL for tasks assessed/performed Overall Cognitive Status: Difficult to assess (but appears WFL.)                      Exercises General Exercises - Lower Extremity Quad Sets: Left;10 reps;Seated Long Arc Quad: Left;10 reps;Seated Straight Leg Raises: Left;10 reps;Seated Hip Flexion/Marching: Left;10 reps;Seated    General Comments        Pertinent Vitals/Pain Pain Assessment: Faces Faces Pain Scale: Hurts a little bit Pain Location: left residual limb Pain Descriptors / Indicators: Grimacing Pain Intervention(s): Monitored during session;Repositioned    Home Living                      Prior Function            PT Goals (current goals can now be found in the care plan section) Progress towards PT goals: Progressing toward goals    Frequency  Min 5X/week    PT Plan Current plan remains appropriate    Co-evaluation             End of Session Equipment Utilized During Treatment: Gait belt Activity Tolerance: Patient tolerated treatment well Patient left: in chair;with call bell/phone within reach     Time: 0953-1007 PT Time Calculation (min) (ACUTE ONLY): 14 min  Charges:  $Therapeutic Activity: 8-22 mins                    G Codes:      Zavior Thomason A  Khai Arrona 06/10/2015, 10:16 AM Mylo Red, PT, DPT [phone]

## 2015-06-11 ENCOUNTER — Encounter (HOSPITAL_COMMUNITY)
Admission: EM | Disposition: A | Discharge status: Rehabilitation Facility | Payer: Self-pay | Source: Home / Self Care | Attending: Internal Medicine

## 2015-06-11 ENCOUNTER — Inpatient Hospital Stay (HOSPITAL_COMMUNITY): Payer: Medicaid Other

## 2015-06-11 ENCOUNTER — Encounter (HOSPITAL_COMMUNITY): Payer: Self-pay | Admitting: *Deleted

## 2015-06-11 DIAGNOSIS — R7881 Bacteremia: Secondary | ICD-10-CM

## 2015-06-11 DIAGNOSIS — Z89512 Acquired absence of left leg below knee: Secondary | ICD-10-CM

## 2015-06-11 DIAGNOSIS — A4101 Sepsis due to Methicillin susceptible Staphylococcus aureus: Principal | ICD-10-CM

## 2015-06-11 HISTORY — PX: TEE WITHOUT CARDIOVERSION: SHX5443

## 2015-06-11 LAB — GLUCOSE, CAPILLARY
Glucose-Capillary: 207 mg/dL — ABNORMAL HIGH (ref 65–99)
Glucose-Capillary: 130 mg/dL — ABNORMAL HIGH (ref 65–99)
Glucose-Capillary: 134 mg/dL — ABNORMAL HIGH (ref 65–99)
Glucose-Capillary: 176 mg/dL — ABNORMAL HIGH (ref 65–99)

## 2015-06-11 SURGERY — ECHOCARDIOGRAM, TRANSESOPHAGEAL
Anesthesia: Moderate Sedation

## 2015-06-11 MED ORDER — BUTAMBEN-TETRACAINE-BENZOCAINE 2-2-14 % EX AERO
INHALATION_SPRAY | CUTANEOUS | Status: DC | PRN
  Administered 2015-06-11: 2 via TOPICAL

## 2015-06-11 MED ORDER — FENTANYL CITRATE (PF) 100 MCG/2ML IJ SOLN
INTRAMUSCULAR | Status: DC | PRN
  Administered 2015-06-11 (×2): 25 ug via INTRAVENOUS

## 2015-06-11 MED ORDER — MIDAZOLAM HCL 10 MG/2ML IJ SOLN
INTRAMUSCULAR | Status: DC | PRN
  Administered 2015-06-11: 2 mg via INTRAVENOUS

## 2015-06-11 MED ORDER — DIPHENHYDRAMINE HCL 50 MG/ML IJ SOLN
INTRAMUSCULAR | Status: AC
  Filled 2015-06-11: qty 1

## 2015-06-11 MED ORDER — MIDAZOLAM HCL 5 MG/ML IJ SOLN
INTRAMUSCULAR | Status: AC
  Filled 2015-06-11: qty 2

## 2015-06-11 MED ORDER — FENTANYL CITRATE (PF) 100 MCG/2ML IJ SOLN
INTRAMUSCULAR | Status: AC
  Filled 2015-06-11: qty 2

## 2015-06-11 MED ORDER — SODIUM CHLORIDE 0.9 % IV SOLN: INTRAVENOUS | Status: DC

## 2015-06-11 NOTE — Progress Notes (Signed)
Nutrition Follow-up  DOCUMENTATION CODES:  Not applicable  INTERVENTION:  -Continue MVI -Continue 30 ml Prostat bid  NUTRITION DIAGNOSIS:  Inadequate oral intake related to acute illness as evidenced by  (recent surgery, post-op healing).  Progressing  GOAL:  Patient will meet greater than or equal to 90% of their needs  Progressing  MONITOR:  PO intake, Supplement acceptance, Labs, Weight trends, Skin, I & O's  REASON FOR ASSESSMENT:  Consult, Ventilator Enteral/tube feeding initiation and management  ASSESSMENT:  49 yo female with hx of DM presented to ER with Lt foot pain x one week associated with open wound. Found to have septic shock and gangrene of Lt lower extremity.  Pt s/p revision of left BKA and closure on 7/9.  Spoke with charge RN who reports pt is currently NPO for TEE (per ID, to help determine duration of antibiotics therapy).   Reviewed SLP note on 06/10/15. Pt has been advanced to a regular consistency (Carb Modified) diet with thin liquids. Meal completion good, per do flowsheets (PO: 50-100%).   Potential to d/c to CIR once medically stable. Per RN, awaiting formal therapy evaluation.   Diet Order:  Diet NPO time specified  Skin:  Wound (see comment) (closed lt thigh incision)  Last BM:  06/09/15  Height:  Ht Readings from Last 1 Encounters:  06/02/15 4\' 11"  (1.499 m)    Weight:  Wt Readings from Last 1 Encounters:  06/06/15 115 lb 11.9 oz (52.5 kg)    Ideal Body Weight:  44.8 kg  Wt Readings from Last 10 Encounters:  06/06/15 115 lb 11.9 oz (52.5 kg)  03/21/15 133 lb (60.328 kg)  12/20/14 125 lb 6.4 oz (56.881 kg)  01/19/13 139 lb (63.05 kg)  10/20/12 138 lb 4.8 oz (62.732 kg)  10/05/12 136 lb (61.689 kg)  05/17/12 133 lb 4 oz (60.442 kg)  04/10/12 130 lb 4.8 oz (59.104 kg)  03/16/11 123 lb (55.792 kg)  02/19/11 122 lb 6.4 oz (55.52 kg)    BMI:  Body mass index is 23.36 kg/(m^2).  Estimated Nutritional  Needs:  Kcal:  1600-1800  Protein:  70-85 grams  Fluid:  1.6-1.8 L  EDUCATION NEEDS:  No education needs identified at this time  Catalina Salasar A. Jimmye Norman, RD, LDN, CDE Pager: [phone] After hours Pager: [phone]

## 2015-06-11 NOTE — Consult Note (Signed)
Physical Medicine and Rehabilitation Consult   Reason for Consult: Necrotizing fascitis with bacteremia and L-BKA  Referring Physician: Dr. Erlinda Hong.    HPI: [name] is a 49 y.o. female with h/o DM, vaginal cancer, chronic LLE infection who was admitted on 06/02/15 with gangrenous changes with necrotizing fascitis left foot and septic shock.  She was intubated for acute respiratory failure and was started on IV antibiotics for treatment. She underwent left [place] amputation the same day for severe infection followed by BKA on 07/04.  She required multiple I and D on 07/07 and 07/09 with closure of wound and  Placement of antibiotic beads.  Blood cultures were positive for Staph aureus and strep Viridans. Dr. Megan Salon consulted for input and recommended IV cefazolin with TEE to help determine duration of antibiotic regimen. Question repeat I and D tomorrow by Dr. Brayton El. Xu ?  She was extubated without difficulty and therapy initiated. CIR recommended by MD and Rehab team for follow up therapy.   Used interpreter line to facilitate communication: Patient was focused on rectal numbness and abdominal numbness as well as difficulty speaking since extubation.   Review of Systems  Unable to perform ROS: language  Respiratory: Negative for cough and sputum production.   Cardiovascular: Negative for chest pain.  Genitourinary: Negative for urgency and frequency.       NO incontinence reported.    Musculoskeletal: Negative for myalgias and neck pain.  Neurological: Positive for sensory change. Negative for focal weakness.  Psychiatric/Behavioral: The patient is nervous/anxious.      Past Medical History  Diagnosis Date  . Diabetes mellitus   . 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 Janie Morning),  Cysto performed by Dr. Jonna Munro 12/2009 with evac of clots and bx and fulguration. // H/O stage 2 SCC of the vulva  . Anemia     2/2 blood loss  . Diabetes mellitus type 2, uncontrolled DX: 2001  . 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 Diabetic foot ulcers   . Cataract   . Diabetes mellitus without complication     Past Surgical History  Procedure Laterality Date  . Radical wide local excision of the vulva and right nguinal lymph node dissection  10/2003    Dr. Fermin Schwab  . Uterine dilatation and currettage    . Labial mass excision  07/2003    Dr. Ree Edman  . Left second toe mtp joint amputation  12/19/2010    Dr. Mayer Camel  . Irrigation and debridement of left foot with removal of left  01/29/2011    Dr. Mayer Camel  . Tubal ligation    . I&d extremity Left 06/02/2015    Procedure: IRRIGATION AND DEBRIDEMENT EXTREMITY;  Surgeon: Leandrew Koyanagi, MD;  Location: [facility];  Service: Orthopedics;  Laterality: Left;  . Amputation Left 06/02/2015    Procedure: Partial Left  AMPUTATION FOOT;  Surgeon: Leandrew Koyanagi, MD;  Location: [facility];  Service: Orthopedics;  Laterality: Left;  . I&d extremity Left 06/05/2015    Procedure: IRRIGATION AND DEBRIDEMENT LEFT LEG, POSSIBLE CLOSURE BELOW KNEE AMPUTATION;  Surgeon: Leandrew Koyanagi, MD;  Location: [place];  Service: Orthopedics;  Laterality: Left;  . Amputation Left 06/03/2015    Procedure: Left Transtibial amputation,application  of wound vac;  Surgeon: Leandrew Koyanagi, MD;  Location: [facility];  Service: Orthopedics;  Laterality: Left;  . I&d extremity Left 06/08/2015    Procedure: IRRIGATION AND DEBRIDEMENT LEFT LEG, WITH  CLOSURE OF BKA;  Surgeon: Leandrew Koyanagi, MD;  Location: [place];  Service: Orthopedics;  Laterality: Left;     Family History  Problem Relation Age of Onset  . Diabetes Mother   . Diabetes Father   . Diabetes Brother     Social History:  Lives with husband. Independent PTA. Husband works days. She reports  that she has never smoked. She has never used smokeless tobacco. She reports that she does not drink alcohol or use illicit drugs.     Allergies: No Known Allergies    Medications Prior to Admission  Medication Sig Dispense Refill  . insulin aspart (NOVOLOG) 100 UNIT/ML injection Inject 3 Units into the skin 3 (three) times daily before meals. (Patient taking differently: Inject 18 Units into the skin 4 (four) times daily - after meals and at bedtime. ) 10 mL 2  . insulin glargine (LANTUS) 100 UNIT/ML injection Inject 0.24 mLs (24 Units total) into the skin at bedtime. (Patient taking differently: Inject 16 Units into the skin at bedtime. ) 10 mL 2  . atorvastatin (LIPITOR) 20 MG tablet Take 1 tablet (20 mg total) by mouth daily. (Patient not taking: Reported on 03/20/2015) 30 tablet 3  . Blood Glucose Monitoring Suppl (RELION CONFIRM GLUCOSE MONITOR) W/DEVICE KIT 1 kit by Does not apply route 3 (three) times daily. 1 kit 2  . doxycycline (VIBRA-TABS) 100 MG tablet Take 1 tablet (100 mg total) by mouth every 12 (twelve) hours. (Patient not taking: Reported on 06/01/2015) 20 tablet 0  . glucose monitoring kit (FREESTYLE) monitoring kit 1 each by Does not apply route 4 (four) times daily - after meals and at bedtime. 1 month Diabetic Testing Supplies for QAC-QHS accuchecks. (Patient not taking: Reported on 03/20/2015) 1 each 1  . oxyCODONE-acetaminophen (PERCOCET) 5-325 MG per tablet Take 1-2 tablets by mouth every 6 (six) hours as needed. (Patient not taking: Reported on 03/20/2015) 15 tablet 0  . Vitamin D, Ergocalciferol, (DRISDOL) 50000 UNITS CAPS capsule Take 1 capsule (50,000 Units total) by mouth every 7 (seven) days. (Patient not taking: Reported on 03/20/2015) 12 capsule 0    Home: Home Living Family/patient expects to be discharged to:: Private residence Living Arrangements: Spouse/significant other Available Help at Discharge: Family, Available 24 hours/day Type of Home: House Home Access:  Stairs to enter Technical brewer of Steps: 5 Entrance Stairs-Rails: Left Home Layout: One level Bathroom Shower/Tub: Chiropodist: Handicapped height Home Equipment: Bedside commode  Functional History: Prior Function Level of Independence: Independent Functional Status:  Mobility: Bed Mobility Overal bed mobility: Needs Assistance Bed Mobility: Supine to Sit Supine to sit: Min guard, HOB elevated General bed mobility comments: Close guard for safety. Requires extra time. Able to perform without physical assist. Transfers Overall transfer level: Needs assistance Equipment used: Rolling walker (2 wheeled) Transfers: Sit to/from Stand Sit to Stand: Min assist Stand pivot transfers: Min assist General transfer comment: Pt needs mod instructional cueing for hand placement with sit to stand.  Ambulation/Gait Ambulation/Gait assistance: Min assist Ambulation Distance (Feet): 10 Feet (x3 bouts) Assistive device: Rolling walker (2 wheeled) Gait Pattern/deviations:  ("hop to" gait pattern.) General Gait Details: 1 LOB when trying to turn with therapist providing Min A to prevent fall. Able to take small steady hops. 2 seated  rest breaks. Gait velocity: decreased Gait velocity interpretation: <1.8 ft/sec, indicative of risk for recurrent falls    ADL: ADL Overall ADL's : Needs assistance/impaired Grooming: Wash/dry hands, Wash/dry face, Sitting, Set up, Supervision/safety Upper Body Bathing: Supervision/ safety, Set up, Sitting Lower Body Bathing: Maximal assistance Upper Body Dressing : Supervision/safety, Set up, Sitting Lower Body Dressing: Total assistance Toilet Transfer: Minimal assistance, RW, Ambulation, BSC Toilet Transfer Details (indicate cue type and reason): unable due to pain, per PT pt is mod A with SPT Toileting- Clothing Manipulation and Hygiene: Minimal assistance, Sit to/from stand Functional mobility during ADLs: Minimal  assistance General ADL Comments: Pt currently min assist level for transfers and for dynamic standing balance with use of the RW.  She was able to stand and alternate reaching for cup on her bedside table with min guard assist for balance.  Demonstrates limited endurance for standing tasks and with functional mobiilty.     Cognition: Cognition Overall Cognitive Status: Difficult to assess (but appears [facility].) Orientation Level: Oriented X4 Cognition Arousal/Alertness: Awake/alert Behavior During Therapy: WFL for tasks assessed/performed Overall Cognitive Status: Difficult to assess (but appears [facility] Difficult to assess due to: Non-English speaking   Blood pressure 105/56, pulse 89, temperature 98.3 F (36.8 C), temperature source Oral, resp. rate 16, height 4' 11"  (1.499 m), weight 52.5 kg (115 lb 11.9 oz), last menstrual period 05/12/2013, SpO2 94 %.     Physical Exam  Nursing note and vitals reviewed. Constitutional: She is oriented to person, place, and time. She appears well-developed and well-nourished.  Perseverated on rectal numbness and difficulty speaking since intubation.    HENT:  Head: Normocephalic and atraumatic.  Eyes: Conjunctivae are normal. Pupils are equal, round, and reactive to light.  Neck: Normal range of motion. Neck supple.  Cardiovascular: Normal rate and regular rhythm.   Respiratory: Effort normal and breath sounds normal. No respiratory distress. She has no wheezes.  GI: Soft. Bowel sounds are normal. She exhibits no distension. There is tenderness.  Musculoskeletal: She exhibits no tenderness.  L-BKA with dry compressive dressing.   Neurological: She is alert and oriented to person, place, and time.  No dysarthria or slurring noted. Able to follow basic motor commands without difficulty. Moves BUE, RLE and Left residual limb without difficulty. UE 4/5. RLE 3/5 HF,KE and 4/5 adf/ LLE: HF 3/5. No sensory deficits.  Skin: Skin is warm and dry.   Psychiatric: She has a normal mood and affect. Her speech is normal and behavior is normal.    Results for orders placed or performed during the hospital encounter of 06/01/15 (from the past 24 hour(s))  Glucose, capillary     Status: Abnormal   Collection Time: 06/10/15 11:39 AM  Result Value Ref Range   Glucose-Capillary 147 (H) 65 - 99 mg/dL  Glucose, capillary     Status: Abnormal   Collection Time: 06/10/15  4:57 PM  Result Value Ref Range   Glucose-Capillary 213 (H) 65 - 99 mg/dL  Glucose, capillary     Status: Abnormal   Collection Time: 06/10/15  8:44 PM  Result Value Ref Range   Glucose-Capillary 225 (H) 65 - 99 mg/dL  Glucose, capillary     Status: Abnormal   Collection Time: 06/11/15  7:42 AM  Result Value Ref Range   Glucose-Capillary 207 (H) 65 - 99 mg/dL   No results found.  Assessment/Plan: Diagnosis: left BKA 1. Does the need for close, 24 hr/day medical supervision in concert with the patient's rehab needs make  it unreasonable for this patient to be served in a less intensive setting? Yes 2. Co-Morbidities requiring supervision/potential complications: sepsis, dm2 3. Due to bladder management, bowel management, safety, skin/wound care, disease management, medication administration, pain management and patient education, does the patient require 24 hr/day rehab nursing? Yes 4. Does the patient require coordinated care of a physician, rehab nurse, PT (1-2 hrs/day, 5 days/week) and OT (1-2 hrs/day, 5 days/week) to address physical and functional deficits in the context of the above medical diagnosis(es)? Yes Addressing deficits in the following areas: balance, endurance, locomotion, strength, transferring, bowel/bladder control, bathing, dressing, feeding, grooming, toileting and psychosocial support 5. Can the patient actively participate in an intensive therapy program of at least 3 hrs of therapy per day at least 5 days per week? Yes 6. The potential for patient to  make measurable gains while on inpatient rehab is excellent 7. Anticipated functional outcomes upon discharge from inpatient rehab are modified independent  with PT, modified independent with OT, n/a with SLP. 8. Estimated rehab length of stay to reach the above functional goals is: 7-8 days 9. Does the patient have adequate social supports and living environment to accommodate these discharge functional goals? Yes 10. Anticipated D/C setting: Home 11. Anticipated post D/C treatments: HH therapy and Outpatient therapy 12. Overall Rehab/Functional Prognosis: excellent  RECOMMENDATIONS: This patient's condition is appropriate for continued rehabilitative care in the following setting: CIR Patient has agreed to participate in recommended program. Potentially Note that insurance prior authorization may be required for reimbursement for recommended care.  Comment: Rehab Admissions Coordinator to follow up. Needs a Optometrist.  Thanks,  Meredith Staggers, MD, [facility]     06/11/2015

## 2015-06-11 NOTE — Interval H&P Note (Signed)
History and Physical Interval Note:  06/11/2015 2:30 PM  [name]  has presented today for surgery, with the diagnosis of BACTEREMIA   The various methods of treatment have been discussed with the patient and family. After consideration of risks, benefits and other options for treatment, the patient has consented to  Procedure(s): TRANSESOPHAGEAL ECHOCARDIOGRAM (TEE) (N/A) as a surgical intervention .  The patient's history has been reviewed, patient examined, no change in status, stable for surgery.  I have reviewed the patient's chart and labs.  Questions were answered to the patient's satisfaction.     Kirk Ruths

## 2015-06-11 NOTE — Progress Notes (Signed)
  Echocardiogram Echocardiogram Transesophageal has been performed.  [name] 06/11/2015, 3:10 PM

## 2015-06-11 NOTE — Progress Notes (Signed)
Pt down having a TEE.  Will follow up in the am  Meredith Staggers, MD, [facility] 06/11/2015

## 2015-06-11 NOTE — Progress Notes (Signed)
Inpatient Diabetes Program Recommendations  AACE/ADA: New Consensus Statement on Inpatient Glycemic Control (2013)  Target Ranges:  Prepandial:   less than 140 mg/dL      Peak postprandial:   less than 180 mg/dL (1-2 hours)      Critically ill patients:  140 - 180 mg/dL   Results for LOIE, JAHR (MRN [id number]) as of 06/11/2015 10:55  Ref. Range 06/10/2015 07:53 06/10/2015 11:39 06/10/2015 16:57 06/10/2015 20:44  Glucose-Capillary Latest Ref Range: 65-99 mg/dL 189 (H) 147 (H) 213 (H) 225 (H)   Results for FLORRIE, RAMIRES (MRN [id number]) as of 06/11/2015 10:55  Ref. Range 06/11/2015 07:42  Glucose-Capillary Latest Ref Range: 65-99 mg/dL 207 (H)     Home DM Meds: Lantus 16 units QHS  Novolog 18 units QID  Current DM Orders: Lantus 5 units QHS  Novolog Moderate SSI (0-15 units) TID AC + HS    -PO Intake 50-100% of meals.  -Fasting glucose elevated this AM.   MD- Please consider the following in-hospital insulin adjustments:  1. Increase Lantus to 10 units QHS  2. Add low dose Novolog Meal Coverage- Novolog 3 units tidwc    Will follow Wyn Quaker RN, MSN, CDE Diabetes Coordinator Inpatient Glycemic Control Team Team Pager: [phone] (8a-5p)

## 2015-06-11 NOTE — CV Procedure (Signed)
See full TEE report in [employer]; normal LV function; no vegetations identified; atrial septal aneurysm; small secundum ASD noted. Kirk Ruths

## 2015-06-11 NOTE — H&P (View-Only) (Signed)
PROGRESS NOTE  [name] KVQ:[id number] DOB: [birth year] DOA: 06/01/2015 PCP: Doris Cheadle, MD  HPI/Recap of past 24 hours:  Denies pain, working with physical therapy, npo for TEE at 2:30pm today  Assessment/Plan: Principal Problem:   Staphylococcus aureus bacteremia with sepsis Active Problems:   Septic shock   Gangrene of foot   Diabetic ulcer of left foot associated with type 1 diabetes mellitus   Respiratory failure   Severe sepsis   Anemia of chronic disease   Diabetes mellitus type 2, controlled   S/P BKA (below knee amputation) unilateral  #1. 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 Cardiology consulted for TEE.  #2. Diabetes mellitus type 2 - a1c 11.7 ( 05/2015), adjust insulin prn  #3. Anemia - stable  #4 Candida Vulvovaginitis - Completed one day of Diflucan on 7/4. Currently on Clotrimazole vaginal cream.  #5 h/o stage IV vaginal cancer,  defer to outpatient oncology  Code Status: full  Family Communication: patient  Disposition Plan: cir when medically stable   Consultants:  Cardiology for TEE  Infectious disease  CIR  Orthopedics  PCCM ( intensive care), signed off  Procedures: 7/3 Intubation /extubation 7/7 7/3 Left [place] CVL > 7/03 Lt [place] amputation 7/04 Lt leg amputation through tibia, fibula 7/06 Return to OR for further debridement 7/9  Return to OR for further debridement and closure of left BKA 7/12 TEE    Antibiotics: Clindamycin 7/3>>7/5 Zosyn 7/2>>7/5 Vancomycin 7/2>>7/6 Ceftriaxone7/5, 7/6 Clotrimazole vaginal cream, Completed Diflucan x1 7/4 Cefazolin from 7/6   Objective: BP 105/56 mmHg  Pulse 89  Temp(Src) 98.3 F (36.8 C) (Oral)  Resp 16  Ht 4\' 11"  (1.499 m)  Wt 52.5 kg (115 lb 11.9 oz)  BMI 23.36 kg/m2  SpO2 94%  LMP  05/12/2013  Intake/Output Summary (Last 24 hours) at 06/11/15 1141 Last data filed at 06/11/15 0829  Gross per 24 hour  Intake  971.5 ml  Output   2250 ml  Net -1278.5 ml   Filed Weights   06/03/15 0615 06/04/15 0500 06/06/15 0500  Weight: 59.2 kg (130 lb 8.2 oz) 54.4 kg (119 lb 14.9 oz) 52.5 kg (115 lb 11.9 oz)    Exam:   General:  NAD, sitting in chair  Cardiovascular: RRR  Respiratory: CTABL  Abdomen: Soft/ND/NT, positive BS  Musculoskeletal: No Edema, s/p left BKA, dressing/ace wrap in place  Neuro: aox3  Data Reviewed: Basic Metabolic Panel:  Recent Labs Lab 06/05/15 0440 06/06/15 0509 06/07/15 0402 06/08/15 0322 06/09/15 2025  NA 145 141 136 136 135  K 3.3* 3.1* 4.0 3.8 3.6  CL 109 102 101 100* 98*  CO2 31 31 29 30 29   GLUCOSE 141* 116* 274* 294* 180*  BUN 9 <5* 6 9 <5*  CREATININE 0.59 0.47 0.61 0.57 0.52  CALCIUM 7.4* 7.5* 7.4* 7.6* 8.2*  MG  --   --   --  2.0  --   PHOS  --   --   --  3.3  --    Liver Function Tests:  Recent Labs Lab 06/09/15 2025  AST 31  ALT 15  ALKPHOS 89  BILITOT 0.3  PROT 6.7  ALBUMIN 2.0*   No results for input(s): LIPASE, AMYLASE in the last 168 hours. No results for input(s): AMMONIA in the last 168 hours. CBC:  Recent Labs Lab 06/06/15 0509 06/08/15 0322 06/09/15 2025  WBC 10.6* 7.2 10.9*  NEUTROABS  --   --  8.4*  HGB 8.4* 8.0* 8.7*  HCT 25.2* 24.2* 26.2*  MCV 87.8 87.4 88.8  PLT 366 374 432*   Cardiac Enzymes:   No results for input(s): CKTOTAL, CKMB, CKMBINDEX, TROPONINI in the last 168 hours. BNP (last 3 results)  Recent Labs  06/02/15 0415  BNP 194.6*    ProBNP (last 3 results) No results for input(s): PROBNP in the last 8760 hours.  CBG:  Recent Labs Lab 06/10/15 0753 06/10/15 1139 06/10/15 1657 06/10/15 2044 06/11/15 0742  GLUCAP 189* 147* 213* 225* 207*    Recent Results (from the past 240 hour(s))  Blood Culture (routine x 2)     Status: None   Collection Time: 06/01/15   9:29 PM  Result Value Ref Range Status   Specimen Description BLOOD RIGHT ANTECUBITAL  Final   Special Requests BOTTLES DRAWN AEROBIC AND ANAEROBIC 5CC  Final   Culture  Setup Time   Final    GRAM POSITIVE COCCI IN CLUSTERS CRITICAL RESULT CALLED TO, READ BACK BY AND VERIFIED WITH: Cristobal Goldmann [id number] WILDERK IN BOTH AEROBIC AND ANAEROBIC BOTTLES    Culture   Final    STAPHYLOCOCCUS AUREUS VIRIDANS STREPTOCOCCUS Performed at [facility]    Report Status 06/10/2015 FINAL  Final   Organism ID, Bacteria STAPHYLOCOCCUS AUREUS  Final   Organism ID, Bacteria VIRIDANS STREPTOCOCCUS  Final      Susceptibility   Staphylococcus aureus - MIC*    CIPROFLOXACIN <=0.5 SENSITIVE Sensitive     ERYTHROMYCIN >=8 RESISTANT Resistant     GENTAMICIN <=0.5 SENSITIVE Sensitive     OXACILLIN <=0.25 SENSITIVE Sensitive     TETRACYCLINE >=16 RESISTANT Resistant     VANCOMYCIN <=0.5 SENSITIVE Sensitive     TRIMETH/SULFA <=10 SENSITIVE Sensitive     CLINDAMYCIN <=0.25 RESISTANT Resistant     RIFAMPIN <=0.5 SENSITIVE Sensitive     Inducible Clindamycin POSITIVE Resistant     * STAPHYLOCOCCUS AUREUS   Viridans streptococcus - MIC*    PENICILLIN <=0.06 SENSITIVE Sensitive     CEFTRIAXONE 0.25 SENSITIVE Sensitive     LEVOFLOXACIN 1 SENSITIVE Sensitive     VANCOMYCIN 0.5 SENSITIVE Sensitive     * VIRIDANS STREPTOCOCCUS  Blood Culture (routine x 2)     Status: None   Collection Time: 06/01/15  9:29 PM  Result Value Ref Range Status   Specimen Description BLOOD LEFT ANTECUBITAL  Final   Special Requests BOTTLES DRAWN AEROBIC AND ANAEROBIC 5CC  Final   Culture  Setup Time   Final    GRAM POSITIVE COCCI IN CLUSTERS IN BOTH AEROBIC AND ANAEROBIC BOTTLES CRITICAL RESULT CALLED TO, READ BACK BY AND VERIFIED WITH: S DENNY,RN [id number] WILDERK CONFIRMED BY K WILDER    Culture   Final    STAPHYLOCOCCUS AUREUS SUSCEPTIBILITIES PERFORMED ON PREVIOUS CULTURE WITHIN THE LAST 5 DAYS. Performed at  [facility]    Report Status 06/08/2015 FINAL  Final  Urine culture     Status: None   Collection Time: 06/01/15  9:44 PM  Result Value Ref Range Status   Specimen Description URINE, CATHETERIZED  Final   Special Requests NONE  Final   Culture   Final    >=100,000 COLONIES/mL ESCHERICHIA COLI Performed at [facility]    Report Status 06/04/2015 FINAL  Final   Organism ID, Bacteria ESCHERICHIA COLI  Final      Susceptibility   Escherichia coli -  MIC*    AMPICILLIN >=32 RESISTANT Resistant     CEFAZOLIN <=4 SENSITIVE Sensitive     CEFTRIAXONE <=1 SENSITIVE Sensitive     CIPROFLOXACIN <=0.25 SENSITIVE Sensitive     GENTAMICIN <=1 SENSITIVE Sensitive     IMIPENEM <=0.25 SENSITIVE Sensitive     NITROFURANTOIN <=16 SENSITIVE Sensitive     TRIMETH/SULFA <=20 SENSITIVE Sensitive     AMPICILLIN/SULBACTAM 16 INTERMEDIATE Intermediate     PIP/TAZO <=4 SENSITIVE Sensitive     * >=100,000 COLONIES/mL ESCHERICHIA COLI  Gram stain     Status: None   Collection Time: 06/02/15  1:30 AM  Result Value Ref Range Status   Specimen Description ABSCESS L FOOT  Final   Special Requests NONE  Final   Gram Stain   Final    ABUNDANT GRAM POSITIVE COCCI IN PAIRS FEW WBC PRESENT, PREDOMINANTLY PMN Gram Stain Report Called to,Read Back By and Verified With: Claris Gladden RN @ 0327 ON 06/02/15 BY C DAVIS    Report Status 06/02/2015 FINAL  Final  Culture, routine-abscess     Status: None   Collection Time: 06/02/15  1:30 AM  Result Value Ref Range Status   Specimen Description ABSCESS L FOOT A  Final   Special Requests NONE  Final   Gram Stain   Final    FEW WBC PRESENT, PREDOMINANTLY PMN ABUNDANT GRAM POSITIVE COCCI IN PAIRS Gram Stain Report Called to,Read Back By and Verified With: Gram Stain Report Called to,Read Back By and Verified With: Claris Gladden RN @ 0327 ON 06/02/15 BY C DAVIS Performed by [facility] Performed at [facility]    Culture   Final    MODERATE  STAPHYLOCOCCUS AUREUS Note: RIFAMPIN AND GENTAMICIN SHOULD NOT BE USED AS SINGLE DRUGS FOR TREATMENT OF STAPH INFECTIONS. This organism is presumed to be Clindamycin resistant based on detection of inducible Clindamycin resistance. Performed at [facility]    Report Status 06/06/2015 FINAL  Final   Organism ID, Bacteria STAPHYLOCOCCUS AUREUS  Final      Susceptibility   Staphylococcus aureus - MIC*    CLINDAMYCIN RESISTANT      ERYTHROMYCIN >=8 RESISTANT Resistant     GENTAMICIN <=0.5 SENSITIVE Sensitive     LEVOFLOXACIN 0.25 SENSITIVE Sensitive     OXACILLIN 0.5 SENSITIVE Sensitive     PENICILLIN >=0.5 RESISTANT Resistant     RIFAMPIN <=0.5 SENSITIVE Sensitive     TRIMETH/SULFA <=10 SENSITIVE Sensitive     VANCOMYCIN 1 SENSITIVE Sensitive     TETRACYCLINE >=16 RESISTANT Resistant     MOXIFLOXACIN <=0.25 SENSITIVE Sensitive     * MODERATE STAPHYLOCOCCUS AUREUS  Culture, routine-abscess     Status: None   Collection Time: 06/02/15  1:30 AM  Result Value Ref Range Status   Specimen Description ABSCESS L FOOT B  Final   Special Requests NONE  Final   Gram Stain   Final    FEW WBC PRESENT,BOTH PMN AND MONONUCLEAR NO SQUAMOUS EPITHELIAL CELLS SEEN ABUNDANT GRAM NEGATIVE RODS MODERATE GRAM POSITIVE COCCI IN PAIRS IN CLUSTERS    Culture   Final    MODERATE STAPHYLOCOCCUS AUREUS Note: RIFAMPIN AND GENTAMICIN SHOULD NOT BE USED AS SINGLE DRUGS FOR TREATMENT OF STAPH INFECTIONS. This organism is presumed to be Clindamycin resistant based on detection of inducible Clindamycin resistance. Performed at [facility]    Report Status 06/06/2015 FINAL  Final   Organism ID, Bacteria STAPHYLOCOCCUS AUREUS  Final      Susceptibility  Staphylococcus aureus - MIC*    CLINDAMYCIN RESISTANT      ERYTHROMYCIN >=8 RESISTANT Resistant     GENTAMICIN <=0.5 SENSITIVE Sensitive     LEVOFLOXACIN 0.25 SENSITIVE Sensitive     OXACILLIN 0.5 SENSITIVE Sensitive     PENICILLIN >=0.5  RESISTANT Resistant     RIFAMPIN <=0.5 SENSITIVE Sensitive     TRIMETH/SULFA <=10 SENSITIVE Sensitive     VANCOMYCIN <=0.5 SENSITIVE Sensitive     TETRACYCLINE >=16 RESISTANT Resistant     MOXIFLOXACIN <=0.25 SENSITIVE Sensitive     * MODERATE STAPHYLOCOCCUS AUREUS  Anaerobic culture     Status: None   Collection Time: 06/02/15  1:30 AM  Result Value Ref Range Status   Specimen Description ABSCESS L FOOT A  Final   Special Requests NONE  Final   Gram Stain   Final    MODERATE WBC PRESENT,BOTH PMN AND MONONUCLEAR NO SQUAMOUS EPITHELIAL CELLS SEEN ABUNDANT GRAM NEGATIVE RODS ABUNDANT GRAM POSITIVE COCCI IN PAIRS IN CLUSTERS    Culture   Final    NO ANAEROBES ISOLATED Performed at [facility]    Report Status 06/07/2015 FINAL  Final  Anaerobic culture     Status: None   Collection Time: 06/02/15  1:30 AM  Result Value Ref Range Status   Specimen Description ABSCESS L FOOT B  Final   Special Requests NONE  Final   Gram Stain   Final    MODERATE WBC PRESENT,BOTH PMN AND MONONUCLEAR NO SQUAMOUS EPITHELIAL CELLS SEEN ABUNDANT GRAM NEGATIVE RODS ABUNDANT GRAM POSITIVE COCCI IN PAIRS IN CLUSTERS    Culture   Final    NO ANAEROBES ISOLATED Performed at [facility]    Report Status 06/07/2015 FINAL  Final  MRSA PCR Screening     Status: None   Collection Time: 06/02/15  3:19 AM  Result Value Ref Range Status   MRSA by PCR NEGATIVE NEGATIVE Final    Comment:        The GeneXpert MRSA Assay (FDA approved for NASAL specimens only), is one component of a comprehensive MRSA colonization surveillance program. It is not intended to diagnose MRSA infection nor to guide or monitor treatment for MRSA infections.   Culture, blood (routine x 2)     Status: None   Collection Time: 06/05/15  3:05 PM  Result Value Ref Range Status   Specimen Description BLOOD RIGHT HAND  Final   Special Requests BOTTLES DRAWN AEROBIC ONLY 10CC  Final   Culture NO GROWTH 5 DAYS   Final   Report Status 06/10/2015 FINAL  Final  Culture, blood (routine x 2)     Status: None   Collection Time: 06/05/15  6:09 PM  Result Value Ref Range Status   Specimen Description BLOOD RIGHT HAND  Final   Special Requests BOTTLES DRAWN AEROBIC AND ANAEROBIC 10CC  Final   Culture NO GROWTH 5 DAYS  Final   Report Status 06/10/2015 FINAL  Final     Studies: No results found.  Scheduled Meds: . antiseptic oral rinse  7 mL Mouth Rinse BID  .  ceFAZolin (ANCEF) IV  2 g Intravenous 3 times per day  . feeding supplement (PRO-STAT SUGAR FREE 64)  30 mL Oral BID  . insulin aspart  0-15 Units Subcutaneous TID WC  . insulin aspart  0-5 Units Subcutaneous QHS  . insulin glargine  5 Units Subcutaneous QHS  . multivitamin with minerals  1 tablet Oral Daily    Continuous Infusions:  Time spent:  Baird Polinski MD, PhD  [facility] Pager [phone]. If 7PM-7AM, please contact night-coverage at [email or web address], password [facility] 06/11/2015, 11:41 AM  LOS: 10 days

## 2015-06-11 NOTE — Progress Notes (Signed)
Physical Therapy Treatment Patient Details Name: [name] MRN: [id number] DOB: 19-May-1966 Today's Date: 06/11/2015    History of Present Illness 49 y.o. 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 IRRIGATION AND DEBRIDEMENT LEFT LEG, WITH CLOSURE OF BKA 7/9.    PT Comments    Patient progressing well towards PT goals. Continues to fatigue during ambulation and mobility requiring seated rest breaks. Generalized weakness noted and reported per pt. Pt also reports a new numbness sensation on buttocks. RN notified. Utilized interpreter and answered all questions asked by pt and spouse. Will continue to follow per current POC.    Follow Up Recommendations  CIR     Equipment Recommendations  Other (comment)    Recommendations for Other Services       Precautions / Restrictions Precautions Precautions: Fall Restrictions Weight Bearing Restrictions: Yes LLE Weight Bearing: Non weight bearing    Mobility  Bed Mobility Overal bed mobility: Needs Assistance Bed Mobility: Supine to Sit     Supine to sit: Min guard     General bed mobility comments: Close guard for safety. Requires extra time. Able to perform without physical assist.  Transfers Overall transfer level: Needs assistance Equipment used: Rolling walker (2 wheeled) Transfers: Sit to/from Stand Sit to Stand: Min assist         General transfer comment: Requires Mod cues for hand placement. Stood from [employer], from chair x 3. Assist for steadying. Transferred to chair post ambulation bout.  Ambulation/Gait Ambulation/Gait assistance: Min assist Ambulation Distance (Feet): 20 Feet (+ 15' + 25') Assistive device: Rolling walker (2 wheeled) Gait Pattern/deviations: Step-to pattern (hop to gait pattern) Gait velocity: decreased Gait velocity interpretation: <1.8  ft/sec, indicative of risk for recurrent falls General Gait Details: Pt fatigues easily requiring 3 seated rest breaks. Very slow gait. No knee buckling noted.    Stairs            Wheelchair Mobility    Modified Rankin (Stroke Patients Only)       Balance Overall balance assessment: Needs assistance Sitting-balance support: Feet supported;No upper extremity supported Sitting balance-Leahy Scale: Good     Standing balance support: During functional activity Standing balance-Leahy Scale: Poor Standing balance comment: Needs RW for support.                     Cognition Arousal/Alertness: Awake/alert Behavior During Therapy: WFL for tasks assessed/performed Overall Cognitive Status: Within Functional Limits for tasks assessed                      Exercises      General Comments        Pertinent Vitals/Pain Pain Assessment: 0-10 Pain Score: 5  Pain Location: left residual limb Pain Descriptors / Indicators: Grimacing Pain Intervention(s): Monitored during session;Repositioned    Home Living                      Prior Function            PT Goals (current goals can now be found in the care plan section) Progress towards PT goals: Progressing toward goals    Frequency  Min 5X/week    PT Plan Current plan remains appropriate    Co-evaluation             End of Session Equipment Utilized During Treatment: Gait  belt Activity Tolerance: Patient tolerated treatment well Patient left: in chair;with call bell/phone within reach;with family/visitor present     Time: 1050-1116 PT Time Calculation (min) (ACUTE ONLY): 26 min  Charges:  $Gait Training: 23-37 mins                    G Codes:      Denton Derks A Saquan Furtick 06/11/2015, 11:53 AM Mylo Red, PT, DPT [phone]

## 2015-06-11 NOTE — Progress Notes (Signed)
PROGRESS NOTE  [name] KVQ:[id number] DOB: [birth year] DOA: 06/01/2015 PCP: Doris Cheadle, MD  HPI/Recap of past 24 hours:  Denies pain, working with physical therapy, npo for TEE at 2:30pm today  Assessment/Plan: Principal Problem:   Staphylococcus aureus bacteremia with sepsis Active Problems:   Septic shock   Gangrene of foot   Diabetic ulcer of left foot associated with type 1 diabetes mellitus   Respiratory failure   Severe sepsis   Anemia of chronic disease   Diabetes mellitus type 2, controlled   S/P BKA (below knee amputation) unilateral  #1. 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 Cardiology consulted for TEE.  #2. Diabetes mellitus type 2 - a1c 11.7 ( 05/2015), adjust insulin prn  #3. Anemia - stable  #4 Candida Vulvovaginitis - Completed one day of Diflucan on 7/4. Currently on Clotrimazole vaginal cream.  #5 h/o stage IV vaginal cancer,  defer to outpatient oncology  Code Status: full  Family Communication: patient  Disposition Plan: cir when medically stable   Consultants:  Cardiology for TEE  Infectious disease  CIR  Orthopedics  PCCM ( intensive care), signed off  Procedures: 7/3 Intubation /extubation 7/7 7/3 Left [place] CVL > 7/03 Lt [place] amputation 7/04 Lt leg amputation through tibia, fibula 7/06 Return to OR for further debridement 7/9  Return to OR for further debridement and closure of left BKA 7/12 TEE    Antibiotics: Clindamycin 7/3>>7/5 Zosyn 7/2>>7/5 Vancomycin 7/2>>7/6 Ceftriaxone7/5, 7/6 Clotrimazole vaginal cream, Completed Diflucan x1 7/4 Cefazolin from 7/6   Objective: BP 105/56 mmHg  Pulse 89  Temp(Src) 98.3 F (36.8 C) (Oral)  Resp 16  Ht 4\' 11"  (1.499 m)  Wt 52.5 kg (115 lb 11.9 oz)  BMI 23.36 kg/m2  SpO2 94%  LMP  05/12/2013  Intake/Output Summary (Last 24 hours) at 06/11/15 1141 Last data filed at 06/11/15 0829  Gross per 24 hour  Intake  971.5 ml  Output   2250 ml  Net -1278.5 ml   Filed Weights   06/03/15 0615 06/04/15 0500 06/06/15 0500  Weight: 59.2 kg (130 lb 8.2 oz) 54.4 kg (119 lb 14.9 oz) 52.5 kg (115 lb 11.9 oz)    Exam:   General:  NAD, sitting in chair  Cardiovascular: RRR  Respiratory: CTABL  Abdomen: Soft/ND/NT, positive BS  Musculoskeletal: No Edema, s/p left BKA, dressing/ace wrap in place  Neuro: aox3  Data Reviewed: Basic Metabolic Panel:  Recent Labs Lab 06/05/15 0440 06/06/15 0509 06/07/15 0402 06/08/15 0322 06/09/15 2025  NA 145 141 136 136 135  K 3.3* 3.1* 4.0 3.8 3.6  CL 109 102 101 100* 98*  CO2 31 31 29 30 29   GLUCOSE 141* 116* 274* 294* 180*  BUN 9 <5* 6 9 <5*  CREATININE 0.59 0.47 0.61 0.57 0.52  CALCIUM 7.4* 7.5* 7.4* 7.6* 8.2*  MG  --   --   --  2.0  --   PHOS  --   --   --  3.3  --    Liver Function Tests:  Recent Labs Lab 06/09/15 2025  AST 31  ALT 15  ALKPHOS 89  BILITOT 0.3  PROT 6.7  ALBUMIN 2.0*   No results for input(s): LIPASE, AMYLASE in the last 168 hours. No results for input(s): AMMONIA in the last 168 hours. CBC:  Recent Labs Lab 06/06/15 0509 06/08/15 0322 06/09/15 2025  WBC 10.6* 7.2 10.9*  NEUTROABS  --   --  8.4*  HGB 8.4* 8.0* 8.7*  HCT 25.2* 24.2* 26.2*  MCV 87.8 87.4 88.8  PLT 366 374 432*   Cardiac Enzymes:   No results for input(s): CKTOTAL, CKMB, CKMBINDEX, TROPONINI in the last 168 hours. BNP (last 3 results)  Recent Labs  06/02/15 0415  BNP 194.6*    ProBNP (last 3 results) No results for input(s): PROBNP in the last 8760 hours.  CBG:  Recent Labs Lab 06/10/15 0753 06/10/15 1139 06/10/15 1657 06/10/15 2044 06/11/15 0742  GLUCAP 189* 147* 213* 225* 207*    Recent Results (from the past 240 hour(s))  Blood Culture (routine x 2)     Status: None   Collection Time: 06/01/15   9:29 PM  Result Value Ref Range Status   Specimen Description BLOOD RIGHT ANTECUBITAL  Final   Special Requests BOTTLES DRAWN AEROBIC AND ANAEROBIC 5CC  Final   Culture  Setup Time   Final    GRAM POSITIVE COCCI IN CLUSTERS CRITICAL RESULT CALLED TO, READ BACK BY AND VERIFIED WITH: Cristobal Goldmann [id number] WILDERK IN BOTH AEROBIC AND ANAEROBIC BOTTLES    Culture   Final    STAPHYLOCOCCUS AUREUS VIRIDANS STREPTOCOCCUS Performed at [facility]    Report Status 06/10/2015 FINAL  Final   Organism ID, Bacteria STAPHYLOCOCCUS AUREUS  Final   Organism ID, Bacteria VIRIDANS STREPTOCOCCUS  Final      Susceptibility   Staphylococcus aureus - MIC*    CIPROFLOXACIN <=0.5 SENSITIVE Sensitive     ERYTHROMYCIN >=8 RESISTANT Resistant     GENTAMICIN <=0.5 SENSITIVE Sensitive     OXACILLIN <=0.25 SENSITIVE Sensitive     TETRACYCLINE >=16 RESISTANT Resistant     VANCOMYCIN <=0.5 SENSITIVE Sensitive     TRIMETH/SULFA <=10 SENSITIVE Sensitive     CLINDAMYCIN <=0.25 RESISTANT Resistant     RIFAMPIN <=0.5 SENSITIVE Sensitive     Inducible Clindamycin POSITIVE Resistant     * STAPHYLOCOCCUS AUREUS   Viridans streptococcus - MIC*    PENICILLIN <=0.06 SENSITIVE Sensitive     CEFTRIAXONE 0.25 SENSITIVE Sensitive     LEVOFLOXACIN 1 SENSITIVE Sensitive     VANCOMYCIN 0.5 SENSITIVE Sensitive     * VIRIDANS STREPTOCOCCUS  Blood Culture (routine x 2)     Status: None   Collection Time: 06/01/15  9:29 PM  Result Value Ref Range Status   Specimen Description BLOOD LEFT ANTECUBITAL  Final   Special Requests BOTTLES DRAWN AEROBIC AND ANAEROBIC 5CC  Final   Culture  Setup Time   Final    GRAM POSITIVE COCCI IN CLUSTERS IN BOTH AEROBIC AND ANAEROBIC BOTTLES CRITICAL RESULT CALLED TO, READ BACK BY AND VERIFIED WITH: S DENNY,RN [id number] WILDERK CONFIRMED BY K WILDER    Culture   Final    STAPHYLOCOCCUS AUREUS SUSCEPTIBILITIES PERFORMED ON PREVIOUS CULTURE WITHIN THE LAST 5 DAYS. Performed at  [facility]    Report Status 06/08/2015 FINAL  Final  Urine culture     Status: None   Collection Time: 06/01/15  9:44 PM  Result Value Ref Range Status   Specimen Description URINE, CATHETERIZED  Final   Special Requests NONE  Final   Culture   Final    >=100,000 COLONIES/mL ESCHERICHIA COLI Performed at [facility]    Report Status 06/04/2015 FINAL  Final   Organism ID, Bacteria ESCHERICHIA COLI  Final      Susceptibility   Escherichia coli -  MIC*    AMPICILLIN >=32 RESISTANT Resistant     CEFAZOLIN <=4 SENSITIVE Sensitive     CEFTRIAXONE <=1 SENSITIVE Sensitive     CIPROFLOXACIN <=0.25 SENSITIVE Sensitive     GENTAMICIN <=1 SENSITIVE Sensitive     IMIPENEM <=0.25 SENSITIVE Sensitive     NITROFURANTOIN <=16 SENSITIVE Sensitive     TRIMETH/SULFA <=20 SENSITIVE Sensitive     AMPICILLIN/SULBACTAM 16 INTERMEDIATE Intermediate     PIP/TAZO <=4 SENSITIVE Sensitive     * >=100,000 COLONIES/mL ESCHERICHIA COLI  Gram stain     Status: None   Collection Time: 06/02/15  1:30 AM  Result Value Ref Range Status   Specimen Description ABSCESS L FOOT  Final   Special Requests NONE  Final   Gram Stain   Final    ABUNDANT GRAM POSITIVE COCCI IN PAIRS FEW WBC PRESENT, PREDOMINANTLY PMN Gram Stain Report Called to,Read Back By and Verified With: Claris Gladden RN @ 0327 ON 06/02/15 BY C DAVIS    Report Status 06/02/2015 FINAL  Final  Culture, routine-abscess     Status: None   Collection Time: 06/02/15  1:30 AM  Result Value Ref Range Status   Specimen Description ABSCESS L FOOT A  Final   Special Requests NONE  Final   Gram Stain   Final    FEW WBC PRESENT, PREDOMINANTLY PMN ABUNDANT GRAM POSITIVE COCCI IN PAIRS Gram Stain Report Called to,Read Back By and Verified With: Gram Stain Report Called to,Read Back By and Verified With: Claris Gladden RN @ 0327 ON 06/02/15 BY C DAVIS Performed by [facility] Performed at [facility]    Culture   Final    MODERATE  STAPHYLOCOCCUS AUREUS Note: RIFAMPIN AND GENTAMICIN SHOULD NOT BE USED AS SINGLE DRUGS FOR TREATMENT OF STAPH INFECTIONS. This organism is presumed to be Clindamycin resistant based on detection of inducible Clindamycin resistance. Performed at [facility]    Report Status 06/06/2015 FINAL  Final   Organism ID, Bacteria STAPHYLOCOCCUS AUREUS  Final      Susceptibility   Staphylococcus aureus - MIC*    CLINDAMYCIN RESISTANT      ERYTHROMYCIN >=8 RESISTANT Resistant     GENTAMICIN <=0.5 SENSITIVE Sensitive     LEVOFLOXACIN 0.25 SENSITIVE Sensitive     OXACILLIN 0.5 SENSITIVE Sensitive     PENICILLIN >=0.5 RESISTANT Resistant     RIFAMPIN <=0.5 SENSITIVE Sensitive     TRIMETH/SULFA <=10 SENSITIVE Sensitive     VANCOMYCIN 1 SENSITIVE Sensitive     TETRACYCLINE >=16 RESISTANT Resistant     MOXIFLOXACIN <=0.25 SENSITIVE Sensitive     * MODERATE STAPHYLOCOCCUS AUREUS  Culture, routine-abscess     Status: None   Collection Time: 06/02/15  1:30 AM  Result Value Ref Range Status   Specimen Description ABSCESS L FOOT B  Final   Special Requests NONE  Final   Gram Stain   Final    FEW WBC PRESENT,BOTH PMN AND MONONUCLEAR NO SQUAMOUS EPITHELIAL CELLS SEEN ABUNDANT GRAM NEGATIVE RODS MODERATE GRAM POSITIVE COCCI IN PAIRS IN CLUSTERS    Culture   Final    MODERATE STAPHYLOCOCCUS AUREUS Note: RIFAMPIN AND GENTAMICIN SHOULD NOT BE USED AS SINGLE DRUGS FOR TREATMENT OF STAPH INFECTIONS. This organism is presumed to be Clindamycin resistant based on detection of inducible Clindamycin resistance. Performed at [facility]    Report Status 06/06/2015 FINAL  Final   Organism ID, Bacteria STAPHYLOCOCCUS AUREUS  Final      Susceptibility  Staphylococcus aureus - MIC*    CLINDAMYCIN RESISTANT      ERYTHROMYCIN >=8 RESISTANT Resistant     GENTAMICIN <=0.5 SENSITIVE Sensitive     LEVOFLOXACIN 0.25 SENSITIVE Sensitive     OXACILLIN 0.5 SENSITIVE Sensitive     PENICILLIN >=0.5  RESISTANT Resistant     RIFAMPIN <=0.5 SENSITIVE Sensitive     TRIMETH/SULFA <=10 SENSITIVE Sensitive     VANCOMYCIN <=0.5 SENSITIVE Sensitive     TETRACYCLINE >=16 RESISTANT Resistant     MOXIFLOXACIN <=0.25 SENSITIVE Sensitive     * MODERATE STAPHYLOCOCCUS AUREUS  Anaerobic culture     Status: None   Collection Time: 06/02/15  1:30 AM  Result Value Ref Range Status   Specimen Description ABSCESS L FOOT A  Final   Special Requests NONE  Final   Gram Stain   Final    MODERATE WBC PRESENT,BOTH PMN AND MONONUCLEAR NO SQUAMOUS EPITHELIAL CELLS SEEN ABUNDANT GRAM NEGATIVE RODS ABUNDANT GRAM POSITIVE COCCI IN PAIRS IN CLUSTERS    Culture   Final    NO ANAEROBES ISOLATED Performed at [facility]    Report Status 06/07/2015 FINAL  Final  Anaerobic culture     Status: None   Collection Time: 06/02/15  1:30 AM  Result Value Ref Range Status   Specimen Description ABSCESS L FOOT B  Final   Special Requests NONE  Final   Gram Stain   Final    MODERATE WBC PRESENT,BOTH PMN AND MONONUCLEAR NO SQUAMOUS EPITHELIAL CELLS SEEN ABUNDANT GRAM NEGATIVE RODS ABUNDANT GRAM POSITIVE COCCI IN PAIRS IN CLUSTERS    Culture   Final    NO ANAEROBES ISOLATED Performed at [facility]    Report Status 06/07/2015 FINAL  Final  MRSA PCR Screening     Status: None   Collection Time: 06/02/15  3:19 AM  Result Value Ref Range Status   MRSA by PCR NEGATIVE NEGATIVE Final    Comment:        The GeneXpert MRSA Assay (FDA approved for NASAL specimens only), is one component of a comprehensive MRSA colonization surveillance program. It is not intended to diagnose MRSA infection nor to guide or monitor treatment for MRSA infections.   Culture, blood (routine x 2)     Status: None   Collection Time: 06/05/15  3:05 PM  Result Value Ref Range Status   Specimen Description BLOOD RIGHT HAND  Final   Special Requests BOTTLES DRAWN AEROBIC ONLY 10CC  Final   Culture NO GROWTH 5 DAYS   Final   Report Status 06/10/2015 FINAL  Final  Culture, blood (routine x 2)     Status: None   Collection Time: 06/05/15  6:09 PM  Result Value Ref Range Status   Specimen Description BLOOD RIGHT HAND  Final   Special Requests BOTTLES DRAWN AEROBIC AND ANAEROBIC 10CC  Final   Culture NO GROWTH 5 DAYS  Final   Report Status 06/10/2015 FINAL  Final     Studies: No results found.  Scheduled Meds: . antiseptic oral rinse  7 mL Mouth Rinse BID  .  ceFAZolin (ANCEF) IV  2 g Intravenous 3 times per day  . feeding supplement (PRO-STAT SUGAR FREE 64)  30 mL Oral BID  . insulin aspart  0-15 Units Subcutaneous TID WC  . insulin aspart  0-5 Units Subcutaneous QHS  . insulin glargine  5 Units Subcutaneous QHS  . multivitamin with minerals  1 tablet Oral Daily    Continuous Infusions:  Time spent:  Baird Polinski MD, PhD  [facility] Pager [phone]. If 7PM-7AM, please contact night-coverage at [email or web address], password [facility] 06/11/2015, 11:41 AM  LOS: 10 days

## 2015-06-12 ENCOUNTER — Inpatient Hospital Stay (HOSPITAL_COMMUNITY): Admit: 2015-06-12 | Payer: Self-pay | Source: Intra-hospital | Admitting: Physical Medicine & Rehabilitation

## 2015-06-12 ENCOUNTER — Encounter (HOSPITAL_COMMUNITY): Payer: Self-pay | Admitting: Cardiology

## 2015-06-12 ENCOUNTER — Inpatient Hospital Stay (HOSPITAL_COMMUNITY)
Admission: RE | Admit: 2015-06-12 | Discharge: 2015-06-18 | DRG: 560 | Disposition: A | Discharge status: Home Health | Payer: No Typology Code available for payment source | Source: Intra-hospital | Attending: Physical Medicine & Rehabilitation | Admitting: Physical Medicine & Rehabilitation

## 2015-06-12 DIAGNOSIS — C7911 Secondary malignant neoplasm of bladder: Secondary | ICD-10-CM

## 2015-06-12 DIAGNOSIS — R197 Diarrhea, unspecified: Secondary | ICD-10-CM

## 2015-06-12 DIAGNOSIS — E119 Type 2 diabetes mellitus without complications: Secondary | ICD-10-CM

## 2015-06-12 DIAGNOSIS — D509 Iron deficiency anemia, unspecified: Secondary | ICD-10-CM

## 2015-06-12 DIAGNOSIS — E1142 Type 2 diabetes mellitus with diabetic polyneuropathy: Secondary | ICD-10-CM

## 2015-06-12 DIAGNOSIS — Z79899 Other long term (current) drug therapy: Secondary | ICD-10-CM

## 2015-06-12 DIAGNOSIS — R159 Full incontinence of feces: Secondary | ICD-10-CM

## 2015-06-12 DIAGNOSIS — B9561 Methicillin susceptible Staphylococcus aureus infection as the cause of diseases classified elsewhere: Secondary | ICD-10-CM

## 2015-06-12 DIAGNOSIS — Z4781 Encounter for orthopedic aftercare following surgical amputation: Principal | ICD-10-CM

## 2015-06-12 DIAGNOSIS — Z89512 Acquired absence of left leg below knee: Secondary | ICD-10-CM

## 2015-06-12 DIAGNOSIS — Z8544 Personal history of malignant neoplasm of other female genital organs: Secondary | ICD-10-CM

## 2015-06-12 DIAGNOSIS — Z794 Long term (current) use of insulin: Secondary | ICD-10-CM

## 2015-06-12 DIAGNOSIS — R7881 Bacteremia: Secondary | ICD-10-CM

## 2015-06-12 DIAGNOSIS — E46 Unspecified protein-calorie malnutrition: Secondary | ICD-10-CM

## 2015-06-12 LAB — CBC
HEMATOCRIT: 24.3 % — AB (ref 36.0–46.0)
Hemoglobin: 7.9 g/dL — ABNORMAL LOW (ref 12.0–15.0)
MCH: 28.9 pg (ref 26.0–34.0)
MCHC: 32.5 g/dL (ref 30.0–36.0)
MCV: 89 fL (ref 78.0–100.0)
PLATELETS: 540 10*3/uL — AB (ref 150–400)
RBC: 2.73 MIL/uL — ABNORMAL LOW (ref 3.87–5.11)
RDW: 13.7 % (ref 11.5–15.5)
WBC: 7.7 10*3/uL (ref 4.0–10.5)

## 2015-06-12 LAB — BASIC METABOLIC PANEL
Anion gap: 7 (ref 5–15)
BUN: 6 mg/dL (ref 6–20)
CALCIUM: 8 mg/dL — AB (ref 8.9–10.3)
CO2: 27 mmol/L (ref 22–32)
Chloride: 103 mmol/L (ref 101–111)
Creatinine, Ser: 0.55 mg/dL (ref 0.44–1.00)
GFR calc Af Amer: >60 mL/min (ref >60)
GFR calc non Af Amer: >60 mL/min (ref >60)
Glucose, Bld: 242 mg/dL — ABNORMAL HIGH (ref 65–99)
Potassium: 3.5 mmol/L (ref 3.5–5.1)
SODIUM: 137 mmol/L (ref 135–145)

## 2015-06-12 LAB — GLUCOSE, CAPILLARY
GLUCOSE-CAPILLARY: 234 mg/dL — AB (ref 65–99)
GLUCOSE-CAPILLARY: 182 mg/dL — AB (ref 65–99)
Glucose-Capillary: 189 mg/dL — ABNORMAL HIGH (ref 65–99)
Glucose-Capillary: 188 mg/dL — ABNORMAL HIGH (ref 65–99)

## 2015-06-12 MED ORDER — FLEET ENEMA 7-19 GM/118ML RE ENEM: 1.0000 | ENEMA | Freq: Once | RECTAL | Status: AC | PRN

## 2015-06-12 MED ORDER — PROCHLORPERAZINE 25 MG RE SUPP: 12.5000 mg | Freq: Four times a day (QID) | RECTAL | Status: DC | PRN

## 2015-06-12 MED ORDER — ALUM & MAG HYDROXIDE-SIMETH 200-200-20 MG/5ML PO SUSP: 30.0000 mL | ORAL | Status: DC | PRN

## 2015-06-12 MED ORDER — CEFAZOLIN SODIUM-DEXTROSE 2-3 GM-% IV SOLR
2.0000 g | Freq: Three times a day (TID) | INTRAVENOUS | Status: DC
  Administered 2015-06-12 – 2015-06-18 (×17): 2 g via INTRAVENOUS
  Filled 2015-06-12 (×19): qty 50

## 2015-06-12 MED ORDER — SODIUM CHLORIDE 0.9 % IJ SOLN
10.0000 mL | Freq: Two times a day (BID) | INTRAMUSCULAR | Status: DC
  Administered 2015-06-12: 10 mL

## 2015-06-12 MED ORDER — MENTHOL 3 MG MT LOZG
1.0000 | LOZENGE | OROMUCOSAL | Status: DC | PRN
  Filled 2015-06-12: qty 9

## 2015-06-12 MED ORDER — ENOXAPARIN SODIUM 40 MG/0.4ML ~~LOC~~ SOLN
40.0000 mg | Freq: Every day | SUBCUTANEOUS | Status: DC
  Administered 2015-06-12 – 2015-06-17 (×6): 40 mg via SUBCUTANEOUS
  Filled 2015-06-12 (×7): qty 0.4

## 2015-06-12 MED ORDER — BISACODYL 10 MG RE SUPP: 10.0000 mg | Freq: Every day | RECTAL | Status: DC | PRN

## 2015-06-12 MED ORDER — DIPHENHYDRAMINE HCL 12.5 MG/5ML PO ELIX: 12.5000 mg | ORAL_SOLUTION | Freq: Four times a day (QID) | ORAL | Status: DC | PRN

## 2015-06-12 MED ORDER — OXYCODONE HCL 5 MG PO TABS
5.0000 mg | ORAL_TABLET | ORAL | Status: DC | PRN
  Administered 2015-06-16 – 2015-06-17 (×2): 5 mg via ORAL
  Filled 2015-06-12 (×2): qty 1

## 2015-06-12 MED ORDER — INSULIN GLARGINE 100 UNIT/ML ~~LOC~~ SOLN
5.0000 [IU] | Freq: Every day | SUBCUTANEOUS | Status: DC
  Administered 2015-06-12 – 2015-06-17 (×6): 5 [IU] via SUBCUTANEOUS
  Filled 2015-06-12 (×7): qty 0.05

## 2015-06-12 MED ORDER — TRAZODONE HCL 50 MG PO TABS: 25.0000 mg | ORAL_TABLET | Freq: Every evening | ORAL | Status: DC | PRN

## 2015-06-12 MED ORDER — SIMETHICONE 80 MG PO CHEW
80.0000 mg | CHEWABLE_TABLET | Freq: Four times a day (QID) | ORAL | Status: DC
  Administered 2015-06-12 – 2015-06-18 (×22): 80 mg via ORAL
  Filled 2015-06-12 (×27): qty 1

## 2015-06-12 MED ORDER — CALCIUM POLYCARBOPHIL 625 MG PO TABS
625.0000 mg | ORAL_TABLET | Freq: Two times a day (BID) | ORAL | Status: DC
  Administered 2015-06-12 – 2015-06-18 (×12): 625 mg via ORAL
  Filled 2015-06-12 (×15): qty 1

## 2015-06-12 MED ORDER — MAGIC MOUTHWASH W/LIDOCAINE
5.0000 mL | Freq: Four times a day (QID) | ORAL | Status: DC
  Administered 2015-06-12 – 2015-06-18 (×22): 5 mL via ORAL
  Filled 2015-06-12 (×28): qty 5

## 2015-06-12 MED ORDER — ADULT MULTIVITAMIN W/MINERALS CH
1.0000 | ORAL_TABLET | Freq: Every day | ORAL | Status: DC
  Administered 2015-06-13 – 2015-06-18 (×6): 1 via ORAL
  Filled 2015-06-12 (×8): qty 1

## 2015-06-12 MED ORDER — GUAIFENESIN-DM 100-10 MG/5ML PO SYRP: 5.0000 mL | ORAL_SOLUTION | Freq: Four times a day (QID) | ORAL | Status: DC | PRN

## 2015-06-12 MED ORDER — POLYSACCHARIDE IRON COMPLEX 150 MG PO CAPS
150.0000 mg | ORAL_CAPSULE | Freq: Two times a day (BID) | ORAL | Status: DC
  Administered 2015-06-13 – 2015-06-17 (×10): 150 mg via ORAL
  Filled 2015-06-12 (×13): qty 1

## 2015-06-12 MED ORDER — SACCHAROMYCES BOULARDII 250 MG PO CAPS
250.0000 mg | ORAL_CAPSULE | Freq: Two times a day (BID) | ORAL | Status: DC
  Administered 2015-06-12 – 2015-06-18 (×12): 250 mg via ORAL
  Filled 2015-06-12 (×15): qty 1

## 2015-06-12 MED ORDER — INSULIN ASPART 100 UNIT/ML ~~LOC~~ SOLN
0.0000 [IU] | Freq: Every day | SUBCUTANEOUS | Status: DC
  Administered 2015-06-13: 2 [IU] via SUBCUTANEOUS
  Administered 2015-06-16: 3 [IU] via SUBCUTANEOUS
  Administered 2015-06-17: 4 [IU] via SUBCUTANEOUS

## 2015-06-12 MED ORDER — PRO-STAT SUGAR FREE PO LIQD
30.0000 mL | Freq: Three times a day (TID) | ORAL | Status: DC
  Administered 2015-06-12 – 2015-06-18 (×17): 30 mL via ORAL
  Filled 2015-06-12 (×22): qty 30

## 2015-06-12 MED ORDER — PROCHLORPERAZINE EDISYLATE 5 MG/ML IJ SOLN: 5.0000 mg | Freq: Four times a day (QID) | INTRAMUSCULAR | Status: DC | PRN

## 2015-06-12 MED ORDER — METHOCARBAMOL 500 MG PO TABS: 500.0000 mg | ORAL_TABLET | Freq: Four times a day (QID) | ORAL | Status: DC | PRN

## 2015-06-12 MED ORDER — ACETAMINOPHEN 325 MG PO TABS
650.0000 mg | ORAL_TABLET | Freq: Four times a day (QID) | ORAL | Status: DC | PRN
  Administered 2015-06-16 – 2015-06-17 (×2): 650 mg via ORAL
  Filled 2015-06-12: qty 2

## 2015-06-12 MED ORDER — PROCHLORPERAZINE MALEATE 5 MG PO TABS: 5.0000 mg | ORAL_TABLET | Freq: Four times a day (QID) | ORAL | Status: DC | PRN

## 2015-06-12 MED ORDER — SENNOSIDES-DOCUSATE SODIUM 8.6-50 MG PO TABS: 2.0000 | ORAL_TABLET | Freq: Every evening | ORAL | Status: DC | PRN

## 2015-06-12 MED ORDER — INSULIN ASPART 100 UNIT/ML ~~LOC~~ SOLN
0.0000 [IU] | Freq: Three times a day (TID) | SUBCUTANEOUS | Status: DC
  Administered 2015-06-13 (×2): 5 [IU] via SUBCUTANEOUS
  Administered 2015-06-13: 3 [IU] via SUBCUTANEOUS
  Administered 2015-06-14: 8 [IU] via SUBCUTANEOUS
  Administered 2015-06-14: 11 [IU] via SUBCUTANEOUS
  Administered 2015-06-14 – 2015-06-15 (×2): 5 [IU] via SUBCUTANEOUS
  Administered 2015-06-15: 3 [IU] via SUBCUTANEOUS
  Administered 2015-06-15: 8 [IU] via SUBCUTANEOUS
  Administered 2015-06-16 (×3): 5 [IU] via SUBCUTANEOUS
  Administered 2015-06-17: 8 [IU] via SUBCUTANEOUS
  Administered 2015-06-17 – 2015-06-18 (×2): 11 [IU] via SUBCUTANEOUS

## 2015-06-12 NOTE — H&P (Signed)
Physical Medicine and Rehabilitation Admission H&P    Chief Complaint  Patient presents with  . 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     HPI:  [name] is a 49 y.o. female with h/o DM, vaginal cancer, chronic LLE infection who was admitted on 06/02/15 with gangrenous changes with necrotizing fascitis left foot and septic shock. She was intubated for acute respiratory failure and was started on IV antibiotics for treatment. She underwent left [place] amputation the same day for severe infection followed by BKA on 07/04. She required multiple I and D on 07/07 and 07/09 with closure of wound and Placement of antibiotic beads. Blood cultures were positive for Staph aureus and strep Viridans. Dr. Megan Salon consulted for input and recommended IV cefazolin with TEE to help determine duration of antibiotic regimen. 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  PICC placed today and patient to continue at least three additional weeks of IV antibiotics (therapy to be determined by wound healing) per ID.  Therapy ongoing and CIR recommended by MD and Rehab team for follow up therapy.  Review of Systems  HENT: Negative for hearing loss.   Eyes: Negative for blurred vision and double vision.  Respiratory: Positive for cough (intermittent).   Cardiovascular: Negative for chest pain and palpitations.  Gastrointestinal: Positive for abdominal pain (due to bloating/distension) and diarrhea (past meals--new since surgery). Negative for heartburn and nausea.       Anal discomfort due to burning/itching?  Genitourinary: Negative for dysuria, urgency and frequency.       Bowel incontinence.   Musculoskeletal: Negative for myalgias, back pain and joint pain.  Neurological: Positive for sensory change (numbness lower abdominal area and rectal area). Negative for dizziness, tingling, focal weakness and headaches.  Psychiatric/Behavioral: The patient  is nervous/anxious.     Past Medical History  Diagnosis Date  . Diabetes mellitus   . 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 Janie Morning), Cysto performed by Dr. Jonna Munro 12/2009 with evac of clots and bx and fulguration. // H/O stage 2 SCC of the vulva  . Anemia     2/2 blood loss  . Diabetes mellitus type 2, uncontrolled DX: 2001  . 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 Diabetic foot ulcers   . Cataract   . Diabetes mellitus without complication     Past Surgical History  Procedure Laterality Date  . Radical wide local excision of the vulva and right nguinal lymph node dissection  10/2003    Dr. Fermin Schwab  . Uterine dilatation and currettage    . Labial mass excision  07/2003    Dr. Ree Edman  . Left second toe mtp joint amputation  12/19/2010    Dr. Mayer Camel  . Irrigation and debridement of left foot with removal of left  01/29/2011    Dr. Mayer Camel  . Tubal ligation    . I&d extremity Left 06/02/2015    Procedure: IRRIGATION AND DEBRIDEMENT EXTREMITY;  Surgeon: Leandrew Koyanagi, MD;  Location: [facility];  Service: Orthopedics;  Laterality: Left;  . Amputation Left 06/02/2015    Procedure: Partial Left  AMPUTATION FOOT;  Surgeon: Leandrew Koyanagi, MD;  Location: [facility];  Service: Orthopedics;  Laterality: Left;  .  I&d extremity Left 06/05/2015    Procedure: IRRIGATION AND DEBRIDEMENT LEFT LEG, POSSIBLE CLOSURE BELOW KNEE AMPUTATION;  Surgeon: Leandrew Koyanagi, MD;  Location: [facility];  Service: Orthopedics;  Laterality: Left;  . Amputation Left 06/03/2015    Procedure: Left Transtibial amputation,application of wound vac;  Surgeon: Leandrew Koyanagi, MD;  Location: [facility];  Service: Orthopedics;  Laterality: Left;  . I&d extremity Left 06/08/2015    Procedure: IRRIGATION AND DEBRIDEMENT  LEFT LEG, WITH  CLOSURE OF BKA;  Surgeon: Leandrew Koyanagi, MD;  Location: [place];  Service: Orthopedics;  Laterality: Left;  . Tee without cardioversion N/A 06/11/2015    Procedure: TRANSESOPHAGEAL ECHOCARDIOGRAM (TEE);  Surgeon: Lelon Perla, MD;  Location: [facility];  Service: Cardiovascular;  Laterality: N/A;    Family History  Problem Relation Age of Onset  . Diabetes Mother   . Diabetes Father   . Diabetes Brother     Social History: Lives with husband. Independent PTA. Husband works days. She reports that she has never smoked. She has never used smokeless tobacco. She reports that she does not drink alcohol or use illicit drugs.   Allergies: No Known Allergies    Medications Prior to Admission  Medication Sig Dispense Refill  . insulin aspart (NOVOLOG) 100 UNIT/ML injection Inject 3 Units into the skin 3 (three) times daily before meals. (Patient taking differently: Inject 18 Units into the skin 4 (four) times daily - after meals and at bedtime. ) 10 mL 2  . insulin glargine (LANTUS) 100 UNIT/ML injection Inject 0.24 mLs (24 Units total) into the skin at bedtime. (Patient taking differently: Inject 16 Units into the skin at bedtime. ) 10 mL 2  . atorvastatin (LIPITOR) 20 MG tablet Take 1 tablet (20 mg total) by mouth daily. (Patient not taking: Reported on 03/20/2015) 30 tablet 3  . Blood Glucose Monitoring Suppl (RELION CONFIRM GLUCOSE MONITOR) W/DEVICE KIT 1 kit by Does not apply route 3 (three) times daily. 1 kit 2  . doxycycline (VIBRA-TABS) 100 MG tablet Take 1 tablet (100 mg total) by mouth every 12 (twelve) hours. (Patient not taking: Reported on 06/01/2015) 20 tablet 0  . glucose monitoring kit (FREESTYLE) monitoring kit 1 each by Does not apply route 4 (four) times daily - after meals and at bedtime. 1 month Diabetic Testing Supplies for QAC-QHS accuchecks. (Patient not taking: Reported on 03/20/2015) 1 each 1  . oxyCODONE-acetaminophen (PERCOCET) 5-325 MG per tablet Take 1-2  tablets by mouth every 6 (six) hours as needed. (Patient not taking: Reported on 03/20/2015) 15 tablet 0  . Vitamin D, Ergocalciferol, (DRISDOL) 50000 UNITS CAPS capsule Take 1 capsule (50,000 Units total) by mouth every 7 (seven) days. (Patient not taking: Reported on 03/20/2015) 12 capsule 0    Home: Home Living Family/patient expects to be discharged to:: Private residence Living Arrangements: Spouse/significant other Available Help at Discharge: Family, Available 24 hours/day Type of Home: House Home Access: Stairs to enter Technical brewer of Steps: 5 Entrance Stairs-Rails: Left Home Layout: One level Bathroom Shower/Tub: Chiropodist: Handicapped height Home Equipment: Bedside commode   Functional History: Prior Function Level of Independence: Independent  Functional Status:  Mobility: Bed Mobility Overal bed mobility: Needs Assistance Bed Mobility: Supine to Sit Supine to sit: Min guard General bed mobility comments: Close guard for safety. Requires extra time. Able to perform without physical assist. Transfers Overall transfer level: Needs assistance Equipment used: Rolling walker (2 wheeled) Transfers: Sit to/from Stand Sit to Stand: Min  assist Stand pivot transfers: Min assist General transfer comment: Requires Mod cues for hand placement. Stood from [employer], from chair x 3. Assist for steadying. Transferred to chair post ambulation bout. Ambulation/Gait Ambulation/Gait assistance: Min assist Ambulation Distance (Feet): 20 Feet (+ 15' + 25') Assistive device: Rolling walker (2 wheeled) Gait Pattern/deviations: Step-to pattern (hop to gait pattern) General Gait Details: Pt fatigues easily requiring 3 seated rest breaks. Very slow gait. No knee buckling noted.  Gait velocity: decreased Gait velocity interpretation: <1.8 ft/sec, indicative of risk for recurrent falls    ADL: ADL Overall ADL's : Needs assistance/impaired Grooming: Wash/dry  hands, Wash/dry face, Sitting, Set up, Supervision/safety Upper Body Bathing: Supervision/ safety, Set up, Sitting Lower Body Bathing: Maximal assistance Upper Body Dressing : Supervision/safety, Set up, Sitting Lower Body Dressing: Total assistance Toilet Transfer: Minimal assistance, RW, Ambulation, BSC Toilet Transfer Details (indicate cue type and reason): unable due to pain, per PT pt is mod A with SPT Toileting- Clothing Manipulation and Hygiene: Minimal assistance, Sit to/from stand Functional mobility during ADLs: Minimal assistance General ADL Comments: Pt currently min assist level for transfers and for dynamic standing balance with use of the RW.  She was able to stand and alternate reaching for cup on her bedside table with min guard assist for balance.  Demonstrates limited endurance for standing tasks and with functional mobiilty.    Cognition: Cognition Overall Cognitive Status: Within Functional Limits for tasks assessed Orientation Level: Oriented X4 Cognition Arousal/Alertness: Awake/alert Behavior During Therapy: WFL for tasks assessed/performed Overall Cognitive Status: Within Functional Limits for tasks assessed Difficult to assess due to:  (pt. with limited understanding of English)   Blood pressure 117/63, pulse 86, temperature 98.2 F (36.8 C), temperature source Oral, resp. rate 16, height 4' 11"  (1.499 m), weight 52.5 kg (115 lb 11.9 oz), last menstrual period 05/12/2013, SpO2 100 %. Physical Exam Nursing note and vitals reviewed. Constitutional: She is oriented to person, place, and time. She appears well-developed and well-nourished.   HENT:  Head: Normocephalic and atraumatic.  Eyes: Conjunctivae are normal. Pupils are equal, round, and reactive to light.  Neck: Normal range of motion. Neck supple.  Cardiovascular: Normal rate and regular rhythm.   Respiratory: Effort normal and breath sounds normal. No respiratory distress. She has no wheezes.  GI:  Soft. Bowel sounds are normal. She exhibits no distension. There is tenderness.  Musculoskeletal: She exhibits no tenderness.  L-BKA with dry compressive dressing.   Neurological: She is alert and oriented to person, place, and time.  No dysarthria or slurring noted. Able to follow basic motor commands without difficulty. Moves BUE, RLE and Left residual limb without difficulty. UE 4/5. RLE 3/5 HF,KE and 4/5 adf/ LLE: HF 3/5. No sensory deficits.  Skin: Skin is warm and dry.  Psychiatric: She has a normal mood and affect. Her speech is normal and behavior is normal.     Results for orders placed or performed during the hospital encounter of 06/01/15 (from the past 48 hour(s))  Glucose, capillary     Status: Abnormal   Collection Time: 06/10/15  4:57 PM  Result Value Ref Range   Glucose-Capillary 213 (H) 65 - 99 mg/dL  Glucose, capillary     Status: Abnormal   Collection Time: 06/10/15  8:44 PM  Result Value Ref Range   Glucose-Capillary 225 (H) 65 - 99 mg/dL  Glucose, capillary     Status: Abnormal   Collection Time: 06/11/15  7:42 AM  Result Value Ref Range  Glucose-Capillary 207 (H) 65 - 99 mg/dL  Glucose, capillary     Status: Abnormal   Collection Time: 06/11/15 11:43 AM  Result Value Ref Range   Glucose-Capillary 130 (H) 65 - 99 mg/dL  Glucose, capillary     Status: Abnormal   Collection Time: 06/11/15  5:10 PM  Result Value Ref Range   Glucose-Capillary 134 (H) 65 - 99 mg/dL  Glucose, capillary     Status: Abnormal   Collection Time: 06/11/15  9:03 PM  Result Value Ref Range   Glucose-Capillary 176 (H) 65 - 99 mg/dL  CBC     Status: Abnormal   Collection Time: 06/12/15  5:45 AM  Result Value Ref Range   WBC 7.7 4.0 - 10.5 K/uL   RBC 2.73 (L) 3.87 - 5.11 MIL/uL   Hemoglobin 7.9 (L) 12.0 - 15.0 g/dL   HCT 24.3 (L) 36.0 - 46.0 %   MCV 89.0 78.0 - 100.0 fL   MCH 28.9 26.0 - 34.0 pg   MCHC 32.5 30.0 - 36.0 g/dL   RDW 13.7 11.5 - 15.5 %   Platelets 540 (H) 150 - 400  K/uL  Basic metabolic panel     Status: Abnormal   Collection Time: 06/12/15  5:45 AM  Result Value Ref Range   Sodium 137 135 - 145 mmol/L   Potassium 3.5 3.5 - 5.1 mmol/L   Chloride 103 101 - 111 mmol/L   CO2 27 22 - 32 mmol/L   Glucose, Bld 242 (H) 65 - 99 mg/dL   BUN 6 6 - 20 mg/dL   Creatinine, Ser 0.55 0.44 - 1.00 mg/dL   Calcium 8.0 (L) 8.9 - 10.3 mg/dL   GFR calc non Af Amer >60 >60 mL/min   GFR calc Af Amer >60 >60 mL/min    Comment: (NOTE) The eGFR has been calculated using the CKD EPI equation. This calculation has not been validated in all clinical situations. eGFR's persistently <60 mL/min signify possible Chronic Kidney Disease.    Anion gap 7 5 - 15  Glucose, capillary     Status: Abnormal   Collection Time: 06/12/15  7:46 AM  Result Value Ref Range   Glucose-Capillary 234 (H) 65 - 99 mg/dL  Glucose, capillary     Status: Abnormal   Collection Time: 06/12/15 12:03 PM  Result Value Ref Range   Glucose-Capillary 182 (H) 65 - 99 mg/dL   No results found.     Medical Problem List and Plan: 1. 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  DVT Prophylaxis/Anticoagulation: Pharmaceutical: Lovenox 3. Pain Management: Oxycodone prn.  4. Mood: Team to provide ego support. LCSW to follow for evaluation and support.  5. Neuropsych: This patient is capable of making decisions on her own behalf. 6. Skin/Wound Care: Routine pressure relief measures. Monitor wound for healing.  7. Fluids/Electrolytes/Nutrition: Monitor I/O. Check lytes in am.  8. Acute on Chronic iron deficiency anemia: Baseline Hgb- 9.9. Will add iron supplement.  Asymptomatic at this time. Recheck in am and transfuse prn symptoms or drop < 7.0 9. DM type 2: Will monitor BS ac/hs. Continue lantus and titrated to home dose as indicated. Will use SSI for elevated BS.  10. MSSA bacteremia with SIRS reaction: Continue IV ancef for 3 additional weeks.  Day # 8/28 11. Protein malnutrition: Will add protein  supplement.  12. Diarrhea with incontinence: No abdominal distension noted. Will check stool for C diff. Add probiotic and bulking agent.  Check KUB to rule out obstipation.  Post Admission Physician Evaluation: 1. Functional deficits secondary  to L-BKA 2. Patient is admitted to receive collaborative, interdisciplinary care between the physiatrist, rehab nursing staff, and therapy team. 3. Patient's level of medical complexity and substantial therapy needs in context of that medical necessity cannot be provided at a lesser intensity of care such as a SNF. 4. Patient has experienced substantial functional loss from his/her baseline which was documented above under the "Functional History" and "Functional Status" headings.  Judging by the patient's diagnosis, physical exam, and functional history, the patient has potential for functional progress which will result in measurable gains while on inpatient rehab.  These gains will be of substantial and practical use upon discharge  in facilitating mobility and self-care at the household level. 5. Physiatrist will provide 24 hour management of medical needs as well as oversight of the therapy plan/treatment and provide guidance as appropriate regarding the interaction of the two. 6. 24 hour rehab nursing will assist with bladder management, bowel management, safety, skin/wound care, disease management, medication administration, pain management and patient education  and help integrate therapy concepts, techniques,education, etc. 7. PT will assess and treat for/with: Lower extremity strength, range of motion, stamina, balance, functional mobility, safety, adaptive techniques and equipment, pain mgt, pre-prosthetic ed.   Goals are: mod I. 8. OT will assess and treat for/with: ADL's, functional mobility, safety, upper extremity strength, adaptive techniques and equipment, pain mgt, wound care, ego support.   Goals are: mod I. Therapy may not yet proceed with  showering this patient. 9. SLP will assess and treat for/with: n/a.  Goals are: n/a. 10. Case Management and Social Worker will assess and treat for psychological issues and discharge planning. 11. Team conference will be held weekly to assess progress toward goals and to determine barriers to discharge. 12. Patient will receive at least 3 hours of therapy per day at least 5 days per week. 13. ELOS: 7 days       14. Prognosis:  excellent. Will need a translator for therapy.     Meredith Staggers, MD, [facility] 06/12/2015   06/12/2015

## 2015-06-12 NOTE — Progress Notes (Signed)
Inpatient Rehabilitation  I met with the patient at the bedside, and through [facility]  interpreter Lesle Chris, discussed pt's need for post acute rehab and possible venues.  I explained our IP Rehab program and provided She indicates she would like to come to CIR for her rehab.  I have discussed with Dr. [place] Pink and he has given clearance for pt. to admit to rehab today once her PICC line is in.  I have discussed with pt's RN Jacqulyn Bath and she has requested that IV team place PICC line by 2pm if possible.  I will meet with patient and Graciela again at 3pm to go over paperwork for rehab admission.  Please call if questions.  [place] Admissions Coordinator Cell [phone] Office [phone]

## 2015-06-12 NOTE — Progress Notes (Signed)
Physical Therapy Treatment Patient Details Name: [name] MRN: [id number] DOB: 1966/06/21 Today's Date: 06/12/2015    History of Present Illness 49 y.o. 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 IRRIGATION AND DEBRIDEMENT LEFT LEG, WITH CLOSURE OF BKA 7/9.    PT Comments    Pt con't to fatigue quickly but was able to increase ambulation tolerance with max encouragement. Con't to recommend CIR for maximal rehab potential.  Follow Up Recommendations  CIR     Equipment Recommendations  Other (comment)    Recommendations for Other Services Rehab consult;OT consult     Precautions / Restrictions Precautions Precautions: Fall Restrictions Weight Bearing Restrictions: Yes LLE Weight Bearing: Non weight bearing    Mobility  Bed Mobility   Bed Mobility: Supine to Sit     Supine to sit: Supervision;HOB elevated     General bed mobility comments: pt sitting EOB  Transfers Overall transfer level: Needs assistance Equipment used: Rolling walker (2 wheeled) Transfers: Sit to/from Stand Sit to Stand: Min assist         General transfer comment: v/c's for safe hand placement  Ambulation/Gait Ambulation/Gait assistance: Min assist Ambulation Distance (Feet): 20 Feet Assistive device: Rolling walker (2 wheeled) Gait Pattern/deviations: Step-to pattern Gait velocity: decreased Gait velocity interpretation: <1.8 ft/sec, indicative of risk for recurrent falls General Gait Details: pt easily fatigues requiring max encouragement to increased ambulation tolerance. pt then with stool incontinence limiting further ambulation   Stairs            Wheelchair Mobility    Modified Rankin (Stroke Patients Only)       Balance Overall balance assessment: Needs assistance   Sitting balance-Leahy Scale: Good Sitting  balance - Comments: Pt with increased lean to the right side in sitting when attempting UE exercises.  Able to self correct with min subtle cueing.   Standing balance support: Single extremity supported Standing balance-Leahy Scale: Poor Standing balance comment: pt able to stand with unilateral UE to perform peri-care s/p BM however required minA to maintain balance and complete hygiene                    Cognition Arousal/Alertness: Awake/alert Behavior During Therapy: WFL for tasks assessed/performed Overall Cognitive Status: Within Functional Limits for tasks assessed                      Exercises General Exercises - Upper Extremity Shoulder Flexion: 20 reps;Strengthening;Both;Theraband;Seated Theraband Level (Shoulder Flexion): Level 1 (Yellow) Shoulder Horizontal ABduction: Strengthening;Both;20 reps;Seated;Theraband Theraband Level (Shoulder Horizontal Abduction): Level 1 (Yellow) Elbow Extension: Strengthening;20 reps;Theraband Theraband Level (Elbow Extension): Level 1 (Yellow) General Exercises - Lower Extremity Long Arc Quad: Left;10 reps;Seated    General Comments General comments (skin integrity, edema, etc.): pt unaware of loose stool. pt's residual L Limb re-wrapped due to stool on bandage and inappropriate support      Pertinent Vitals/Pain Pain Assessment: No/denies pain Faces Pain Scale: No hurt    Home Living                      Prior Function            PT Goals (current goals can now be found in the care plan section) Progress towards PT goals: Progressing toward goals    Frequency  Min 5X/week    PT Plan Current plan remains  appropriate    Co-evaluation             End of Session           Time: 6886-4847 PT Time Calculation (min) (ACUTE ONLY): 25 min  Charges:  $Gait Training: 8-22 mins $Therapeutic Activity: 8-22 mins                    G Codes:      Kingsley Callander 06/12/2015, 3:36 PM    Kittie Plater, PT, DPT Pager #: [phone] Office #: [phone]

## 2015-06-12 NOTE — PMR Pre-admission (Signed)
PMR Admission Coordinator Pre-Admission Assessment  Patient: [name] is an 49 y.o., female MRN: [id number] DOB: [birth year] Height: 4\' 11"  (149.9 cm) Weight: 52.5 kg (115 lb 11.9 oz)              Insurance Information HMO:     PPO:      PCP:      IPA:      80/20:      OTHER:  PRIMARY:  Self pay      Policy#:       Subscriber:  CM Name:       Phone#:      Fax#:  Pre-Cert#:       Employer:  Benefits:  Phone #:      Name:  Eff. Date:      Deduct:       Out of Pocket Max:       Life Max:  CIR:       SNF:  Outpatient:      Co-Pay:  Home Health:       Co-Pay:   Medicaid Application Date:       Case Manager:  Disability Application Date:       Case Worker:   Emergency [place]   [phone]  Current Medical History  Patient Admitting Diagnosis: left BKA History of Present Illness:    [name] is a 49 y.o. female with h/o DM, vaginal cancer, chronic LLE infection who was admitted on 06/02/15 with gangrenous changes with necrotizing fascitis left foot and septic shock. She was intubated for acute respiratory failure and was started on IV antibiotics for treatment. She underwent left [place] amputation the same day for severe infection followed by BKA on 07/04. She required multiple I and D on 07/07 and 07/09 with closure of wound and Placement of antibiotic beads. Blood cultures were positive for Staph aureus and strep Viridans. Dr. Megan Salon consulted for input and recommended IV cefazolin with TEE to help determine duration of antibiotic regimen. 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 PICC placed today   Therapy ongoing and CIR recommended by MD and Rehab team for follow up therapy.      Past Medical History  Past Medical History  Diagnosis Date  . Diabetes mellitus   . 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 Janie Morning), Cysto performed by Dr. Jonna Munro 12/2009 with evac of clots and bx and fulguration. // H/O stage 2 SCC of the vulva  . Anemia     2/2 blood loss  . Diabetes mellitus type 2, uncontrolled DX: 2001  . 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 Diabetic foot ulcers   . Cataract   . Diabetes mellitus without complication     Family History  family history includes Diabetes in her brother, father, and mother.  Prior Rehab/Hospitalizations:  Has the patient had major surgery during 100 days prior to admission? No  Current Medications   Current facility-administered medications:  .  0.9 %  sodium chloride infusion, 250 mL, Intravenous, PRN, Brand Males, MD, Last Rate: 20 mL/hr at 06/10/15 2150, 20 mL/hr at 06/10/15 2150 .  acetaminophen (TYLENOL) suppository 650 mg, 650 mg, Rectal, Q6H PRN, Remo Lipps  Baker Janus, MD, 650 mg at 06/03/15 1742 .  acetaminophen (TYLENOL) tablet 650 mg, 650 mg, Oral, Q6H PRN, Wilhelmina Mcardle, MD, 650 mg at 06/08/15 1113 .  antiseptic oral rinse (CPC / CETYLPYRIDINIUM CHLORIDE 0.05%) solution 7 mL, 7 mL, Mouth Rinse, BID, Wilhelmina Mcardle, MD, 7 mL at 06/12/15 0952 .  bisacodyl (DULCOLAX) suppository 10 mg, 10 mg, Rectal, Daily PRN, Wilhelmina Mcardle, MD .  ceFAZolin (ANCEF) IVPB 2 g/50 mL premix, 2 g, Intravenous, 3 times per day, Michel Bickers, MD, 2 g at 06/12/15 1346 .  feeding supplement (PRO-STAT SUGAR FREE 64) liquid 30 mL, 30 mL, Oral, BID, Jenifer A Williams, RD, 30 mL at 06/12/15 0951 .  insulin aspart (novoLOG) injection 0-15 Units, 0-15 Units, Subcutaneous, TID WC, Wilhelmina Mcardle, MD, 3 Units at 06/12/15 1303 .  insulin aspart (novoLOG) injection 0-5 Units, 0-5 Units, Subcutaneous, QHS, Wilhelmina Mcardle, MD, 2 Units at 06/10/15 2152 .  insulin glargine (LANTUS) injection 5 Units, 5 Units,  Subcutaneous, QHS, Rise Patience, MD, 5 Units at 06/11/15 2226 .  multivitamin with minerals tablet 1 tablet, 1 tablet, Oral, Daily, Jenifer A Williams, RD, 1 tablet at 06/12/15 0951 .  ondansetron (ZOFRAN) injection 4 mg, 4 mg, Intravenous, Q8H PRN, Elberta Leatherwood, MD, 4 mg at 06/08/15 0841 .  oxyCODONE (Oxy IR/ROXICODONE) immediate release tablet 5-10 mg, 5-10 mg, Oral, Q4H PRN, Grace Bushy Minor, NP, 10 mg at 06/10/15 2200 .  RESOURCE THICKENUP CLEAR, , Oral, PRN, Raylene Miyamoto, MD .  [place], , Oral, PRN, Raylene Miyamoto, MD .  sodium chloride 0.9 % injection 10-40 mL, 10-40 mL, Intracatheter, Q12H, Bonnielee Haff, MD, 10 mL at 06/12/15 1340  Patients Current Diet: Diet heart healthy/carb modified Room service appropriate?: Yes; Fluid consistency:: Thin  Precautions / Restrictions Precautions Precautions: Fall Restrictions Weight Bearing Restrictions: Yes LLE Weight Bearing: Non weight bearing   Has the patient had 2 or more falls or a fall with injury in the past year?No  Prior Activity Level Community (5-7x/wk): Pt. reports she drives and goes out of the apartment most days.  She does the shopping and transports her friends who don't drive.    Home Assistive Devices / Equipment Home Assistive Devices/Equipment: Eyeglasses, CBG Meter Home Equipment: Bedside commode  Prior Device Use: Indicate devices/aids used by the patient prior to current illness, exacerbation or injury? None of the above.  Pt. Did not use a device PTA  Prior Functional Level Prior Function Level of Independence: Independent  Self Care: Did the patient need help bathing, dressing, using the toilet or eating?  Independent  Indoor Mobility: Did the patient need assistance with walking from room to room (with or without device)? Independent  Stairs: Did the patient need assistance with internal or external stairs (with or without device)? Independent  Functional Cognition: Did  the patient need help planning regular tasks such as shopping or remembering to take medications? Independent  Current Functional Level Cognition  Overall Cognitive Status: Within Functional Limits for tasks assessed Difficult to assess due to:  (pt. with limited understanding of English) Orientation Level: Oriented X4    Extremity Assessment (includes Sensation/Coordination)  Upper Extremity Assessment: Generalized weakness  Lower Extremity Assessment: Defer to PT evaluation LLE Deficits / Details: Limited assessment due to pain. Guarding, limited strength as expected post-op LLE: Unable to fully assess due to pain    ADLs  Overall ADL's : Needs assistance/impaired Grooming: Wash/dry hands, Wash/dry face, Sitting,  Set up, Supervision/safety Upper Body Bathing: Supervision/ safety, Set up, Sitting Lower Body Bathing: Maximal assistance Upper Body Dressing : Supervision/safety, Set up, Sitting Lower Body Dressing: Total assistance Toilet Transfer: Minimal assistance, RW, Ambulation, BSC Toilet Transfer Details (indicate cue type and reason): unable due to pain, per PT pt is mod A with SPT Toileting- Clothing Manipulation and Hygiene: Minimal assistance, Sit to/from stand Functional mobility during ADLs: Minimal assistance General ADL Comments: Pt currently min assist level for transfers and for dynamic standing balance with use of the RW.  She was able to stand and alternate reaching for cup on her bedside table with min guard assist for balance.  Demonstrates limited endurance for standing tasks and with functional mobiilty.      Mobility  Overal bed mobility: Needs Assistance Bed Mobility: Supine to Sit Supine to sit: Min guard General bed mobility comments: Close guard for safety. Requires extra time. Able to perform without physical assist.    Transfers  Overall transfer level: Needs assistance Equipment used: Rolling walker (2 wheeled) Transfers: Sit to/from Stand Sit to  Stand: Min assist Stand pivot transfers: Min assist General transfer comment: Requires Mod cues for hand placement. Stood from [employer], from chair x 3. Assist for steadying. Transferred to chair post ambulation bout.    Ambulation / Gait / Stairs / Wheelchair Mobility  Ambulation/Gait Ambulation/Gait assistance: Museum/gallery curator (Feet): 20 Feet (+ 15' + 25') Assistive device: Rolling walker (2 wheeled) Gait Pattern/deviations: Step-to pattern (hop to gait pattern) General Gait Details: Pt fatigues easily requiring 3 seated rest breaks. Very slow gait. No knee buckling noted.  Gait velocity: decreased Gait velocity interpretation: <1.8 ft/sec, indicative of risk for recurrent falls    Posture / Balance Balance Overall balance assessment: Needs assistance Sitting-balance support: Feet supported, No upper extremity supported Sitting balance-Leahy Scale: Good Standing balance support: During functional activity Standing balance-Leahy Scale: Poor Standing balance comment: Needs RW for support.     Special needs/care consideration BiPAP/CPAP  no Continuous Drip IV  no         Oxygen  no       Skin pt. With left BKA bandaging intact                               Bowel mgmt:  Pt. Stated last BM 06/12/15; indicates she has had some stool incontinence Bladder mgmt:  Continent, using BSC Diabetic mgmt  yes     Previous Home Environment Living Arrangements: Spouse/significant other Available Help at Discharge: Family, Available 24 hours/day Type of Home: House Home Layout: One level Home Access: Stairs to enter Entrance Stairs-Rails: Left Entrance Stairs-Number of Steps: 5 Bathroom Shower/Tub: Chiropodist: Handicapped height [place]: No  Discharge Living Setting Plans for Discharge Living Setting: Patient's home Type of Home at Discharge: Apartment Discharge Home Layout: One level Discharge Home Access: Stairs to enter Entrance  Stairs-Rails: None Entrance Stairs-Number of Steps: 4 (back entrance; 3 steps at front but unstable) Discharge Bathroom Shower/Tub: Tub/shower unit Discharge Bathroom Toilet: Standard Discharge Bathroom Accessibility: Yes How Accessible: Accessible via walker Does the patient have any problems obtaining your medications?: Yes (Describe) (pt. states difficulty affording diabetes meds)  Social/Family/Support Systems Patient Roles: Spouse Contact Information: Zenda Alpers Anticipated Caregiver's Contact Information: [phone] Ability/Limitations of Caregiver: Shellee Milo works 5 days per week , is off  2 days during the week (works weekends) Caregiver Availability: Intermittent Discharge Plan  Discussed with Primary Caregiver: No (not present at the hospital) Does Caregiver/Family have Issues with Lodging/Transportation while Pt is in Rehab?: No   Goals/Additional Needs Patient/Family Goal for Rehab: mod I PT/OT; n/a SLP Expected length of stay: 7-8 days Cultural Considerations: Pt. is from [place]  Dietary Needs: heart healthy, carb modified, thins Equipment Needs: TBD Special Service Needs: Will need an interpreter for initial therapy evaluations; interpreter Lesle Chris used for interview portion of this preadmit Pt/Family Agrees to Admission and willing to participate: Yes Program Orientation Provided & Reviewed with Pt/Caregiver Including Roles  & Responsibilities: Yes   Decrease burden of Care through IP rehab admission: no   Possible need for SNF placement upon discharge:  Not anticipated   Patient Condition: This patient's condition remains as documented in the consult dated 06/12/15, in which the Rehabilitation Physician determined and documented that the patient's condition is appropriate for intensive rehabilitative care in an inpatient rehabilitation facility. Will admit to inpatient rehab today.  Preadmission Screen Completed By:  Gerlean Ren, 06/12/2015 2:07  PM ______________________________________________________________________   Discussed status with Dr. Naaman Plummer on 06/12/15 at  1406  and received telephone approval for admission today.  Admission Coordinator:  Gerlean Ren, [id number] 06/12/15

## 2015-06-12 NOTE — Progress Notes (Signed)
Patient ID: [name], female   DOB: 23-Jan-1966, 49 y.o.   MRN: [id number]         [name] for Infectious Disease    Date of Admission:  06/01/2015    Total days of antibiotics 12        Day 8 cefazolin         Principal Problem:   Staphylococcus aureus bacteremia with sepsis Active Problems:   Gangrene of foot   Septic shock   Diabetic ulcer of left foot associated with type 1 diabetes mellitus   Respiratory failure   Severe sepsis   Anemia of chronic disease   Diabetes mellitus type 2, controlled   S/P BKA (below knee amputation) unilateral   . antiseptic oral rinse  7 mL Mouth Rinse BID  .  ceFAZolin (ANCEF) IV  2 g Intravenous 3 times per day  . feeding supplement (PRO-STAT SUGAR FREE 64)  30 mL Oral BID  . insulin aspart  0-15 Units Subcutaneous TID WC  . insulin aspart  0-5 Units Subcutaneous QHS  . insulin glargine  5 Units Subcutaneous QHS  . multivitamin with minerals  1 tablet Oral Daily  . sodium chloride  10-40 mL Intracatheter Q12H    Past Medical History  Diagnosis Date  . Diabetes mellitus   . 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 Janie Townsend), Cysto performed by Dr. Jonna Townsend 12/2009 with evac of clots and bx and fulguration. // H/O stage 2 SCC of the vulva  . Anemia     2/2 blood loss  . Diabetes mellitus type 2, uncontrolled DX: 2001  . 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 Diabetic foot ulcers   . Cataract   . Diabetes mellitus without complication     History  Substance Use Topics  . Smoking status: Never Smoker   . Smokeless tobacco: Never Used  . Alcohol Use: No    Family History  Problem Relation Age of Onset  . Diabetes Mother   . Diabetes Father   . Diabetes Brother    No Known  Allergies  OBJECTIVE: Blood pressure 105/51, pulse 87, temperature 98.4 F (36.9 C), temperature source Oral, resp. rate 16, height 4\' 11"  (1.499 m), weight 115 lb 11.9 oz (52.5 kg), last menstrual period 05/12/2013, SpO2 100 %.   Lab Results Lab Results  Component Value Date   WBC 7.7 06/12/2015   HGB 7.9* 06/12/2015   HCT 24.3* 06/12/2015   MCV 89.0 06/12/2015   PLT 540* 06/12/2015    Lab Results  Component Value Date   CREATININE 0.55 06/12/2015   BUN 6 06/12/2015   NA 137 06/12/2015   K 3.5 06/12/2015   CL 103 06/12/2015   CO2 27 06/12/2015    Lab Results  Component Value Date   ALT 15 06/09/2015   AST 31 06/09/2015   ALKPHOS 89 06/09/2015   BILITOT 0.3 06/09/2015     Microbiology: Recent Results (from the past 240 hour(s))  Culture, blood (routine x 2)     Status: None   Collection Time: 06/05/15  3:05 PM  Result Value Ref Range Status   Specimen Description BLOOD RIGHT HAND  Final   Special Requests BOTTLES DRAWN AEROBIC ONLY 10CC  Final  Culture NO GROWTH 5 DAYS  Final   Report Status 06/10/2015 FINAL  Final  Culture, blood (routine x 2)     Status: None   Collection Time: 06/05/15  6:09 PM  Result Value Ref Range Status   Specimen Description BLOOD RIGHT HAND  Final   Special Requests BOTTLES DRAWN AEROBIC AND ANAEROBIC 10CC  Final   Culture NO GROWTH 5 DAYS  Final   Report Status 06/10/2015 FINAL  Final   TEE: No evidence of endocarditis noted  Assessment: She is improving on therapy for MSSA bacteremia after left BKA for gangrenous infection. Repeat blood cultures have been negative and she has no evidence of endocarditis or other complications. Her BKA stump was debrided and closed on 06/08/2015. Optimal duration of therapy will be dictated by wound healing. I plan on continuing IV cefazolin for now. If she remains hospitalized for inpatient rehabilitation I will monitor her course weekly. If she is discharged I will arrange follow-up in my clinic  within the next 2-3 weeks.  Plan: 1. Continue cefazolin  Hailey Bickers, MD [facility] for [facility] [place] [facility] [phone] pager   [phone] cell 06/12/2015, 2:31 PM

## 2015-06-12 NOTE — Progress Notes (Signed)
Interpreter Lesle Chris for [employer] aplication and [name]

## 2015-06-12 NOTE — Discharge Summary (Signed)
[facility]  Physician Discharge Summary   Patient ID: [name] MRN: [id number] DOB/AGE: September 01, 1966 49 y.o.  Admit date: 06/01/2015 Discharge date: 06/12/2015  PCP: Lorayne Marek, MD  DISCHARGE DIAGNOSES:  Principal Problem:   Staphylococcus aureus bacteremia with sepsis Active Problems:   Septic shock   Gangrene of foot   Diabetic ulcer of left foot associated with type 1 diabetes mellitus   Respiratory failure   Severe sepsis   Anemia of chronic disease   Diabetes mellitus type 2, controlled   S/P BKA (below knee amputation) unilateral   RECOMMENDATIONS FOR OUTPATIENT FOLLOW UP: 1. Patient is being discharged to inpatient rehabilitation 2. Will need 3 weeks of intravenous Ancef 3. Follow-up with infectious diseases in 2 weeks, Dr. Megan Salon   DISCHARGE CONDITION: fair  Diet recommendation: Modified carbohydrate  Filed Weights   06/03/15 0615 06/04/15 0500 06/06/15 0500  Weight: 59.2 kg (130 lb 8.2 oz) 54.4 kg (119 lb 14.9 oz) 52.5 kg (115 lb 11.9 oz)    INITIAL HISTORY: 49 year old female of Hispanic origin with history of poorly controlled diabetes and chronic left lower extremity infection who presented with foot pain and was noted to be hypotensive. She was subsequently found to have gas gangrene of the foot and was emergently taken to surgery. She developed septic shock. She was intubated. She was managed in the ICU.  Consultations:  Orthopedics  Infectious diseases  Critical care medicine  Procedures: Central line placement. Removed on July 13 PICC line placed July 13.  Left leg amputation July 4. Repeat debridement July 5. repeat irrigation and debridement July 9 with closure of BKA.   HOSPITAL COURSE:   Septic shock with MSSA bacteremia secondary to left lower extremity gangrene, status post below-knee amputation Patient was being followed by orthopedics and infectious diseases. Patient was initially in the intensive care  unit intubated. Critical care medicine was following at that time. She was on pressors. Patient was stabilized and then transferred to the floor. Patient was seen by infectious diseases. Initially treated with vancomycin, Zosyn and clindamycin. After final identification and sensitivities were available. She was changed over to Ancef. Discussed with Dr. Megan Salon today. Plan is for 3 more weeks of intravenous Ancef. Patient also underwent TEE which did not reveal any vegetations.  History of diabetes mellitus type 2 Poorly controlled with HbA1c of 11.7. Continue current insulin regimen. Adjust as needed. Maintain blood glucose levels between 120-160.  Normocytic anemia Likely multifactorial with a combination of acute illness, chronic disease, acute blood status surgery. No overt loss noted currently. Continue to monitor hemoglobin periodically.  Candida vulvovaginitis Completed one day of Diflucan on July 4. Currently on clotrimazole vaginal cream.  History of stage IV vaginal cancer Follow-up as outpatient with oncology.  Remains stable. Okay for discharge to inpatient rehabilitation.    PERTINENT LABS:  The results of significant diagnostics from this hospitalization (including imaging, microbiology, ancillary and laboratory) are listed below for reference.    Microbiology: Recent Results (from the past 240 hour(s))  Culture, blood (routine x 2)     Status: None   Collection Time: 06/05/15  3:05 PM  Result Value Ref Range Status   Specimen Description BLOOD RIGHT HAND  Final   Special Requests BOTTLES DRAWN AEROBIC ONLY 10CC  Final   Culture NO GROWTH 5 DAYS  Final   Report Status 06/10/2015 FINAL  Final  Culture, blood (routine x 2)     Status: None   Collection Time: 06/05/15  6:09 PM  Result  Value Ref Range Status   Specimen Description BLOOD RIGHT HAND  Final   Special Requests BOTTLES DRAWN AEROBIC AND ANAEROBIC 10CC  Final   Culture NO GROWTH 5 DAYS  Final   Report Status  06/10/2015 FINAL  Final     Labs: Basic Metabolic Panel:  Recent Labs Lab 06/06/15 0509 06/07/15 0402 06/08/15 0322 06/09/15 2025 06/12/15 0545  NA 141 136 136 135 137  K 3.1* 4.0 3.8 3.6 3.5  CL 102 101 100* 98* 103  CO2 31 29 30 29 27   GLUCOSE 116* 274* 294* 180* 242*  BUN <5* 6 9 <5* 6  CREATININE 0.47 0.61 0.57 0.52 0.55  CALCIUM 7.5* 7.4* 7.6* 8.2* 8.0*  MG  --   --  2.0  --   --   PHOS  --   --  3.3  --   --    Liver Function Tests:  Recent Labs Lab 06/09/15 2025  AST 31  ALT 15  ALKPHOS 89  BILITOT 0.3  PROT 6.7  ALBUMIN 2.0*   CBC:  Recent Labs Lab 06/06/15 0509 06/08/15 0322 06/09/15 2025 06/12/15 0545  WBC 10.6* 7.2 10.9* 7.7  NEUTROABS  --   --  8.4*  --   HGB 8.4* 8.0* 8.7* 7.9*  HCT 25.2* 24.2* 26.2* 24.3*  MCV 87.8 87.4 88.8 89.0  PLT 366 374 432* 540*   BNP: BNP (last 3 results)  Recent Labs  06/02/15 0415  BNP 194.6*    CBG:  Recent Labs Lab 06/11/15 1143 06/11/15 1710 06/11/15 2103 06/12/15 0746 06/12/15 1203  GLUCAP 130* 134* 176* 234* 182*     IMAGING STUDIES Dg Chest Port 1 View  06/04/2015   CLINICAL DATA:  Respiratory failure.  Assess endotracheal tube  EXAM: PORTABLE CHEST - 1 VIEW  COMPARISON:  06/03/2015  FINDINGS: The endotracheal tube is in good position between the clavicular heads and carina. Stable subclavian central line from the left, tip at the upper cavoatrial junction. The orogastric tube enters the stomach at least.  Normal heart size and mediastinal contours. No acute infiltrate or edema. No effusion or pneumothorax.  IMPRESSION: 1. Tubes and central line remain in good position. 2. Lungs remain clear.   Electronically Signed   By: Monte Fantasia M.D.   On: 06/04/2015 05:52   Dg Chest Port 1 View  06/03/2015   CLINICAL DATA:  Respiratory failure.  EXAM: PORTABLE CHEST - 1 VIEW  COMPARISON:  06/02/2015  FINDINGS: The cardiac silhouette, mediastinal and hilar contours are within normal limits and stable.  The endotracheal tube, NG tube and left subclavian catheters are unchanged. No significant pulmonary findings. No pleural effusion or pneumothorax.  IMPRESSION: 1. Stable support apparatus. 2. No significant pulmonary findings.   Electronically Signed   By: Marijo Sanes M.D.   On: 06/03/2015 07:09   Dg Chest Port 1 View  06/02/2015   CLINICAL DATA:  Septic shock.  History of vaginal cancer.  EXAM: PORTABLE CHEST - 1 VIEW  COMPARISON:  06/02/2015  FINDINGS: Support devices are in stable position. Heart is borderline in size. No confluent airspace opacities or effusions. No acute bony abnormality.  IMPRESSION: No acute findings.  No change.  Stable support devices.   Electronically Signed   By: Rolm Baptise M.D.   On: 06/02/2015 08:06   Dg Chest Port 1 View  06/02/2015   CLINICAL DATA:  Central line placement  EXAM: PORTABLE CHEST - 1 VIEW  COMPARISON:  06/01/2015  FINDINGS: The  endotracheal tube is 2 cm above the carina. The nasogastric tube extends into the stomach. There is a left subclavian central line extending to the low SVC. There is no pneumothorax. The lungs are clear. There is no large effusion.  IMPRESSION: Support equipment appears satisfactorily positioned.  The lungs are grossly clear.  No pneumothorax.   Electronically Signed   By: Andreas Newport M.D.   On: 06/02/2015 06:39   Dg Chest Port 1 View  06/01/2015   CLINICAL DATA:  Sepsis  EXAM: PORTABLE CHEST - 1 VIEW  COMPARISON:  06/27/2014  FINDINGS: A single AP portable view of the chest demonstrates no focal airspace consolidation or alveolar edema. The lungs are grossly clear. There is no large effusion or pneumothorax. Cardiac and mediastinal contours appear unremarkable.  IMPRESSION: No active disease.   Electronically Signed   By: Andreas Newport M.D.   On: 06/01/2015 23:00   Ap / Lateral X-ray Left Foot  06/01/2015   CLINICAL DATA:  Pain and swelling of the foot with discoloration and open wounds.  EXAM: LEFT FOOT - 2 VIEW   COMPARISON:  03/20/2015  FINDINGS: There is prior transmetatarsal amputation of the second digit.  There is extensive soft tissue gas throughout the forefoot, midfoot and hindfoot with marked soft tissue swelling. There is no radiopaque foreign body. There is no frank bony destruction.  IMPRESSION: Extensive soft tissue gas throughout the foot. Soft tissue gas appears to be tracking up the lower leg as well, at the margin of the AP image.   Electronically Signed   By: Andreas Newport M.D.   On: 06/01/2015 23:12    DISCHARGE EXAMINATION: Filed Vitals:   06/11/15 1550 06/11/15 1555 06/11/15 2105 06/12/15 0553  BP: 108/49  100/51 117/63  Pulse: 86 79 85 86  Temp:   98.5 F (36.9 C) 98.2 F (36.8 C)  TempSrc:   Oral Oral  Resp: 17 13 16 16   Height:      Weight:      SpO2: 94% 91% 100% 100%   General appearance: alert, cooperative, appears stated age and no distress Resp: clear to auscultation bilaterally Cardio: regular rate and rhythm, S1, S2 normal, no murmur, click, rub or gallop GI: soft, non-tender; bowel sounds normal; no masses,  no organomegaly  DISPOSITION: To [facility] rehabilitation   ALLERGIES: No Known Allergies  Current Inpatient Medications:  Scheduled: . antiseptic oral rinse  7 mL Mouth Rinse BID  .  ceFAZolin (ANCEF) IV  2 g Intravenous 3 times per day  . feeding supplement (PRO-STAT SUGAR FREE 64)  30 mL Oral BID  . insulin aspart  0-15 Units Subcutaneous TID WC  . insulin aspart  0-5 Units Subcutaneous QHS  . insulin glargine  5 Units Subcutaneous QHS  . multivitamin with minerals  1 tablet Oral Daily  . sodium chloride  10-40 mL Intracatheter Q12H   Continuous:  JOA:CZYSAY chloride, acetaminophen, acetaminophen, bisacodyl, ondansetron (ZOFRAN) IV, oxyCODONE, RESOURCE THICKENUP CLEAR, [place]  Final discharge medication list to be outlined when patient is discharged from inpatient rehabilitation.   Follow-up Information    Follow up  with Marianna Payment, MD In 2 weeks.   Specialty:  Orthopedic Surgery   Why:  For suture removal, For wound re-check   Contact information:   [address] [place]  [address] [phone]       TOTAL DISCHARGE TIME: 35 minutes  [facility] Pager [phone]  06/12/2015, 1:41 PM

## 2015-06-12 NOTE — Progress Notes (Signed)
Gerlean Ren Rehab Admission Coordinator Signed Physical Medicine and Rehabilitation PMR Pre-admission 06/12/2015 12:08 PM  Related encounter: ED to Hosp-Admission (Current) from 06/01/2015 in [place] Collapse All   PMR Admission Coordinator Pre-Admission Assessment  Patient: [name] is an 49 y.o., female MRN: [id number] DOB: 04-Dec-1965 Height: 4\' 11"  (149.9 cm) Weight: 52.5 kg (115 lb 11.9 oz)  Insurance Information HMO: PPO: PCP: IPA: 80/20: OTHER:  PRIMARY: Self pay Policy#: Subscriber:  CM Name: Phone#: Fax#:  Pre-Cert#: Employer:  Benefits: Phone #: Name:  Eff. Date: Deduct: Out of Pocket Max: Life Max:  CIR: SNF:  Outpatient: Co-Pay:  Home Health: Co-Pay:   Medicaid Application Date: Case Manager:  Disability Application Date: Case Worker:   Emergency [place] [phone]  Current Medical History  Patient Admitting Diagnosis: left BKA History of Present Illness: [name] is a 49 y.o. female with h/o DM, vaginal cancer, chronic LLE infection who was admitted on 06/02/15 with gangrenous changes with necrotizing fascitis left foot and septic shock. She was intubated for acute respiratory failure and was started on IV antibiotics for treatment. She underwent left [place] amputation the same day for severe infection followed by BKA on 07/04. She required multiple I and D on 07/07 and 07/09 with closure of wound and Placement of antibiotic beads. Blood cultures were positive for Staph aureus and strep Viridans. Dr. Megan Salon consulted for input and recommended IV cefazolin with TEE to help determine duration of  antibiotic regimen. 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 PICC placed today   Therapy ongoing and CIR recommended by MD and Rehab team for follow up therapy.      Past Medical History  Past Medical History  Diagnosis Date  . Diabetes mellitus   . 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 Janie Morning), Cysto performed by Dr. Jonna Munro 12/2009 with evac of clots and bx and fulguration. // H/O stage 2 SCC of the vulva  . Anemia     2/2 blood loss  . Diabetes mellitus type 2, uncontrolled DX: 2001  . 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 Diabetic foot ulcers   . Cataract   . Diabetes mellitus without complication     Family History  family history includes Diabetes in her brother, father, and mother.  Prior Rehab/Hospitalizations:  Has the patient had major surgery during 100 days prior to admission? No  Current Medications   Current facility-administered medications:  . 0.9 % sodium chloride infusion, 250 mL, Intravenous, PRN, Brand Males, MD, Last Rate: 20 mL/hr at 06/10/15 2150, 20 mL/hr at 06/10/15 2150 . acetaminophen (TYLENOL) suppository 650 mg, 650 mg, Rectal, Q6H PRN, Anders Simmonds, MD, 650 mg at 06/03/15 1742 . acetaminophen (TYLENOL) tablet 650 mg, 650 mg, Oral, Q6H PRN, Wilhelmina Mcardle, MD, 650 mg at 06/08/15 1113 . antiseptic oral rinse (CPC / CETYLPYRIDINIUM CHLORIDE 0.05%) solution 7 mL, 7 mL, Mouth Rinse, BID, Wilhelmina Mcardle, MD, 7 mL at 06/12/15 0952 . bisacodyl (DULCOLAX) suppository 10 mg, 10 mg, Rectal, Daily PRN, Wilhelmina Mcardle, MD . ceFAZolin (ANCEF) IVPB 2 g/50 mL premix, 2 g, Intravenous, 3 times per day,  Michel Bickers, MD, 2 g  at 06/12/15 1346 . feeding supplement (PRO-STAT SUGAR FREE 64) liquid 30 mL, 30 mL, Oral, BID, Jenifer A Williams, RD, 30 mL at 06/12/15 0951 . insulin aspart (novoLOG) injection 0-15 Units, 0-15 Units, Subcutaneous, TID WC, Wilhelmina Mcardle, MD, 3 Units at 06/12/15 1303 . insulin aspart (novoLOG) injection 0-5 Units, 0-5 Units, Subcutaneous, QHS, Wilhelmina Mcardle, MD, 2 Units at 06/10/15 2152 . insulin glargine (LANTUS) injection 5 Units, 5 Units, Subcutaneous, QHS, Rise Patience, MD, 5 Units at 06/11/15 2226 . multivitamin with minerals tablet 1 tablet, 1 tablet, Oral, Daily, Jenifer A Williams, RD, 1 tablet at 06/12/15 0951 . ondansetron (ZOFRAN) injection 4 mg, 4 mg, Intravenous, Q8H PRN, Elberta Leatherwood, MD, 4 mg at 06/08/15 0841 . oxyCODONE (Oxy IR/ROXICODONE) immediate release tablet 5-10 mg, 5-10 mg, Oral, Q4H PRN, Grace Bushy Minor, NP, 10 mg at 06/10/15 2200 . RESOURCE THICKENUP CLEAR, , Oral, PRN, Raylene Miyamoto, MD . [place], , Oral, PRN, Raylene Miyamoto, MD . sodium chloride 0.9 % injection 10-40 mL, 10-40 mL, Intracatheter, Q12H, Bonnielee Haff, MD, 10 mL at 06/12/15 1340  Patients Current Diet: Diet heart healthy/carb modified Room service appropriate?: Yes; Fluid consistency:: Thin  Precautions / Restrictions Precautions Precautions: Fall Restrictions Weight Bearing Restrictions: Yes LLE Weight Bearing: Non weight bearing   Has the patient had 2 or more falls or a fall with injury in the past year?No  Prior Activity Level Community (5-7x/wk): Pt. reports she drives and goes out of the apartment most days. She does the shopping and transports her friends who don't drive.   Home Assistive Devices / Equipment Home Assistive Devices/Equipment: Eyeglasses, CBG Meter Home Equipment: Bedside commode  Prior Device Use: Indicate devices/aids used by the patient prior to current illness, exacerbation or injury? None of the above. Pt. Did not use  a device PTA  Prior Functional Level Prior Function Level of Independence: Independent  Self Care: Did the patient need help bathing, dressing, using the toilet or eating? Independent  Indoor Mobility: Did the patient need assistance with walking from room to room (with or without device)? Independent  Stairs: Did the patient need assistance with internal or external stairs (with or without device)? Independent  Functional Cognition: Did the patient need help planning regular tasks such as shopping or remembering to take medications? Independent  Current Functional Level Cognition  Overall Cognitive Status: Within Functional Limits for tasks assessed Difficult to assess due to: (pt. with limited understanding of English) Orientation Level: Oriented X4   Extremity Assessment (includes Sensation/Coordination)  Upper Extremity Assessment: Generalized weakness  Lower Extremity Assessment: Defer to PT evaluation LLE Deficits / Details: Limited assessment due to pain. Guarding, limited strength as expected post-op LLE: Unable to fully assess due to pain    ADLs  Overall ADL's : Needs assistance/impaired Grooming: Wash/dry hands, Wash/dry face, Sitting, Set up, Supervision/safety Upper Body Bathing: Supervision/ safety, Set up, Sitting Lower Body Bathing: Maximal assistance Upper Body Dressing : Supervision/safety, Set up, Sitting Lower Body Dressing: Total assistance Toilet Transfer: Minimal assistance, RW, Ambulation, BSC Toilet Transfer Details (indicate cue type and reason): unable due to pain, per PT pt is mod A with SPT Toileting- Clothing Manipulation and Hygiene: Minimal assistance, Sit to/from stand Functional mobility during ADLs: Minimal assistance General ADL Comments: Pt currently min assist level for transfers and for dynamic standing balance with use of the RW. She was able to stand and alternate reaching for cup on her  bedside table with min guard assist for  balance. Demonstrates limited endurance for standing tasks and with functional mobiilty.     Mobility  Overal bed mobility: Needs Assistance Bed Mobility: Supine to Sit Supine to sit: Min guard General bed mobility comments: Close guard for safety. Requires extra time. Able to perform without physical assist.    Transfers  Overall transfer level: Needs assistance Equipment used: Rolling walker (2 wheeled) Transfers: Sit to/from Stand Sit to Stand: Min assist Stand pivot transfers: Min assist General transfer comment: Requires Mod cues for hand placement. Stood from [employer], from chair x 3. Assist for steadying. Transferred to chair post ambulation bout.    Ambulation / Gait / Stairs / Wheelchair Mobility  Ambulation/Gait Ambulation/Gait assistance: Museum/gallery curator (Feet): 20 Feet (+ 15' + 25') Assistive device: Rolling walker (2 wheeled) Gait Pattern/deviations: Step-to pattern (hop to gait pattern) General Gait Details: Pt fatigues easily requiring 3 seated rest breaks. Very slow gait. No knee buckling noted.  Gait velocity: decreased Gait velocity interpretation: <1.8 ft/sec, indicative of risk for recurrent falls    Posture / Balance Balance Overall balance assessment: Needs assistance Sitting-balance support: Feet supported, No upper extremity supported Sitting balance-Leahy Scale: Good Standing balance support: During functional activity Standing balance-Leahy Scale: Poor Standing balance comment: Needs RW for support.     Special needs/care consideration BiPAP/CPAP no Continuous Drip IV no  Oxygen no  Skin pt. With left BKA bandaging intact  Bowel mgmt: Pt. Stated last BM 06/12/15; indicates she has had some stool incontinence Bladder mgmt: Continent, using BSC Diabetic mgmt yes     Previous Home Environment Living Arrangements: Spouse/significant other Available Help at Discharge:  Family, Available 24 hours/day Type of Home: House Home Layout: One level Home Access: Stairs to enter Entrance Stairs-Rails: Left Entrance Stairs-Number of Steps: 5 Bathroom Shower/Tub: Chiropodist: Handicapped Pioneer: No  Discharge Living Setting Plans for Discharge Living Setting: Patient's home Type of Home at Discharge: Apartment Discharge Home Layout: One level Discharge Home Access: Stairs to enter Entrance Stairs-Rails: None Entrance Stairs-Number of Steps: 4 (back entrance; 3 steps at front but unstable) Discharge Bathroom Shower/Tub: Tub/shower unit Discharge Bathroom Toilet: Standard Discharge Bathroom Accessibility: Yes How Accessible: Accessible via walker Does the patient have any problems obtaining your medications?: Yes (Describe) (pt. states difficulty affording diabetes meds)  Social/Family/Support Systems Patient Roles: Spouse Contact Information: Psychologist, occupational Anticipated Caregiver's Contact Information: [phone] Ability/Limitations of Caregiver: Shellee Milo works 5 days per week , is off 2 days during the week (works weekends) Caregiver Availability: Intermittent Discharge Plan Discussed with Primary Caregiver: No (not present at the hospital) Does Caregiver/Family have Issues with Lodging/Transportation while Pt is in Rehab?: No   Goals/Additional Needs Patient/Family Goal for Rehab: mod I PT/OT; n/a SLP Expected length of stay: 7-8 days Cultural Considerations: Pt. is from [place]  Dietary Needs: heart healthy, carb modified, thins Equipment Needs: TBD Special Service Needs: Will need an interpreter for initial therapy evaluations; interpreter Lesle Chris used for interview portion of this preadmit Pt/Family Agrees to Admission and willing to participate: Yes Program Orientation Provided & Reviewed with Pt/Caregiver Including Roles & Responsibilities: Yes   Decrease burden of Care through IP rehab  admission: no   Possible need for SNF placement upon discharge: Not anticipated   Patient Condition: This patient's condition remains as documented in the consult dated 06/12/15, in which the Rehabilitation Physician determined and documented that the patient's condition is appropriate for intensive rehabilitative  care in an inpatient rehabilitation facility. Will admit to inpatient rehab today.  Preadmission Screen Completed By: Gerlean Ren, 06/12/2015 2:07 PM ______________________________________________________________________  Discussed status with Dr. Naaman Plummer on 06/12/15 at 1406 and received telephone approval for admission today.  Admission Coordinator: Gerlean Ren [phone] 06/12/15         Cosigned by: Meredith Staggers, MD at 06/12/2015 2:31 PM

## 2015-06-12 NOTE — Progress Notes (Signed)
Hailey Staggers, [name] Physician Signed Physical Medicine and Rehabilitation Consult Note 06/11/2015 9:39 AM  Related encounter: ED to Hosp-Admission (Current) from 06/01/2015 in [place] Collapse All        Physical Medicine and Rehabilitation Consult   Reason for Consult: Necrotizing fascitis with bacteremia and L-BKA  Referring Physician: Dr. Erlinda Hong.    HPI: [name] is a 49 y.o. female with h/o DM, vaginal cancer, chronic LLE infection who was admitted on 06/02/15 with gangrenous changes with necrotizing fascitis left foot and septic shock. She was intubated for acute respiratory failure and was started on IV antibiotics for treatment. She underwent left [place] amputation the same day for severe infection followed by BKA on 07/04. She required multiple I and D on 07/07 and 07/09 with closure of wound and Placement of antibiotic beads. Blood cultures were positive for Staph aureus and strep Viridans. Dr. Megan Salon consulted for input and recommended IV cefazolin with TEE to help determine duration of antibiotic regimen. Question repeat I and D tomorrow by Dr. Brayton El. Xu ? She was extubated without difficulty and therapy initiated. CIR recommended by [name] and Rehab team for follow up therapy.   Used interpreter line to facilitate communication: Patient was focused on rectal numbness and abdominal numbness as well as difficulty speaking since extubation.   Review of Systems  Unable to perform ROS: language  Respiratory: Negative for cough and sputum production.  Cardiovascular: Negative for chest pain.  Genitourinary: Negative for urgency and frequency.   NO incontinence reported.  Musculoskeletal: Negative for myalgias and neck pain.  Neurological: Positive for sensory change. Negative for focal weakness.  Psychiatric/Behavioral: The patient is nervous/anxious.     Past Medical History  Diagnosis Date  .  Diabetes mellitus   . 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 Janie Morning), Cysto performed by Dr. Jonna Munro 12/2009 with evac of clots and bx and fulguration. // H/O stage 2 SCC of the vulva  . Anemia     2/2 blood loss  . Diabetes mellitus type 2, uncontrolled DX: 2001  . 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 Diabetic foot ulcers   . Cataract   . Diabetes mellitus without complication     Past Surgical History  Procedure Laterality Date  . Radical wide local excision of the vulva and right nguinal lymph node dissection  10/2003    Dr. Fermin Schwab  . Uterine dilatation and currettage    . Labial mass excision  07/2003    Dr. Ree Edman  . Left second toe mtp joint amputation  12/19/2010    Dr. Mayer Camel  . Irrigation and debridement of left foot with removal of left  01/29/2011    Dr. Mayer Camel  . Tubal ligation    . I&d extremity Left 06/02/2015    Procedure: IRRIGATION AND DEBRIDEMENT EXTREMITY; Surgeon: Leandrew Koyanagi, [name]; Location: [facility]; Service: Orthopedics; Laterality: Left;  . Amputation Left 06/02/2015    Procedure: Partial Left AMPUTATION FOOT; Surgeon: Leandrew Koyanagi, [name]; Location: [facility]; Service: Orthopedics; Laterality: Left;  . I&d extremity Left 06/05/2015    Procedure: IRRIGATION AND DEBRIDEMENT LEFT LEG, POSSIBLE CLOSURE BELOW KNEE AMPUTATION; Surgeon: Leandrew Koyanagi, [name]; Location: [place];  Service: Orthopedics; Laterality: Left;  . Amputation Left 06/03/2015    Procedure: Left Transtibial amputation,application of wound vac; Surgeon: Leandrew Koyanagi, [name]; Location: [facility]; Service: Orthopedics; Laterality: Left;  . I&d extremity Left 06/08/2015     Procedure: IRRIGATION AND DEBRIDEMENT LEFT LEG, WITH CLOSURE OF BKA; Surgeon: Leandrew Koyanagi, [name]; Location: [place]; Service: Orthopedics; Laterality: Left;     Family History  Problem Relation Age of Onset  . Diabetes Mother   . Diabetes Father   . Diabetes Brother     Social History: Lives with husband. Independent PTA. Husband works days. She reports that she has never smoked. She has never used smokeless tobacco. She reports that she does not drink alcohol or use illicit drugs.    Allergies: No Known Allergies    Medications Prior to Admission  Medication Sig Dispense Refill  . insulin aspart (NOVOLOG) 100 UNIT/ML injection Inject 3 Units into the skin 3 (three) times daily before meals. (Patient taking differently: Inject 18 Units into the skin 4 (four) times daily - after meals and at bedtime. ) 10 mL 2  . insulin glargine (LANTUS) 100 UNIT/ML injection Inject 0.24 mLs (24 Units total) into the skin at bedtime. (Patient taking differently: Inject 16 Units into the skin at bedtime. ) 10 mL 2  . atorvastatin (LIPITOR) 20 MG tablet Take 1 tablet (20 mg total) by mouth daily. (Patient not taking: Reported on 03/20/2015) 30 tablet 3  . Blood Glucose Monitoring Suppl (RELION CONFIRM GLUCOSE MONITOR) W/DEVICE KIT 1 kit by Does not apply route 3 (three) times daily. 1 kit 2  . doxycycline (VIBRA-TABS) 100 MG tablet Take 1 tablet (100 mg total) by mouth every 12 (twelve) hours. (Patient not taking: Reported on 06/01/2015) 20 tablet 0  . glucose monitoring kit (FREESTYLE) monitoring kit 1 each by Does not apply route 4 (four) times daily - after meals and at bedtime. 1 month Diabetic Testing Supplies for QAC-QHS accuchecks. (Patient not taking: Reported on 03/20/2015) 1 each 1  . oxyCODONE-acetaminophen (PERCOCET) 5-325 MG per tablet Take 1-2 tablets by mouth every 6 (six) hours as needed. (Patient not taking: Reported on 03/20/2015) 15  tablet 0  . Vitamin D, Ergocalciferol, (DRISDOL) 50000 UNITS CAPS capsule Take 1 capsule (50,000 Units total) by mouth every 7 (seven) days. (Patient not taking: Reported on 03/20/2015) 12 capsule 0    Home: Home Living Family/patient expects to be discharged to:: Private residence Living Arrangements: Spouse/significant other Available Help at Discharge: Family, Available 24 hours/day Type of Home: House Home Access: Stairs to enter Technical brewer of Steps: 5 Entrance Stairs-Rails: Left Home Layout: One level Bathroom Shower/Tub: Chiropodist: Handicapped height Home Equipment: Bedside commode  Functional History: Prior Function Level of Independence: Independent Functional Status:  Mobility: Bed Mobility Overal bed mobility: Needs Assistance Bed Mobility: Supine to Sit Supine to sit: Min guard, HOB elevated General bed mobility comments: Close guard for safety. Requires extra time. Able to perform without physical assist. Transfers Overall transfer level: Needs assistance Equipment used: Rolling walker (2 wheeled) Transfers: Sit to/from Stand Sit to Stand: Min assist Stand pivot transfers: Min assist General transfer comment: Pt needs mod instructional cueing for hand placement with sit to stand.  Ambulation/Gait Ambulation/Gait assistance: Min assist Ambulation Distance (Feet): 10 Feet (x3 bouts) Assistive device: Rolling walker (2 wheeled) Gait Pattern/deviations: ("hop to" gait pattern.) General Gait Details: 1 LOB when trying to turn with therapist providing Min A to prevent fall. Able to take small  steady hops. 2 seated rest breaks. Gait velocity: decreased Gait velocity interpretation: <1.8 ft/sec, indicative of risk for recurrent falls    ADL: ADL Overall ADL's : Needs assistance/impaired Grooming: Wash/dry hands, Wash/dry face, Sitting, Set up, Supervision/safety Upper Body Bathing: Supervision/ safety, Set up,  Sitting Lower Body Bathing: Maximal assistance Upper Body Dressing : Supervision/safety, Set up, Sitting Lower Body Dressing: Total assistance Toilet Transfer: Minimal assistance, RW, Ambulation, BSC Toilet Transfer Details (indicate cue type and reason): unable due to pain, per PT pt is mod A with SPT Toileting- Clothing Manipulation and Hygiene: Minimal assistance, Sit to/from stand Functional mobility during ADLs: Minimal assistance General ADL Comments: Pt currently min assist level for transfers and for dynamic standing balance with use of the RW. She was able to stand and alternate reaching for cup on her bedside table with min guard assist for balance. Demonstrates limited endurance for standing tasks and with functional mobiilty.    Cognition: Cognition Overall Cognitive Status: Difficult to assess (but appears [facility].) Orientation Level: Oriented X4 Cognition Arousal/Alertness: Awake/alert Behavior During Therapy: WFL for tasks assessed/performed Overall Cognitive Status: Difficult to assess (but appears [facility].) Difficult to assess due to: Non-English speaking   Blood pressure 105/56, pulse 89, temperature 98.3 F (36.8 C), temperature source Oral, resp. rate 16, height 4' 11"  (1.499 m), weight 52.5 kg (115 lb 11.9 oz), last menstrual period 05/12/2013, SpO2 94 %.     Physical Exam  Nursing note and vitals reviewed. Constitutional: She is oriented to person, place, and time. She appears well-developed and well-nourished.  Perseverated on rectal numbness and difficulty speaking since intubation.  HENT:  Head: Normocephalic and atraumatic.  Eyes: Conjunctivae are normal. Pupils are equal, round, and reactive to light.  Neck: Normal range of motion. Neck supple.  Cardiovascular: Normal rate and regular rhythm.  Respiratory: Effort normal and breath sounds normal. No respiratory distress. She has no wheezes.  GI: Soft. Bowel sounds are normal. She exhibits no distension.  There is tenderness.  Musculoskeletal: She exhibits no tenderness.  L-BKA with dry compressive dressing.  Neurological: She is alert and oriented to person, place, and time.  No dysarthria or slurring noted. Able to follow basic motor commands without difficulty. Moves BUE, RLE and Left residual limb without difficulty. UE 4/5. RLE 3/5 HF,KE and 4/5 adf/ LLE: HF 3/5. No sensory deficits.  Skin: Skin is warm and dry.  Psychiatric: She has a normal mood and affect. Her speech is normal and behavior is normal.     Lab Results Last 24 Hours    Results for orders placed or performed during the hospital encounter of 06/01/15 (from the past 24 hour(s))  Glucose, capillary Status: Abnormal   Collection Time: 06/10/15 11:39 AM  Result Value Ref Range   Glucose-Capillary 147 (H) 65 - 99 mg/dL  Glucose, capillary Status: Abnormal   Collection Time: 06/10/15 4:57 PM  Result Value Ref Range   Glucose-Capillary 213 (H) 65 - 99 mg/dL  Glucose, capillary Status: Abnormal   Collection Time: 06/10/15 8:44 PM  Result Value Ref Range   Glucose-Capillary 225 (H) 65 - 99 mg/dL  Glucose, capillary Status: Abnormal   Collection Time: 06/11/15 7:42 AM  Result Value Ref Range   Glucose-Capillary 207 (H) 65 - 99 mg/dL      Imaging Results (Last 48 hours)    No results found.    Assessment/Plan: Diagnosis: left BKA 1. Does the need for close, 24 hr/day medical supervision in concert with the patient's rehab needs make  it unreasonable for this patient to be served in a less intensive setting? Yes 2. Co-Morbidities requiring supervision/potential complications: sepsis, dm2 3. Due to bladder management, bowel management, safety, skin/wound care, disease management, medication administration, pain management and patient education, does the patient require 24 hr/day rehab nursing? Yes 4. Does the patient require coordinated care of a physician,  rehab nurse, PT (1-2 hrs/day, 5 days/week) and OT (1-2 hrs/day, 5 days/week) to address physical and functional deficits in the context of the above medical diagnosis(es)? Yes Addressing deficits in the following areas: balance, endurance, locomotion, strength, transferring, bowel/bladder control, bathing, dressing, feeding, grooming, toileting and psychosocial support 5. Can the patient actively participate in an intensive therapy program of at least 3 hrs of therapy per day at least 5 days per week? Yes 6. The potential for patient to make measurable gains while on inpatient rehab is excellent 7. Anticipated functional outcomes upon discharge from inpatient rehab are modified independent with PT, modified independent with OT, n/a with SLP. 8. Estimated rehab length of stay to reach the above functional goals is: 7-8 days 9. Does the patient have adequate social supports and living environment to accommodate these discharge functional goals? Yes 10. Anticipated D/C setting: Home 11. Anticipated post D/C treatments: HH therapy and Outpatient therapy 12. Overall Rehab/Functional Prognosis: excellent  RECOMMENDATIONS: This patient's condition is appropriate for continued rehabilitative care in the following setting: CIR Patient has agreed to participate in recommended program. Potentially Note that insurance prior authorization may be required for reimbursement for recommended care.  Comment: Rehab Admissions Coordinator to follow up. Needs a Optometrist.  Thanks,  Hailey Staggers, [name], Mellody Drown     06/11/2015

## 2015-06-12 NOTE — Care Management Note (Signed)
Case Management Note  Patient Details  Name: [name] MRN: [id number] Date of Birth: March 01, 1966  Subjective/Objective:          left BKA          Action/Plan: IP rehab  Expected Discharge Date:  06/12/2015               Expected Discharge Plan:  IP Rehab Facility  In-House Referral:  Clinical Social Work  Discharge planning Services  CM Consult  Status of Service:  Completed, signed off  Medicare Important Message Given:    Date Medicare IM Given:    Medicare IM give by:    Date Additional Medicare IM Given:    Additional Medicare Important Message give by:     If discussed at [place] of Stay Meetings, dates discussed:    Additional Comments: Chart reviewed. Scheduled dc to IP rehab.   Erenest Rasher, RN 06/12/2015, 3:15 PM

## 2015-06-12 NOTE — Progress Notes (Signed)
Peripherally Inserted Central Catheter/Midline Placement  The IV Nurse has discussed with the patient and/or persons authorized to consent for the patient, the purpose of this procedure and the potential benefits and risks involved with this procedure.  The benefits include less needle sticks, lab draws from the catheter and patient may be discharged home with the catheter.  Risks include, but not limited to, infection, bleeding, blood clot (thrombus formation), and puncture of an artery; nerve damage and irregular heat beat.  Alternatives to this procedure were also discussed.  PICC/Midline Placement Documentation    Information given to patient with interperperter line.    [name] 06/12/2015, 12:40 PM

## 2015-06-12 NOTE — Progress Notes (Signed)
Interpreter Lesle Chris for [employer] Suanne Marker and [facility]

## 2015-06-12 NOTE — Progress Notes (Signed)
Occupational Therapy Treatment Patient Details Name: [name] MRN: [id number] DOB: 06/21/1966 Today's Date: 06/12/2015    History of present illness 49 y.o. 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 IRRIGATION AND DEBRIDEMENT LEFT LEG, WITH CLOSURE OF BKA 7/9.   OT comments  Pt worked on bilateral UE therex sitting unsupported EOB.  Min to mod demonstrational cueing for sequencing and correct technique during performance of shoulder flexion and shoulder horizontal abduction exercises.  Pt with increased lean to the right side.  Will continue to follow for OT needs on inpatient rehab.   Follow Up Recommendations  CIR    Equipment Recommendations  Other (comment) (TBD next venue of care)       Precautions / Restrictions Precautions Precautions: Fall Restrictions Weight Bearing Restrictions: Yes LLE Weight Bearing: Non weight bearing       Mobility Bed Mobility   Bed Mobility: Supine to Sit     Supine to sit: Supervision;HOB elevated        Transfers                      Balance     Sitting balance-Leahy Scale: Good Sitting balance - Comments: Pt with increased lean to the right side in sitting when attempting UE exercises.  Able to self correct with min subtle cueing.                           ADL Overall ADL's : Needs assistance/impaired                                       General ADL Comments: Pt just had PICCline placed earlier.  Educated her on bilateral UE exercises while sitting EOB.  See exercise section of chart for details.  Pt needing min to mod demonstrational cueing for correct technique and for postural control sitting EOB.                 Cognition   Behavior During Therapy: WFL for tasks assessed/performed Overall Cognitive Status: Within Functional  Limits for tasks assessed                         Exercises General Exercises - Upper Extremity Shoulder Flexion: 20 reps;Strengthening;Both;Theraband;Seated Theraband Level (Shoulder Flexion): Level 1 (Yellow) Shoulder Horizontal ABduction: Strengthening;Both;20 reps;Seated;Theraband Theraband Level (Shoulder Horizontal Abduction): Level 1 (Yellow) Elbow Extension: Strengthening;20 reps;Theraband Theraband Level (Elbow Extension): Level 1 (Yellow)           Pertinent Vitals/ Pain       Pain Assessment: No/denies pain Faces Pain Scale: No hurt         Frequency Min 2X/week     Progress Toward Goals  OT Goals(current goals can now be found in the care plan section)  Progress towards OT goals: Progressing toward goals     Plan Discharge plan remains appropriate       End of Session Equipment Utilized During Treatment: Other (comment) (level one theraband)   Activity Tolerance     Patient Left in bed;with call bell/phone within reach;Other (comment) (Pt left with PT to continue with next treatment)           Time: 1351-1411 OT Time Calculation (min): 20  min  Charges: OT General Charges $OT Visit: 1 Procedure OT Treatments $Therapeutic Exercise: 8-22 mins  [place] Huberty OTR/L 06/12/2015, 2:19 PM

## 2015-06-13 ENCOUNTER — Inpatient Hospital Stay (HOSPITAL_COMMUNITY): Payer: Self-pay | Admitting: Occupational Therapy

## 2015-06-13 ENCOUNTER — Inpatient Hospital Stay (HOSPITAL_COMMUNITY): Payer: Self-pay

## 2015-06-13 DIAGNOSIS — R197 Diarrhea, unspecified: Secondary | ICD-10-CM

## 2015-06-13 LAB — CBC WITH DIFFERENTIAL/PLATELET
BASOS PCT: 1 % (ref 0–1)
Basophils Absolute: 0.1 10*3/uL (ref 0.0–0.1)
EOS ABS: 0.1 10*3/uL (ref 0.0–0.7)
EOS PCT: 2 % (ref 0–5)
HCT: 23.8 % — ABNORMAL LOW (ref 36.0–46.0)
HEMOGLOBIN: 7.8 g/dL — AB (ref 12.0–15.0)
LYMPHS ABS: 1.6 10*3/uL (ref 0.7–4.0)
Lymphocytes Relative: 25 % (ref 12–46)
MCH: 28.9 pg (ref 26.0–34.0)
MCHC: 32.8 g/dL (ref 30.0–36.0)
MCV: 88.1 fL (ref 78.0–100.0)
Monocytes Absolute: 0.5 10*3/uL (ref 0.1–1.0)
Monocytes Relative: 7 % (ref 3–12)
Neutro Abs: 4.3 10*3/uL (ref 1.7–7.7)
Neutrophils Relative %: 65 % (ref 43–77)
Platelets: 561 10*3/uL — ABNORMAL HIGH (ref 150–400)
RBC: 2.7 MIL/uL — ABNORMAL LOW (ref 3.87–5.11)
RDW: 13.5 % (ref 11.5–15.5)
WBC: 6.5 10*3/uL (ref 4.0–10.5)

## 2015-06-13 LAB — COMPREHENSIVE METABOLIC PANEL
ALT: 11 U/L — AB (ref 14–54)
AST: 21 U/L (ref 15–41)
Albumin: 1.9 g/dL — ABNORMAL LOW (ref 3.5–5.0)
Alkaline Phosphatase: 93 U/L (ref 38–126)
Anion gap: 7 (ref 5–15)
CHLORIDE: 103 mmol/L (ref 101–111)
CO2: 29 mmol/L (ref 22–32)
Calcium: 8.2 mg/dL — ABNORMAL LOW (ref 8.9–10.3)
Creatinine, Ser: 0.45 mg/dL (ref 0.44–1.00)
GFR calc non Af Amer: >60 mL/min (ref >60)
GLUCOSE: 243 mg/dL — AB (ref 65–99)
Potassium: 3.4 mmol/L — ABNORMAL LOW (ref 3.5–5.1)
Sodium: 139 mmol/L (ref 135–145)
Total Bilirubin: 0.3 mg/dL (ref 0.3–1.2)
Total Protein: 7 g/dL (ref 6.5–8.1)

## 2015-06-13 LAB — CLOSTRIDIUM DIFFICILE BY PCR: Toxigenic C. Difficile by PCR: NEGATIVE

## 2015-06-13 LAB — GLUCOSE, CAPILLARY
GLUCOSE-CAPILLARY: 186 mg/dL — AB (ref 65–99)
GLUCOSE-CAPILLARY: 214 mg/dL — AB (ref 65–99)

## 2015-06-13 MED ORDER — SODIUM CHLORIDE 0.9 % IJ SOLN
10.0000 mL | INTRAMUSCULAR | Status: DC | PRN
  Administered 2015-06-13 – 2015-06-15 (×2): 10 mL
  Filled 2015-06-13: qty 40

## 2015-06-13 MED ORDER — POTASSIUM CHLORIDE CRYS ER 20 MEQ PO TBCR
20.0000 meq | EXTENDED_RELEASE_TABLET | Freq: Once | ORAL | Status: AC
  Administered 2015-06-13: 20 meq via ORAL
  Filled 2015-06-13: qty 1

## 2015-06-13 MED ORDER — POTASSIUM CHLORIDE CRYS ER 10 MEQ PO TBCR
10.0000 meq | EXTENDED_RELEASE_TABLET | Freq: Two times a day (BID) | ORAL | Status: AC
  Administered 2015-06-13 – 2015-06-15 (×6): 10 meq via ORAL
  Filled 2015-06-13 (×7): qty 1

## 2015-06-13 MED ORDER — SODIUM CHLORIDE 0.9 % IJ SOLN
10.0000 mL | Freq: Two times a day (BID) | INTRAMUSCULAR | Status: DC
  Administered 2015-06-17: 10 mL
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
## 2015-06-13 NOTE — H&P (View-Only) (Signed)
Physical Medicine and Rehabilitation Admission H&P    Chief Complaint  Patient presents with  . 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     HPI:  [name] is a 49 y.o. female with h/o DM, vaginal cancer, chronic LLE infection who was admitted on 06/02/15 with gangrenous changes with necrotizing fascitis left foot and septic shock. She was intubated for acute respiratory failure and was started on IV antibiotics for treatment. She underwent left [place] amputation the same day for severe infection followed by BKA on 07/04. She required multiple I and D on 07/07 and 07/09 with closure of wound and Placement of antibiotic beads. Blood cultures were positive for Staph aureus and strep Viridans. Dr. Megan Salon consulted for input and recommended IV cefazolin with TEE to help determine duration of antibiotic regimen. 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  PICC placed today and patient to continue at least three additional weeks of IV antibiotics (therapy to be determined by wound healing) per ID.  Therapy ongoing and CIR recommended by MD and Rehab team for follow up therapy.  Review of Systems  HENT: Negative for hearing loss.   Eyes: Negative for blurred vision and double vision.  Respiratory: Positive for cough (intermittent).   Cardiovascular: Negative for chest pain and palpitations.  Gastrointestinal: Positive for abdominal pain (due to bloating/distension) and diarrhea (past meals--new since surgery). Negative for heartburn and nausea.       Anal discomfort due to burning/itching?  Genitourinary: Negative for dysuria, urgency and frequency.       Bowel incontinence.   Musculoskeletal: Negative for myalgias, back pain and joint pain.  Neurological: Positive for sensory change (numbness lower abdominal area and rectal area). Negative for dizziness, tingling, focal weakness and headaches.  Psychiatric/Behavioral: The patient  is nervous/anxious.     Past Medical History  Diagnosis Date  . Diabetes mellitus   . 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 Janie Morning), Cysto performed by Dr. Jonna Munro 12/2009 with evac of clots and bx and fulguration. // H/O stage 2 SCC of the vulva  . Anemia     2/2 blood loss  . Diabetes mellitus type 2, uncontrolled DX: 2001  . 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 Diabetic foot ulcers   . Cataract   . Diabetes mellitus without complication     Past Surgical History  Procedure Laterality Date  . Radical wide local excision of the vulva and right nguinal lymph node dissection  10/2003    Dr. Fermin Schwab  . Uterine dilatation and currettage    . Labial mass excision  07/2003    Dr. Ree Edman  . Left second toe mtp joint amputation  12/19/2010    Dr. Mayer Camel  . Irrigation and debridement of left foot with removal of left  01/29/2011    Dr. Mayer Camel  . Tubal ligation    . I&d extremity Left 06/02/2015    Procedure: IRRIGATION AND DEBRIDEMENT EXTREMITY;  Surgeon: Leandrew Koyanagi, MD;  Location: [facility];  Service: Orthopedics;  Laterality: Left;  . Amputation Left 06/02/2015    Procedure: Partial Left  AMPUTATION FOOT;  Surgeon: Leandrew Koyanagi, MD;  Location: [facility];  Service: Orthopedics;  Laterality: Left;  .  I&d extremity Left 06/05/2015    Procedure: IRRIGATION AND DEBRIDEMENT LEFT LEG, POSSIBLE CLOSURE BELOW KNEE AMPUTATION;  Surgeon: Leandrew Koyanagi, MD;  Location: [place];  Service: Orthopedics;  Laterality: Left;  . Amputation Left 06/03/2015    Procedure: Left Transtibial amputation,application of wound vac;  Surgeon: Leandrew Koyanagi, MD;  Location: [facility];  Service: Orthopedics;  Laterality: Left;  . I&d extremity Left 06/08/2015    Procedure: IRRIGATION AND DEBRIDEMENT  LEFT LEG, WITH  CLOSURE OF BKA;  Surgeon: Leandrew Koyanagi, MD;  Location: [place];  Service: Orthopedics;  Laterality: Left;  . Tee without cardioversion N/A 06/11/2015    Procedure: TRANSESOPHAGEAL ECHOCARDIOGRAM (TEE);  Surgeon: Lelon Perla, MD;  Location: [facility];  Service: Cardiovascular;  Laterality: N/A;    Family History  Problem Relation Age of Onset  . Diabetes Mother   . Diabetes Father   . Diabetes Brother     Social History: Lives with husband. Independent PTA. Husband works days. She reports that she has never smoked. She has never used smokeless tobacco. She reports that she does not drink alcohol or use illicit drugs.   Allergies: No Known Allergies    Medications Prior to Admission  Medication Sig Dispense Refill  . insulin aspart (NOVOLOG) 100 UNIT/ML injection Inject 3 Units into the skin 3 (three) times daily before meals. (Patient taking differently: Inject 18 Units into the skin 4 (four) times daily - after meals and at bedtime. ) 10 mL 2  . insulin glargine (LANTUS) 100 UNIT/ML injection Inject 0.24 mLs (24 Units total) into the skin at bedtime. (Patient taking differently: Inject 16 Units into the skin at bedtime. ) 10 mL 2  . atorvastatin (LIPITOR) 20 MG tablet Take 1 tablet (20 mg total) by mouth daily. (Patient not taking: Reported on 03/20/2015) 30 tablet 3  . Blood Glucose Monitoring Suppl (RELION CONFIRM GLUCOSE MONITOR) W/DEVICE KIT 1 kit by Does not apply route 3 (three) times daily. 1 kit 2  . doxycycline (VIBRA-TABS) 100 MG tablet Take 1 tablet (100 mg total) by mouth every 12 (twelve) hours. (Patient not taking: Reported on 06/01/2015) 20 tablet 0  . glucose monitoring kit (FREESTYLE) monitoring kit 1 each by Does not apply route 4 (four) times daily - after meals and at bedtime. 1 month Diabetic Testing Supplies for QAC-QHS accuchecks. (Patient not taking: Reported on 03/20/2015) 1 each 1  . oxyCODONE-acetaminophen (PERCOCET) 5-325 MG per tablet Take 1-2  tablets by mouth every 6 (six) hours as needed. (Patient not taking: Reported on 03/20/2015) 15 tablet 0  . Vitamin D, Ergocalciferol, (DRISDOL) 50000 UNITS CAPS capsule Take 1 capsule (50,000 Units total) by mouth every 7 (seven) days. (Patient not taking: Reported on 03/20/2015) 12 capsule 0    Home: Home Living Family/patient expects to be discharged to:: Private residence Living Arrangements: Spouse/significant other Available Help at Discharge: Family, Available 24 hours/day Type of Home: House Home Access: Stairs to enter Technical brewer of Steps: 5 Entrance Stairs-Rails: Left Home Layout: One level Bathroom Shower/Tub: Chiropodist: Handicapped height Home Equipment: Bedside commode   Functional History: Prior Function Level of Independence: Independent  Functional Status:  Mobility: Bed Mobility Overal bed mobility: Needs Assistance Bed Mobility: Supine to Sit Supine to sit: Min guard General bed mobility comments: Close guard for safety. Requires extra time. Able to perform without physical assist. Transfers Overall transfer level: Needs assistance Equipment used: Rolling walker (2 wheeled) Transfers: Sit to/from Stand Sit to Stand: Min  assist Stand pivot transfers: Min assist General transfer comment: Requires Mod cues for hand placement. Stood from [employer], from chair x 3. Assist for steadying. Transferred to chair post ambulation bout. Ambulation/Gait Ambulation/Gait assistance: Min assist Ambulation Distance (Feet): 20 Feet (+ 15' + 25') Assistive device: Rolling walker (2 wheeled) Gait Pattern/deviations: Step-to pattern (hop to gait pattern) General Gait Details: Pt fatigues easily requiring 3 seated rest breaks. Very slow gait. No knee buckling noted.  Gait velocity: decreased Gait velocity interpretation: <1.8 ft/sec, indicative of risk for recurrent falls    ADL: ADL Overall ADL's : Needs assistance/impaired Grooming: Wash/dry  hands, Wash/dry face, Sitting, Set up, Supervision/safety Upper Body Bathing: Supervision/ safety, Set up, Sitting Lower Body Bathing: Maximal assistance Upper Body Dressing : Supervision/safety, Set up, Sitting Lower Body Dressing: Total assistance Toilet Transfer: Minimal assistance, RW, Ambulation, BSC Toilet Transfer Details (indicate cue type and reason): unable due to pain, per PT pt is mod A with SPT Toileting- Clothing Manipulation and Hygiene: Minimal assistance, Sit to/from stand Functional mobility during ADLs: Minimal assistance General ADL Comments: Pt currently min assist level for transfers and for dynamic standing balance with use of the RW.  She was able to stand and alternate reaching for cup on her bedside table with min guard assist for balance.  Demonstrates limited endurance for standing tasks and with functional mobiilty.    Cognition: Cognition Overall Cognitive Status: Within Functional Limits for tasks assessed Orientation Level: Oriented X4 Cognition Arousal/Alertness: Awake/alert Behavior During Therapy: WFL for tasks assessed/performed Overall Cognitive Status: Within Functional Limits for tasks assessed Difficult to assess due to:  (pt. with limited understanding of English)   Blood pressure 117/63, pulse 86, temperature 98.2 F (36.8 C), temperature source Oral, resp. rate 16, height 4' 11"  (1.499 m), weight 52.5 kg (115 lb 11.9 oz), last menstrual period 05/12/2013, SpO2 100 %. Physical Exam Nursing note and vitals reviewed. Constitutional: She is oriented to person, place, and time. She appears well-developed and well-nourished.   HENT:  Head: Normocephalic and atraumatic.  Eyes: Conjunctivae are normal. Pupils are equal, round, and reactive to light.  Neck: Normal range of motion. Neck supple.  Cardiovascular: Normal rate and regular rhythm.   Respiratory: Effort normal and breath sounds normal. No respiratory distress. She has no wheezes.  GI:  Soft. Bowel sounds are normal. She exhibits no distension. There is tenderness.  Musculoskeletal: She exhibits no tenderness.  L-BKA with dry compressive dressing.   Neurological: She is alert and oriented to person, place, and time.  No dysarthria or slurring noted. Able to follow basic motor commands without difficulty. Moves BUE, RLE and Left residual limb without difficulty. UE 4/5. RLE 3/5 HF,KE and 4/5 adf/ LLE: HF 3/5. No sensory deficits.  Skin: Skin is warm and dry.  Psychiatric: She has a normal mood and affect. Her speech is normal and behavior is normal.     Results for orders placed or performed during the hospital encounter of 06/01/15 (from the past 48 hour(s))  Glucose, capillary     Status: Abnormal   Collection Time: 06/10/15  4:57 PM  Result Value Ref Range   Glucose-Capillary 213 (H) 65 - 99 mg/dL  Glucose, capillary     Status: Abnormal   Collection Time: 06/10/15  8:44 PM  Result Value Ref Range   Glucose-Capillary 225 (H) 65 - 99 mg/dL  Glucose, capillary     Status: Abnormal   Collection Time: 06/11/15  7:42 AM  Result Value Ref Range  Glucose-Capillary 207 (H) 65 - 99 mg/dL  Glucose, capillary     Status: Abnormal   Collection Time: 06/11/15 11:43 AM  Result Value Ref Range   Glucose-Capillary 130 (H) 65 - 99 mg/dL  Glucose, capillary     Status: Abnormal   Collection Time: 06/11/15  5:10 PM  Result Value Ref Range   Glucose-Capillary 134 (H) 65 - 99 mg/dL  Glucose, capillary     Status: Abnormal   Collection Time: 06/11/15  9:03 PM  Result Value Ref Range   Glucose-Capillary 176 (H) 65 - 99 mg/dL  CBC     Status: Abnormal   Collection Time: 06/12/15  5:45 AM  Result Value Ref Range   WBC 7.7 4.0 - 10.5 K/uL   RBC 2.73 (L) 3.87 - 5.11 MIL/uL   Hemoglobin 7.9 (L) 12.0 - 15.0 g/dL   HCT 24.3 (L) 36.0 - 46.0 %   MCV 89.0 78.0 - 100.0 fL   MCH 28.9 26.0 - 34.0 pg   MCHC 32.5 30.0 - 36.0 g/dL   RDW 13.7 11.5 - 15.5 %   Platelets 540 (H) 150 - 400  K/uL  Basic metabolic panel     Status: Abnormal   Collection Time: 06/12/15  5:45 AM  Result Value Ref Range   Sodium 137 135 - 145 mmol/L   Potassium 3.5 3.5 - 5.1 mmol/L   Chloride 103 101 - 111 mmol/L   CO2 27 22 - 32 mmol/L   Glucose, Bld 242 (H) 65 - 99 mg/dL   BUN 6 6 - 20 mg/dL   Creatinine, Ser 0.55 0.44 - 1.00 mg/dL   Calcium 8.0 (L) 8.9 - 10.3 mg/dL   GFR calc non Af Amer >60 >60 mL/min   GFR calc Af Amer >60 >60 mL/min    Comment: (NOTE) The eGFR has been calculated using the CKD EPI equation. This calculation has not been validated in all clinical situations. eGFR's persistently <60 mL/min signify possible Chronic Kidney Disease.    Anion gap 7 5 - 15  Glucose, capillary     Status: Abnormal   Collection Time: 06/12/15  7:46 AM  Result Value Ref Range   Glucose-Capillary 234 (H) 65 - 99 mg/dL  Glucose, capillary     Status: Abnormal   Collection Time: 06/12/15 12:03 PM  Result Value Ref Range   Glucose-Capillary 182 (H) 65 - 99 mg/dL   No results found.     Medical Problem List and Plan: 1. 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  DVT Prophylaxis/Anticoagulation: Pharmaceutical: Lovenox 3. Pain Management: Oxycodone prn.  4. Mood: Team to provide ego support. LCSW to follow for evaluation and support.  5. Neuropsych: This patient is capable of making decisions on her own behalf. 6. Skin/Wound Care: Routine pressure relief measures. Monitor wound for healing.  7. Fluids/Electrolytes/Nutrition: Monitor I/O. Check lytes in am.  8. Acute on Chronic iron deficiency anemia: Baseline Hgb- 9.9. Will add iron supplement.  Asymptomatic at this time. Recheck in am and transfuse prn symptoms or drop < 7.0 9. DM type 2: Will monitor BS ac/hs. Continue lantus and titrated to home dose as indicated. Will use SSI for elevated BS.  10. MSSA bacteremia with SIRS reaction: Continue IV ancef for 3 additional weeks.  Day # 8/28 11. Protein malnutrition: Will add protein  supplement.  12. Diarrhea with incontinence: No abdominal distension noted. Will check stool for C diff. Add probiotic and bulking agent.  Check KUB to rule out obstipation.  Post Admission Physician Evaluation: 1. Functional deficits secondary  to L-BKA 2. Patient is admitted to receive collaborative, interdisciplinary care between the physiatrist, rehab nursing staff, and therapy team. 3. Patient's level of medical complexity and substantial therapy needs in context of that medical necessity cannot be provided at a lesser intensity of care such as a SNF. 4. Patient has experienced substantial functional loss from his/her baseline which was documented above under the "Functional History" and "Functional Status" headings.  Judging by the patient's diagnosis, physical exam, and functional history, the patient has potential for functional progress which will result in measurable gains while on inpatient rehab.  These gains will be of substantial and practical use upon discharge  in facilitating mobility and self-care at the household level. 5. Physiatrist will provide 24 hour management of medical needs as well as oversight of the therapy plan/treatment and provide guidance as appropriate regarding the interaction of the two. 6. 24 hour rehab nursing will assist with bladder management, bowel management, safety, skin/wound care, disease management, medication administration, pain management and patient education  and help integrate therapy concepts, techniques,education, etc. 7. PT will assess and treat for/with: Lower extremity strength, range of motion, stamina, balance, functional mobility, safety, adaptive techniques and equipment, pain mgt, pre-prosthetic ed.   Goals are: mod I. 8. OT will assess and treat for/with: ADL's, functional mobility, safety, upper extremity strength, adaptive techniques and equipment, pain mgt, wound care, ego support.   Goals are: mod I. Therapy may not yet proceed with  showering this patient. 9. SLP will assess and treat for/with: n/a.  Goals are: n/a. 10. Case Management and Social Worker will assess and treat for psychological issues and discharge planning. 11. Team conference will be held weekly to assess progress toward goals and to determine barriers to discharge. 12. Patient will receive at least 3 hours of therapy per day at least 5 days per week. 13. ELOS: 7 days       14. Prognosis:  excellent. Will need a translator for therapy.     Meredith Staggers, MD, [facility] 06/12/2015   06/12/2015

## 2015-06-13 NOTE — Progress Notes (Signed)
[place] PHYSICAL MEDICINE & REHABILITATION     PROGRESS NOTE    Subjective/Complaints: No major issues overnight. Two soft/formed bm's this am. Pain controlled. Asked how long she'll be on rehab. No breathing issues.   ROS: Pt denies fever, rash/itching, headache, blurred or double vision, nausea, vomiting, abdominal pain, diarrhea, chest pain, shortness of breath, palpitations, dysuria, dizziness, neck or back pain, bleeding, anxiety, or depression   Objective: Vital Signs: Blood pressure 117/66, pulse 89, temperature 98.1 F (36.7 C), temperature source Oral, resp. rate 16, weight 52.073 kg (114 lb 12.8 oz), last menstrual period 05/12/2013, SpO2 100 %. No results found.  Recent Labs  06/12/15 0545 06/13/15 0608  WBC 7.7 6.5  HGB 7.9* 7.8*  HCT 24.3* 23.8*  PLT 540* 561*    Recent Labs  06/12/15 0545 06/13/15 0608  NA 137 139  K 3.5 3.4*  CL 103 103  GLUCOSE 242* 243*  BUN 6 <5*  CREATININE 0.55 0.45  CALCIUM 8.0* 8.2*   CBG (last 3)   Recent Labs  06/12/15 1721 06/12/15 2123 06/13/15 0703  GLUCAP 189* 188* 186*    Wt Readings from Last 3 Encounters:  06/12/15 52.073 kg (114 lb 12.8 oz)  06/06/15 52.5 kg (115 lb 11.9 oz)  03/21/15 60.328 kg (133 lb)    Physical Exam:  Nursing note and vitals reviewed. Constitutional: She is oriented to person, place, and time. She appears well-developed and well-nourished.   HENT: Head: Normocephalic and atraumatic.  Eyes: Conjunctivae are normal. Pupils are equal, round, and reactive to light.  Neck: Normal range of motion. Neck supple.  Cardiovascular: Normal rate and regular rhythm. no murmur Respiratory: Effort normal and breath sounds normal. No respiratory distress. She has no wheezes.  GI: Soft. Bowel sounds are normal. She exhibits no distension.  Musculoskeletal: She exhibits no tenderness.  L-BKA dry, well approximated with sutures--ACE wrap loose Neurological: She is alert and oriented to  person, place, and time.  No dysarthria or slurring noted. Able to follow basic motor commands without difficulty. Moves BUE, RLE and Left residual limb without difficulty. UE 4/5. RLE 3/5 HF,KE and 4/5 adf/ LLE: HF 3/5. No sensory deficits.  Skin: Skin is warm and dry.  Psychiatric: She has a normal mood and affect. Her speech is normal and behavior is normal.   Assessment/Plan: 1. Functional deficits secondary to left BKA which require 3+ hours per day of interdisciplinary therapy in a comprehensive inpatient rehab setting. Physiatrist is providing close team supervision and 24 hour management of active medical problems listed below. Physiatrist and rehab team continue to assess barriers to discharge/monitor patient progress toward functional and medical goals. FIM:                                  Medical Problem List and Plan: 1. 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 DVT Prophylaxis/Anticoagulation: Pharmaceutical: Lovenox---continue given VTE risk 3. Pain Management: Oxycodone prn seems to be holding her 4. Mood: Team to provide ego support. LCSW to follow for evaluation and support.  5. Neuropsych: This patient is capable of making decisions on her own behalf. 6. Skin/Wound Care: Routine pressure relief measures. Monitor wound for healing. --ACE wrap reapplied to leg 7. Fluids/Electrolytes/Nutrition: Monitor I/O. Lytes generally wnl---gently replace K+  8. Acute on Chronic iron deficiency anemia: Baseline Hgb- 9.9. Asymptomatic  -follow daily and transfuse if hgb falls below 7.0 9. DM type 2: Will monitor BS  ac/hs. Continue lantus and titrated to home dose as indicated. Will use SSI for elevated BS.  10. MSSA bacteremia with SIRS reaction: Continue IV ancef for 3 additional weeks. Day # 9/28  -afebrile 11. Protein malnutrition:   protein supplement.  12. Diarrhea with incontinence: No abdominal distension noted, stools now formed. C diff sample pending.  Added probiotic and bulking agent.    LOS (Days) 1 A FACE TO FACE EVALUATION WAS PERFORMED  SWARTZ,ZACHARY T 06/13/2015 8:21 AM

## 2015-06-13 NOTE — Progress Notes (Addendum)
Physical Therapy Session Note  Patient Details  Name: [name] MRN: [id number] Date of Birth: 20-Sep-1966  Today's Date: 06/13/2015 PT Individual Time: 1500-1525 PT Individual Time Calculation (min): 25 min   Short Term Goals: Week 1:  PT Short Term Goal 1 (Week 1): = LTGs  Skilled Therapeutic Interventions/Progress Updates:   Pt had residual limb unwrapped with minimal drainage noted. Notified RN who redressed residual limb. Educated pt on purpose of wrapping and introduced written HEP for residual limb ROM and strengthening. Pt performed SLR, quad set, and knee flex/extension seated in w/c and therapist demonstrated sidelying/supine exercises from the pictures. Pt verbalized understanding. Pt's husband also present and discussed possibility of pt d/c home early next week. He appeared a little concerned stating she wouldn't have assistance at home and that she was weak. Encouraged that she would be able to be mod I w/c level and we will continue to monitor her progress as today was her first full day of therapies. Left up in w/c with RN in room and husband at bedside.    Therapy Documentation Precautions:  Precautions Precautions: Fall Precaution Comments: L BKA Restrictions Weight Bearing Restrictions: Yes LLE Weight Bearing: Non weight bearing  Pain:  Denies pain.  See FIM for current functional status  Therapy/Group: Individual Therapy  [name] Ivory Broad, PT, DPT  06/13/2015, 3:40 PM
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
## 2015-06-13 NOTE — Interval H&P Note (Signed)
[name] was admitted 06/12/15 to Inpatient Rehabilitation with the diagnosis of left BKA.  The patient's history has been reviewed, patient examined, and there is no change in status.  Patient continues to be appropriate for intensive inpatient rehabilitation.  I have reviewed the patient's chart and labs.  Questions were answered to the patient's satisfaction. The PAPE has been reviewed and assessment remains appropriate.  [name] T 06/13/2015, 6:46 AM

## 2015-06-13 NOTE — Progress Notes (Signed)
Patient information reviewed and entered into eRehab system by Kaylon Hitz, RN, CRRN, PPS Coordinator.  Information including medical coding and functional independence measure will be reviewed and updated through discharge.    

## 2015-06-14 ENCOUNTER — Inpatient Hospital Stay (HOSPITAL_COMMUNITY): Payer: Self-pay

## 2015-06-14 ENCOUNTER — Inpatient Hospital Stay (HOSPITAL_COMMUNITY): Payer: Self-pay | Admitting: Occupational Therapy

## 2015-06-14 DIAGNOSIS — R7881 Bacteremia: Secondary | ICD-10-CM | POA: Insufficient documentation

## 2015-06-14 DIAGNOSIS — L089 Local infection of the skin and subcutaneous tissue, unspecified: Secondary | ICD-10-CM

## 2015-06-14 LAB — GLUCOSE, CAPILLARY
GLUCOSE-CAPILLARY: 212 mg/dL — AB (ref 65–99)
GLUCOSE-CAPILLARY: 189 mg/dL — AB (ref 65–99)
Glucose-Capillary: 235 mg/dL — ABNORMAL HIGH (ref 65–99)
Glucose-Capillary: 324 mg/dL — ABNORMAL HIGH (ref 65–99)
Glucose-Capillary: 242 mg/dL — ABNORMAL HIGH (ref 65–99)
Glucose-Capillary: 284 mg/dL — ABNORMAL HIGH (ref 65–99)

## 2015-06-14 NOTE — Progress Notes (Signed)
Patient ID: [name], female   DOB: 1966-02-24, 49 y.o.   MRN: [id number]         [facility] for Infectious Disease    Date of Admission:  06/12/2015    Total days of antibiotics 14         Day 10 cefazolin         Principal Problem:   Status post below knee amputation of left lower extremity Active Problems:   DM type 2 (diabetes mellitus, type 2)   Diabetes mellitus type 2, controlled   .  ceFAZolin (ANCEF) IV  2 g Intravenous 3 times per day  . enoxaparin (LOVENOX) injection  40 mg Subcutaneous QHS  . feeding supplement (PRO-STAT SUGAR FREE 64)  30 mL Oral TID  . insulin aspart  0-15 Units Subcutaneous TID WC  . insulin aspart  0-5 Units Subcutaneous QHS  . insulin glargine  5 Units Subcutaneous QHS  . iron polysaccharides  150 mg Oral q12n4p  . magic mouthwash w/lidocaine  5 mL Oral QID  . multivitamin with minerals  1 tablet Oral Daily  . polycarbophil  625 mg Oral BID  . potassium chloride  10 mEq Oral BID  . saccharomyces boulardii  250 mg Oral BID  . simethicone  80 mg Oral QID  . sodium chloride  10-40 mL Intracatheter Q12H    Past Medical History  Diagnosis Date  . Diabetes mellitus   . 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 Janie Morning), Cysto performed by Dr. Jonna Munro 12/2009 with evac of clots and bx and fulguration. // H/O stage 2 SCC of the vulva  . Anemia     2/2 blood loss  . Diabetes mellitus type 2, uncontrolled DX: 2001  . 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 Diabetic foot ulcers   . Cataract   . Diabetes mellitus without complication     History  Substance Use Topics  . Smoking status: Never Smoker   . Smokeless tobacco: Never Used  . Alcohol Use: No    Family History  Problem Relation Age of Onset   . Diabetes Mother   . Diabetes Father   . Diabetes Brother    No Known Allergies  OBJECTIVE: Blood pressure 105/56, pulse 87, temperature 98.6 F (37 C), temperature source Oral, resp. rate 16, weight 114 lb 12.8 oz (52.073 kg), last menstrual period 05/12/2013, SpO2 100 %. She is comfortable and in no distress sitting up in her chair Lungs: Clear Cor: Regular S1 and S2 with no murmur Left BKA stump clean and dry. There is one small area of clear drainage laterally but no evidence of active infection  Lab Results Lab Results  Component Value Date   WBC 6.5 06/13/2015   HGB 7.8* 06/13/2015   HCT 23.8* 06/13/2015   MCV 88.1 06/13/2015   PLT 561* 06/13/2015    Lab Results  Component Value Date   CREATININE 0.45 06/13/2015   BUN <5* 06/13/2015   NA 139 06/13/2015   K 3.4* 06/13/2015   CL 103 06/13/2015   CO2 29 06/13/2015    Lab Results  Component Value Date   ALT 11* 06/13/2015   AST 21 06/13/2015   ALKPHOS 93 06/13/2015   BILITOT 0.3 06/13/2015  Microbiology: Recent Results (from the past 240 hour(s))  Culture, blood (routine x 2)     Status: None   Collection Time: 06/05/15  3:05 PM  Result Value Ref Range Status   Specimen Description BLOOD RIGHT HAND  Final   Special Requests BOTTLES DRAWN AEROBIC ONLY 10CC  Final   Culture NO GROWTH 5 DAYS  Final   Report Status 06/10/2015 FINAL  Final  Culture, blood (routine x 2)     Status: None   Collection Time: 06/05/15  6:09 PM  Result Value Ref Range Status   Specimen Description BLOOD RIGHT HAND  Final   Special Requests BOTTLES DRAWN AEROBIC AND ANAEROBIC 10CC  Final   Culture NO GROWTH 5 DAYS  Final   Report Status 06/10/2015 FINAL  Final  Clostridium Difficile by PCR (not at [facility])     Status: None   Collection Time: 06/13/15  5:47 AM  Result Value Ref Range Status   C difficile by pcr NEGATIVE NEGATIVE Final   TEE: No evidence of endocarditis noted  Assessment: She continues to improve on therapy for  left leg infection and MSSA bacteremia. She underwent left BKA and is now completed 2 weeks of antibiotics therapy. Her BKA stump looks good and she has no evidence of endocarditis by exam or TEE. According to her nurse she will probably be discharged sometime next week. I would recommend discontinuing cefazolin on discharge. I do not feel that she will need a longer course of oral antibiotics therapy.  Plan: 1. Continue cefazolin  2. I will sign off now but please call if we can be of further assistance while she is here  Michel Bickers, [place] for [place] [phone] pager   [phone] cell 06/14/2015, 5:32 PM

## 2015-06-14 NOTE — Care Management Note (Signed)
Inpatient [facility] Individual Statement of Services  Patient Name:  Aileena Iglesia  Date:  06/14/2015  Welcome to the [facility].  Our goal is to provide you with an individualized program based on your diagnosis and situation, designed to meet your specific needs.  With this comprehensive rehabilitation program, you will be expected to participate in at least 3 hours of rehabilitation therapies Monday-Friday, with modified therapy programming on the weekends.  Your rehabilitation program will include the following services:  Physical Therapy (PT), Occupational Therapy (OT), 24 hour per day rehabilitation nursing, Therapeutic Recreaction (TR), Case Management (Social Worker), Rehabilitation Medicine, Nutrition Services and Pharmacy Services  Weekly team conferences will be held on Tuesdays to discuss your progress.  Your Social Worker will talk with you frequently to get your input and to update you on team discussions.  Team conferences with you and your family in attendance may also be held.  Expected length of stay: 7 days  Overall anticipated outcome: modified independent  Depending on your progress and recovery, your program may change. Your Social Worker will coordinate services and will keep you informed of any changes. Your Social Worker's name and contact numbers are listed  below.  The following services may also be recommended but are not provided by the [facility] will be made to provide these services after discharge if needed.  Arrangements include referral to agencies that provide these services.  Your insurance has been verified to be:  None Your primary doctor is:  Dr. Annitta Needs @ [facility]  Pertinent information will be shared with your doctor and your insurance company.  Social  Worker:  [place], [place] or ([phone]   Information discussed with and copy given to patient by: Lennart Pall, 06/14/2015, 12:50 PM

## 2015-06-14 NOTE — Progress Notes (Signed)
[facility]IC[facility] RE[facility]ILITATION     PROGRESS NOTE    Subjective/Complaints: Had a good night. Moved well in therapy yesterday.    ROS: Pt denies fever, rash/itching, headache, blurred or double vision, nausea, vomiting, abdominal pain, diarrhea, chest pain, shortness of breath, palpitations, dysuria, dizziness, neck or back pain, bleeding, anxiety, or depression   Objective: Vital Signs: Blood pressure 104/58, pulse 87, temperature 98.8 F (37.1 C), temperature source Oral, resp. rate 17, weight 52.073 kg (114 lb 12.8 oz), last menstrual period 05/12/2013, SpO2 97 %. No results found.  Recent Labs  06/12/15 0545 06/13/15 0608  WBC 7.7 6.5  HGB 7.9* 7.8*  HCT 24.3* 23.8*  PLT 540* 561*    Recent Labs  06/12/15 0545 06/13/15 0608  NA 137 139  K 3.5 3.4*  CL 103 103  GLUCOSE 242* 243*  BUN 6 <5*  CREATININE 0.55 0.45  CALCIUM 8.0* 8.2*   CBG (last 3)   Recent Labs  06/13/15 1648 06/13/15 2111 06/14/15 0623  GLUCAP 212* 235* 324*    Wt Readings from Last 3 Encounters:  06/12/15 52.073 kg (114 lb 12.8 oz)  06/06/15 52.5 kg (115 lb 11.9 oz)  03/21/15 60.328 kg (133 lb)    Physical Exam:  Nursing note and vitals reviewed. Constitutional: She is oriented to person, place, and time. She appears well-developed and well-nourished.   HENT: Head: Normocephalic and atraumatic.  Eyes: Conjunctivae are normal. Pupils are equal, round, and reactive to light.  Neck: Normal range of motion. Neck supple.  Cardiovascular: Normal rate and regular rhythm. no murmur Respiratory: Effort normal and breath sounds normal. No respiratory distress. She has no wheezes.  GI: Soft. Bowel sounds are normal. She exhibits no distension.  Musculoskeletal: She exhibits no tenderness.  L-BKA dry, well approximated with sutures--ACE wrap loose Neurological: She is alert and oriented to person, place, and time.  No dysarthria or slurring noted. Able to follow basic  motor commands without difficulty. Moves BUE, RLE and Left residual limb without difficulty. UE 4/5. RLE 3/5 HF,KE and 4/5 adf/ LLE: HF 3/5. No sensory deficits.  Skin: Skin is warm and dry.  Psychiatric: She has a normal mood and affect. Her speech is normal and behavior is normal.   Assessment/Plan: 1. Functional deficits secondary to left BKA which require 3+ hours per day of interdisciplinary therapy in a comprehensive inpatient rehab setting. Physiatrist is providing close team supervision and 24 hour management of active medical problems listed below. Physiatrist and rehab team continue to assess barriers to discharge/monitor patient progress toward functional and medical goals. FIM: FIM - Bathing Bathing Steps Patient Completed: Chest, Abdomen, Right upper leg, Front perineal area, Right Arm, Left Arm, Buttocks, Right lower leg (including foot), Left upper leg Bathing: 4: Steadying assist  FIM - Upper Body Dressing/Undressing Upper body dressing/undressing steps patient completed: Thread/unthread right sleeve of pullover shirt/dresss, Thread/unthread left sleeve of pullover shirt/dress, Put head through opening of pull over shirt/dress, Pull shirt over trunk Upper body dressing/undressing: 5: Set-up assist to: Obtain clothing/put away FIM - Lower Body Dressing/Undressing Lower body dressing/undressing steps patient completed: Thread/unthread right pants leg, Thread/unthread left pants leg, Pull pants up/down, Don/Doff right sock Lower body dressing/undressing: 4: Steadying Assist  FIM - Toileting Toileting steps completed by patient: Adjust clothing prior to toileting, Performs perineal hygiene, Adjust clothing after toileting Toileting Assistive Devices: Grab bar or rail for support Toileting: 4: Steadying assist  FIM - Radio producer Devices: Product manager  Transfers: 5-To toilet/BSC: Supervision (verbal cues/safety issues), 4-From toilet/BSC: Min A  (steadying Pt. > 75%)  FIM - Bed/Chair Transfer Bed/Chair Transfer Assistive Devices: Arm rests Bed/Chair Transfer: 5: Supine > Sit: Supervision (verbal cues/safety issues), 5: Sit > Supine: Supervision (verbal cues/safety issues), 4: Bed > Chair or W/C: Min A (steadying Pt. > 75%), 4: Chair or W/C > Bed: Min A (steadying Pt. > 75%)  FIM - Locomotion: Wheelchair Locomotion: Wheelchair: 2: Travels 50 - 149 ft with supervision, cueing or coaxing FIM - Locomotion: Ambulation Locomotion: Ambulation Assistive Devices: Walker - Rolling Locomotion: Ambulation: 1: Travels less than 50 ft with minimal assistance (Pt.>75%)  Comprehension Comprehension Mode: Auditory Comprehension: 5-Understands basic 90% of the time/requires cueing < 10% of the time  Expression Expression Mode: Verbal Expression: 4-Expresses basic 75 - 89% of the time/requires cueing 10 - 24% of the time. Needs helper to occlude trach/needs to repeat words.  Social Interaction Social Interaction: 4-Interacts appropriately 75 - 89% of the time - Needs redirection for appropriate language or to initiate interaction.  Problem Solving Problem Solving: 5-Solves basic 90% of the time/requires cueing < 10% of the time  Memory Memory: 6-More than reasonable amt of time  Medical Problem List and Plan: 1. 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 DVT Prophylaxis/Anticoagulation: Pharmaceutical: Lovenox---continue given VTE risk 3. Pain Management: Oxycodone prn seems to be holding her 4. Mood: Team to provide ego support. LCSW to follow for evaluation and support.  5. Neuropsych: This patient is capable of making decisions on her own behalf. 6. Skin/Wound Care: Routine pressure relief measures. Monitor wound for healing. --ACE wrap reapplied to leg 7. Fluids/Electrolytes/Nutrition: Monitor I/O. modest replace K+  8. Acute on Chronic iron deficiency anemia: Baseline Hgb- 9.9. Asymptomatic  -hgb holding at 7.8---no signs of blood  loss on exam 9. DM type 2: Will monitor BS ac/hs. Continue lantus and titrated to home dose as indicated. Will use SSI for elevated BS.  10. MSSA bacteremia with SIRS reaction: Continue IV ancef for 3 additional weeks (through 8/2)  -afebrile 11. Protein malnutrition:   protein supplement.  12. Diarrhea with incontinence: No abdominal distension noted, stools now formed. C diff sample pending. Added probiotic and bulking agent.    LOS (Days) 2 A FACE TO FACE EVALUATION WAS PERFORMED  Aurielle Slingerland T 06/14/2015 8:37 AM
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
## 2015-06-14 NOTE — IPOC Note (Signed)
Overall Plan of Care [facility]) Patient Details Name: [name] MRN: [id number] DOB: 02-May-1966  Admitting Diagnosis: l bka  Hospital Problems: Principal Problem:   Status post below knee amputation of left lower extremity Active Problems:   DM type 2 (diabetes mellitus, type 2)   Diabetes mellitus type 2, controlled     Functional Problem List: Nursing Behavior, Bladder, Bowel, Edema, Medication Management, Nutrition, Pain, Safety, Skin Integrity  PT Balance, Edema, Endurance, Pain, Sensory, Skin Integrity  OT Balance, Endurance, Motor, Pain, Safety  SLP    TR         Basic ADL's: OT       Advanced  ADL's: OT Simple Meal Preparation, Laundry, Light Housekeeping     Transfers: PT Bed Mobility, Bed to Chair, Car, Manufacturing systems engineer, Metallurgist: PT Ambulation, Emergency planning/management officer, Stairs     Additional Impairments: OT None  SLP        TR      Anticipated Outcomes Item Anticipated Outcome  Self Feeding independent  Swallowing      Basic self-care  mod I  Toileting  mod I   Bathroom Transfers mod I  Bowel/Bladder  min assist  Transfers  mod I w/c level  Locomotion  S household gait; mod I w/c  Communication     Cognition     Pain  <3 on a 0-10 pain scale  Safety/Judgment  min assist   Therapy Plan: PT Intensity: Minimum of 1-2 x/day ,45 to 90 minutes PT Frequency: 5 out of 7 days PT Duration Estimated Length of Stay: 7 days OT Intensity: Minimum of 1-2 x/day, 45 to 90 minutes OT Frequency: 5 out of 7 days OT Duration/Estimated Length of Stay: 5-7 days         Team Interventions: Nursing Interventions Patient/Family Education, Medication Management, Bowel Management, Bladder Management, Disease Management/Prevention, Pain Management, Skin Care/Wound Management, Discharge Planning, Psychosocial Support  PT interventions Ambulation/gait training, Training and development officer, Community reintegration, Discharge planning,  Disease management/prevention, DME/adaptive equipment instruction, Functional mobility training, Neuromuscular re-education, Pain management, Patient/family education, Psychosocial support, Skin care/wound management, Stair training, Therapeutic Activities, Therapeutic Exercise, UE/LE Strength taining/ROM, UE/LE Coordination activities, Wheelchair propulsion/positioning  OT Interventions Training and development officer, Therapeutic Activities, UE/LE Strength taining/ROM, Therapeutic Exercise, UE/LE Coordination activities, Wheelchair propulsion/positioning, Self Care/advanced ADL retraining, Patient/family education, Psychosocial support, Community reintegration, Cognitive remediation/compensation, Discharge planning, DME/adaptive equipment instruction  SLP Interventions    TR Interventions    SW/CM Interventions Discharge Planning, Barrister's clerk, Patient/Family Education    Team Discharge Planning: Destination: PT-Home ,OT- Home , SLP-  Projected Follow-up: PT-Home health PT, OT-  Home health OT, SLP-  Projected Equipment Needs: PT-Rolling walker with 5" wheels, Wheelchair cushion (measurements), Wheelchair (measurements), OT- Tub/shower bench, SLP-  Equipment Details: PT- , OT-  Patient/family involved in discharge planning: PT- Patient,  OT- , SLP-   MD ELOS: 7 days Medical Rehab Prognosis:  Excellent Assessment: The patient has been admitted for CIR therapies with the diagnosis of left BKA. The team will be addressing functional mobility, strength, stamina, balance, safety, adaptive techniques and equipment, self-care, bowel and bladder mgt, patient and caregiver education, pain mgt, pre-prosthetic education, ego support, wound mgt, community reintegration. Goals have been set at mod I for basic mobility and self-care at a w/c level. Min assist for gait.    Meredith Staggers, MD, FAAPMR      See Team Conference Notes for weekly updates to the plan of care
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
## 2015-06-14 NOTE — Progress Notes (Signed)
Occupational Therapy Session Note  Patient Details  Name: [name] MRN: [id number] Date of Birth: [birth year]  Today's Date: 06/14/2015 OT Individual Time: 1000-1100 and 1330-1430 OT Individual Time Calculation (min): 60 min and 60 min   Short Term Goals: Week 1:  OT Short Term Goal 1 (Week 1): STG=LTG due to estimated LOS  Skilled Therapeutic Interventions/Progress Updates:    Session One: Pt seen for OT therapy focusing on ADL/IADL re-training and functional standing balance and endurance. Pt sitting in w/Townsend upon arrival, agreeable to tx. Pt adamently denied showering/ bathing task. Pt voiced she is uncomfortable bathing with assist and that she would do it with her husband at d/Townsend. Pt educated need to practice transfers prior to d/Townsend in order to ensure safety upon d/Townsend. Pt stated that she "had it calculated how to shower safely", "has friends that have diabetes and shower, so she knows how to shower", and then that "God would ensure she was Safe".Therapist attempted to encourage pt to complete tasks, ensuring privacy, and explaining importance of safety and role of OT. Pt agreeable to practicing kitchen mobility. Pt self propelled w/Townsend throughout unit with 1 rest break due to decrease functional endurance. In ADL apartment, pt ambulated with RW and close supervision, ~20 ft total. Pt simulated making pot of coffee, requiring max VCs for safety of RW with min carry over of learning. Education provided regarding fall risk, RW management, need for safety, kitchen recommendations including putting commonly used items on lower shelf, and sliding objects along counter top- pt demonstrated understanding. Pt returned to room at end of session, left sitting in w/Townsend with seat alarm on and all needs in reach.   Session Two: Pt seen for OT session focusing on w/Townsend management, functional activity tolerance, UE strengthening, and functional standing balance. Pt in w/Townsend upon arrival, no interpreter present,  and pt agreeable to tx session. Pt self propelled w/Townsend throughout unit and to off unit patio focusing on w/Townsend management. Pt required min A for going over uneven surfaces, specifically, when going down a ramp as pt demonstrated decreased control of w/Townsend. Verbal and demonstration education provided for w/Townsend management to turn, pt requiring min A to make sharp turns and VCs to remain propelling in straight direction down hallway. Pt required rest breaks throughout community distance w/Townsend propulsion due to decreased functional endurance. She cont to required verbal and tactile cues for management of w/Townsend parts including removing leg rests and setting w/Townsend breaks.  Upon return to unit, pt stood from w/Townsend to complete bean bag toss. She stood with hand help assist and min-mod steadying assist on R side while pt obtained and tossed bean bag with L UE. Pt tolerated ~1 minute of static standing before requesting seated rest break- completed x4 trials. Pt then transferred onto therapy mat via squat pivot with demonstration provided for technique. In supine, pt completed LE exercises with L LE, demonstrating good carryover of education provided in previous therapy session. Pt completed L leg lifts and AB/ADduction x10. Pt returned to room at end of session, w/Townsend seat alarm on, and all needs in reach. RN made aware.    Therapy Documentation Precautions:  Precautions Precautions: Fall Precaution Comments: L BKA Restrictions Weight Bearing Restrictions: Yes LLE Weight Bearing: Non weight bearing Pain: Pain Assessment Faces Pain Scale: Hurts a little bit Pain Location: Leg Pain Orientation: Left Pain Descriptors / Indicators: Aching Pain Intervention(s): RN made aware;Repositioned;Ambulation/increased activity  See FIM for current functional status  Therapy/Group: Individual Therapy  [name], [name] 06/14/2015, 7:17 AM

## 2015-06-15 ENCOUNTER — Inpatient Hospital Stay (HOSPITAL_COMMUNITY): Payer: Self-pay | Admitting: Physical Therapy

## 2015-06-15 ENCOUNTER — Inpatient Hospital Stay (HOSPITAL_COMMUNITY): Payer: Self-pay | Admitting: Occupational Therapy

## 2015-06-15 LAB — GLUCOSE, CAPILLARY
GLUCOSE-CAPILLARY: 275 mg/dL — AB (ref 65–99)
GLUCOSE-CAPILLARY: 183 mg/dL — AB (ref 65–99)
Glucose-Capillary: 246 mg/dL — ABNORMAL HIGH (ref 65–99)
Glucose-Capillary: 171 mg/dL — ABNORMAL HIGH (ref 65–99)

## 2015-06-15 NOTE — Progress Notes (Signed)
[name] is a 49 y.o. female 1966/02/14 [id number]  Subjective: No complaints or reported problems. Slept well. Feeling OK.  Objective: Vital signs in last 24 hours: Temp:  [98.4 F (36.9 C)-98.6 F (37 C)] 98.4 F (36.9 C) (07/16 0629) Pulse Rate:  [84-87] 84 (07/16 0629) Resp:  [16-18] 18 (07/16 0629) BP: (105-119)/(56-59) 119/59 mmHg (07/16 0629) SpO2:  [98 %-100 %] 98 % (07/16 0629) Weight change:  Last BM Date: 06/14/15  Intake/Output from previous day: 07/15 0701 - 07/16 0700 In: 530 [P.O.:480; IV Piggyback:50] Out: -   Physical Exam General: No apparent distress   In [facility], at sink - speaks limited English,  No assistant at side Lungs: Normal effort. Lungs clear to auscultation, no crackles or wheezes. Cardiovascular: Regular rate and rhythm, no edema Neurological: No new neurological deficits Wounds: BKA dressing  Clean, dry, intact. No signs of infection.  Lab Results: BMET    Component Value Date/Time   NA 139 06/13/2015 0608   K 3.4* 06/13/2015 0608   CL 103 06/13/2015 0608   CO2 29 06/13/2015 0608   GLUCOSE 243* 06/13/2015 0608   BUN <5* 06/13/2015 0608   CREATININE 0.45 06/13/2015 0608   CREATININE 0.68 12/20/2014 1132   CALCIUM 8.2* 06/13/2015 0608   GFRNONAA >60 06/13/2015 0608   GFRNONAA >89 12/20/2014 1132   GFRAA >60 06/13/2015 0608   GFRAA >89 12/20/2014 1132   CBC    Component Value Date/Time   WBC 6.5 06/13/2015 0608   WBC 5.9 10/05/2012 1336   WBC 4.0 05/21/2011 1417   RBC 2.70* 06/13/2015 0608   RBC 3.86* 03/21/2015 0722   RBC 4.10 10/05/2012 1336   RBC 4.18 05/21/2011 1417   HGB 7.8* 06/13/2015 0608   HGB 11.9* 10/05/2012 1336   HGB 12.7 05/21/2011 1417   HCT 23.8* 06/13/2015 0608   HCT 39.2 10/05/2012 1336   HCT 36.7 05/21/2011 1417   PLT 561* 06/13/2015 0608   PLT 307 05/21/2011 1417   MCV 88.1 06/13/2015 0608   MCV 95.6 10/05/2012 1336   MCV 87.8 05/21/2011 1417   MCH 28.9 06/13/2015 0608   MCH 29.0 10/05/2012  1336   MCH 30.4 05/21/2011 1417   MCHC 32.8 06/13/2015 0608   MCHC 30.4* 10/05/2012 1336   MCHC 34.6 05/21/2011 1417   RDW 13.5 06/13/2015 0608   RDW 12.9 05/21/2011 1417   LYMPHSABS 1.6 06/13/2015 0608   LYMPHSABS 1.5 05/21/2011 1417   MONOABS 0.5 06/13/2015 0608   MONOABS 0.3 05/21/2011 1417   EOSABS 0.1 06/13/2015 0608   EOSABS 0.2 05/21/2011 1417   BASOSABS 0.1 06/13/2015 0608   BASOSABS 0.0 05/21/2011 1417   CBG's (last 3):   Recent Labs  06/14/15 1650 06/14/15 2130 06/15/15 0655  GLUCAP 284* 189* 246*   LFT's Lab Results  Component Value Date   ALT 11* 06/13/2015   AST 21 06/13/2015   ALKPHOS 93 06/13/2015   BILITOT 0.3 06/13/2015    Studies/Results: No results found.  Medications:  I have reviewed the patient's current medications. Scheduled Medications: .  ceFAZolin (ANCEF) IV  2 g Intravenous 3 times per day  . enoxaparin (LOVENOX) injection  40 mg Subcutaneous QHS  . feeding supplement (PRO-STAT SUGAR FREE 64)  30 mL Oral TID  . insulin aspart  0-15 Units Subcutaneous TID WC  . insulin aspart  0-5 Units Subcutaneous QHS  . insulin glargine  5 Units Subcutaneous QHS  . iron polysaccharides  150 mg Oral q12n4p  . magic mouthwash  w/lidocaine  5 mL Oral QID  . multivitamin with minerals  1 tablet Oral Daily  . polycarbophil  625 mg Oral BID  . potassium chloride  10 mEq Oral BID  . saccharomyces boulardii  250 mg Oral BID  . simethicone  80 mg Oral QID  . sodium chloride  10-40 mL Intracatheter Q12H   PRN Medications: acetaminophen, alum & mag hydroxide-simeth, bisacodyl, diphenhydrAMINE, guaiFENesin-dextromethorphan, menthol-cetylpyridinium, methocarbamol, oxyCODONE, prochlorperazine **OR** prochlorperazine **OR** prochlorperazine, senna-docusate, sodium chloride, traZODone  Assessment/Plan: Principal Problem:   Status post below knee amputation of left lower extremity Active Problems:   DM type 2 (diabetes mellitus, type 2)   Diabetes mellitus  type 2, controlled   Staphylococcus aureus bacteremia   1. Functional deficits secondary to L-BKA - continue IP rehab therapies as ongoing 2. DVT Prophylaxis/Anticoagulation: Pharmaceutical: Lovenox---continue given VTE risk 3. Pain Management: Oxycodone prn appears sufficient  4. Mood: Team to provide ego support. LCSW to follow for evaluation and support.  5. Neuropsych: This patient is capable of making decisions on her own behalf. 6. Skin/Wound Care: Routine pressure relief measures. Monitor wound for healing.  7. Fluids/Electrolytes/Nutrition: Monitor I/O. modest replace K+  8. Acute on Chronic iron deficiency anemia: Baseline Hgb- 9.9. Asymptomatic -hgb holding at 7.8---no signs of blood loss on hx or exam 9. DM type 2: Will monitor BS ac/hs. Continue lantus and titrated to home dose as indicated. Will use SSI for elevated BS.  10. MSSA bacteremia with SIRS reaction: Continue IV ancef through 8/2; ID sign off 7/15 but can call prn while IP -afebrile 11. Protein malnutrition: protein supplement.  12. Diarrhea with incontinence: No abdominal distension noted, stools now formed. C diff sample pending. Added probiotic and bulking agent.  Length of stay, days: 3  Valerie A. Asa Lente, MD 06/15/2015, 9:49 AM

## 2015-06-15 NOTE — Progress Notes (Signed)
Physical Therapy Note  Patient Details  Name: [name] MRN: [id number] Date of Birth: 15-Jan-1966 Today's Date: 06/15/2015    Time: 1015-1133 78 minutes  1:1 No c/o pain.  Pt performed w/c mobility throughout unit with supervision. Pt performed set up for w/c transfers and parts management with min verbal and tactile cues. Car transfer performed with supervision, mod cues for safety and set up, pt impulsive with movements.  Supine therex for LE strengthening bilaterally 30 x SAQ, SLR, hip abd/add, glut squeeze, UE flexion, abd/adduction.  Sit to stands 2 x 10 for strengthening.  Gait with RW 25' x 2 with min A, frequent rests due to fatigue.  UBE 3 min fwd, 2 min bkwd with frequent rest breaks. Pt unable to tolerate full session due to fatigue.  PT provided pt with handout of HEP with pictures for pt to perform at home, pt expresses understanding.   Nalla Purdy 06/15/2015, 11:38 AM

## 2015-06-15 NOTE — Progress Notes (Signed)
Occupational Therapy Session Note  Patient Details  Name: [name] MRN: [id number] Date of Birth: 26-Aug-1966  Today's Date: 06/15/2015 OT Individual Time: 6812-7517 OT Individual Time Calculation (min): 69 min  and Today's Date: 06/15/2015 OT Missed Time: 21 Minutes Missed Time Reason: Patient fatigue   Short Term Goals: Week 1:  OT Short Term Goal 1 (Week 1): STG=LTG due to estimated LOS  Skilled Therapeutic Interventions/Progress Updates:  Upon entering the room, pt supine in bed with no c/o pain. Interpreter present during session. Pt declined bathing and dressing this session. OT educated pt on importance of practicing these tasks for safety. Pt verbalizing she will do them with assistance from husband. Therapist educated pt that husband must come in for education from therapist in order to insure her safety. Pt verbalized understanding. Pt performed supine >sit with no assist and min A stand pivot into wheelchair from bed. Pt propelled wheelchair to sink for grooming tasks while seated at wheelchair level. 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 OT educated pt on techniques such as rubbing and tapping the stump in order to decrease pain. Pt returned demonstration and verbalized understanding. Pt propelled wheelchair to elevator and out side 300'+ with supervision and 1 rest break secondary to fatigue. Pt engaged hopping with RW 15' and back  15' to wheelchair with min A for balance and pt stopping once to take standing rest break. Pt performed this tasks on uneven pavement.  Pt very fatigued and requiring rest break. Pt required min verbal guidance cues for wheelchair safety and set up during transfers this session. Pt unable to navigate back to room without assistance but propelled wheelchair for continued B UE endurance and strengthening. Wheelchair alarm set and RN entering the room to check pt IV's as therapist exiting the room.   Therapy  Documentation Precautions:  Precautions Precautions: Fall Precaution Comments: L BKA Restrictions Weight Bearing Restrictions: Yes LLE Weight Bearing: Non weight bearing General: General OT Amount of Missed Time: 21 Minutes Vital Signs: Therapy Vitals Temp: 98.4 F (36.9 C) Temp Source: Oral Pulse Rate: 84 Resp: 18 BP: (!) 119/59 mmHg Patient Position (if appropriate): Lying Oxygen Therapy SpO2: 98 % O2 Device: Not Delivered  See FIM for current functional status  Therapy/Group: Individual Therapy  Phineas Semen 06/15/2015, 10:04 AM

## 2015-06-16 ENCOUNTER — Inpatient Hospital Stay (HOSPITAL_COMMUNITY): Payer: Self-pay | Admitting: Physical Therapy

## 2015-06-16 LAB — GLUCOSE, CAPILLARY
GLUCOSE-CAPILLARY: 275 mg/dL — AB (ref 65–99)
Glucose-Capillary: 244 mg/dL — ABNORMAL HIGH (ref 65–99)
Glucose-Capillary: 204 mg/dL — ABNORMAL HIGH (ref 65–99)
Glucose-Capillary: 236 mg/dL — ABNORMAL HIGH (ref 65–99)

## 2015-06-16 NOTE — Progress Notes (Signed)
[name] is a 49 y.o. female [birth year] [id number]  Subjective: No new problems per RN. Slept well. Feeling fine and denies pain.  Objective: Vital signs in last 24 hours: Temp:  [98.3 F (36.8 C)-98.4 F (36.9 C)] 98.3 F (36.8 C) (07/17 0550) Pulse Rate:  [81-89] 81 (07/17 0550) Resp:  [17-18] 18 (07/17 0550) BP: (111-119)/(53-55) 119/55 mmHg (07/17 0550) SpO2:  [98 %-99 %] 98 % (07/17 0550) Weight change:  Last BM Date: 06/15/15  Intake/Output from previous day: 07/16 0701 - 07/17 0700 In: 850 [P.O.:840; I.V.:10] Out: -   Physical Exam General: No apparent distress    Lungs: Normal effort. Lungs clear to auscultation, no crackles or wheezes. Cardiovascular: Regular rate and rhythm, no edema   Lab Results: BMET    Component Value Date/Time   NA 139 06/13/2015 0608   K 3.4* 06/13/2015 0608   CL 103 06/13/2015 0608   CO2 29 06/13/2015 0608   GLUCOSE 243* 06/13/2015 0608   BUN <5* 06/13/2015 0608   CREATININE 0.45 06/13/2015 0608   CREATININE 0.68 12/20/2014 1132   CALCIUM 8.2* 06/13/2015 0608   GFRNONAA >60 06/13/2015 0608   GFRNONAA >89 12/20/2014 1132   GFRAA >60 06/13/2015 0608   GFRAA >89 12/20/2014 1132   CBC    Component Value Date/Time   WBC 6.5 06/13/2015 0608   WBC 5.9 10/05/2012 1336   WBC 4.0 05/21/2011 1417   RBC 2.70* 06/13/2015 0608   RBC 3.86* 03/21/2015 0722   RBC 4.10 10/05/2012 1336   RBC 4.18 05/21/2011 1417   HGB 7.8* 06/13/2015 0608   HGB 11.9* 10/05/2012 1336   HGB 12.7 05/21/2011 1417   HCT 23.8* 06/13/2015 0608   HCT 39.2 10/05/2012 1336   HCT 36.7 05/21/2011 1417   PLT 561* 06/13/2015 0608   PLT 307 05/21/2011 1417   MCV 88.1 06/13/2015 0608   MCV 95.6 10/05/2012 1336   MCV 87.8 05/21/2011 1417   MCH 28.9 06/13/2015 0608   MCH 29.0 10/05/2012 1336   MCH 30.4 05/21/2011 1417   MCHC 32.8 06/13/2015 0608   MCHC 30.4* 10/05/2012 1336   MCHC 34.6 05/21/2011 1417   RDW 13.5 06/13/2015 0608   RDW 12.9 05/21/2011  1417   LYMPHSABS 1.6 06/13/2015 0608   LYMPHSABS 1.5 05/21/2011 1417   MONOABS 0.5 06/13/2015 0608   MONOABS 0.3 05/21/2011 1417   EOSABS 0.1 06/13/2015 0608   EOSABS 0.2 05/21/2011 1417   BASOSABS 0.1 06/13/2015 0608   BASOSABS 0.0 05/21/2011 1417   CBG's (last 3):   Recent Labs  06/15/15 1631 06/15/15 2040 06/16/15 0636  GLUCAP 171* 183* 244*   LFT's Lab Results  Component Value Date   ALT 11* 06/13/2015   AST 21 06/13/2015   ALKPHOS 93 06/13/2015   BILITOT 0.3 06/13/2015    Studies/Results: No results found.  Medications:  I have reviewed the patient's current medications. Scheduled Medications: .  ceFAZolin (ANCEF) IV  2 g Intravenous 3 times per day  . enoxaparin (LOVENOX) injection  40 mg Subcutaneous QHS  . feeding supplement (PRO-STAT SUGAR FREE 64)  30 mL Oral TID  . insulin aspart  0-15 Units Subcutaneous TID WC  . insulin aspart  0-5 Units Subcutaneous QHS  . insulin glargine  5 Units Subcutaneous QHS  . iron polysaccharides  150 mg Oral q12n4p  . magic mouthwash w/lidocaine  5 mL Oral QID  . multivitamin with minerals  1 tablet Oral Daily  . polycarbophil  625 mg Oral BID  .  saccharomyces boulardii  250 mg Oral BID  . simethicone  80 mg Oral QID  . sodium chloride  10-40 mL Intracatheter Q12H   PRN Medications: acetaminophen, alum & mag hydroxide-simeth, bisacodyl, diphenhydrAMINE, guaiFENesin-dextromethorphan, menthol-cetylpyridinium, methocarbamol, oxyCODONE, prochlorperazine **OR** prochlorperazine **OR** prochlorperazine, senna-docusate, sodium chloride, traZODone  Assessment/Plan: Principal Problem:   Status post below knee amputation of left lower extremity Active Problems:   DM type 2 (diabetes mellitus, type 2)   Diabetes mellitus type 2, controlled   Staphylococcus aureus bacteremia   1. Functional deficits secondary to L-BKA - continue IP rehab therapies as ongoing 2. DVT Prophylaxis/Anticoagulation: Pharmaceutical:  Lovenox---continue given VTE risk 3. Pain Management: Oxycodone prn appears sufficient  4. Mood: Team to provide ego support. LCSW to follow for evaluation and support.  5. Neuropsych: This patient is capable of making decisions on her own behalf. 6. Skin/Wound Care: Routine pressure relief measures. Monitor wound for healing.  7. Fluids/Electrolytes/Nutrition: Monitor I/O. modest replace K+; Recheck in AM 8. Acute on Chronic iron deficiency anemia: Baseline Hgb- 9.9. Asymptomatic -hgb holding at 7.8---no signs of blood loss on hx or exam 9. DM type 2: Will monitor BS ac/hs. Continue lantus and titrated to home dose as indicated. Will use SSI for elevated BS.  10. MSSA bacteremia with SIRS reaction: Continue IV ancef through 8/2; ID sign off 7/15 but can call prn while IP -afebrile 11. Protein malnutrition: protein supplement.  12. Diarrhea with incontinence: No abdominal distension noted, stools now formed. Continue probiotic and bulking agent.   Length of stay, days: 4  [name] A. Felicity Coyer, MD 06/16/2015, 9:19 AM

## 2015-06-16 NOTE — Progress Notes (Signed)
Physical Therapy Note  Patient Details  Name: [name] MRN: [id number] Date of Birth: [birth year] Today's Date: 06/16/2015    Time: 830-908 38 minutes  1:1: No c/o pain. Pt performed squat pivot transfer bed to w/c with supervision, cues for safety. W/c mobility in home and controlled environments with obstacle negotiation with supervision.  W/c set up for transfers continues to require cuing for safety and proper technique, pt improving with parts management of w/c.  Seated abdominal strengthening exercises with trunk PNFs 2 x 20 bilat.  UBE for strengthening 2 min fwd/2 min bkwd. Gait with RW 15' x 2. Pt c/o fatigue frequently during session, required frequent rests.   DONAWERTH,KAREN 06/16/2015, 9:05 AM

## 2015-06-17 ENCOUNTER — Inpatient Hospital Stay (HOSPITAL_COMMUNITY): Payer: Self-pay

## 2015-06-17 ENCOUNTER — Inpatient Hospital Stay (HOSPITAL_COMMUNITY): Payer: Self-pay | Admitting: Occupational Therapy

## 2015-06-17 DIAGNOSIS — E11349 Type 2 diabetes mellitus with severe nonproliferative diabetic retinopathy without macular edema: Secondary | ICD-10-CM

## 2015-06-17 LAB — GLUCOSE, CAPILLARY
GLUCOSE-CAPILLARY: 324 mg/dL — AB (ref 65–99)
GLUCOSE-CAPILLARY: 100 mg/dL — AB (ref 65–99)
GLUCOSE-CAPILLARY: 326 mg/dL — AB (ref 65–99)
Glucose-Capillary: 281 mg/dL — ABNORMAL HIGH (ref 65–99)

## 2015-06-17 LAB — CBC WITH DIFFERENTIAL/PLATELET
BASOS PCT: 2 % — AB (ref 0–1)
Basophils Absolute: 0.1 10*3/uL (ref 0.0–0.1)
Eosinophils Absolute: 0.2 10*3/uL (ref 0.0–0.7)
Eosinophils Relative: 4 % (ref 0–5)
HCT: 25 % — ABNORMAL LOW (ref 36.0–46.0)
Hemoglobin: 8 g/dL — ABNORMAL LOW (ref 12.0–15.0)
Lymphocytes Relative: 42 % (ref 12–46)
Lymphs Abs: 2.1 10*3/uL (ref 0.7–4.0)
MCH: 28.5 pg (ref 26.0–34.0)
MCHC: 32 g/dL (ref 30.0–36.0)
MCV: 89 fL (ref 78.0–100.0)
MONO ABS: 0.4 10*3/uL (ref 0.1–1.0)
Monocytes Relative: 7 % (ref 3–12)
NEUTROS ABS: 2.2 10*3/uL (ref 1.7–7.7)
Neutrophils Relative %: 45 % (ref 43–77)
PLATELETS: 530 10*3/uL — AB (ref 150–400)
RBC: 2.81 MIL/uL — ABNORMAL LOW (ref 3.87–5.11)
RDW: 13.7 % (ref 11.5–15.5)
WBC: 4.9 10*3/uL (ref 4.0–10.5)

## 2015-06-17 LAB — BASIC METABOLIC PANEL
Anion gap: 6 (ref 5–15)
BUN: 16 mg/dL (ref 6–20)
CO2: 29 mmol/L (ref 22–32)
Calcium: 8.1 mg/dL — ABNORMAL LOW (ref 8.9–10.3)
Chloride: 102 mmol/L (ref 101–111)
Creatinine, Ser: 0.65 mg/dL (ref 0.44–1.00)
GFR calc Af Amer: >60 mL/min (ref >60)
GFR calc non Af Amer: >60 mL/min (ref >60)
GLUCOSE: 351 mg/dL — AB (ref 65–99)
POTASSIUM: 4.3 mmol/L (ref 3.5–5.1)
SODIUM: 137 mmol/L (ref 135–145)

## 2015-06-17 MED ORDER — TRAMADOL HCL 50 MG PO TABS
25.0000 mg | ORAL_TABLET | Freq: Four times a day (QID) | ORAL | Status: DC | PRN
Start: 2015-06-17 — End: 2015-06-18

## 2015-06-17 NOTE — Progress Notes (Signed)
Physical Therapy Session Note  Patient Details  Name: [name] MRN: [id number] Date of Birth: [birth year]  Today's Date: 06/17/2015 PT Individual Time: 1000-1100 PT Individual Time Calculation (min): 60 min   Short Term Goals: Week 1:  PT Short Term Goal 1 (Week 1): = LTGs  Skilled Therapeutic Interventions/Progress Updates:   Denies pain. Session focused on reassessing functional mobility including transfers, gait with RW, stair negotiation to simulate home entry, car transfer and w/c mobility/parts management. Pt initially asking why we had to do all this again since she has learned it already. Therapist educated patient on importance of checking off goals in order to prepare for d/c home especially if she will only have intermittent assistance available and pt unable to receive HHPT follow up due to insurance. Pt agreeable to participate.  Pt able to propel w/c on unit at mod I level with extra time due to easily fatiguing. Transfer training in ADL apartment to simulate home environment mobility which pt able to perform transfers at mod I level though in a manner less energy efficient and more difficult than therapist trying to teach patient. Demonstrated correct placement of w/c parallel or at an angle instead of perpendicular to make transfer smoother which pt able to return demonstrate appropriately at mod I level on bed and then onto couch to simulate furniture transfers in the home. Pt appears to prefer to do things her way despite education on more efficient and safer technique. Discussed with pt recommendation for pt to only perform w/c level mobility and transfers at home unless husband present to provide close S/steady A for gait in the home to which pt verbalized understanding and in agreement. Pt able to gait on carpeted surface in apartment with close S and cues for safety but when turning require 1 episode of min A to regain balance. Also recommended for pt to keep R legrest  off of w/c in the home to decrease fall risk as pt often forget to remove it before a transfer and does not necessarily need it. Recommended to keep residual limb legrest on at all times for correct alignment and positioning.   Performed simulated car transfer at S level with cues for w/c set-up to improve efficiency of transfer. Stair negotiation simulation using R rail and transfer to step and bumping up/down on bottom with min A for sit to stand from floor. Asked if pt's husband around this weekend in therapy to perform training but pt reports he had to work. Planning for family education to occur tomorrow prior to d/c. Ok for pt to transfer in the room bed <-> w/c but discussed with her that toilet transfer need to be cleared with OT. RN also made aware.   Therapy Documentation Precautions:  Precautions Precautions: Fall Precaution Comments: L BKA Restrictions Weight Bearing Restrictions: Yes LLE Weight Bearing: Non weight bearing  See FIM for current functional status  Therapy/Group: Individual Therapy  [name] Ivory Broad, PT, DPT  06/17/2015, 3:47 PM

## 2015-06-17 NOTE — Progress Notes (Signed)
Patient educated on importance of keeping dressing on wound. She has developed edema with serous drainage laterally and evidence of early blistering. No fever and WBC stable but will likely need to continue IV antibiotics at discharge. To continue with IV antibiotics for two additional weeks till Aug 2nd (original plan) and ask ortho to evaluate wound prior to discharge tomorrow.

## 2015-06-17 NOTE — Progress Notes (Signed)
Incision inspected today.  The skin edges are viable.  Scant serous drainage c/w abx beads.  No signs of continued infection.  Agree with 2 additional weeks of IV abx.  Dry dressing reapplied.  Needs to elevate and change dressing once daily.  F/u in office next week as scheduled.  [name], MD [facility] [phone] 2:34 PM

## 2015-06-17 NOTE — Progress Notes (Signed)
Physical Therapy Session Note & Discharge Summary  Patient Details  Name: [name] MRN: [id number] Date of Birth: 03/23/1966  Today's Date: 06/18/2015 PT Individual Time: 1000-1047 PT Individual Time Calculation (min): 47 min  Individual therapy; Denies pain. Pt's husband present for family education and medical interpreter present to translate during session. Pt and husband very anxious to leave hospital and reluctant to participate in session today (ultimately missing last 13 min due to refusal to continue to participate). Educated and performed training on DME breakdown and adjustments of w/c, tub bench and RW, car transfer at S level, w/c parts breakdown and management, stair negotiation training (transfer to floor and bump up on bottom) at close S level, and gait training with RW at close S level with pt able to go 44' today. Reviewed HEP with pt and family and importance of continued HEP and mobility. Also educated on recommendation for only performing gait with husband present at home to provide S in the home as well to maintain dressing on open wound as pt had it off again during this session. Educated on risk of infection to which she verbalized understanding.    Patient has met 9 of 9 long term goals due to improved activity tolerance, improved balance, increased strength, decreased pain and ability to compensate for deficits.  Patient to discharge at a wheelchair level Modified Independent to S.   Patient's care partner is independent to provide the necessary supervision assistance at discharge.  Reasons goals not met: n/a - all goals met at this time  Recommendation:  Patient will benefit from ongoing skilled PT services in home health setting, though pt unable to receive these services per insurance, to continue to advance safe functional mobility, address ongoing impairments in gait, balance, pre-prosthetic training, strength, and minimize fall risk. HEP and recommendations  for mobility made to patient due to inability to obtain HHPT at this time.  Equipment: 16x16 w/c with basic cushion; youth RW  Reasons for discharge: treatment goals met and discharge from hospital  Patient/family agrees with progress made and goals achieved: Yes  PT Discharge Precautions/Restrictions Precautions Precautions: Fall Precaution Comments: L BKA Restrictions Weight Bearing Restrictions: Yes LLE Weight Bearing: Non weight bearing  Cognition Overall Cognitive Status: Within Functional Limits for tasks assessed Safety/Judgment: Appears intact Comments: At times pt is somewhat impulsive and prefers to perform mobility in her way despite education and cues for improved technique.  Sensation Sensation Light Touch: Appears Intact Coordination Gross Motor Movements are Fluid and Coordinated: Yes Motor  Motor Motor: Within Functional Limits  Trunk/Postural Assessment  Cervical Assessment Cervical Assessment: Within Functional Limits Thoracic Assessment Thoracic Assessment: Within Functional Limits Lumbar Assessment Lumbar Assessment: Within Functional Limits Postural Control Postural Control: Within Functional Limits  Balance Balance Balance Assessed: Yes Static Sitting Balance Static Sitting - Level of Assistance: 6: Modified independent (Device/Increase time) Dynamic Sitting Balance Dynamic Sitting - Level of Assistance: 6: Modified independent (Device/Increase time) Static Standing Balance Static Standing - Level of Assistance: 6: Modified independent (Device/Increase time) Dynamic Standing Balance Dynamic Standing - Level of Assistance: 6: Modified independent (Device/Increase time); 5: Supervision Extremity Assessment  RUE Assessment RUE Assessment: Within Functional Limits LUE Assessment LUE Assessment: Within Functional Limits RLE Assessment RLE Assessment: Within Functional Limits (decreased muscular endurance) LLE Assessment LLE Assessment:  Exceptions to [facility] (grossly 3+/5; L BKA new)  See FIM for current functional status  [name] Ivory Broad, PT, DPT  06/18/2015, 12:28 PM

## 2015-06-17 NOTE — Progress Notes (Signed)
[facility] PHYSICAL MEDICINE & REHABILITATION     PROGRESS NOTE    Subjective/Complaints: Asks when she can walk. Anxious to get home! Pain under control.  ROS: Pt denies fever, rash/itching, headache, blurred or double vision, nausea, vomiting, abdominal pain, diarrhea, chest pain, shortness of breath, palpitations, dysuria, dizziness, neck or back pain, bleeding, anxiety, or depression   Objective: Vital Signs: Blood pressure 106/59, pulse 92, temperature 98.7 F (37.1 C), temperature source Oral, resp. rate 18, weight 52.073 kg (114 lb 12.8 oz), last menstrual period 05/12/2013, SpO2 100 %. No results found.  Recent Labs  06/17/15 0540  WBC 4.9  HGB 8.0*  HCT 25.0*  PLT 530*    Recent Labs  06/17/15 0540  NA 137  K 4.3  CL 102  GLUCOSE 351*  BUN 16  CREATININE 0.65  CALCIUM 8.1*   CBG (last 3)   Recent Labs  06/16/15 1642 06/16/15 2109 06/17/15 0654  GLUCAP 236* 275* 324*    Wt Readings from Last 3 Encounters:  06/12/15 52.073 kg (114 lb 12.8 oz)  06/06/15 52.5 kg (115 lb 11.9 oz)  03/21/15 60.328 kg (133 lb)    Physical Exam:  Nursing note and vitals reviewed. Constitutional: She is oriented to person, place, and time. She appears well-developed and well-nourished.   HENT: Head: Normocephalic and atraumatic.  Eyes: Conjunctivae are normal. Pupils are equal, round, and reactive to light.  Neck: Normal range of motion. Neck supple.  Cardiovascular: Normal rate and regular rhythm. no murmur Respiratory: Effort normal and breath sounds normal. No respiratory distress. She has no wheezes.  GI: Soft. Bowel sounds are normal. She exhibits no distension.  Musculoskeletal: She exhibits no tenderness.  L-BKA with some drainage medially---wound well approximated Neurological: She is alert and oriented to person, place, and time.  No dysarthria or slurring noted. Able to follow basic motor commands without difficulty. Moves BUE, RLE and Left residual  limb without difficulty. UE 4/5. RLE 3/5 HF,KE and 4/5 adf/ LLE: HF 3/5. No sensory deficits.  Skin: Skin is warm and dry.  Psychiatric: She has a normal mood and affect. Her speech is normal and behavior is normal.   Assessment/Plan: 1. Functional deficits secondary to left BKA which require 3+ hours per day of interdisciplinary therapy in a comprehensive inpatient rehab setting. Physiatrist is providing close team supervision and 24 hour management of active medical problems listed below. Physiatrist and rehab team continue to assess barriers to discharge/monitor patient progress toward functional and medical goals. FIM: FIM - Bathing Bathing Steps Patient Completed: Chest, Abdomen, Right upper leg, Front perineal area, Right Arm, Left Arm, Buttocks, Right lower leg (including foot), Left upper leg Bathing: 4: Steadying assist  FIM - Upper Body Dressing/Undressing Upper body dressing/undressing steps patient completed: Thread/unthread right sleeve of pullover shirt/dresss, Thread/unthread left sleeve of pullover shirt/dress, Put head through opening of pull over shirt/dress, Pull shirt over trunk Upper body dressing/undressing: 5: Set-up assist to: Obtain clothing/put away FIM - Lower Body Dressing/Undressing Lower body dressing/undressing steps patient completed: Thread/unthread right pants leg, Thread/unthread left pants leg, Pull pants up/down, Don/Doff right sock Lower body dressing/undressing: 4: Steadying Assist  FIM - Toileting Toileting steps completed by patient: Adjust clothing prior to toileting, Performs perineal hygiene, Adjust clothing after toileting Toileting Assistive Devices: Grab bar or rail for support Toileting: 4: Steadying assist  FIM - Radio producer Devices: Grab bars Toilet Transfers: 5-To toilet/BSC: Supervision (verbal cues/safety issues), 4-From toilet/BSC: Min A (steadying Pt. > 75%)  FIM - Ship broker Devices: Arm rests Bed/Chair Transfer: 6: Supine > Sit: No assist, 4: Bed > Chair or W/C: Min A (steadying Pt. > 75%)  FIM - Locomotion: Wheelchair Locomotion: Wheelchair: 5: Travels 150 ft or more: maneuvers on rugs and over door sills with supervision, cueing or coaxing FIM - Locomotion: Ambulation Locomotion: Ambulation Assistive Devices: Walker - Rolling Locomotion: Ambulation: 1: Travels less than 50 ft with minimal assistance (Pt.>75%)  Comprehension Comprehension Mode: Auditory Comprehension: 5-Understands basic 90% of the time/requires cueing < 10% of the time  Expression Expression Mode: Verbal Expression: 4-Expresses basic 75 - 89% of the time/requires cueing 10 - 24% of the time. Needs helper to occlude trach/needs to repeat words.  Social Interaction Social Interaction: 4-Interacts appropriately 75 - 89% of the time - Needs redirection for appropriate language or to initiate interaction.  Problem Solving Problem Solving: 4-Solves basic 75 - 89% of the time/requires cueing 10 - 24% of the time  Memory Memory: 6-More than reasonable amt of time  Medical Problem List and Plan: 1. 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 DVT Prophylaxis/Anticoagulation: Pharmaceutical: Lovenox---continue given VTE risk 3. Pain Management: Oxycodone prn seems to be holding her 4. Mood: Team to provide ego support. LCSW to follow for evaluation and support.  5. Neuropsych: This patient is capable of making decisions on her own behalf. 6. Skin/Wound Care: dry dressing daily with ACE to left leg 7. Fluids/Electrolytes/Nutrition: Monitor I/O. supplemented K+  8. Acute on Chronic iron deficiency anemia: Baseline Hgb- 9.9. Asymptomatic  -hgb holding at 7.8--  9. DM type 2: Will monitor BS ac/hs. Continue lantus and titrated to home dose as indicated. Will use SSI for elevated BS.  10. MSSA bacteremia with SIRS reaction: Continue IV ancef for 3 additional weeks (through  8/2)  -afebrile 11. Protein malnutrition:   protein supplement.  12. Diarrhea with incontinence: No abdominal distension noted, stools now formed. C diff sample pending. Added probiotic and bulking agent.    LOS (Days) 5 A FACE TO FACE EVALUATION WAS PERFORMED  Shamya Macfadden T 06/17/2015 8:53 AM

## 2015-06-17 NOTE — Progress Notes (Signed)
Occupational Therapy Session Note  Patient Details  Name: [name] MRN: [id number] Date of Birth: 01-22-1966  Today's Date: 06/17/2015 OT Individual Time: 0900-1000 OT Individual Time Calculation (min): 60 min    Short Term Goals: Week 1:  OT Short Term Goal 1 (Week 1): STG=LTG due to estimated LOS  Skilled Therapeutic Interventions/Progress Updates:    Pt propelled to ADL apartment to practice tub bench transfers and simple kitchen tasks at w/c level and standing at counter.  Pt requires mod verbal cues for safety awareness and is reluctant to follow recommendations of therapist regarding compensatory strategies.  Pt requires max verbal cues for safety awareness with functional transfers although pt completes transfers with supervision.  Pt transitioned to dynamic standing activities using RW for support.  Focus on continued safety awareness for transitional movements.  Therapy Documentation Precautions:  Precautions Precautions: Fall Precaution Comments: L BKA Restrictions Weight Bearing Restrictions: Yes LLE Weight Bearing: Non weight bearing  Pain: Pain Assessment Pain Assessment: No/denies pain  See FIM for current functional status  Therapy/Group: Individual Therapy  Leroy Libman 06/17/2015, 10:04 AM

## 2015-06-17 NOTE — Progress Notes (Addendum)
Dressing changed x 3 ( over 12 hr. period)  with moderate amounts serous, blood tinged drainage, no odor. Sutures intact: Between 7-8th sutures small 'tear' at line.  Small fluid filled blister at outer lateral incision line. Pt. and husband educated re: compression wrap. Demonstrated technique, husband to return demo prior to D/C tomorrow.  Reviewed S/S infection, including  drainage, changes in incision line, pain and temp..Interpreter present throughout.

## 2015-06-17 NOTE — Progress Notes (Signed)
Occupational Therapy Session Note  Patient Details  Name: [name] MRN: [id number] Date of Birth: 1966-07-24  Today's Date: 06/17/2015 OT Individual Time: 1100-1200 and 1400-1430 OT Individual Time Calculation (min): 60 min and 30 min   Short Term Goals: Week 1:  OT Short Term Goal 1 (Week 1): STG=LTG due to estimated LOS  Skilled Therapeutic Interventions/Progress Updates:    Session One: Pt seen for OT session focusing on functional mobility, DME, home safety, functional transfers, and d/Townsend planning. Pt in w/Townsend upon arrival with interpreter present. Pt educated regarding recommendation for drop arm BSC for use at d/Townsend, and education regarding management of w/Townsend parts and how to use drop arm feature of BSC. Pt completed transfer w/Townsend <> BSC x2 trials with max VCs for w/Townsend management parts on both trials as pt with decreased carry over of learning, and insisting to do it " her way" rather than following OT recommendations. Pt educated regarding need for safety awareness, fall risk, and how to decrease risk of falls. Pt return demonstrated recommended transfer method with min VCs. Pt believed she would be able to fit standard w/Townsend into home bathroom, however, after setting up replicated bathroom, pt no longer believes she will have area to turn w/Townsend in bathroom. Recommending pt using 3-1 BSC during the day when left alone and allowed to ambulate into bathroom with RW and supervision when someone is available to help. Pt ambulated into regular bathroom, ~63ft, with RW and supervision completing toilet transfer and hand hygiene with VCs for RW management. Pt self propelled w/Townsend to therapy day room where she stood with supervision to water plants, VCs for RW management and for fall risk. Pt fatiguing towards end of standing trial causing 1 LOB episode with min-mod A to regain balance. Pt returned to w/Townsend and self propelled w/Townsend back to room. Pt left sitting up in w/Townsend at end of session set-up with meal tray  and all needs in reach.  Pt educated regarding fall risk, drop arm 3-1 BSC, tub shower bench, need for assist, energy conservation, decreasing falls, use of call bell, and d/Townsend planning.  Session Two: Pt seen for OT therapy session focusing on UE strengthening and establishment of HEP. Pt in w/Townsend upon arrival, agreeable to tx, no interpreter present. Pt at sink completing hand hygiene upon arrival, having just completed toileting task. Education provided regarding need for assist when completing toileting, and educated regarding use of call bell, pt voiced understanding. Pt self propelled w/Townsend to therapy gym and transferred onto mat with supervision. Pt with no carry over of technique for proper set-up of w/Townsend and equipment prior to transfer, however was able to complete transfer safely.  Seated on EOM, pt completed UE strengthening/ ROM exercises using level II theraband. Pt educated regarding exercises of chest expansion, bicep curls, seated rows, and internal/external rotation. All exercises completed 2 sets of 10 reps. Demonstration, and Verbal and tactile cues provided for proper form and technique. Pt completed transfer EOM> w/Townsend with assist for set-up and education for proper transfer technique. Pt returned to room and left sitting in w/Townsend with surgeon present.  Pt educated regarding need for assist, fall risk, w/Townsend set-up, HEP, and d/Townsend planning.  Therapy Documentation Precautions:  Precautions Precautions: Fall Precaution Comments: L BKA Restrictions Weight Bearing Restrictions: Yes LLE Weight Bearing: Non weight bearing Pain: Pain Assessment Pain Assessment: No/denies pain  See FIM for current functional status  Therapy/Group: Individual Therapy  [name] 06/17/2015, 7:15 AM

## 2015-06-18 ENCOUNTER — Encounter (HOSPITAL_COMMUNITY): Payer: Self-pay | Admitting: Occupational Therapy

## 2015-06-18 ENCOUNTER — Ambulatory Visit (HOSPITAL_COMMUNITY): Payer: Self-pay

## 2015-06-18 DIAGNOSIS — B9561 Methicillin susceptible Staphylococcus aureus infection as the cause of diseases classified elsewhere: Secondary | ICD-10-CM

## 2015-06-18 DIAGNOSIS — E11628 Type 2 diabetes mellitus with other skin complications: Secondary | ICD-10-CM

## 2015-06-18 DIAGNOSIS — R7881 Bacteremia: Secondary | ICD-10-CM

## 2015-06-18 LAB — GLUCOSE, CAPILLARY
GLUCOSE-CAPILLARY: 302 mg/dL — AB (ref 65–99)
GLUCOSE-CAPILLARY: 160 mg/dL — AB (ref 65–99)

## 2015-06-18 MED ORDER — ACETAMINOPHEN 325 MG PO TABS: 650.0000 mg | ORAL_TABLET | Freq: Four times a day (QID) | ORAL | Status: DC | PRN

## 2015-06-18 MED ORDER — ADULT MULTIVITAMIN W/MINERALS CH: 1.0000 | ORAL_TABLET | Freq: Every day | ORAL | Status: AC

## 2015-06-18 MED ORDER — CEFAZOLIN SODIUM-DEXTROSE 2-3 GM-% IV SOLR: 2.0000 g | Freq: Three times a day (TID) | INTRAVENOUS | Status: DC

## 2015-06-18 MED ORDER — CALCIUM POLYCARBOPHIL 625 MG PO TABS: 625.0000 mg | ORAL_TABLET | Freq: Two times a day (BID) | ORAL | Status: DC

## 2015-06-18 MED ORDER — TRAMADOL HCL 50 MG PO TABS: 25.0000 mg | ORAL_TABLET | Freq: Four times a day (QID) | ORAL | Status: DC | PRN

## 2015-06-18 MED ORDER — INSULIN ASPART 100 UNIT/ML FLEXPEN: 10.0000 [IU] | PEN_INJECTOR | Freq: Three times a day (TID) | SUBCUTANEOUS | Status: DC

## 2015-06-18 MED ORDER — ADULT MULTIVITAMIN W/MINERALS CH: 1.0000 | ORAL_TABLET | Freq: Every day | ORAL | Status: DC

## 2015-06-18 MED ORDER — INSULIN GLARGINE 100 UNIT/ML SOLOSTAR PEN: 10.0000 [IU] | PEN_INJECTOR | Freq: Every day | SUBCUTANEOUS | Status: DC

## 2015-06-18 MED ORDER — INSULIN PEN NEEDLE 32G X 4 MM MISC: 1.0000 "application " | Freq: Three times a day (TID) | Status: DC

## 2015-06-18 MED ORDER — SIMETHICONE 80 MG PO CHEW: 80.0000 mg | CHEWABLE_TABLET | Freq: Four times a day (QID) | ORAL | Status: DC | PRN

## 2015-06-18 MED ORDER — POLYSACCHARIDE IRON COMPLEX 150 MG PO CAPS
150.0000 mg | ORAL_CAPSULE | Freq: Two times a day (BID) | ORAL | Status: DC
Start: 2015-06-18 — End: 2015-10-01

## 2015-06-18 NOTE — Progress Notes (Signed)
Social Work  Discharge Note  The overall goal for the admission was met for:   Discharge location: Yes - home with intermittent assistance of husband and other friends  Length of Stay: Yes - 6 days  Discharge activity level: Yes - mod ind @ w/c, min assist  Home/community participation: Yes  Services provided included: MD, RD, PT, OT, RN, TR, Pharmacy and SW and interpreter  Financial Services: only eligible for Emergency Medicaid  Follow-up services arranged: Home Health: RN via [facility], DME: 16x16 lightweight w/c with left amputee support pad, cushion, youth rolling walker, drop arm commode and tub bench via [facility] and Patient/Family has no preference for HH/DME agencies  Comments (or additional information):  Patient/Family verbalized understanding of follow-up arrangements: Yes  Individual responsible for coordination of the follow-up plan: pt/ spouse  Confirmed correct DME delivered: Andre Swander 06/18/2015    Odile Veloso

## 2015-06-18 NOTE — Progress Notes (Addendum)
[facility] PHYSICAL MEDICINE & REHABILITATION     PROGRESS NOTE    Subjective/Complaints: No new issues. Wound still with drainage. No increase in pain. No fever  ROS: Pt denies fever, rash/itching, headache, blurred or double vision, nausea, vomiting, abdominal pain, diarrhea, chest pain, shortness of breath, palpitations, dysuria, dizziness, neck or back pain, bleeding, anxiety, or depression   Objective: Vital Signs: Blood pressure 107/63, pulse 101, temperature 98.3 F (36.8 C), temperature source Oral, resp. rate 17, weight 52.073 kg (114 lb 12.8 oz), last menstrual period 05/12/2013, SpO2 94 %. No results found.  Recent Labs  06/17/15 0540  WBC 4.9  HGB 8.0*  HCT 25.0*  PLT 530*    Recent Labs  06/17/15 0540  NA 137  K 4.3  CL 102  GLUCOSE 351*  BUN 16  CREATININE 0.65  CALCIUM 8.1*   CBG (last 3)   Recent Labs  06/17/15 1707 06/17/15 2051 06/18/15 0640  GLUCAP 281* 326* 302*    Wt Readings from Last 3 Encounters:  06/12/15 52.073 kg (114 lb 12.8 oz)  06/06/15 52.5 kg (115 lb 11.9 oz)  03/21/15 60.328 kg (133 lb)    Physical Exam:  Nursing note and vitals reviewed. Constitutional: She is oriented to person, place, and time. She appears well-developed and well-nourished.   HENT: Head: Normocephalic and atraumatic.  Eyes: Conjunctivae are normal. Pupils are equal, round, and reactive to light.  Neck: Normal range of motion. Neck supple.  Cardiovascular: Normal rate and regular rhythm. no murmur Respiratory: Effort normal and breath sounds normal. No respiratory distress. She has no wheezes.  GI: Soft. Bowel sounds are normal. She exhibits no distension.  Musculoskeletal: She exhibits no tenderness.  L-BKA with some drainage medially---and mild blistering--incision appears largely viable---area of drainage is questionable Neurological: She is alert and oriented to person, place, and time.  No dysarthria or slurring noted. Able to follow  basic motor commands without difficulty. Moves BUE, RLE and Left residual limb without difficulty. UE 4/5. RLE 3/5 HF,KE and 4/5 adf/ LLE: HF 3/5. No sensory deficits.  Skin: Skin is warm and dry.  Psychiatric: She has a normal mood and affect. Her speech is normal and behavior is normal.   Assessment/Plan: 1. Functional deficits secondary to left BKA which require 3+ hours per day of interdisciplinary therapy in a comprehensive inpatient rehab setting. Physiatrist is providing close team supervision and 24 hour management of active medical problems listed below. Physiatrist and rehab team continue to assess barriers to discharge/monitor patient progress toward functional and medical goals. FIM: FIM - Bathing Bathing Steps Patient Completed: Chest, Abdomen, Right upper leg, Front perineal area, Right Arm, Left Arm, Buttocks, Right lower leg (including foot), Left upper leg Bathing: 5: Supervision: Safety issues/verbal cues  FIM - Upper Body Dressing/Undressing Upper body dressing/undressing steps patient completed: Thread/unthread right sleeve of pullover shirt/dresss, Thread/unthread left sleeve of pullover shirt/dress, Put head through opening of pull over shirt/dress, Pull shirt over trunk Upper body dressing/undressing: 6: More than reasonable amount of time FIM - Lower Body Dressing/Undressing Lower body dressing/undressing steps patient completed: Thread/unthread right pants leg, Thread/unthread left pants leg, Pull pants up/down, Don/Doff right sock Lower body dressing/undressing: 6: More than reasonable amount of time  FIM - Toileting Toileting steps completed by patient: Adjust clothing prior to toileting, Performs perineal hygiene, Adjust clothing after toileting Toileting Assistive Devices: Grab bar or rail for support Toileting: 6: More than reasonable amount of time  FIM - Radio producer Devices:  Grab bars Toilet Transfers: 6-More than reasonable  amt of time, 6-From toilet/BSC  FIM - Control and instrumentation engineer Devices: Arm rests Bed/Chair Transfer: 6: Assistive device: no helper  FIM - Locomotion: Wheelchair Locomotion: Wheelchair: 6: Travels 150 ft or more, turns around, maneuvers to table, bed or toilet, negotiates 3% grade: maneuvers on rugs and over door sills independently FIM - Locomotion: Ambulation Locomotion: Ambulation Assistive Devices: Walker - Rolling Locomotion: Ambulation: 1: Travels less than 50 ft with supervision/safety issues  Comprehension Comprehension Mode: Auditory Comprehension: 5-Understands basic 90% of the time/requires cueing < 10% of the time  Expression Expression Mode: Verbal Expression: 4-Expresses basic 75 - 89% of the time/requires cueing 10 - 24% of the time. Needs helper to occlude trach/needs to repeat words.  Social Interaction Social Interaction: 4-Interacts appropriately 75 - 89% of the time - Needs redirection for appropriate language or to initiate interaction.  Problem Solving Problem Solving: 4-Solves basic 75 - 89% of the time/requires cueing 10 - 24% of the time  Memory Memory: 6-More than reasonable amt of time  Medical Problem List and Plan: 1. Functional deficits secondary to L-BKA'  -dc home today  -follow up arranged 2. DVT Prophylaxis/Anticoagulation: Pharmaceutical: Lovenox--dc 3. Pain Management: Oxycodone prn seems to be holding her 4. Mood: Team to provide ego support. LCSW to follow for evaluation and support.  5. Neuropsych: This patient is capable of making decisions on her own behalf. 6. Skin/Wound Care: dry dressing daily with ACE to left leg. May need to change BID if drainage increases   -pt has follow up with ortho next week  -abx per below 7. Fluids/Electrolytes/Nutrition: Monitor I/O. supplemented K+  8. Acute on Chronic iron deficiency anemia: Baseline Hgb- 9.9. Asymptomatic  -hgb holding at 8 9. DM type 2: Will monitor BS  ac/hs. Continue lantus and titrated to home dose as indicated. Will use SSI for elevated BS.  10. MSSA bacteremia with SIRS reaction: Continue IV ancef for 3 additional weeks (through 8/2)  -afebrile 11. Protein malnutrition:   protein supplement.  12. Diarrhea-resolved     LOS (Days) 6 A FACE TO FACE EVALUATION WAS PERFORMED  Brenleigh Collet T 06/18/2015 7:47 AM

## 2015-06-18 NOTE — Progress Notes (Signed)
Occupational Therapy Discharge Summary  Patient Details  Name: [name] MRN: [id number] Date of Birth: [birth year]   Patient has met 8 of 8 long term goals due to improved activity tolerance, improved balance, postural control and ability to compensate for deficits.  Patient to discharge at overall supervision- Modified Independent level.  Patient's care partner is independent to provide the necessary physical assistance at discharge.     Recommendation:  Patient will benefit from ongoing skilled OT services in home health setting to continue to advance functional skills in the area of BADL and iADL.  Pt and caregiver provided with extensive family education including recommendation to complete shower transfers with supervision, use of tub shower bench, 3-1 drop arm BSC with recommendation to use BSC when supervision is not available to assist with ambulating into bathroom, fall risk, and energy conservation.  Pt refused all bathing/dressing tasks during rehab stay, stating husband would assist at d/c and practice was not necessary. Despite education and encouragement, pt cont to refused. Simulated transfers and bathing/ dressing completed, with pt and caregiver voicing understanding and demonstrating ability to complete transfers safely.   Equipment: Drop arm BSC and tub shower bench  Reasons for discharge: treatment goals met and discharge from hospital  Patient/family agrees with progress made and goals achieved: Yes  OT Discharge Precautions/Restrictions  Precautions Precautions: Fall Precaution Comments: L BKA Restrictions Weight Bearing Restrictions: Yes LLE Weight Bearing: Non weight bearing Vision/Perception  Vision- History Baseline Vision/History: Wears glasses Patient Visual Report: No change from baseline  Cognition Overall Cognitive Status: Within Functional Limits for tasks assessed Arousal/Alertness: Awake/alert Orientation Level: Oriented X4 Memory:  Appears intact Awareness: Appears intact Problem Solving: Appears intact Safety/Judgment: Appears intact Comments: At times pt is somewhat impulsive and prefers to perform mobility in her way despite education and cues for improved technique.  Sensation Sensation Light Touch: Appears Intact Coordination Gross Motor Movements are Fluid and Coordinated: Yes Fine Motor Movements are Fluid and Coordinated: Yes Motor  Motor Motor: Within Functional Limits Mobility  Transfers Transfers: Sit to Stand;Stand to Sit Sit to Stand: 6: Modified independent (Device/Increase time) Stand to Sit: 6: Modified independent (Device/Increase time)  Trunk/Postural Assessment  Cervical Assessment Cervical Assessment: Within Functional Limits Thoracic Assessment Thoracic Assessment: Within Functional Limits Lumbar Assessment Lumbar Assessment: Within Functional Limits Postural Control Postural Control: Within Functional Limits  Balance Balance Balance Assessed: Yes Static Sitting Balance Static Sitting - Level of Assistance: 6: Modified independent (Device/Increase time) Dynamic Sitting Balance Dynamic Sitting - Level of Assistance: 6: Modified independent (Device/Increase time) Static Standing Balance Static Standing - Level of Assistance: 6: Modified independent (Device/Increase time) Dynamic Standing Balance Dynamic Standing - Level of Assistance: 5: Stand by assistance Extremity/Trunk Assessment RUE Assessment RUE Assessment: Within Functional Limits LUE Assessment LUE Assessment: Within Functional Limits  See FIM for current functional status  Lewis, Chantell Kunkler C 06/18/2015, 8:56 AM

## 2015-06-18 NOTE — Plan of Care (Signed)
Problem: RH Balance Goal: LTG Patient will maintain dynamic standing balance (PT) LTG: Patient will maintain dynamic standing balance with assistance during mobility activities (PT)  Outcome: Completed/Met Date Met:  06/18/15 Recommend S for higher level dynamic standing balance but able to perform sit to stand/standing at mod I for ADLs

## 2015-06-18 NOTE — Progress Notes (Signed)
Occupational Therapy Session Note  Patient Details  Name: [name] MRN: [id number] Date of Birth: January 28, 1966  Today's Date: 06/18/2015 OT Individual Time: 0900-1000 OT Individual Time Calculation (min): 60 min    Short Term Goals: Week 1:  OT Short Term Goal 1 (Week 1): STG=LTG due to estimated LOS  Skilled Therapeutic Interventions/Progress Updates:    Pt seen for OT session focusing on caregiver training. Pt sitting up in w/c upon arrival with husband present, voicing desire to skip therapy and go home, however, willing to complete family training with education provided regarding pt's need for assist with functional tasks upon d/c- and agreeable to tx with encouragement. Pt and caregiver educated regarding use of 3-1 BSC, with recommendation to use it in area she can access from w/c when at home alone and able to place over toilet for use of arm rests when completing toileting task over standard toilet when caregiver present as pt unable to use w/c in bathroom and recommendation for supervision when ambulating with RW. Education and demonstration provided for use of drop arm BSC function and adjusting height of BSC. Pt completed transfer on/off BSC via squat pivot from w/c. Pt unable to perform/ recall recommendation for set-up of equipment prior to transfer which was taught in yesterday's session. Education and demonstration provided on technique and set-up of equipment with pt return demonstrating. Pt then self propelled w/c to ADL apartment and completed simulated tub shower transfer. Pt ambulated into/out of bathroom, having 1 LOB episode requiring min A to regain balance. Education provided regarding set-up of tub transfer bench, recommendation for supervision when completing shower transfers, and recommendation to complete showering task seated and using lateral leans to complete buttock hygiene- pt and caregiver voiced understanding. Education provided regarding safety in the  kitchen including accessing items from w/c level and recommendation to place commonly used items on counter top level for easy access. Pt then completed functional ambulation in hallway using RW with husband providing close supervision. Pt fatigued following ~30 ft and education provided regarding energy conservation and importance of taking seated rest breaks before becoming overly fatigued in order to decrease fall risk. UE strengthening exercises using theraband reviewed, and therapist voiced importance of completing UE strengthening exercises following d/c for functional purposes and importance of participation and safety with ADL/IADLs tasks. Pt returned to room at end of session, left sitting in w/c with husband, interpreter, and PA present.   Therapy Documentation Precautions:  Precautions Precautions: Fall Precaution Comments: L BKA Restrictions Weight Bearing Restrictions: Yes LLE Weight Bearing: Non weight bearing Pain: Pain Assessment Pain Assessment: No/denies pain  See FIM for current functional status  Therapy/Group: Individual Therapy  Lewis, Delorean Knutzen C 06/18/2015, 7:10 AM
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
## 2015-06-18 NOTE — Discharge Instructions (Signed)
Inpatient Rehab Discharge Instructions  [name] Discharge date and time: No discharge date for patient encounter.   Activities/Precautions/ Functional Status: Activity:  Use wheelchair when you are home alone. Do not walk at home unless your husband is present.  Diet: diabetic diet Wound Care: Wash with soap and water. Pat dry. Cover with gauze, Kerlix and then ace wrap.   Functional status:  ___ No restrictions     ___ Walk up steps independently ___ 24/7 supervision/assistance   ___ Walk up steps with assistance _X__ Intermittent supervision/assistance  ___ Bathe/dress independently ___ Walk with walker     _X__ Bathe/dress with assistance ___ Walk Independently    ___ Shower independently ___ Walk with assistance    ___ Shower with assistance _X__ No alcohol     ___ Return to work/school ________    COMMUNITY REFERRALS UPON DISCHARGE:    Home Health:     RN                     Agency:  [facility] Phone: [phone]   Medical Equipment/Items Ordered:  Wheelchair, cushion, walker, commode and tub bench                                                      Agency/Supplier: [facility] @ [phone]  GENERAL COMMUNITY RESOURCES FOR PATIENT/FAMILY:  Support Groups:  Amputee Group         Special Instructions: 1. Keep left leg elevated as much as possible to help decrease swelling.  2. Take your medications as prescribed.  3. Go by [facility] to pick up your medications.    My questions have been answered and I understand these instructions. I will adhere to these goals and the provided educational materials after my discharge from the hospital.  Patient/Caregiver Signature _______________________________ Date __________  Clinician Signature _______________________________________ Date __________  Please bring this form and your medication list with you to all your follow-up doctor's appointments.

## 2015-06-18 NOTE — Progress Notes (Signed)
Pt discharged at 1248 with husband, discharge instructions explained by Algis Liming, PA. Interpreter present at the time of instructions. No further questions at this time. NT escorted patient to the front. Shanece Cochrane, Dione Plover

## 2015-06-20 NOTE — Discharge Summary (Signed)
Physician Discharge Summary  Patient ID: [name] MRN: [id number] DOB/AGE: 07/14/1966 49 y.o.  Admit date: 06/12/2015 Discharge date: 06/18/2015  Discharge Diagnoses:  Principal Problem:   Status post below knee amputation of left lower extremity Active Problems:   DM type 2 (diabetes mellitus, type 2)   Diabetes mellitus type 2, controlled   Staphylococcus aureus bacteremia   Discharged Condition: Stable     Labs:  Basic Metabolic Panel:  Recent Labs Lab 06/17/15 0540  NA 137  K 4.3  CL 102  CO2 29  GLUCOSE 351*  BUN 16  CREATININE 0.65  CALCIUM 8.1*    CBC:  Recent Labs Lab 06/17/15 0540  WBC 4.9  NEUTROABS 2.2  HGB 8.0*  HCT 25.0*  MCV 89.0  PLT 530*    CBG:  Recent Labs Lab 06/17/15 1212 06/17/15 1707 06/17/15 2051 06/18/15 0640 06/18/15 1144  GLUCAP 100* 281* 326* 302* 160*    Brief HPI:   Dhruti Ghuman is a 49 y.o. female with h/o DM, vaginal cancer, chronic LLE infection who was admitted on 06/02/15 with gangrenous changes with necrotizing fascitis left foot and septic shock. She was intubated for acute respiratory failure and was started on IV antibiotics for treatment. She underwent left [place] amputation the same day for severe infection followed by BKA on 07/04. She required multiple I and D on 07/07 and 07/09 with closure of wound andplacement of antibiotic beads. Blood cultures were positive for Staph aureus and strep Viridans. TEE negative for vegetations an d Dr. Megan Salon recommended at least three additional weeks of IV antibiotics (therapy to be determined by wound healing). Therapy ongoing and CIR recommended by MD and Rehab team for follow up therapy   Hospital Course: Jaimey Franchini was admitted to rehab 06/12/2015 for inpatient therapies to consist of PT, ST and OT at least three hours five days a week. Past admission physiatrist, therapy team and rehab RN have worked together to provide customized  collaborative inpatient rehab.  Follow up labs show white count is stable and ABLA continues. Patient is asymptomatic and she is to continue on iron supplement past discharge. Check of lytes revealed mild hypokalemia which has resolved with supplementation. Po intake has improved and she is continent of bowel and bladder.  Diabetes has been monitored on ac/hs basis and BS are noted to be trending upwards. Lantus was increased to 10 units at bedtime with novolog 10 units with meals. Patient was advise to continue monitoring BS 3-4 times a day and follow up with MD for further titration of insulin.  L-BKA wound has been monitored daily and has had increase in edema with some blistering as well as serosanguinous drainage. Stump was evaluated by Dr. Erlinda Hong who recommended continuing antibiotic for two additional weeks and she is to follow up in the office in a week for recheck of wound.  They were also instructed that she should ambulate with supervision. She has made good progress during her stay and is independent at wheelchair level.  Patient and husband have been educated on management of wound as well as importance of medication compliance as well post hospital/post op follow up.  [facility] to provide [facility] for wound monitoring past discharge.    Rehab course: During patient's stay in rehab weekly team conferences were held to monitor patient's progress, set goals and discuss barriers to discharge. At admission, patient required min assist with ADL tasks and mobility. She has had improvement in activity tolerance, balance, postural control, as  well as ability to compensate for deficits. She refused to engage in any bathing or dressing tasks during her stay and reported that her husband would assist as needed past discharge. She was able to simulate transfer and bathing and dressing at modified independent to supervision level.  She is able to propel her wheelchair and able to perform transfers independently.  She requires close to steady to ambulate 50 feet with RW. Family education was done with husband on HEP as well as safety with mobility.       Disposition: 01-Home or Self Care  Diet: Diabetic.   Special Instructions: 1. Cleanse wound with soap and water. Pat dry and apply dry, compressive dressing. 2. Ambulate with supervision.     Medication List    STOP taking these medications        atorvastatin 20 MG tablet  Commonly known as:  LIPITOR     insulin aspart 100 UNIT/ML injection  Commonly known as:  novoLOG  Replaced by:  insulin aspart 100 UNIT/ML FlexPen     insulin glargine 100 UNIT/ML injection  Commonly known as:  LANTUS  Replaced by:  Insulin Glargine 100 UNIT/ML Solostar Pen     oxyCODONE-acetaminophen 5-325 MG per tablet  Commonly known as:  PERCOCET     Vitamin D (Ergocalciferol) 50000 UNITS Caps capsule  Commonly known as:  DRISDOL      TAKE these medications        acetaminophen 325 MG tablet  Commonly known as:  TYLENOL  Take 2 tablets (650 mg total) by mouth every 6 (six) hours as needed for mild pain or moderate pain.     ceFAZolin 2-3 GM-% Solr  Commonly known as:  ANCEF  Inject 50 mLs (2 g total) into the vein every 8 (eight) hours.     glucose monitoring kit monitoring kit  1 each by Does not apply route 4 (four) times daily - after meals and at bedtime. 1 month Diabetic Testing Supplies for QAC-QHS accuchecks.     RELION CONFIRM GLUCOSE MONITOR W/DEVICE Kit  1 kit by Does not apply route 3 (three) times daily.     insulin aspart 100 UNIT/ML FlexPen  Commonly known as:  NOVOLOG FLEXPEN  Inject 10 Units into the skin 3 (three) times daily with meals.     Insulin Glargine 100 UNIT/ML Solostar Pen  Commonly known as:  LANTUS SOLOSTAR  Inject 10 Units into the skin at bedtime.     Insulin Pen Needle 32G X 4 MM Misc  1 application by Does not apply route 4 (four) times daily -  with meals and at bedtime.     iron polysaccharides 150 MG  capsule  Commonly known as:  NIFEREX  Take 1 capsule (150 mg total) by mouth 2 times daily at 12 noon and 4 pm.     multivitamin with minerals Tabs tablet  Take 1 tablet by mouth daily.     polycarbophil 625 MG tablet  Commonly known as:  FIBERCON  Take 1 tablet (625 mg total) by mouth 2 (two) times daily.     simethicone 80 MG chewable tablet  Commonly known as:  MYLICON  Chew 1 tablet (80 mg total) by mouth 4 (four) times daily as needed for flatulence (for gas and bloating).     traMADol 50 MG tablet  Commonly known as:  ULTRAM  Take 0.5-1 tablets (25-50 mg total) by mouth every 6 (six) hours as needed for severe pain.  Follow-up Information    Follow up with Meredith Staggers, MD On 08/02/2015.   Specialty:  Physical Medicine and Rehabilitation   Why:  Be there at 11:20   for 11:40 am appointment   Contact information:   [address], [place] [address] [phone]       Follow up with Michel Bickers, MD On 07/01/2015.   Specialty:  Infectious Diseases   Why:  BE there at 3:15  for  3:30 am appointment    Contact information:   [address] [place] [address] [phone]       Follow up with Marianna Payment, MD On 06/25/2015.   Specialty:  Orthopedic Surgery   Why:  Be there at  10:45 am  appointment    Contact information:   [place] [address] [phone]       Follow up with Lorayne Marek, MD On 06/25/2015.   Specialty:  Internal Medicine   Why:  @ 2:00 pm   Contact information:   [place] [address] [phone]       Signed: Bary Leriche 06/20/2015, 4:15 PM

## 2015-06-22 ENCOUNTER — Inpatient Hospital Stay (HOSPITAL_COMMUNITY)
Admission: EM | Admit: 2015-06-22 | Discharge: 2015-06-24 | DRG: 316 | Disposition: A | Discharge status: Home Health | Payer: Medicaid Other | Attending: Internal Medicine | Admitting: Internal Medicine

## 2015-06-22 ENCOUNTER — Encounter (HOSPITAL_COMMUNITY): Payer: Self-pay | Admitting: Emergency Medicine

## 2015-06-22 DIAGNOSIS — D638 Anemia in other chronic diseases classified elsewhere: Secondary | ICD-10-CM | POA: Diagnosis present

## 2015-06-22 DIAGNOSIS — T80219A Unspecified infection due to central venous catheter, initial encounter: Principal | ICD-10-CM | POA: Diagnosis present

## 2015-06-22 DIAGNOSIS — E119 Type 2 diabetes mellitus without complications: Secondary | ICD-10-CM

## 2015-06-22 DIAGNOSIS — Y848 Other medical procedures as the cause of abnormal reaction of the patient, or of later complication, without mention of misadventure at the time of the procedure: Secondary | ICD-10-CM | POA: Diagnosis present

## 2015-06-22 DIAGNOSIS — L03113 Cellulitis of right upper limb: Secondary | ICD-10-CM

## 2015-06-22 DIAGNOSIS — Z8544 Personal history of malignant neoplasm of other female genital organs: Secondary | ICD-10-CM | POA: Diagnosis not present

## 2015-06-22 DIAGNOSIS — E876 Hypokalemia: Secondary | ICD-10-CM | POA: Diagnosis present

## 2015-06-22 DIAGNOSIS — Z89512 Acquired absence of left leg below knee: Secondary | ICD-10-CM | POA: Diagnosis not present

## 2015-06-22 DIAGNOSIS — E1165 Type 2 diabetes mellitus with hyperglycemia: Secondary | ICD-10-CM

## 2015-06-22 DIAGNOSIS — M868X7 Other osteomyelitis, ankle and foot: Secondary | ICD-10-CM

## 2015-06-22 DIAGNOSIS — B9561 Methicillin susceptible Staphylococcus aureus infection as the cause of diseases classified elsewhere: Secondary | ICD-10-CM | POA: Diagnosis present

## 2015-06-22 DIAGNOSIS — Z794 Long term (current) use of insulin: Secondary | ICD-10-CM

## 2015-06-22 DIAGNOSIS — Z79899 Other long term (current) drug therapy: Secondary | ICD-10-CM | POA: Diagnosis not present

## 2015-06-22 DIAGNOSIS — R7881 Bacteremia: Secondary | ICD-10-CM | POA: Diagnosis present

## 2015-06-22 LAB — BASIC METABOLIC PANEL
ANION GAP: 7 (ref 5–15)
BUN: 6 mg/dL (ref 6–20)
CHLORIDE: 102 mmol/L (ref 101–111)
CO2: 27 mmol/L (ref 22–32)
Calcium: 7.9 mg/dL — ABNORMAL LOW (ref 8.9–10.3)
Creatinine, Ser: 0.62 mg/dL (ref 0.44–1.00)
GFR calc non Af Amer: >60 mL/min (ref >60)
Glucose, Bld: 315 mg/dL — ABNORMAL HIGH (ref 65–99)
POTASSIUM: 3.8 mmol/L (ref 3.5–5.1)
Sodium: 136 mmol/L (ref 135–145)

## 2015-06-22 LAB — CBC WITH DIFFERENTIAL/PLATELET
BASOS ABS: 0 10*3/uL (ref 0.0–0.1)
BASOS PCT: 0 % (ref 0–1)
EOS ABS: 0.4 10*3/uL (ref 0.0–0.7)
EOS PCT: 4 % (ref 0–5)
HEMATOCRIT: 27.2 % — AB (ref 36.0–46.0)
Hemoglobin: 8.7 g/dL — ABNORMAL LOW (ref 12.0–15.0)
LYMPHS ABS: 2.1 10*3/uL (ref 0.7–4.0)
Lymphocytes Relative: 23 % (ref 12–46)
MCH: 29.1 pg (ref 26.0–34.0)
MCHC: 32 g/dL (ref 30.0–36.0)
MCV: 91 fL (ref 78.0–100.0)
Monocytes Absolute: 0.5 10*3/uL (ref 0.1–1.0)
Monocytes Relative: 6 % (ref 3–12)
Neutro Abs: 6.2 10*3/uL (ref 1.7–7.7)
Neutrophils Relative %: 67 % (ref 43–77)
Platelets: 387 10*3/uL (ref 150–400)
RBC: 2.99 MIL/uL — AB (ref 3.87–5.11)
RDW: 15.3 % (ref 11.5–15.5)
WBC: 9.3 10*3/uL (ref 4.0–10.5)

## 2015-06-22 LAB — GLUCOSE, CAPILLARY: GLUCOSE-CAPILLARY: 238 mg/dL — AB (ref 65–99)

## 2015-06-22 MED ORDER — CEFAZOLIN SODIUM-DEXTROSE 2-3 GM-% IV SOLR
2.0000 g | Freq: Once | INTRAVENOUS | Status: AC
  Administered 2015-06-22: 2 g via INTRAVENOUS
  Filled 2015-06-22: qty 50

## 2015-06-22 MED ORDER — TRAMADOL HCL 50 MG PO TABS
25.0000 mg | ORAL_TABLET | Freq: Four times a day (QID) | ORAL | Status: DC | PRN
  Administered 2015-06-24: 50 mg via ORAL
  Filled 2015-06-22: qty 1

## 2015-06-22 MED ORDER — HYDROCODONE-ACETAMINOPHEN 5-325 MG PO TABS: 1.0000 | ORAL_TABLET | ORAL | Status: DC | PRN

## 2015-06-22 MED ORDER — MIDAZOLAM HCL 2 MG/2ML IJ SOLN: 2.0000 mg | Freq: Once | INTRAMUSCULAR | Status: DC

## 2015-06-22 MED ORDER — INSULIN GLARGINE 100 UNIT/ML ~~LOC~~ SOLN
10.0000 [IU] | Freq: Every day | SUBCUTANEOUS | Status: DC
  Administered 2015-06-22: 10 [IU] via SUBCUTANEOUS
  Filled 2015-06-22 (×2): qty 0.1

## 2015-06-22 MED ORDER — SIMETHICONE 80 MG PO CHEW: 80.0000 mg | CHEWABLE_TABLET | Freq: Four times a day (QID) | ORAL | Status: DC | PRN

## 2015-06-22 MED ORDER — ENOXAPARIN SODIUM 40 MG/0.4ML ~~LOC~~ SOLN
40.0000 mg | SUBCUTANEOUS | Status: DC
Start: 2015-06-22 — End: 2015-06-24
  Administered 2015-06-22 – 2015-06-23 (×2): 40 mg via SUBCUTANEOUS
  Filled 2015-06-22 (×4): qty 0.4

## 2015-06-22 MED ORDER — SENNOSIDES-DOCUSATE SODIUM 8.6-50 MG PO TABS: 1.0000 | ORAL_TABLET | Freq: Every evening | ORAL | Status: DC | PRN

## 2015-06-22 MED ORDER — INSULIN ASPART 100 UNIT/ML ~~LOC~~ SOLN
10.0000 [IU] | Freq: Three times a day (TID) | SUBCUTANEOUS | Status: DC
  Administered 2015-06-23: 10 [IU] via SUBCUTANEOUS

## 2015-06-22 MED ORDER — CEFAZOLIN SODIUM-DEXTROSE 2-3 GM-% IV SOLR
2.0000 g | Freq: Three times a day (TID) | INTRAVENOUS | Status: DC
  Administered 2015-06-23 – 2015-06-24 (×5): 2 g via INTRAVENOUS
  Filled 2015-06-22 (×7): qty 50

## 2015-06-22 MED ORDER — ADULT MULTIVITAMIN W/MINERALS CH
1.0000 | ORAL_TABLET | Freq: Every day | ORAL | Status: DC
  Administered 2015-06-23 – 2015-06-24 (×2): 1 via ORAL
  Filled 2015-06-22 (×3): qty 1

## 2015-06-22 MED ORDER — INSULIN ASPART 100 UNIT/ML ~~LOC~~ SOLN
0.0000 [IU] | Freq: Three times a day (TID) | SUBCUTANEOUS | Status: DC
  Administered 2015-06-23 – 2015-06-24 (×2): 3 [IU] via SUBCUTANEOUS

## 2015-06-22 NOTE — ED Notes (Signed)
Via translation phone: pt requesting to leave states husband has to go to work and cannot wait for IV nurse. Explained to pt risks if she left AMA, IV nurse arriving at this time. IV nurse at bedside.

## 2015-06-22 NOTE — ED Notes (Signed)
Per Translator: pt from home for replacement PICC line, pt states PICC line was leaking and states home health RN took the PICC line out and was told to come to ED for another one. Pt denies any irritation, no complaints. States "I felt it leaking when they were giving me antibiotics but never saw it actually leaking." Pt last received antibiotics this morning. nad noted.

## 2015-06-22 NOTE — Progress Notes (Signed)
Report received from Andee Poles, [place] from ED. Pt is to be admitted on 5W33. Awaiting pt's arrival.

## 2015-06-22 NOTE — H&P (Signed)
[facility] History and Physical  Gresia Isidoro TMA:[id number] DOB: [birth year] DOA: 06/22/2015  Referring physician: ED physician PCP: Lorayne Marek, MD   Chief Complaint: PICC line taken out  HPI:  Ms. [name] is a 49yo [place] speaking woman with PMH of DM2, anemia, vulvar cancer, osteomyelitis s/p BKA on the left and recent MSSA bacteremia who presents for concern of line infection.  Apparently, today, Ms. Hernandez's home health nurse pulled her PICC line b/c she ws concerned that the site was infected and was draining some pus.  She was brought to the ED for replacement of PICC line.  The IV team evaluated the line here and felt that the site was still infected given redness and a subcutaneous nodule at the site.  They preferred to leave the line out for at least 24-48 hours and place a PIV.  This was done and the patient received a dose of her Ancef as planned.    Other than the line being pulled, Ms. [name] has no complaints. She is healing well from her BKA and does not have any pain or issues.  She has no fever chills, nausea, vomiting. She was not completely understanding of why the line was taken out and thought it was just time to change the line.  We discussed the reasons for admission.    Ms. [name] was admitted from 7/2 - 7/13 for severe sepsis due to MSSA.    She had gangrene of the foot and surgery was done as noted above.  She was discharged with PICC line to complete 3 more weeks of Abx.  By my math, end date would be 07/02/15.  She had a TEE during that admission and had no endocarditis.    Assessment and Plan:  Staphylococcus aureus bacteremia - She is on cefazolin every 8 hours, this has been continued in house - She has PIV access for further Abx - Consult IV team in the AM for possible replacement of PICC - Patient is very interested in getting home as soon as possible  DM type 2 (diabetes mellitus, type 2) - Last A1C 11.7 on 7/2 - Lantus at half  home dose - SSI - Meal coverage at home dose - Increase lantus as needed    Anemia of chronic disease - H/H stable at baseline today, normocytic - She is on iron supplementation at home which should be continued at discharge    Status post below knee amputation of left lower extremity - Consider wound care vs. Surgery consult for changing bandage  H/O vulvar cancer - Follow up with Oncology as an outpatient.   DVT PPx: Lovenox  Diet: Carb modified   Radiological Exams on Admission: No results found.   Code Status: Full Family Communication: Pt at bedside, translator phone used Disposition Plan: Admit for further evaluation    Gilles Chiquito, MD [phone]   Review of Systems:  Constitutional: Negative for fever, chills and malaise/fatigue. Negative for diaphoresis.  HENT: Negative for hearing loss, ear pain Eyes: Negative for blurred vision, double vision  Respiratory: Negative for cough, hemoptysis, sputum production  Cardiovascular: Negative for chest pain, palpitations, orthopnea Gastrointestinal: Negative for nausea, vomiting and abdominal pain Genitourinary: Negative for dysuria, urgency, frequency, hematuria and flank pain.  Musculoskeletal: Negative for myalgias, back pain Skin: Negative for itching and rash.  Neurological: Negative for dizziness and weakness     Past Medical History  Diagnosis Date  . Diabetes mellitus   . 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 Janie Morning), Cysto performed by Dr. Jonna Munro 12/2009 with evac of clots and bx and fulguration. // H/O stage 2 SCC of the vulva  . Anemia     2/2 blood loss  . Diabetes mellitus type 2, uncontrolled DX: 2001  . 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 Diabetic foot ulcers   .  Cataract   . Diabetes mellitus without complication     Past Surgical History  Procedure Laterality Date  . Radical wide local excision of the vulva and right nguinal lymph node dissection  10/2003    Dr. Fermin Schwab  . Uterine dilatation and currettage    . Labial mass excision  07/2003    Dr. Ree Edman  . Left second toe mtp joint amputation  12/19/2010    Dr. Mayer Camel  . Irrigation and debridement of left foot with removal of left  01/29/2011    Dr. Mayer Camel  . Tubal ligation    . I&d extremity Left 06/02/2015    Procedure: IRRIGATION AND DEBRIDEMENT EXTREMITY;  Surgeon: Leandrew Koyanagi, MD;  Location: [facility];  Service: Orthopedics;  Laterality: Left;  . Amputation Left 06/02/2015    Procedure: Partial Left  AMPUTATION FOOT;  Surgeon: Leandrew Koyanagi, MD;  Location: [facility];  Service: Orthopedics;  Laterality: Left;  . I&d extremity Left 06/05/2015    Procedure: IRRIGATION AND DEBRIDEMENT LEFT LEG, POSSIBLE CLOSURE BELOW KNEE AMPUTATION;  Surgeon: Leandrew Koyanagi, MD;  Location: [place];  Service: Orthopedics;  Laterality: Left;  . Amputation Left 06/03/2015    Procedure: Left Transtibial amputation,application of wound vac;  Surgeon: Leandrew Koyanagi, MD;  Location: [facility];  Service: Orthopedics;  Laterality: Left;  . I&d extremity Left 06/08/2015    Procedure: IRRIGATION AND DEBRIDEMENT LEFT LEG, WITH  CLOSURE OF BKA;  Surgeon: Leandrew Koyanagi, MD;  Location: [place];  Service: Orthopedics;  Laterality: Left;  . Tee without cardioversion N/A 06/11/2015    Procedure: TRANSESOPHAGEAL ECHOCARDIOGRAM (TEE);  Surgeon: Lelon Perla, MD;  Location: [facility] ENDOSCOPY;  Service: Cardiovascular;  Laterality: N/A;    Social History:  reports that she has never smoked. She has never used smokeless tobacco. She reports that she does not drink alcohol or use illicit drugs.  No Known Allergies  Family History  Problem Relation Age of Onset  . Diabetes Mother   . Diabetes Father   . Diabetes Brother     Prior to Admission  medications   Medication Sig Start Date End Date Taking? Authorizing Provider  ceFAZolin (ANCEF) 2-3 GM-% SOLR Inject 50 mLs (2 g total) into the vein every 8 (eight) hours. 06/18/15  Yes Ivan Anchors Love, PA-C  insulin aspart (NOVOLOG FLEXPEN) 100 UNIT/ML FlexPen Inject 10 Units into the skin 3 (three) times daily with meals. 06/18/15  Yes Ivan Anchors Love, PA-C  Insulin Glargine (LANTUS SOLOSTAR) 100 UNIT/ML Solostar Pen Inject 10 Units into the skin at bedtime. Patient taking differently: Inject 16-17 Units into the skin at bedtime.  06/18/15  Yes Ivan Anchors Love, PA-C  iron polysaccharides (NIFEREX) 150 MG capsule Take 1 capsule (150 mg total) by mouth 2 times daily at 12 noon and 4 pm. 06/18/15  Yes Ivan Anchors Love, PA-C  Multiple Vitamin (MULTIVITAMIN WITH MINERALS) TABS tablet Take  1 tablet by mouth daily. 06/18/15  Yes Ivan Anchors Love, PA-C  polycarbophil (FIBERCON) 625 MG tablet Take 1 tablet (625 mg total) by mouth 2 (two) times daily. 06/18/15  Yes Ivan Anchors Love, PA-C  simethicone (MYLICON) 80 MG chewable tablet Chew 1 tablet (80 mg total) by mouth 4 (four) times daily as needed for flatulence (for gas and bloating). 06/18/15  Yes Ivan Anchors Love, PA-C  traMADol (ULTRAM) 50 MG tablet Take 0.5-1 tablets (25-50 mg total) by mouth every 6 (six) hours as needed for severe pain. 06/18/15  Yes Ivan Anchors Love, PA-C  acetaminophen (TYLENOL) 325 MG tablet Take 2 tablets (650 mg total) by mouth every 6 (six) hours as needed for mild pain or moderate pain. Patient not taking: Reported on 06/22/2015 06/18/15   Ivan Anchors Love, PA-C  Blood Glucose Monitoring Suppl (RELION CONFIRM GLUCOSE MONITOR) W/DEVICE KIT 1 kit by Does not apply route 3 (three) times daily. 03/22/15   Costin Karlyne Greenspan, MD  glucose monitoring kit (FREESTYLE) monitoring kit 1 each by Does not apply route 4 (four) times daily - after meals and at bedtime. 1 month Diabetic Testing Supplies for QAC-QHS accuchecks. Patient not taking: Reported on 03/20/2015 12/20/14    Lorayne Marek, MD  Insulin Pen Needle 32G X 4 MM MISC 1 application by Does not apply route 4 (four) times daily -  with meals and at bedtime. 06/18/15   Bary Leriche, PA-C    Physical Exam: Filed Vitals:   06/22/15 1515 06/22/15 1945 06/22/15 2000 06/22/15 2026  BP: 118/53   108/56  Pulse: 80 82 86 86  Temp:    98.2 F (36.8 C)  TempSrc:    Oral  Resp:    18  SpO2: 100% 100% 98% 99%    Physical Exam  Constitutional: Appears well-developed and well-nourished. No distress.  HENT: NCAT  Eyes: Conjunctivae  normal. No scleral icterus.  Neck: Normal ROM. Neck supple.  CVS: RRR, S1/S2 +, no murmurs Pulmonary: Effort and breath sounds normal,No rhonchi, wheezes, rales.  Abdominal: Soft. BS +,  no distension, tenderness, rebound or guarding.  Musculoskeletal: No edema and no tenderness.  She has bandage on left stump with small amount of reddish drainage.  Neuro: Alert.No cranial nerve deficit. Skin: Skin is warm and dry. I did not appreciate any erythema at site of PICC line, there is a very small nodule at the site.  No excessive warmth.  Psychiatric: Normal mood and affect. Behavior, judgment, thought content normal.   Labs on Admission:  Basic Metabolic Panel:  Recent Labs Lab 06/17/15 0540 06/22/15 1701  NA 137 136  K 4.3 3.8  CL 102 102  CO2 29 27  GLUCOSE 351* 315*  BUN 16 6  CREATININE 0.65 0.62  CALCIUM 8.1* 7.9*   CBC:  Recent Labs Lab 06/17/15 0540 06/22/15 1701  WBC 4.9 9.3  NEUTROABS 2.2 6.2  HGB 8.0* 8.7*  HCT 25.0* 27.2*  MCV 89.0 91.0  PLT 530* 387   CBG:  Recent Labs Lab 06/17/15 1212 06/17/15 1707 06/17/15 2051 06/18/15 0640 06/18/15 1144  GLUCAP 100* 281* 326* 302* 160*   If 7PM-7AM, please contact night-coverage [email or web address] Password [email or web address] 06/22/2015, 9:06 PM

## 2015-06-22 NOTE — Progress Notes (Signed)
PICC removed at home per pt by female home health nurse due to leaking.  Pt speaks broken [place].  Assessed site where PICC was removed.  Noticeable dime sized redness noted around site.  Actual site with yellow moist scab noted.  Also approximately 1.5 inch diameter lump surrounding insertion site.  ED MD notified.  Recommend waiting on PICC placement due to suspected infection.

## 2015-06-22 NOTE — ED Notes (Cosign Needed)
Pt has an MSSA bacteremia and currently receiving Ancef TID via a PICC line.  However, PICC line was pulled by home health nurse today.  We have request IV nurse to place another PICC line but the nurse was concern that the PICC site is infected due to the presence of mild redness and a subcutaneous nodule near the site.    Plan to place peripheral IV, give abx, obtain labs and have pt admitted to be monitor for 48 hrs before another PICC line will be placed.    7:07 PM Appreciate consultation from [facility] Dr. Daryll Drown who agrees to see pt in ER and will admit for further care.    BP 118/53 mmHg  Pulse 80  Temp(Src) 98.8 F (37.1 C) (Oral)  Resp 20  SpO2 100%  LMP 05/12/2013  I have reviewed nursing notes and vital signs. I personally viewed the imaging tests through PACS system and agrees with radiologist's intepretation I reviewed available ER/hospitalization records through the EMR  Results for orders placed or performed during the hospital encounter of 06/22/15  CBC with Differential/Platelet  Result Value Ref Range   WBC 9.3 4.0 - 10.5 K/uL   RBC 2.99 (L) 3.87 - 5.11 MIL/uL   Hemoglobin 8.7 (L) 12.0 - 15.0 g/dL   HCT 27.2 (L) 36.0 - 46.0 %   MCV 91.0 78.0 - 100.0 fL   MCH 29.1 26.0 - 34.0 pg   MCHC 32.0 30.0 - 36.0 g/dL   RDW 15.3 11.5 - 15.5 %   Platelets 387 150 - 400 K/uL   Neutrophils Relative % 67 43 - 77 %   Neutro Abs 6.2 1.7 - 7.7 K/uL   Lymphocytes Relative 23 12 - 46 %   Lymphs Abs 2.1 0.7 - 4.0 K/uL   Monocytes Relative 6 3 - 12 %   Monocytes Absolute 0.5 0.1 - 1.0 K/uL   Eosinophils Relative 4 0 - 5 %   Eosinophils Absolute 0.4 0.0 - 0.7 K/uL   Basophils Relative 0 0 - 1 %   Basophils Absolute 0.0 0.0 - 0.1 K/uL  Basic metabolic panel  Result Value Ref Range   Sodium 136 135 - 145 mmol/L   Potassium 3.8 3.5 - 5.1 mmol/L   Chloride 102 101 - 111 mmol/L   CO2 27 22 - 32 mmol/L   Glucose, Bld 315 (H) 65 - 99 mg/dL   BUN 6 6 - 20 mg/dL   Creatinine, Ser 0.62 0.44 - 1.00 mg/dL   Calcium 7.9 (L) 8.9 - 10.3 mg/dL   GFR calc non Af Amer >60 >60 mL/min   GFR calc Af Amer >60 >60 mL/min   Anion gap 7 5 - 15   Dg Chest Port 1 View  06/04/2015   CLINICAL DATA:  Respiratory failure.  Assess endotracheal tube  EXAM: PORTABLE CHEST - 1 VIEW  COMPARISON:  06/03/2015  FINDINGS: The endotracheal tube is in good position between the clavicular heads and carina. Stable subclavian central line from the left, tip at the upper cavoatrial junction. The orogastric tube enters the stomach at least.  Normal heart size and mediastinal contours. No acute infiltrate or edema. No effusion or pneumothorax.  IMPRESSION: 1. Tubes and central line remain in good position. 2. Lungs remain clear.   Electronically Signed   By: Monte Fantasia M.D.   On: 06/04/2015 05:52   Dg Chest Port 1 View  06/03/2015   CLINICAL DATA:  Respiratory failure.  EXAM: PORTABLE CHEST - 1 VIEW  COMPARISON:  06/02/2015  FINDINGS: The cardiac silhouette, mediastinal and hilar contours are within normal limits and stable. The endotracheal tube, NG tube and left subclavian catheters are unchanged. No significant pulmonary findings. No pleural effusion or pneumothorax.  IMPRESSION: 1. Stable support apparatus. 2. No significant pulmonary findings.   Electronically Signed   By: Marijo Sanes M.D.   On: 06/03/2015 07:09   Dg Chest Port 1 View  06/02/2015   CLINICAL DATA:  Septic shock.  History of vaginal cancer.  EXAM: PORTABLE CHEST - 1 VIEW  COMPARISON:  06/02/2015  FINDINGS: Support devices are in stable position. Heart is borderline in size. No confluent airspace opacities or effusions. No acute bony abnormality.  IMPRESSION: No acute findings.  No change.  Stable support devices.   Electronically Signed   By: Rolm Baptise M.D.   On: 06/02/2015 08:06   Dg Chest Port 1 View  06/02/2015   CLINICAL DATA:  Central line placement  EXAM: PORTABLE CHEST - 1 VIEW  COMPARISON:  06/01/2015  FINDINGS:  The endotracheal tube is 2 cm above the carina. The nasogastric tube extends into the stomach. There is a left subclavian central line extending to the low SVC. There is no pneumothorax. The lungs are clear. There is no large effusion.  IMPRESSION: Support equipment appears satisfactorily positioned.  The lungs are grossly clear.  No pneumothorax.   Electronically Signed   By: Andreas Newport M.D.   On: 06/02/2015 06:39   Dg Chest Port 1 View  06/01/2015   CLINICAL DATA:  Sepsis  EXAM: PORTABLE CHEST - 1 VIEW  COMPARISON:  06/27/2014  FINDINGS: A single AP portable view of the chest demonstrates no focal airspace consolidation or alveolar edema. The lungs are grossly clear. There is no large effusion or pneumothorax. Cardiac and mediastinal contours appear unremarkable.  IMPRESSION: No active disease.   Electronically Signed   By: Andreas Newport M.D.   On: 06/01/2015 23:00   Ap / Lateral X-ray Left Foot  06/01/2015   CLINICAL DATA:  Pain and swelling of the foot with discoloration and open wounds.  EXAM: LEFT FOOT - 2 VIEW  COMPARISON:  03/20/2015  FINDINGS: There is prior transmetatarsal amputation of the second digit.  There is extensive soft tissue gas throughout the forefoot, midfoot and hindfoot with marked soft tissue swelling. There is no radiopaque foreign body. There is no frank bony destruction.  IMPRESSION: Extensive soft tissue gas throughout the foot. Soft tissue gas appears to be tracking up the lower leg as well, at the margin of the AP image.   Electronically Signed   By: Andreas Newport M.D.   On: 06/01/2015 23:12      Domenic Moras, PA-C 06/22/15 1908

## 2015-06-22 NOTE — ED Provider Notes (Signed)
CSN: [id number]     Arrival date & time 06/22/15  1343 History   First MD Initiated Contact with Patient 06/22/15 1447     Chief Complaint  Patient presents with  . Vascular Access Problem     (Consider location/radiation/quality/duration/timing/severity/associated sxs/prior Treatment) HPI Comments: Patient with history of diabetes with recent admission to the hospital for gas gangrene of the left foot, status post left below the knee amputation, found to have MSSA bacteremia -- presents with need for a new PICC line. Patient has been receiving 2 g of Ancef 3 times a day and is scheduled to have this through 07/02/15. Patient received a PICC line on 06/12/15 while in the hospital to facilitate administration of her medication. Patient received her dose of antibiotics this morning. She states that her PICC line was leaking and so it was removed by home health nurse. She was told to come to the emergency department to have it replaced. She otherwise denies any medical complaints including fevers. She states that her left lower extremity stump wound is healing well. She has routine dressing changes.   The history is provided by the patient and medical records.    Past Medical History  Diagnosis Date  . Diabetes mellitus   . 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 Janie Morning), Cysto performed by Dr. Jonna Munro 12/2009 with evac of clots and bx and fulguration. // H/O stage 2 SCC of the vulva  . Anemia     2/2 blood loss  . Diabetes mellitus type 2, uncontrolled DX: 2001  . 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 Diabetic foot ulcers   . Cataract   . Diabetes mellitus without complication    Past Surgical History  Procedure Laterality Date  . Radical wide local excision  of the vulva and right nguinal lymph node dissection  10/2003    Dr. Fermin Schwab  . Uterine dilatation and currettage    . Labial mass excision  07/2003    Dr. Ree Edman  . Left second toe mtp joint amputation  12/19/2010    Dr. Mayer Camel  . Irrigation and debridement of left foot with removal of left  01/29/2011    Dr. Mayer Camel  . Tubal ligation    . I&d extremity Left 06/02/2015    Procedure: IRRIGATION AND DEBRIDEMENT EXTREMITY;  Surgeon: Leandrew Koyanagi, MD;  Location: [facility];  Service: Orthopedics;  Laterality: Left;  . Amputation Left 06/02/2015    Procedure: Partial Left  AMPUTATION FOOT;  Surgeon: Leandrew Koyanagi, MD;  Location: [facility];  Service: Orthopedics;  Laterality: Left;  . I&d extremity Left 06/05/2015    Procedure: IRRIGATION AND DEBRIDEMENT LEFT LEG, POSSIBLE CLOSURE BELOW KNEE AMPUTATION;  Surgeon: Leandrew Koyanagi, MD;  Location: [place];  Service: Orthopedics;  Laterality: Left;  . Amputation Left 06/03/2015    Procedure: Left Transtibial amputation,application of wound vac;  Surgeon: Leandrew Koyanagi, MD;  Location: [facility];  Service: Orthopedics;  Laterality: Left;  . I&d extremity Left 06/08/2015    Procedure: IRRIGATION AND DEBRIDEMENT LEFT LEG, WITH  CLOSURE OF BKA;  Surgeon: Leandrew Koyanagi, MD;  Location: [place];  Service: Orthopedics;  Laterality: Left;  . Tee without cardioversion N/A 06/11/2015    Procedure: TRANSESOPHAGEAL ECHOCARDIOGRAM (  TEE);  Surgeon: Lelon Perla, MD;  Location: [facility] ENDOSCOPY;  Service: Cardiovascular;  Laterality: N/A;   Family History  Problem Relation Age of Onset  . Diabetes Mother   . Diabetes Father   . Diabetes Brother    History  Substance Use Topics  . Smoking status: Never Smoker   . Smokeless tobacco: Never Used  . Alcohol Use: No   OB History    No data available     Review of Systems  Constitutional: Negative for fever.  HENT: Negative for rhinorrhea and sore throat.   Eyes: Negative for redness.  Respiratory: Negative for cough.    Cardiovascular: Negative for chest pain.  Gastrointestinal: Negative for nausea, vomiting, abdominal pain and diarrhea.  Genitourinary: Negative for dysuria.  Musculoskeletal: Negative for myalgias.  Skin: Positive for wound (healing IV wounds). Negative for color change and rash.  Neurological: Negative for headaches.    Allergies  Review of patient's allergies indicates no known allergies.  Home Medications   Prior to Admission medications   Medication Sig Start Date End Date Taking? Authorizing Provider  acetaminophen (TYLENOL) 325 MG tablet Take 2 tablets (650 mg total) by mouth every 6 (six) hours as needed for mild pain or moderate pain. 06/18/15   Bary Leriche, PA-C  Blood Glucose Monitoring Suppl (RELION CONFIRM GLUCOSE MONITOR) W/DEVICE KIT 1 kit by Does not apply route 3 (three) times daily. 03/22/15   Costin Karlyne Greenspan, MD  ceFAZolin (ANCEF) 2-3 GM-% SOLR Inject 50 mLs (2 g total) into the vein every 8 (eight) hours. 06/18/15   Bary Leriche, PA-C  glucose monitoring kit (FREESTYLE) monitoring kit 1 each by Does not apply route 4 (four) times daily - after meals and at bedtime. 1 month Diabetic Testing Supplies for QAC-QHS accuchecks. Patient not taking: Reported on 03/20/2015 12/20/14   Lorayne Marek, MD  insulin aspart (NOVOLOG FLEXPEN) 100 UNIT/ML FlexPen Inject 10 Units into the skin 3 (three) times daily with meals. 06/18/15   Bary Leriche, PA-C  Insulin Glargine (LANTUS SOLOSTAR) 100 UNIT/ML Solostar Pen Inject 10 Units into the skin at bedtime. 06/18/15   Bary Leriche, PA-C  Insulin Pen Needle 32G X 4 MM MISC 1 application by Does not apply route 4 (four) times daily -  with meals and at bedtime. 06/18/15   Bary Leriche, PA-C  iron polysaccharides (NIFEREX) 150 MG capsule Take 1 capsule (150 mg total) by mouth 2 times daily at 12 noon and 4 pm. 06/18/15   Bary Leriche, PA-C  Multiple Vitamin (MULTIVITAMIN WITH MINERALS) TABS tablet Take 1 tablet by mouth daily. 06/18/15   Bary Leriche, PA-C  polycarbophil (FIBERCON) 625 MG tablet Take 1 tablet (625 mg total) by mouth 2 (two) times daily. 06/18/15   Bary Leriche, PA-C  simethicone (MYLICON) 80 MG chewable tablet Chew 1 tablet (80 mg total) by mouth 4 (four) times daily as needed for flatulence (for gas and bloating). 06/18/15   Bary Leriche, PA-C  traMADol (ULTRAM) 50 MG tablet Take 0.5-1 tablets (25-50 mg total) by mouth every 6 (six) hours as needed for severe pain. 06/18/15   Ivan Anchors Love, PA-C   BP 102/46 mmHg  Pulse 93  Temp(Src) 98.8 F (37.1 C) (Oral)  Resp 20  SpO2 97%  LMP 05/12/2013   Physical Exam  Constitutional: She appears well-developed and well-nourished.  HENT:  Head: Normocephalic and atraumatic.  Eyes: Conjunctivae are normal. Right eye exhibits no discharge. Left  eye exhibits no discharge.  Neck: Normal range of motion. Neck supple.  Cardiovascular: Normal rate, regular rhythm and normal heart sounds.   No murmur heard. Pulmonary/Chest: Effort normal and breath sounds normal. No respiratory distress. She has no wheezes. She has no rales.  Abdominal: Soft. There is no tenderness. There is no rebound and no guarding.  Musculoskeletal: She exhibits no tenderness.  L BKA with bandage over stump.   Neurological: She is alert.  Skin: Skin is warm and dry. No rash noted. There is erythema.  Patient has healing IV puncture sites in R antecubital fossa. There is a small <1cm nodule just proximal to PICC puncture site with slight overlying erythema. No tenderness or drainage.   Psychiatric: She has a normal mood and affect.  Nursing note and vitals reviewed.   ED Course  Procedures (including critical care time) Labs Review Labs Reviewed  CULTURE, BLOOD (ROUTINE X 2)  CULTURE, BLOOD (ROUTINE X 2)  CBC WITH DIFFERENTIAL/PLATELET  BASIC METABOLIC PANEL    Imaging Review No results found.   EKG Interpretation None      3:10 PM Patient seen and examined. Work-up initiated. Medications  ordered.   Vital signs reviewed and are as follows: BP 102/46 mmHg  Pulse 93  Temp(Src) 98.8 F (37.1 C) (Oral)  Resp 20  SpO2 97%  LMP 05/12/2013  5:21 PM IV team nurse has seen. She has reservations about replacing PICC as there is a small 'knot' at puncture site with mild overlying redness which may be sign of infection.   Will admit for IV antibiotics until arrangements can be made for safe placement of PICC. Labs ordered, blood cultures ordered.   Handoff to [employer] at shift change who will admit when results return.    MDM   Final diagnoses:  Right arm cellulitis   Admit.    Carlisle Cater, PA-C 06/22/15 1723  Gareth Morgan, MD 06/22/15 [id number]

## 2015-06-22 NOTE — Progress Notes (Signed)
NURSING PROGRESS NOTE  [name] [id number] Admission Data: 06/22/2015 8:34 PM Attending Provider: Sid Falcon, MD [id number], Vernon Prey, MD Code Status: full    [name] is a 49 y.o. female patient admitted from ED:  -No acute distress noted.  -No complaints of shortness of breath.  -No complaints of chest pain.   Cardiac Monitoring: N/A   Blood pressure 108/56, pulse 86, temperature 98.2 F (36.8 C), temperature source Oral, resp. rate 18, last menstrual period 05/12/2013, SpO2 99 %.   IV Fluids:  IV in place, occlusive dsg intact without redness, IV cath hand left, condition patent none.   Allergies:  Review of patient's allergies indicates no known allergies.  Past Medical History:   has a past medical history of Diabetes mellitus; Vaginal cancer; Anemia; Diabetes mellitus type 2, uncontrolled (DX: 2001); Osteomyelitis; Diabetic foot ulcers; Cataract; and Diabetes mellitus without complication.  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  Social History:   reports that she has never smoked. She has never used smokeless tobacco. She reports that she does not drink alcohol or use illicit drugs.  Skin: Lt BKA, compression wrap  on   Patient/Family orientated to room. Information packet given to patient/family. Admission inpatient armband information verified with patient/family to include name and date of birth and placed on patient arm. Side rails  up x 2, fall assessment and education completed with patient/family.

## 2015-06-22 NOTE — ED Notes (Signed)
Internal medicine at bedside

## 2015-06-22 NOTE — ED Notes (Signed)
Pt. Stated dialysis site leaking, not visible at triage.

## 2015-06-23 DIAGNOSIS — R7881 Bacteremia: Secondary | ICD-10-CM

## 2015-06-23 DIAGNOSIS — B9561 Methicillin susceptible Staphylococcus aureus infection as the cause of diseases classified elsewhere: Secondary | ICD-10-CM

## 2015-06-23 LAB — BASIC METABOLIC PANEL
Anion gap: 7 (ref 5–15)
BUN: 7 mg/dL (ref 6–20)
CO2: 26 mmol/L (ref 22–32)
Calcium: 7.7 mg/dL — ABNORMAL LOW (ref 8.9–10.3)
Chloride: 105 mmol/L (ref 101–111)
Creatinine, Ser: 0.45 mg/dL (ref 0.44–1.00)
Glucose, Bld: 250 mg/dL — ABNORMAL HIGH (ref 65–99)
POTASSIUM: 3.3 mmol/L — AB (ref 3.5–5.1)
Sodium: 138 mmol/L (ref 135–145)

## 2015-06-23 LAB — GLUCOSE, CAPILLARY
GLUCOSE-CAPILLARY: 208 mg/dL — AB (ref 65–99)
Glucose-Capillary: 205 mg/dL — ABNORMAL HIGH (ref 65–99)
Glucose-Capillary: 91 mg/dL (ref 65–99)
Glucose-Capillary: 115 mg/dL — ABNORMAL HIGH (ref 65–99)

## 2015-06-23 LAB — CBC
HCT: 25.1 % — ABNORMAL LOW (ref 36.0–46.0)
HEMOGLOBIN: 8 g/dL — AB (ref 12.0–15.0)
MCH: 28.7 pg (ref 26.0–34.0)
MCHC: 31.9 g/dL (ref 30.0–36.0)
MCV: 90 fL (ref 78.0–100.0)
Platelets: 367 10*3/uL (ref 150–400)
RBC: 2.79 MIL/uL — ABNORMAL LOW (ref 3.87–5.11)
RDW: 15.3 % (ref 11.5–15.5)
WBC: 7.9 10*3/uL (ref 4.0–10.5)

## 2015-06-23 MED ORDER — INSULIN GLARGINE 100 UNIT/ML ~~LOC~~ SOLN
15.0000 [IU] | Freq: Two times a day (BID) | SUBCUTANEOUS | Status: DC
  Administered 2015-06-23 – 2015-06-24 (×3): 15 [IU] via SUBCUTANEOUS
  Filled 2015-06-23 (×4): qty 0.15

## 2015-06-23 MED ORDER — POTASSIUM CHLORIDE CRYS ER 20 MEQ PO TBCR
40.0000 meq | EXTENDED_RELEASE_TABLET | ORAL | Status: AC
  Administered 2015-06-23 (×2): 40 meq via ORAL
  Filled 2015-06-23 (×2): qty 2

## 2015-06-23 MED ORDER — INSULIN ASPART 100 UNIT/ML ~~LOC~~ SOLN
6.0000 [IU] | Freq: Three times a day (TID) | SUBCUTANEOUS | Status: DC
  Administered 2015-06-23 – 2015-06-24 (×3): 6 [IU] via SUBCUTANEOUS

## 2015-06-23 NOTE — Progress Notes (Signed)
Patient Demographics:    [name], is a 49 y.o. female, DOB - Jun 12, 1966, GEX:[id number]  Admit date - 06/22/2015   Admitting Physician Inez Catalina, MD  Outpatient Primary MD for the patient is Doris Cheadle, MD  LOS - 1   Chief Complaint  Patient presents with  . Vascular Access Problem        Subjective:    [name] today has, No headache, No chest pain, No abdominal pain - No Nausea, No new weakness tingling or numbness, No Cough - SOB.    Assessment  & Plan :     1. Right arm PICC line site infection. PICC line removed. New PICC line in left arm tomorrow if blood cultures remain stable. Right arm PICC line site appears stable with maximum 2 mm small induration. No fluctuance or streaking.  2. MSSA bacteremia diagnosed last admission. Continue IV cefazolin through peripheral IV for now, stop date 06/04/2015. Follow with Dr. Orvan Falconer post discharge.  3. Left BKA. Follow with Dr. Roda Shutters post discharge  4. Hypokalemia. Replaced, recheck in the morning.  5. History of stage IV vulvar cancer. Outpatient oncology follow-up post discharge.  6. Anemia of chronic disease. No acute issues.  7. DM type II. On Lantus dose increased for better control, on pre-meal NovoLog dose dropped a little bit, continue sliding scale continue to monitor.  Lab Results  Component Value Date   HGBA1C 11.7* 06/01/2015    CBG (last 3)   Recent Labs  06/22/15 2155 06/23/15 0852  GLUCAP 238* 205*     Code Status : Full  Family Communication  : none present  Disposition Plan  : Home in am  Consults  :     Procedures  :   DVT Prophylaxis  :  Lovenox    Lab Results  Component Value Date   PLT 367 06/23/2015    Inpatient Medications  Scheduled Meds: . ceFAZolin  2 g  Intravenous Q8H  . enoxaparin (LOVENOX) injection  40 mg Subcutaneous Q24H  . insulin aspart  0-9 Units Subcutaneous TID WC  . insulin aspart  10 Units Subcutaneous TID WC  . insulin glargine  10 Units Subcutaneous QHS  . multivitamin with minerals  1 tablet Oral Daily   Continuous Infusions:  PRN Meds:.senna-docusate, simethicone, traMADol  Antibiotics  :    Anti-infectives    Start     Dose/Rate Route Frequency Ordered Stop   06/23/15 0200  ceFAZolin (ANCEF) IVPB 2 g/50 mL premix     2 g 100 mL/hr over 30 Minutes Intravenous Every 8 hours 06/22/15 2025     06/22/15 1700  ceFAZolin (ANCEF) IVPB 2 g/50 mL premix     2 g 100 mL/hr over 30 Minutes Intravenous  Once 06/22/15 1657 06/22/15 1843        Objective:   Filed Vitals:   06/22/15 1945 06/22/15 2000 06/22/15 2026 06/23/15 0506  BP:   108/56 100/52  Pulse: 82 86 86 83  Temp:   98.2 F (36.8 C) 98.6 F (37 C)  TempSrc:   Oral Oral  Resp:   18 18  Height:   4\' 11"  (1.499 m)   Weight:   53.1 kg (117 lb 1 oz)   SpO2:  100% 98% 99% 99%    Wt Readings from Last 3 Encounters:  06/22/15 53.1 kg (117 lb 1 oz)  06/12/15 52.073 kg (114 lb 12.8 oz)  06/06/15 52.5 kg (115 lb 11.9 oz)    No intake or output data in the 24 hours ending 06/23/15 1008   Physical Exam  Awake Alert, Oriented X 3, No new F.N deficits, Normal affect [place].AT,PERRAL Supple Neck,No JVD, No cervical lymphadenopathy appriciated.  Symmetrical Chest wall movement, Good air movement bilaterally, CTAB RRR,No Gallops,Rubs or new Murmurs, No Parasternal Heave +ve B.Sounds, Abd Soft, No tenderness, No organomegaly appriciated, No rebound - guarding or rigidity. No Cyanosis, Clubbing or edema, No new Rash or bruise, right arm PICC line site has 2 mm small induration, no surrounding cellulitis or streaking no palpable fluctuance, left BKA stump under bandage    Data Review:   Micro Results No results found for this or any previous visit (from the past 240  hour(s)).  Radiology Reports     CBC  Recent Labs Lab 06/17/15 0540 06/22/15 1701 06/23/15 0450  WBC 4.9 9.3 7.9  HGB 8.0* 8.7* 8.0*  HCT 25.0* 27.2* 25.1*  PLT 530* 387 367  MCV 89.0 91.0 90.0  MCH 28.5 29.1 28.7  MCHC 32.0 32.0 31.9  RDW 13.7 15.3 15.3  LYMPHSABS 2.1 2.1  --   MONOABS 0.4 0.5  --   EOSABS 0.2 0.4  --   BASOSABS 0.1 0.0  --     Chemistries   Recent Labs Lab 06/17/15 0540 06/22/15 1701 06/23/15 0450  NA 137 136 138  K 4.3 3.8 3.3*  CL 102 102 105  CO2 29 27 26   GLUCOSE 351* 315* 250*  BUN 16 6 7   CREATININE 0.65 0.62 0.45  CALCIUM 8.1* 7.9* 7.7*   ------------------------------------------------------------------------------------------------------------------ estimated creatinine clearance is 62.7 mL/min (by C-G formula based on Cr of 0.45). ------------------------------------------------------------------------------------------------------------------ No results for input(s): HGBA1C in the last 72 hours. ------------------------------------------------------------------------------------------------------------------ No results for input(s): CHOL, HDL, LDLCALC, TRIG, CHOLHDL, LDLDIRECT in the last 72 hours. ------------------------------------------------------------------------------------------------------------------ No results for input(s): TSH, T4TOTAL, T3FREE, THYROIDAB in the last 72 hours.  Invalid input(s): FREET3 ------------------------------------------------------------------------------------------------------------------ No results for input(s): VITAMINB12, FOLATE, FERRITIN, TIBC, IRON, RETICCTPCT in the last 72 hours.  Coagulation profile No results for input(s): INR, PROTIME in the last 168 hours.  No results for input(s): DDIMER in the last 72 hours.  Cardiac Enzymes No results for input(s): CKMB, TROPONINI, MYOGLOBIN in the last 168 hours.  Invalid input(s):  CK ------------------------------------------------------------------------------------------------------------------ Invalid input(s): POCBNP   Time Spent in minutes   30   Samia Kukla K M.D on 06/23/2015 at 10:08 AM  Between 7am to 7pm - Pager - [phone]  After 7pm go to [email or web address] - password [facility]  [facility] -  Office  [phone]

## 2015-06-24 LAB — GLUCOSE, CAPILLARY
GLUCOSE-CAPILLARY: 121 mg/dL — AB (ref 65–99)
Glucose-Capillary: 228 mg/dL — ABNORMAL HIGH (ref 65–99)

## 2015-06-24 LAB — CBC
HCT: 26.6 % — ABNORMAL LOW (ref 36.0–46.0)
Hemoglobin: 8.4 g/dL — ABNORMAL LOW (ref 12.0–15.0)
MCH: 29.2 pg (ref 26.0–34.0)
MCHC: 31.6 g/dL (ref 30.0–36.0)
MCV: 92.4 fL (ref 78.0–100.0)
Platelets: 379 10*3/uL (ref 150–400)
RBC: 2.88 MIL/uL — ABNORMAL LOW (ref 3.87–5.11)
RDW: 15.1 % (ref 11.5–15.5)
WBC: 6.3 10*3/uL (ref 4.0–10.5)

## 2015-06-24 LAB — POTASSIUM: POTASSIUM: 4.6 mmol/L (ref 3.5–5.1)

## 2015-06-24 MED ORDER — HEPARIN SOD (PORK) LOCK FLUSH 100 UNIT/ML IV SOLN
250.0000 [IU] | INTRAVENOUS | Status: AC | PRN
  Administered 2015-06-24: 250 [IU]

## 2015-06-24 MED ORDER — CEFAZOLIN SODIUM-DEXTROSE 2-3 GM-% IV SOLR: 2.0000 g | Freq: Three times a day (TID) | INTRAVENOUS | Status: DC

## 2015-06-24 MED ORDER — INSULIN GLARGINE 100 UNIT/ML SOLOSTAR PEN: 20.0000 [IU] | PEN_INJECTOR | Freq: Two times a day (BID) | SUBCUTANEOUS | Status: DC

## 2015-06-24 MED ORDER — SODIUM CHLORIDE 0.9 % IJ SOLN
10.0000 mL | INTRAMUSCULAR | Status: DC | PRN
  Administered 2015-06-24: 10 mL
  Filled 2015-06-24: qty 40

## 2015-06-24 MED ORDER — INSULIN GLARGINE 100 UNIT/ML SOLOSTAR PEN: 15.0000 [IU] | PEN_INJECTOR | Freq: Two times a day (BID) | SUBCUTANEOUS | Status: DC

## 2015-06-24 NOTE — Progress Notes (Signed)
Pt discharging to home, New PICC placed to left Upper Arm and heparin Locked by IV Team. PIV dc'd, Vitals stable for pt. L BKA dressing changed and C/D/I. Education and discharge given to pt by Spanish interuptor. 06/24/2015 12:41 PM Su [place]

## 2015-06-24 NOTE — Discharge Instructions (Signed)
Follow with Primary MD Lorayne Marek, MD in 7 days   Get CBC, CMP, 2 view Chest X ray checked  by Primary MD next visit.    Activity: As tolerated with Full fall precautions use walker/cane & assistance as needed   Disposition Home    Diet: Heart Healthy Low Carb.  Accuchecks 4 times/day, Once in AM empty stomach and then before each meal. Log in all results and show them to your Prim.MD in 3 days. If any glucose reading is under 80 or above 300 call your Prim MD immidiately. Follow Low glucose instructions for glucose under 80 as instructed.   For Heart failure patients - Check your Weight same time everyday, if you gain over 2 pounds, or you develop in leg swelling, experience more shortness of breath or chest pain, call your Primary MD immediately. Follow Cardiac Low Salt Diet and 1.5 lit/day fluid restriction.   On your next visit with your primary care physician please Get Medicines reviewed and adjusted.   Please request your Prim.MD to go over all Hospital Tests and Procedure/Radiological results at the follow up, please get all Hospital records sent to your Prim MD by signing hospital release before you go home.   If you experience worsening of your admission symptoms, develop shortness of breath, life threatening emergency, suicidal or homicidal thoughts you must seek medical attention immediately by calling 911 or calling your MD immediately  if symptoms less severe.  You Must read complete instructions/literature along with all the possible adverse reactions/side effects for all the Medicines you take and that have been prescribed to you. Take any new Medicines after you have completely understood and accpet all the possible adverse reactions/side effects.   Do not drive, operating heavy machinery, perform activities at heights, swimming or participation in water activities or provide baby sitting services if your were admitted for syncope or siezures until you have seen  by Primary MD or a Neurologist and advised to do so again.  Do not drive when taking Pain medications.    Do not take more than prescribed Pain, Sleep and Anxiety Medications  Special Instructions: If you have smoked or chewed Tobacco  in the last 2 yrs please stop smoking, stop any regular Alcohol  and or any Recreational drug use.  Wear Seat belts while driving.   Please note  You were cared for by a hospitalist during your hospital stay. If you have any questions about your discharge medications or the care you received while you were in the hospital after you are discharged, you can call the unit and asked to speak with the hospitalist on call if the hospitalist that took care of you is not available. Once you are discharged, your primary care physician will handle any further medical issues. Please note that NO REFILLS for any discharge medications will be authorized once you are discharged, as it is imperative that you return to your primary care physician (or establish a relationship with a primary care physician if you do not have one) for your aftercare needs so that they can reassess your need for medications and monitor your lab values.

## 2015-06-24 NOTE — Progress Notes (Signed)
Pt had bleeding from his Lt BKA site while pt was in bathroom. Dressing changed and wrapped with kerlix. VSS. On-call provider Lamar Blinks, NP notified via text page. 0533 order received for CBC. 0548 pt c/o pain in amputation site, medicated with prn tramadol. Will cont to monitor.

## 2015-06-24 NOTE — Progress Notes (Signed)
Peripherally Inserted Central Catheter/Midline Placement  The IV Nurse has discussed with the patient and/or persons authorized to consent for the patient, the purpose of this procedure and the potential benefits and risks involved with this procedure.  The benefits include less needle sticks, lab draws from the catheter and patient may be discharged home with the catheter.  Risks include, but not limited to, infection, bleeding, blood clot (thrombus formation), and puncture of an artery; nerve damage and irregular heat beat.  Alternatives to this procedure were also discussed.  PICC/Midline Placement Documentation  PICC / Midline Single Lumen 16/10/96 PICC Left Basilic 41 cm 2 cm (Active)  Indication for Insertion or Continuance of Line Home intravenous therapies (PICC only) 06/24/2015 11:00 AM  Exposed Catheter (cm) 2 cm 06/24/2015 11:00 AM  Dressing Change Due 07/01/15 06/24/2015 11:00 AM       [name] 06/24/2015, 11:50 AM

## 2015-06-24 NOTE — Care Management Note (Signed)
Case Management Note  Patient Details  Name: Ronya Gilcrest MRN: [id number] Date of Birth: [birth year]  Subjective/Objective:                 Patient from home with spouse. Readmission from last week with PICC line infection. [facility] will continue to follow for IV Abx p dc.    Action/Plan:  Discharge to home with spouse, [place] for PT OT RN Aide referral made to [facility] with [place] at 10:00 06-24-15. Carolynn Sayers RN [facility] IV services following with patient and will watch for DC order today per our conversation. 09:45 06-24-15. Expected Discharge Date:                  Expected Discharge Plan:  [place]  In-House Referral:     Discharge planning Services  CM Consult  Post Acute Care Choice:  Home Health Choice offered to:     DME Arranged:    DME Agency:     HH Arranged:  RN, PT, OT, Nurse's Aide [place] Agency:  [place]  Status of Service:  Completed, signed off  Medicare Important Message Given:    Date Medicare IM Given:    Medicare IM give by:    Date Additional Medicare IM Given:    Additional Medicare Important Message give by:     If discussed at [place] of Stay Meetings, dates discussed:    Additional Comments:  Carles Collet, RN 06/24/2015, 9:57 AM

## 2015-06-24 NOTE — Discharge Summary (Signed)
[name], is a 49 y.o. female  DOB 1966-11-28  MRN [id number].  Admission date:  06/22/2015  Admitting Physician  Sid Falcon, MD  Discharge Date:  06/24/2015   Primary MD  Lorayne Marek, MD  Recommendations for primary care physician for things to follow:   Must follow with ID and orthopedics closely.  Recheck right arm old PICC line site for signs of infection  Repeat CBC, CMP in a week. Follow final blood culture results.   Admission Diagnosis  Right arm cellulitis [B20.100]   Discharge Diagnosis  Right arm cellulitis [F12.197]    Principal Problem:   Staphylococcus aureus bacteremia Active Problems:   DM type 2 (diabetes mellitus, type 2)   Anemia of chronic disease   Status post below knee amputation of left lower extremity   Bacteremia   Staphylococcus aureus bacteremia without sepsis      Past Medical History  Diagnosis Date  . Diabetes mellitus   . 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 Janie Morning), Cysto performed by Dr. Jonna Munro 12/2009 with evac of clots and bx and fulguration. // H/O stage 2 SCC of the vulva  . Anemia     2/2 blood loss  . Diabetes mellitus type 2, uncontrolled DX: 2001  . 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 Diabetic foot ulcers   . Cataract   . Diabetes mellitus without complication     Past Surgical History  Procedure Laterality Date  . Radical wide local excision of the vulva and right nguinal lymph node dissection  10/2003    Dr. Fermin Schwab  . Uterine dilatation and currettage    . Labial mass excision  07/2003    Dr. Ree Edman  . Left second toe mtp joint amputation  12/19/2010    Dr. Mayer Camel  . Irrigation and debridement of left foot with removal of left  01/29/2011    Dr. Mayer Camel  . Tubal ligation    . I&d extremity Left 06/02/2015    Procedure: IRRIGATION AND DEBRIDEMENT EXTREMITY;  Surgeon: Leandrew Koyanagi, MD;  Location: [facility];  Service: Orthopedics;  Laterality: Left;  . Amputation Left 06/02/2015    Procedure: Partial Left  AMPUTATION FOOT;  Surgeon: Leandrew Koyanagi, MD;  Location: [facility];  Service: Orthopedics;  Laterality: Left;  . I&d extremity Left 06/05/2015    Procedure: IRRIGATION AND DEBRIDEMENT LEFT LEG, POSSIBLE CLOSURE BELOW KNEE AMPUTATION;  Surgeon: Leandrew Koyanagi, MD;  Location: [place];  Service: Orthopedics;  Laterality: Left;  . Amputation Left 06/03/2015    Procedure: Left Transtibial amputation,application of wound vac;  Surgeon: Leandrew Koyanagi, MD;  Location: [facility];  Service: Orthopedics;  Laterality: Left;  . I&d extremity Left 06/08/2015    Procedure: IRRIGATION AND DEBRIDEMENT LEFT LEG, WITH  CLOSURE OF BKA;  Surgeon: Leandrew Koyanagi, MD;  Location: [place];  Service: Orthopedics;  Laterality: Left;  . Tee without cardioversion N/A 06/11/2015    Procedure: TRANSESOPHAGEAL ECHOCARDIOGRAM (TEE);  Surgeon: Lelon Perla, MD;  Location: [facility];  Service: Cardiovascular;  Laterality: N/A;       HPI  from the history and physical done on the day of admission:    [name] is a 49yo [place] speaking woman with PMH of DM2, anemia, vulvar cancer, osteomyelitis s/p BKA on the left and recent MSSA bacteremia who presents for concern of line infection. Apparently, today, [name]'s home health nurse pulled her PICC line b/c she ws concerned that the site was infected and was draining some pus. She was brought to the ED for replacement of PICC line. The IV team evaluated the line here and felt that the site was still infected given redness and a subcutaneous nodule at the site. They preferred to  leave the line out for at least 24-48 hours and place a PIV. This was done and the patient received a dose of her Ancef as planned.   Other than the line being pulled, [name] has no complaints. She is healing well from her BKA and does not have any pain or issues. She has no fever chills, nausea, vomiting. She was not completely understanding of why the line was taken out and thought it was just time to change the line. We discussed the reasons for admission.   [name] was admitted from 7/2 - 7/13 for severe sepsis due to MSSA. She had gangrene of the foot and surgery was done as noted above. She was discharged with PICC line to complete 3 more weeks of Abx. By my math, end date would be 07/02/15. She had a TEE during that admission and had no endocarditis.     Hospital Course:     1. Right arm PICC line site infection. PICC line removed. New PICC line in left arm will be placed today, repeat blood cultures are negative thus far we'll request PCP to check final culture results upon next visit drawn from this admission . Right arm PICC line site appears stable with no fluctuance or cellulitis. No streaking.  2. MSSA bacteremia diagnosed last admission. Continue IV cefazolin through peripheral IV for now, stop date 06/04/2015. Follow with Dr. Megan Salon post discharge.  3. Left BKA. Follow with Dr. Erlinda Hong post discharge  4. Hypokalemia. Replaced, stable.  5. History of stage IV vulvar cancer. Outpatient oncology follow-up post discharge.  6. Anemia of chronic disease. No acute issues.  7. DM type II. On Lantus dose increased for better control, on pre-meal NovoLog which will be continued. Requested to do Accu-Cheks every before meals at bedtime maintain a logbook and show it to PCP next visit.     Discharge Condition: Stable  Follow UP  Follow-up Information    Follow up with Lorayne Marek, MD. Schedule an appointment as soon as possible for a visit in 1 week.    Specialty:  Internal Medicine   Contact information:   [place] [address] [phone]       Follow up with Michel Bickers, MD. Schedule an appointment as soon as possible for a visit in 1 week.   Specialty:  Infectious Diseases   Why:  MSSA bacteremia   Contact information:   [address] [place] [address] [phone]       Follow  up with Marianna Payment, MD. Schedule an appointment as soon as possible for a visit in 1 week.   Specialty:  Orthopedic Surgery   Why:  Left BKA   Contact information:   [place] [address] [phone]        Consults obtained - None  Diet and Activity recommendation: See Discharge Instructions below  Discharge Instructions         Discharge Instructions    Discharge instructions    Complete by:  As directed   Follow with Primary MD Lorayne Marek, MD in 7 days   Get CBC, CMP, 2 view Chest X ray checked  by Primary MD next visit.    Activity: As tolerated with Full fall precautions use walker/cane & assistance as needed   Disposition Home    Diet: Heart Healthy Low Carb.  Accuchecks 4 times/day, Once in AM empty stomach and then before each meal. Log in all results and show them to your Prim.MD in 3 days. If any glucose reading is under 80 or above 300 call your Prim MD immidiately. Follow Low glucose instructions for glucose under 80 as instructed.   For Heart failure patients - Check your Weight same time everyday, if you gain over 2 pounds, or you develop in leg swelling, experience more shortness of breath or chest pain, call your Primary MD immediately. Follow Cardiac Low Salt Diet and 1.5 lit/day fluid restriction.   On your next visit with your primary care physician please Get Medicines reviewed and adjusted.   Please request your Prim.MD to go over all Hospital Tests and Procedure/Radiological results at the follow up, please get all Hospital  records sent to your Prim MD by signing hospital release before you go home.   If you experience worsening of your admission symptoms, develop shortness of breath, life threatening emergency, suicidal or homicidal thoughts you must seek medical attention immediately by calling 911 or calling your MD immediately  if symptoms less severe.  You Must read complete instructions/literature along with all the possible adverse reactions/side effects for all the Medicines you take and that have been prescribed to you. Take any new Medicines after you have completely understood and accpet all the possible adverse reactions/side effects.   Do not drive, operating heavy machinery, perform activities at heights, swimming or participation in water activities or provide baby sitting services if your were admitted for syncope or siezures until you have seen by Primary MD or a Neurologist and advised to do so again.  Do not drive when taking Pain medications.    Do not take more than prescribed Pain, Sleep and Anxiety Medications  Special Instructions: If you have smoked or chewed Tobacco  in the last 2 yrs please stop smoking, stop any regular Alcohol  and or any Recreational drug use.  Wear Seat belts while driving.   Please note  You were cared for by a hospitalist during your hospital stay. If you have any questions about your discharge medications or the care you received while you were in the hospital after you are discharged, you can call the unit and asked to speak with the hospitalist on call if the hospitalist that took care of you is not available. Once you are discharged, your primary care physician will handle any further medical issues. Please note that NO REFILLS for any discharge medications will be authorized once you are discharged, as it is imperative that you return to your primary care physician (or  establish a relationship with a primary care physician if you do not have one) for your  aftercare needs so that they can reassess your need for medications and monitor your lab values.     Increase activity slowly    Complete by:  As directed              Discharge Medications       Medication List    TAKE these medications        acetaminophen 325 MG tablet  Commonly known as:  TYLENOL  Take 2 tablets (650 mg total) by mouth every 6 (six) hours as needed for mild pain or moderate pain.     ceFAZolin 2-3 GM-% Solr  Commonly known as:  ANCEF  Inject 50 mLs (2 g total) into the vein every 8 (eight) hours.     glucose monitoring kit monitoring kit  1 each by Does not apply route 4 (four) times daily - after meals and at bedtime. 1 month Diabetic Testing Supplies for QAC-QHS accuchecks.     RELION CONFIRM GLUCOSE MONITOR W/DEVICE Kit  1 kit by Does not apply route 3 (three) times daily.     insulin aspart 100 UNIT/ML FlexPen  Commonly known as:  NOVOLOG FLEXPEN  Inject 10 Units into the skin 3 (three) times daily with meals.     Insulin Glargine 100 UNIT/ML Solostar Pen  Commonly known as:  LANTUS SOLOSTAR  Inject 15 Units into the skin 2 (two) times daily.     Insulin Pen Needle 32G X 4 MM Misc  1 application by Does not apply route 4 (four) times daily -  with meals and at bedtime.     iron polysaccharides 150 MG capsule  Commonly known as:  NIFEREX  Take 1 capsule (150 mg total) by mouth 2 times daily at 12 noon and 4 pm.     multivitamin with minerals Tabs tablet  Take 1 tablet by mouth daily.     polycarbophil 625 MG tablet  Commonly known as:  FIBERCON  Take 1 tablet (625 mg total) by mouth 2 (two) times daily.     simethicone 80 MG chewable tablet  Commonly known as:  MYLICON  Chew 1 tablet (80 mg total) by mouth 4 (four) times daily as needed for flatulence (for gas and bloating).     traMADol 50 MG tablet  Commonly known as:  ULTRAM  Take 0.5-1 tablets (25-50 mg total) by mouth every 6 (six) hours as needed for severe pain.         Major procedures and Radiology Reports - PLEASE review detailed and final reports for all details, in brief -       Dg Chest Port 1 View  06/04/2015   CLINICAL DATA:  Respiratory failure.  Assess endotracheal tube  EXAM: PORTABLE CHEST - 1 VIEW  COMPARISON:  06/03/2015  FINDINGS: The endotracheal tube is in good position between the clavicular heads and carina. Stable subclavian central line from the left, tip at the upper cavoatrial junction. The orogastric tube enters the stomach at least.  Normal heart size and mediastinal contours. No acute infiltrate or edema. No effusion or pneumothorax.  IMPRESSION: 1. Tubes and central line remain in good position. 2. Lungs remain clear.   Electronically Signed   By: Monte Fantasia M.D.   On: 06/04/2015 05:52   Dg Chest Port 1 View  06/03/2015   CLINICAL DATA:  Respiratory failure.  EXAM: PORTABLE CHEST - 1 VIEW  COMPARISON:  06/02/2015  FINDINGS: The cardiac silhouette, mediastinal and hilar contours are within normal limits and stable. The endotracheal tube, NG tube and left subclavian catheters are unchanged. No significant pulmonary findings. No pleural effusion or pneumothorax.  IMPRESSION: 1. Stable support apparatus. 2. No significant pulmonary findings.   Electronically Signed   By: Marijo Sanes M.D.   On: 06/03/2015 07:09   Dg Chest Port 1 View  06/02/2015   CLINICAL DATA:  Septic shock.  History of vaginal cancer.  EXAM: PORTABLE CHEST - 1 VIEW  COMPARISON:  06/02/2015  FINDINGS: Support devices are in stable position. Heart is borderline in size. No confluent airspace opacities or effusions. No acute bony abnormality.  IMPRESSION: No acute findings.  No change.  Stable support devices.   Electronically Signed   By: Rolm Baptise M.D.   On: 06/02/2015 08:06   Dg Chest Port 1 View  06/02/2015   CLINICAL DATA:  Central line placement  EXAM: PORTABLE CHEST - 1 VIEW  COMPARISON:  06/01/2015  FINDINGS: The endotracheal tube is 2 cm above the carina.  The nasogastric tube extends into the stomach. There is a left subclavian central line extending to the low SVC. There is no pneumothorax. The lungs are clear. There is no large effusion.  IMPRESSION: Support equipment appears satisfactorily positioned.  The lungs are grossly clear.  No pneumothorax.   Electronically Signed   By: Andreas Newport M.D.   On: 06/02/2015 06:39   Dg Chest Port 1 View  06/01/2015   CLINICAL DATA:  Sepsis  EXAM: PORTABLE CHEST - 1 VIEW  COMPARISON:  06/27/2014  FINDINGS: A single AP portable view of the chest demonstrates no focal airspace consolidation or alveolar edema. The lungs are grossly clear. There is no large effusion or pneumothorax. Cardiac and mediastinal contours appear unremarkable.  IMPRESSION: No active disease.   Electronically Signed   By: Andreas Newport M.D.   On: 06/01/2015 23:00   Ap / Lateral X-ray Left Foot  06/01/2015   CLINICAL DATA:  Pain and swelling of the foot with discoloration and open wounds.  EXAM: LEFT FOOT - 2 VIEW  COMPARISON:  03/20/2015  FINDINGS: There is prior transmetatarsal amputation of the second digit.  There is extensive soft tissue gas throughout the forefoot, midfoot and hindfoot with marked soft tissue swelling. There is no radiopaque foreign body. There is no frank bony destruction.  IMPRESSION: Extensive soft tissue gas throughout the foot. Soft tissue gas appears to be tracking up the lower leg as well, at the margin of the AP image.   Electronically Signed   By: Andreas Newport M.D.   On: 06/01/2015 23:12    Micro Results      Recent Results (from the past 240 hour(s))  Culture, blood (routine x 2)     Status: None (Preliminary result)   Collection Time: 06/22/15  5:01 PM  Result Value Ref Range Status   Specimen Description BLOOD RIGHT ARM  Final   Special Requests BOTTLES DRAWN AEROBIC AND ANAEROBIC 5CC  Final   Culture NO GROWTH < 24 HOURS  Final   Report Status PENDING  Incomplete  Culture, blood (routine x  2)     Status: None (Preliminary result)   Collection Time: 06/22/15  5:14 PM  Result Value Ref Range Status   Specimen Description BLOOD LEFT ARM  Final   Special Requests BOTTLES DRAWN AEROBIC AND ANAEROBIC 5CC  Final   Culture NO GROWTH < 24 HOURS  Final   Report Status PENDING  Incomplete       Today   Subjective    [name] today has no headache,no chest abdominal pain,no new weakness tingling or numbness, feels much better wants to go home today.    Objective   Blood pressure 125/64, pulse 98, temperature 98.6 F (37 C), temperature source Oral, resp. rate 14, height 4' 11"  (1.499 m), weight 53.1 kg (117 lb 1 oz), last menstrual period 05/12/2013, SpO2 100 %.   Intake/Output Summary (Last 24 hours) at 06/24/15 0848 Last data filed at 06/24/15 0600  Gross per 24 hour  Intake    540 ml  Output      0 ml  Net    540 ml    Exam Awake Alert, Oriented x 3, No new F.N deficits, Normal affect .AT,PERRAL Supple Neck,No JVD, No cervical lymphadenopathy appriciated.  Symmetrical Chest wall movement, Good air movement bilaterally, CTAB RRR,No Gallops,Rubs or new Murmurs, No Parasternal Heave +ve B.Sounds, Abd Soft, Non tender, No organomegaly appriciated, No rebound -guarding or rigidity. No Cyanosis, Clubbing or edema, No new Rash or bruise, left BKA stump under bandage, right arm PICC line site appears stable, no signs of fluctuance, cellulitis.   Data Review   CBC w Diff:  Lab Results  Component Value Date   WBC 6.3 06/24/2015   WBC 5.9 10/05/2012   WBC 4.0 05/21/2011   HGB 8.4* 06/24/2015   HGB 11.9* 10/05/2012   HGB 12.7 05/21/2011   HCT 26.6* 06/24/2015   HCT 39.2 10/05/2012   HCT 36.7 05/21/2011   PLT 379 06/24/2015   PLT 307 05/21/2011   LYMPHOPCT 23 06/22/2015   LYMPHOPCT 38.7 05/21/2011   MONOPCT 6 06/22/2015   MONOPCT 7.4 05/21/2011   EOSPCT 4 06/22/2015   EOSPCT 5.8 05/21/2011   BASOPCT 0 06/22/2015   BASOPCT 0.7 05/21/2011     CMP:  Lab Results  Component Value Date   NA 138 06/23/2015   K 4.6 06/24/2015   CL 105 06/23/2015   CO2 26 06/23/2015   BUN 7 06/23/2015   CREATININE 0.45 06/23/2015   CREATININE 0.68 12/20/2014   PROT 7.0 06/13/2015   ALBUMIN 1.9* 06/13/2015   BILITOT 0.3 06/13/2015   ALKPHOS 93 06/13/2015   AST 21 06/13/2015   ALT 11* 06/13/2015  . Lab Results  Component Value Date   HGBA1C 11.7* 06/01/2015    CBG (last 3)   Recent Labs  06/23/15 1753 06/23/15 2129 06/24/15 0753  GLUCAP 115* 208* 228*      Total Time in preparing paper work, data evaluation and todays exam - 35 minutes  Thurnell Lose M.D on 06/24/2015 at 8:48 AM  [facility]   Office  [phone]

## 2015-06-24 NOTE — Consult Note (Addendum)
WOC consult requested for left leg BKA amputation site; pt had surgery on 7/9 by Dr Erlinda Hong according to the EMR.  Please consult ortho service for further plan of care regarding the post-op site.   Please re-consult if further assistance is needed.  Thank-you,  Julien Girt MSN, [place], [place], [place], [place]

## 2015-06-25 ENCOUNTER — Inpatient Hospital Stay: Payer: Self-pay | Admitting: Internal Medicine

## 2015-06-26 ENCOUNTER — Inpatient Hospital Stay (HOSPITAL_COMMUNITY)
Admission: EM | Admit: 2015-06-26 | Discharge: 2015-06-28 | DRG: 464 | Disposition: A | Discharge status: Home Health | Payer: Self-pay | Attending: Orthopaedic Surgery | Admitting: Orthopaedic Surgery

## 2015-06-26 ENCOUNTER — Emergency Department (HOSPITAL_COMMUNITY): Payer: Self-pay | Admitting: Anesthesiology

## 2015-06-26 ENCOUNTER — Emergency Department (HOSPITAL_COMMUNITY): Payer: Self-pay

## 2015-06-26 ENCOUNTER — Emergency Department (HOSPITAL_COMMUNITY): Payer: No Typology Code available for payment source | Admitting: Anesthesiology

## 2015-06-26 ENCOUNTER — Encounter (HOSPITAL_COMMUNITY)
Admission: EM | Disposition: A | Discharge status: Home Health | Payer: Self-pay | Source: Home / Self Care | Attending: Orthopaedic Surgery

## 2015-06-26 ENCOUNTER — Encounter (HOSPITAL_COMMUNITY): Payer: Self-pay | Admitting: *Deleted

## 2015-06-26 DIAGNOSIS — Z923 Personal history of irradiation: Secondary | ICD-10-CM

## 2015-06-26 DIAGNOSIS — Z01811 Encounter for preprocedural respiratory examination: Secondary | ICD-10-CM

## 2015-06-26 DIAGNOSIS — T8131XA Disruption of external operation (surgical) wound, not elsewhere classified, initial encounter: Secondary | ICD-10-CM

## 2015-06-26 DIAGNOSIS — Z79899 Other long term (current) drug therapy: Secondary | ICD-10-CM

## 2015-06-26 DIAGNOSIS — C7911 Secondary malignant neoplasm of bladder: Secondary | ICD-10-CM | POA: Diagnosis present

## 2015-06-26 DIAGNOSIS — T8130XA Disruption of wound, unspecified, initial encounter: Secondary | ICD-10-CM | POA: Diagnosis present

## 2015-06-26 DIAGNOSIS — W1830XA Fall on same level, unspecified, initial encounter: Secondary | ICD-10-CM | POA: Diagnosis present

## 2015-06-26 DIAGNOSIS — Y92009 Unspecified place in unspecified non-institutional (private) residence as the place of occurrence of the external cause: Secondary | ICD-10-CM

## 2015-06-26 DIAGNOSIS — T8781 Dehiscence of amputation stump: Principal | ICD-10-CM | POA: Diagnosis present

## 2015-06-26 DIAGNOSIS — Z89512 Acquired absence of left leg below knee: Secondary | ICD-10-CM

## 2015-06-26 DIAGNOSIS — D649 Anemia, unspecified: Secondary | ICD-10-CM | POA: Diagnosis present

## 2015-06-26 DIAGNOSIS — Z794 Long term (current) use of insulin: Secondary | ICD-10-CM

## 2015-06-26 DIAGNOSIS — C52 Malignant neoplasm of vagina: Secondary | ICD-10-CM | POA: Diagnosis present

## 2015-06-26 DIAGNOSIS — Z833 Family history of diabetes mellitus: Secondary | ICD-10-CM

## 2015-06-26 DIAGNOSIS — E119 Type 2 diabetes mellitus without complications: Secondary | ICD-10-CM | POA: Diagnosis present

## 2015-06-26 DIAGNOSIS — W19XXXA Unspecified fall, initial encounter: Secondary | ICD-10-CM

## 2015-06-26 HISTORY — PX: AMPUTATION: SHX166

## 2015-06-26 LAB — CBC WITH DIFFERENTIAL/PLATELET
BASOS ABS: 0 10*3/uL (ref 0.0–0.1)
BASOS PCT: 0 % (ref 0–1)
EOS ABS: 0.5 10*3/uL (ref 0.0–0.7)
Eosinophils Relative: 7 % — ABNORMAL HIGH (ref 0–5)
HEMATOCRIT: 29.2 % — AB (ref 36.0–46.0)
HEMOGLOBIN: 9.3 g/dL — AB (ref 12.0–15.0)
LYMPHS PCT: 25 % (ref 12–46)
Lymphs Abs: 1.7 10*3/uL (ref 0.7–4.0)
MCH: 29.1 pg (ref 26.0–34.0)
MCHC: 31.8 g/dL (ref 30.0–36.0)
MCV: 91.3 fL (ref 78.0–100.0)
Monocytes Absolute: 0.4 10*3/uL (ref 0.1–1.0)
Monocytes Relative: 5 % (ref 3–12)
Neutro Abs: 4.4 10*3/uL (ref 1.7–7.7)
Neutrophils Relative %: 63 % (ref 43–77)
PLATELETS: 395 10*3/uL (ref 150–400)
RBC: 3.2 MIL/uL — AB (ref 3.87–5.11)
RDW: 15 % (ref 11.5–15.5)
WBC: 7.1 10*3/uL (ref 4.0–10.5)

## 2015-06-26 LAB — COMPREHENSIVE METABOLIC PANEL
ALT: 8 U/L — AB (ref 14–54)
AST: 17 U/L (ref 15–41)
Albumin: 2.8 g/dL — ABNORMAL LOW (ref 3.5–5.0)
Alkaline Phosphatase: 124 U/L (ref 38–126)
Anion gap: 7 (ref 5–15)
BUN: 13 mg/dL (ref 6–20)
CALCIUM: 8.4 mg/dL — AB (ref 8.9–10.3)
CO2: 26 mmol/L (ref 22–32)
Chloride: 103 mmol/L (ref 101–111)
Creatinine, Ser: 0.71 mg/dL (ref 0.44–1.00)
GFR calc non Af Amer: >60 mL/min (ref >60)
Glucose, Bld: 219 mg/dL — ABNORMAL HIGH (ref 65–99)
Potassium: 4 mmol/L (ref 3.5–5.1)
Sodium: 136 mmol/L (ref 135–145)
Total Bilirubin: 0.3 mg/dL (ref 0.3–1.2)
Total Protein: 7.5 g/dL (ref 6.5–8.1)

## 2015-06-26 LAB — GLUCOSE, CAPILLARY
GLUCOSE-CAPILLARY: 193 mg/dL — AB (ref 65–99)
Glucose-Capillary: 143 mg/dL — ABNORMAL HIGH (ref 65–99)

## 2015-06-26 LAB — I-STAT CG4 LACTIC ACID, ED: Lactic Acid, Venous: 1.77 mmol/L (ref 0.5–2.0)

## 2015-06-26 SURGERY — AMPUTATION BELOW KNEE
Anesthesia: General | Site: Leg Lower | Laterality: Left

## 2015-06-26 MED ORDER — METOCLOPRAMIDE HCL 5 MG/ML IJ SOLN: 5.0000 mg | Freq: Three times a day (TID) | INTRAMUSCULAR | Status: DC | PRN

## 2015-06-26 MED ORDER — 0.9 % SODIUM CHLORIDE (POUR BTL) OPTIME
TOPICAL | Status: DC | PRN
  Administered 2015-06-26: 1000 mL

## 2015-06-26 MED ORDER — ACETAMINOPHEN 650 MG RE SUPP: 650.0000 mg | Freq: Four times a day (QID) | RECTAL | Status: DC | PRN

## 2015-06-26 MED ORDER — INSULIN ASPART 100 UNIT/ML ~~LOC~~ SOLN
0.0000 [IU] | Freq: Three times a day (TID) | SUBCUTANEOUS | Status: DC
  Administered 2015-06-27: 3 [IU] via SUBCUTANEOUS
  Administered 2015-06-27 (×2): 5 [IU] via SUBCUTANEOUS
  Administered 2015-06-28 (×2): 3 [IU] via SUBCUTANEOUS

## 2015-06-26 MED ORDER — FENTANYL CITRATE (PF) 250 MCG/5ML IJ SOLN
INTRAMUSCULAR | Status: AC
  Filled 2015-06-26: qty 5

## 2015-06-26 MED ORDER — INSULIN GLARGINE 100 UNIT/ML ~~LOC~~ SOLN
15.0000 [IU] | Freq: Two times a day (BID) | SUBCUTANEOUS | Status: DC
  Administered 2015-06-26 – 2015-06-28 (×4): 15 [IU] via SUBCUTANEOUS
  Filled 2015-06-26 (×7): qty 0.15

## 2015-06-26 MED ORDER — POLYSACCHARIDE IRON COMPLEX 150 MG PO CAPS
150.0000 mg | ORAL_CAPSULE | Freq: Two times a day (BID) | ORAL | Status: DC
  Administered 2015-06-27 – 2015-06-28 (×3): 150 mg via ORAL
  Filled 2015-06-26 (×5): qty 1

## 2015-06-26 MED ORDER — ONDANSETRON HCL 4 MG PO TABS
4.0000 mg | ORAL_TABLET | Freq: Four times a day (QID) | ORAL | Status: DC | PRN
Start: 2015-06-26 — End: 2015-06-28

## 2015-06-26 MED ORDER — HYDROMORPHONE HCL 1 MG/ML IJ SOLN: 0.5000 mg | INTRAMUSCULAR | Status: DC | PRN

## 2015-06-26 MED ORDER — INSULIN ASPART 100 UNIT/ML ~~LOC~~ SOLN: 10.0000 [IU] | Freq: Three times a day (TID) | SUBCUTANEOUS | Status: DC

## 2015-06-26 MED ORDER — LACTATED RINGERS IV SOLN
INTRAVENOUS | Status: DC | PRN
  Administered 2015-06-26 (×2): via INTRAVENOUS

## 2015-06-26 MED ORDER — CEFAZOLIN SODIUM-DEXTROSE 2-3 GM-% IV SOLR
2.0000 g | Freq: Once | INTRAVENOUS | Status: AC
  Administered 2015-06-26: 2 g via INTRAVENOUS

## 2015-06-26 MED ORDER — INSULIN GLARGINE 100 UNIT/ML SOLOSTAR PEN: 15.0000 [IU] | PEN_INJECTOR | Freq: Two times a day (BID) | SUBCUTANEOUS | Status: DC

## 2015-06-26 MED ORDER — SIMETHICONE 80 MG PO CHEW: 80.0000 mg | CHEWABLE_TABLET | Freq: Four times a day (QID) | ORAL | Status: DC | PRN

## 2015-06-26 MED ORDER — PROPOFOL 10 MG/ML IV BOLUS
INTRAVENOUS | Status: DC | PRN
  Administered 2015-06-26: 130 mg via INTRAVENOUS

## 2015-06-26 MED ORDER — INSULIN PEN NEEDLE 32G X 4 MM MISC: 1.0000 "application " | Freq: Three times a day (TID) | Status: DC

## 2015-06-26 MED ORDER — OXYCODONE HCL 5 MG PO TABS
5.0000 mg | ORAL_TABLET | Freq: Four times a day (QID) | ORAL | Status: DC | PRN
  Administered 2015-06-27: 10 mg via ORAL
  Filled 2015-06-26 (×3): qty 2

## 2015-06-26 MED ORDER — BISACODYL 10 MG RE SUPP: 10.0000 mg | Freq: Every day | RECTAL | Status: DC | PRN

## 2015-06-26 MED ORDER — SODIUM CHLORIDE 0.9 % IV SOLN
INTRAVENOUS | Status: DC
  Administered 2015-06-27: 01:00:00 via INTRAVENOUS

## 2015-06-26 MED ORDER — ONDANSETRON HCL 4 MG/2ML IJ SOLN
INTRAMUSCULAR | Status: DC | PRN
  Administered 2015-06-26: 4 mg via INTRAVENOUS

## 2015-06-26 MED ORDER — MIDAZOLAM HCL 2 MG/2ML IJ SOLN
INTRAMUSCULAR | Status: AC
  Filled 2015-06-26: qty 2

## 2015-06-26 MED ORDER — SODIUM CHLORIDE 0.9 % IJ SOLN: 10.0000 mL | INTRAMUSCULAR | Status: DC | PRN

## 2015-06-26 MED ORDER — CEFAZOLIN SODIUM 1-5 GM-% IV SOLN
1.0000 g | Freq: Three times a day (TID) | INTRAVENOUS | Status: AC
  Administered 2015-06-27 – 2015-06-28 (×6): 1 g via INTRAVENOUS
  Filled 2015-06-26 (×6): qty 50

## 2015-06-26 MED ORDER — METOCLOPRAMIDE HCL 5 MG PO TABS: 5.0000 mg | ORAL_TABLET | Freq: Three times a day (TID) | ORAL | Status: DC | PRN

## 2015-06-26 MED ORDER — INSULIN ASPART 100 UNIT/ML FLEXPEN: 10.0000 [IU] | PEN_INJECTOR | Freq: Three times a day (TID) | SUBCUTANEOUS | Status: DC

## 2015-06-26 MED ORDER — LACTATED RINGERS IV SOLN
INTRAVENOUS | Status: DC
  Administered 2015-06-26: 17:00:00 via INTRAVENOUS

## 2015-06-26 MED ORDER — ONDANSETRON HCL 4 MG/2ML IJ SOLN
4.0000 mg | Freq: Four times a day (QID) | INTRAMUSCULAR | Status: DC | PRN
  Administered 2015-06-27: 4 mg via INTRAVENOUS
  Filled 2015-06-26: qty 2

## 2015-06-26 MED ORDER — PHENYLEPHRINE HCL 10 MG/ML IJ SOLN
10.0000 mg | INTRAVENOUS | Status: DC | PRN
  Administered 2015-06-26: 10 ug/min via INTRAVENOUS

## 2015-06-26 MED ORDER — ACETAMINOPHEN 325 MG PO TABS: 650.0000 mg | ORAL_TABLET | Freq: Four times a day (QID) | ORAL | Status: DC | PRN

## 2015-06-26 MED ORDER — FENTANYL CITRATE (PF) 100 MCG/2ML IJ SOLN
INTRAMUSCULAR | Status: DC | PRN
  Administered 2015-06-26: 50 ug via INTRAVENOUS

## 2015-06-26 MED ORDER — DOCUSATE SODIUM 100 MG PO CAPS
100.0000 mg | ORAL_CAPSULE | Freq: Two times a day (BID) | ORAL | Status: DC
Start: 2015-06-26 — End: 2015-06-28
  Administered 2015-06-26 – 2015-06-27 (×3): 100 mg via ORAL
  Filled 2015-06-26 (×4): qty 1

## 2015-06-26 MED ORDER — CALCIUM POLYCARBOPHIL 625 MG PO TABS
625.0000 mg | ORAL_TABLET | Freq: Two times a day (BID) | ORAL | Status: DC
  Administered 2015-06-27 – 2015-06-28 (×3): 625 mg via ORAL
  Filled 2015-06-26 (×5): qty 1

## 2015-06-26 MED ORDER — CEFAZOLIN SODIUM-DEXTROSE 2-3 GM-% IV SOLR
INTRAVENOUS | Status: AC
  Filled 2015-06-26: qty 50

## 2015-06-26 SURGICAL SUPPLY — 57 items
BANDAGE ELASTIC 4 VELCRO ST LF (GAUZE/BANDAGES/DRESSINGS) ×3 IMPLANT
BANDAGE ELASTIC 6 VELCRO ST LF (GAUZE/BANDAGES/DRESSINGS) ×1 IMPLANT
BANDAGE ESMARK 6X9 LF (GAUZE/BANDAGES/DRESSINGS) IMPLANT
BLADE SAW SAG 29X58X.64 (BLADE) ×3 IMPLANT
BNDG CMPR 9X6 STRL LF SNTH (GAUZE/BANDAGES/DRESSINGS) ×1
BNDG COHESIVE 6X5 TAN STRL LF (GAUZE/BANDAGES/DRESSINGS) ×3 IMPLANT
BNDG ESMARK 6X9 LF (GAUZE/BANDAGES/DRESSINGS) ×3
BNDG GAUZE ELAST 4 BULKY (GAUZE/BANDAGES/DRESSINGS) ×4 IMPLANT
CANISTER SUCTION 2500CC (MISCELLANEOUS) ×2 IMPLANT
CLEANER TIP ELECTROSURG 2X2 (MISCELLANEOUS) ×3 IMPLANT
COVER SURGICAL LIGHT HANDLE (MISCELLANEOUS) ×3 IMPLANT
CUFF TOURNIQUET SINGLE 34IN LL (TOURNIQUET CUFF) ×2 IMPLANT
CUFF TOURNIQUET SINGLE 44IN (TOURNIQUET CUFF) IMPLANT
DRAIN PENROSE 1/2X12 LTX STRL (WOUND CARE) IMPLANT
DRAPE EXTREMITY T 121X128X90 (DRAPE) ×3 IMPLANT
DRAPE IMP U-DRAPE 54X76 (DRAPES) ×2 IMPLANT
DRAPE INCISE IOBAN 66X45 STRL (DRAPES) ×3 IMPLANT
DRAPE ORTHO SPLIT 77X108 STRL (DRAPES) ×3
DRAPE PROXIMA HALF (DRAPES) ×6 IMPLANT
DRAPE SURG ORHT 6 SPLT 77X108 (DRAPES) ×1 IMPLANT
DRAPE U-SHAPE 47X51 STRL (DRAPES) ×3 IMPLANT
DRSG PAD ABDOMINAL 8X10 ST (GAUZE/BANDAGES/DRESSINGS) ×2 IMPLANT
DURAPREP 26ML APPLICATOR (WOUND CARE) ×3 IMPLANT
EVACUATOR 1/8 PVC DRAIN (DRAIN) IMPLANT
GAUZE SPONGE 4X4 12PLY STRL (GAUZE/BANDAGES/DRESSINGS) ×3 IMPLANT
GAUZE XEROFORM 5X9 LF (GAUZE/BANDAGES/DRESSINGS) ×3 IMPLANT
GLOVE BIOGEL PI IND STRL 8 (GLOVE) ×2 IMPLANT
GLOVE BIOGEL PI INDICATOR 8 (GLOVE) ×4
GLOVE ORTHO TXT STRL SZ7.5 (GLOVE) ×6 IMPLANT
GOWN STRL REUS W/ TWL LRG LVL3 (GOWN DISPOSABLE) ×1 IMPLANT
GOWN STRL REUS W/ TWL XL LVL3 (GOWN DISPOSABLE) ×1 IMPLANT
GOWN STRL REUS W/TWL 2XL LVL3 (GOWN DISPOSABLE) ×3 IMPLANT
GOWN STRL REUS W/TWL LRG LVL3 (GOWN DISPOSABLE) ×3
GOWN STRL REUS W/TWL XL LVL3 (GOWN DISPOSABLE) ×3
IMMOBILIZER KNEE 22 (SOFTGOODS) ×2 IMPLANT
KIT BASIN OR (CUSTOM PROCEDURE TRAY) ×3 IMPLANT
KIT ROOM TURNOVER OR (KITS) ×3 IMPLANT
MANIFOLD NEPTUNE II (INSTRUMENTS) ×1 IMPLANT
NDL MAYO TROCAR (NEEDLE) ×1 IMPLANT
NEEDLE MAYO TROCAR (NEEDLE) ×3 IMPLANT
NS IRRIG 1000ML POUR BTL (IV SOLUTION) ×3 IMPLANT
PACK GENERAL/GYN (CUSTOM PROCEDURE TRAY) ×3 IMPLANT
PAD ARMBOARD 7.5X6 YLW CONV (MISCELLANEOUS) ×6 IMPLANT
SPONGE LAP 18X18 X RAY DECT (DISPOSABLE) IMPLANT
STAPLER VISISTAT 35W (STAPLE) ×3 IMPLANT
STOCKINETTE IMPERVIOUS LG (DRAPES) IMPLANT
STOCKINETTE TUBULAR COTT 4X25 (GAUZE/BANDAGES/DRESSINGS) ×2 IMPLANT
SUT ETHILON 2 0 PSLX (SUTURE) ×6 IMPLANT
SUT SILK 3 0 SH CR/8 (SUTURE) ×1 IMPLANT
SUT VIC AB 0 CT1 27 (SUTURE) ×6
SUT VIC AB 0 CT1 27XBRD ANBCTR (SUTURE) ×1 IMPLANT
SUT VIC AB 1 CTX 18 (SUTURE) ×1 IMPLANT
SUT VICRYL 0 TIES 12 18 (SUTURE) ×1 IMPLANT
SUT VICRYL AB 2 0 TIES (SUTURE) ×1 IMPLANT
TOWEL OR 17X24 6PK STRL BLUE (TOWEL DISPOSABLE) ×3 IMPLANT
TOWEL OR 17X26 10 PK STRL BLUE (TOWEL DISPOSABLE) ×3 IMPLANT
WATER STERILE IRR 1000ML POUR (IV SOLUTION) ×1 IMPLANT

## 2015-06-26 NOTE — H&P (Signed)
[name] is an 49 y.o. female.   Chief Complaint: left bka wound dehiscence HPI:  Patient is a couple of weeks s/p left bka by dr Erlinda Hong. Was seen in the clinic by him yesterday and had staple removal.  Patient states that she went home after appt and a short while after she fell onto her stump and wound opened. Seen in the ED and transported up to OR today for wound I&D and wound closure.   Past Medical History  Diagnosis Date  . Diabetes mellitus   . 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 Janie Morning), Cysto performed by Dr. Jonna Munro 12/2009 with evac of clots and bx and fulguration. // H/O stage 2 SCC of the vulva  . Anemia     2/2 blood loss  . Diabetes mellitus type 2, uncontrolled DX: 2001  . 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 Diabetic foot ulcers   . Cataract   . Diabetes mellitus without complication     Past Surgical History  Procedure Laterality Date  . Radical wide local excision of the vulva and right nguinal lymph node dissection  10/2003    Dr. Fermin Schwab  . Uterine dilatation and currettage    . Labial mass excision  07/2003    Dr. Ree Edman  . Left second toe mtp joint amputation  12/19/2010    Dr. Mayer Camel  . Irrigation and debridement of left foot with removal of left  01/29/2011    Dr. Mayer Camel  . Tubal ligation    . I&d extremity Left 06/02/2015    Procedure: IRRIGATION AND DEBRIDEMENT EXTREMITY;  Surgeon: Leandrew Koyanagi, MD;  Location: [facility];  Service: Orthopedics;  Laterality: Left;  . Amputation Left 06/02/2015    Procedure: Partial Left  AMPUTATION FOOT;  Surgeon: Leandrew Koyanagi, MD;  Location: [facility];  Service: Orthopedics;  Laterality: Left;  . I&d extremity Left 06/05/2015    Procedure: IRRIGATION AND DEBRIDEMENT LEFT LEG, POSSIBLE  CLOSURE BELOW KNEE AMPUTATION;  Surgeon: Leandrew Koyanagi, MD;  Location: [place];  Service: Orthopedics;  Laterality: Left;  . Amputation Left 06/03/2015    Procedure: Left Transtibial amputation,application of wound vac;  Surgeon: Leandrew Koyanagi, MD;  Location: [facility];  Service: Orthopedics;  Laterality: Left;  . I&d extremity Left 06/08/2015    Procedure: IRRIGATION AND DEBRIDEMENT LEFT LEG, WITH  CLOSURE OF BKA;  Surgeon: Leandrew Koyanagi, MD;  Location: [place];  Service: Orthopedics;  Laterality: Left;  . Tee without cardioversion N/A 06/11/2015    Procedure: TRANSESOPHAGEAL ECHOCARDIOGRAM (TEE);  Surgeon: Lelon Perla, MD;  Location: [facility];  Service: Cardiovascular;  Laterality: N/A;    Family History  Problem Relation Age of Onset  . Diabetes Mother   . Diabetes Father   . Diabetes Brother    Social History:  reports that she has never smoked. She has never used smokeless tobacco. She reports that she does not drink alcohol or use illicit drugs.  Allergies: No Known Allergies  Medications Prior to Admission  Medication Sig Dispense Refill  . acetaminophen (TYLENOL) 325 MG tablet Take 2 tablets (650 mg total) by mouth every 6 (six) hours as needed for mild pain or moderate pain. 100 tablet 0  . Blood Glucose  Monitoring Suppl (RELION CONFIRM GLUCOSE MONITOR) W/DEVICE KIT 1 kit by Does not apply route 3 (three) times daily. 1 kit 2  . ceFAZolin (ANCEF) 2-3 GM-% SOLR Inject 50 mLs (2 g total) into the vein every 8 (eight) hours. 42 each 0  . glucose monitoring kit (FREESTYLE) monitoring kit 1 each by Does not apply route 4 (four) times daily - after meals and at bedtime. 1 month Diabetic Testing Supplies for QAC-QHS accuchecks. 1 each 1  . insulin aspart (NOVOLOG FLEXPEN) 100 UNIT/ML FlexPen Inject 10 Units into the skin 3 (three) times daily with meals. 15 mL 1  . Insulin Glargine (LANTUS SOLOSTAR) 100 UNIT/ML Solostar Pen Inject 15 Units into the skin 2 (two) times daily. 15 mL 1  . Insulin  Pen Needle 32G X 4 MM MISC 1 application by Does not apply route 4 (four) times daily -  with meals and at bedtime. 100 each 1  . iron polysaccharides (NIFEREX) 150 MG capsule Take 1 capsule (150 mg total) by mouth 2 times daily at 12 noon and 4 pm. 60 capsule 1  . Multiple Vitamin (MULTIVITAMIN WITH MINERALS) TABS tablet Take 1 tablet by mouth daily. 30 tablet 1  . polycarbophil (FIBERCON) 625 MG tablet Take 1 tablet (625 mg total) by mouth 2 (two) times daily. 60 tablet 1  . simethicone (MYLICON) 80 MG chewable tablet Chew 1 tablet (80 mg total) by mouth 4 (four) times daily as needed for flatulence (for gas and bloating). 30 tablet 0  . traMADol (ULTRAM) 50 MG tablet Take 0.5-1 tablets (25-50 mg total) by mouth every 6 (six) hours as needed for severe pain. (Patient not taking: Reported on 06/26/2015) 30 tablet 0    Results for orders placed or performed during the hospital encounter of 06/26/15 (from the past 48 hour(s))  I-Stat CG4 Lactic Acid, ED (Not at [facility])     Status: None   Collection Time: 06/26/15 12:24 PM  Result Value Ref Range   Lactic Acid, Venous 1.77 0.5 - 2.0 mmol/L  Comprehensive metabolic panel     Status: Abnormal   Collection Time: 06/26/15 12:32 PM  Result Value Ref Range   Sodium 136 135 - 145 mmol/L   Potassium 4.0 3.5 - 5.1 mmol/L   Chloride 103 101 - 111 mmol/L   CO2 26 22 - 32 mmol/L   Glucose, Bld 219 (H) 65 - 99 mg/dL   BUN 13 6 - 20 mg/dL   Creatinine, Ser 0.71 0.44 - 1.00 mg/dL   Calcium 8.4 (L) 8.9 - 10.3 mg/dL   Total Protein 7.5 6.5 - 8.1 g/dL   Albumin 2.8 (L) 3.5 - 5.0 g/dL   AST 17 15 - 41 U/L   ALT 8 (L) 14 - 54 U/L   Alkaline Phosphatase 124 38 - 126 U/L   Total Bilirubin 0.3 0.3 - 1.2 mg/dL   GFR calc non Af Amer >60 >60 mL/min   GFR calc Af Amer >60 >60 mL/min    Comment: (NOTE) The eGFR has been calculated using the CKD EPI equation. This calculation has not been validated in all clinical situations. eGFR's persistently <60 mL/min signify  possible Chronic Kidney Disease.    Anion gap 7 5 - 15  CBC with Differential     Status: Abnormal   Collection Time: 06/26/15 12:32 PM  Result Value Ref Range   WBC 7.1 4.0 - 10.5 K/uL   RBC 3.20 (L) 3.87 - 5.11 MIL/uL   Hemoglobin 9.3 (L) 12.0 -  15.0 g/dL   HCT 29.2 (L) 36.0 - 46.0 %   MCV 91.3 78.0 - 100.0 fL   MCH 29.1 26.0 - 34.0 pg   MCHC 31.8 30.0 - 36.0 g/dL   RDW 15.0 11.5 - 15.5 %   Platelets 395 150 - 400 K/uL   Neutrophils Relative % 63 43 - 77 %   Neutro Abs 4.4 1.7 - 7.7 K/uL   Lymphocytes Relative 25 12 - 46 %   Lymphs Abs 1.7 0.7 - 4.0 K/uL   Monocytes Relative 5 3 - 12 %   Monocytes Absolute 0.4 0.1 - 1.0 K/uL   Eosinophils Relative 7 (H) 0 - 5 %   Eosinophils Absolute 0.5 0.0 - 0.7 K/uL   Basophils Relative 0 0 - 1 %   Basophils Absolute 0.0 0.0 - 0.1 K/uL   Dg Chest 2 View  06/26/2015   CLINICAL DATA:  Preop for incision and drainage of left leg wound.  EXAM: CHEST  2 VIEW  COMPARISON:  June 04, 2015.  FINDINGS: The heart size and mediastinal contours are within normal limits. Both lungs are clear. No pneumothorax or pleural effusion is noted. Left-sided PICC line is noted with distal tip in expected position of cavoatrial junction. The visualized skeletal structures are unremarkable.  IMPRESSION: Left-sided PICC line is noted with distal tip in expected position of cavoatrial junction. No acute cardiopulmonary abnormality seen.   Electronically Signed   By: Marijo Conception, M.D.   On: 06/26/2015 15:45   Dg Knee 1-2 Views Left  06/26/2015   CLINICAL DATA:  Open wound at below the knee amputation site  EXAM: LEFT KNEE - 1-2 VIEW  COMPARISON:  None.  FINDINGS: There are changes consistent with a below-knee amputation. There is a mottled appearance to the overlying soft tissues at the amputation site as well as gas along the lateral aspect of the knee joint consistent with localized infection.  IMPRESSION: Open wound with evidence of gas within the soft tissues laterally  adjacent to the knee joint. This would be consistent with further soft tissue infection   Electronically Signed   By: Inez Catalina M.D.   On: 06/26/2015 15:45    Review of Systems  Constitutional: Negative.   HENT: Negative.   Eyes: Negative.   Respiratory: Negative.   Cardiovascular: Negative.   Skin: Negative.   Neurological: Negative.   Psychiatric/Behavioral: Negative.     Blood pressure 100/61, pulse 91, temperature 98.6 F (37 C), temperature source Oral, resp. rate 17, height 5' 8"  (1.727 m), last menstrual period 05/12/2013, SpO2 100 %. Physical Exam  Constitutional: She is oriented to person, place, and time. She appears well-developed.  HENT:  Head: Normocephalic and atraumatic.  Eyes: EOM are normal.  Neck: Normal range of motion.  Respiratory: No respiratory distress.  GI: She exhibits no distension.  Musculoskeletal:  Left stump wound open.  Exposed bone.  Some bleeding.    Neurological: She is alert and oriented to person, place, and time.  Psychiatric: She has a normal mood and affect.     Assessment/Plan Left BKA wound dehiscence.  OR today for I&D and wound closure.  Procedure discussed and all questions answered.   Briellah Baik M 06/26/2015, 4:41 PM

## 2015-06-26 NOTE — Anesthesia Procedure Notes (Signed)
Procedure Name: LMA Insertion Date/Time: 06/26/2015 5:14 PM Performed by: Izora Gala Pre-anesthesia Checklist: Patient identified, Emergency Drugs available, Suction available and Patient being monitored Patient Re-evaluated:Patient Re-evaluated prior to inductionOxygen Delivery Method: Circle system utilized Preoxygenation: Pre-oxygenation with 100% oxygen Intubation Type: IV induction Ventilation: Mask ventilation without difficulty LMA: LMA inserted LMA Size: 3.0 Number of attempts: 1 Placement Confirmation: positive ETCO2 Tube secured with: Tape

## 2015-06-26 NOTE — Interval H&P Note (Signed)
History and Physical Interval Note:  06/26/2015 5:07 PM  [name]  has presented today for surgery, with the diagnosis of [place]  The various methods of treatment have been discussed with the patient and family. After consideration of risks, benefits and other options for treatment, the patient has consented to  Procedure(s): AMPUTATION BELOW KNEE STUMP RECLOSURE  (Left) as a surgical intervention .  The patient's history has been reviewed, patient examined, no change in status, stable for surgery.  I have reviewed the patient's chart and labs.  Questions were answered to the patient's satisfaction.     Malik Paar C

## 2015-06-26 NOTE — Anesthesia Postprocedure Evaluation (Signed)
  Anesthesia Post-op Note  Patient: [name]  Procedure(s) Performed: Procedure(s): AMPUTATION BELOW KNEE STUMP RECLOSURE  (Left)  Patient Location: PACU  Anesthesia Type:General  Level of Consciousness: awake, alert , oriented and patient cooperative  Airway and Oxygen Therapy: Patient Spontanous Breathing and Patient connected to nasal cannula oxygen  Post-op Pain: none  Post-op Assessment: Post-op Vital signs reviewed, Patient's Cardiovascular Status Stable, Respiratory Function Stable, Patent Airway, No signs of Nausea or vomiting and Pain level controlled              Post-op Vital Signs: Reviewed and stable  Last Vitals:  Filed Vitals:   06/26/15 1915  BP:   Pulse: 88  Temp:   Resp: 14    Complications: No apparent anesthesia complications

## 2015-06-26 NOTE — ED Notes (Signed)
Pt states her BKA site dehisced yesterday and is now having drainage.

## 2015-06-26 NOTE — ED Notes (Signed)
Pt reports left bka 7/2 and still has open wound and infection. Denies fever.

## 2015-06-26 NOTE — ED Notes (Signed)
Patient transported to X-ray 

## 2015-06-26 NOTE — Anesthesia Preprocedure Evaluation (Addendum)
Anesthesia Evaluation  Patient identified by MRN, date of birth, ID band Patient unresponsive    Reviewed: Allergy & Precautions, NPO status , Patient's Chart, lab work & pertinent test results  History of Anesthesia Complications Negative for: history of anesthetic complications  Airway Mallampati: I  TM Distance: >3 FB Neck ROM: Full    Dental no notable dental hx.    Pulmonary neg pulmonary ROS,  Patient intubated breath sounds clear to auscultation  Pulmonary exam normal       Cardiovascular Normal cardiovascular examRhythm:Regular Rate:Normal     Neuro/Psych negative neurological ROS  negative psych ROS   GI/Hepatic negative GI ROS, Neg liver ROS,   Endo/Other  diabetes, Type 2, Insulin Dependent  Renal/GU      Musculoskeletal   Abdominal   Peds  Hematology  (+) anemia ,   Anesthesia Other Findings   Reproductive/Obstetrics negative OB ROS                            Anesthesia Physical Anesthesia Plan  ASA: III  Anesthesia Plan: General   Post-op Pain Management:    Induction: Intravenous  Airway Management Planned: LMA  Additional Equipment:   Intra-op Plan:   Post-operative Plan: Extubation in OR  Informed Consent: I have reviewed the patients History and Physical, chart, labs and discussed the procedure including the risks, benefits and alternatives for the proposed anesthesia with the patient or authorized representative who has indicated his/her understanding and acceptance.   Dental advisory given  Plan Discussed with: CRNA and Surgeon  Anesthesia Plan Comments:         Anesthesia Quick Evaluation

## 2015-06-26 NOTE — ED Provider Notes (Signed)
CSN: [id number]     Arrival date & time 06/26/15  1149 History   First MD Initiated Contact with Patient 06/26/15 1405     Chief Complaint  Patient presents with  . Wound Infection     (Consider location/radiation/quality/duration/timing/severity/associated sxs/prior Treatment) HPI Comments: Patient is a 49 year old female with a past medical history of vulvar cancer and BKA of left lower extremity by Dr. Erlinda Hong on June 05, 2015 who presents with open wound that occurred last night. Patient reports falling yesterday at home after her Orthopedic follow up appointment when she noticed the surgical wound opened. She reports mild aching pain without radiation. No other symptoms or injury.    Past Medical History  Diagnosis Date  . Diabetes mellitus   . 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 Janie Morning), Cysto performed by Dr. Jonna Munro 12/2009 with evac of clots and bx and fulguration. // H/O stage 2 SCC of the vulva  . Anemia     2/2 blood loss  . Diabetes mellitus type 2, uncontrolled DX: 2001  . 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 Diabetic foot ulcers   . Cataract   . Diabetes mellitus without complication    Past Surgical History  Procedure Laterality Date  . Radical wide local excision of the vulva and right nguinal lymph node dissection  10/2003    Dr. Fermin Schwab  . Uterine dilatation and currettage    . Labial mass excision  07/2003    Dr. Ree Edman  . Left second toe mtp joint amputation  12/19/2010    Dr. Mayer Camel  . Irrigation and debridement of left foot with removal of left  01/29/2011    Dr. Mayer Camel  . Tubal ligation    . I&d extremity Left 06/02/2015    Procedure: IRRIGATION AND DEBRIDEMENT EXTREMITY;  Surgeon: Leandrew Koyanagi, MD;  Location: [facility];   Service: Orthopedics;  Laterality: Left;  . Amputation Left 06/02/2015    Procedure: Partial Left  AMPUTATION FOOT;  Surgeon: Leandrew Koyanagi, MD;  Location: [facility];  Service: Orthopedics;  Laterality: Left;  . I&d extremity Left 06/05/2015    Procedure: IRRIGATION AND DEBRIDEMENT LEFT LEG, POSSIBLE CLOSURE BELOW KNEE AMPUTATION;  Surgeon: Leandrew Koyanagi, MD;  Location: [place];  Service: Orthopedics;  Laterality: Left;  . Amputation Left 06/03/2015    Procedure: Left Transtibial amputation,application of wound vac;  Surgeon: Leandrew Koyanagi, MD;  Location: [facility];  Service: Orthopedics;  Laterality: Left;  . I&d extremity Left 06/08/2015    Procedure: IRRIGATION AND DEBRIDEMENT LEFT LEG, WITH  CLOSURE OF BKA;  Surgeon: Leandrew Koyanagi, MD;  Location: [place];  Service: Orthopedics;  Laterality: Left;  . Tee without cardioversion N/A 06/11/2015    Procedure: TRANSESOPHAGEAL ECHOCARDIOGRAM (TEE);  Surgeon: Lelon Perla, MD;  Location: [facility];  Service: Cardiovascular;  Laterality: N/A;   Family History  Problem Relation Age of Onset  . Diabetes Mother   . Diabetes Father   . Diabetes Brother    History  Substance Use Topics  . Smoking status: Never Smoker   . Smokeless tobacco: Never Used  . Alcohol Use: No   OB History    No data available     Review of Systems  Skin: Positive for wound.  All other systems reviewed and are negative.     Allergies  Review of patient's allergies indicates no known allergies.  Home Medications   Prior to Admission medications   Medication Sig Start Date End Date Taking? Authorizing Provider  acetaminophen (TYLENOL) 325 MG tablet Take 2 tablets (650 mg total) by mouth every 6 (six) hours as needed for mild pain or moderate pain. 06/18/15  Yes Ivan Anchors Love, PA-C  Blood Glucose Monitoring Suppl (RELION CONFIRM GLUCOSE MONITOR) W/DEVICE KIT 1 kit by Does not apply route 3 (three) times daily. 03/22/15  Yes Costin Karlyne Greenspan, MD  ceFAZolin (ANCEF) 2-3 GM-% SOLR  Inject 50 mLs (2 g total) into the vein every 8 (eight) hours. 06/24/15 07/05/15 Yes Thurnell Lose, MD  glucose monitoring kit (FREESTYLE) monitoring kit 1 each by Does not apply route 4 (four) times daily - after meals and at bedtime. 1 month Diabetic Testing Supplies for QAC-QHS accuchecks. 12/20/14  Yes Lorayne Marek, MD  insulin aspart (NOVOLOG FLEXPEN) 100 UNIT/ML FlexPen Inject 10 Units into the skin 3 (three) times daily with meals. 06/18/15  Yes Ivan Anchors Love, PA-C  Insulin Glargine (LANTUS SOLOSTAR) 100 UNIT/ML Solostar Pen Inject 15 Units into the skin 2 (two) times daily. 06/24/15  Yes Thurnell Lose, MD  Insulin Pen Needle 32G X 4 MM MISC 1 application by Does not apply route 4 (four) times daily -  with meals and at bedtime. 06/18/15  Yes Ivan Anchors Love, PA-C  iron polysaccharides (NIFEREX) 150 MG capsule Take 1 capsule (150 mg total) by mouth 2 times daily at 12 noon and 4 pm. 06/18/15  Yes Ivan Anchors Love, PA-C  Multiple Vitamin (MULTIVITAMIN WITH MINERALS) TABS tablet Take 1 tablet by mouth daily. 06/18/15  Yes Ivan Anchors Love, PA-C  polycarbophil (FIBERCON) 625 MG tablet Take 1 tablet (625 mg total) by mouth 2 (two) times daily. 06/18/15  Yes Ivan Anchors Love, PA-C  simethicone (MYLICON) 80 MG chewable tablet Chew 1 tablet (80 mg total) by mouth 4 (four) times daily as needed for flatulence (for gas and bloating). 06/18/15  Yes Ivan Anchors Love, PA-C  traMADol (ULTRAM) 50 MG tablet Take 0.5-1 tablets (25-50 mg total) by mouth every 6 (six) hours as needed for severe pain. Patient not taking: Reported on 06/26/2015 06/18/15   Ivan Anchors Love, PA-C   BP 128/73 mmHg  Pulse 91  Temp(Src) 98.6 F (37 C) (Oral)  Resp 16  Ht _0  (1.727 m)  SpO2 100%  LMP 05/12/2013 Physical Exam  Constitutional: She is oriented to person, place, and time. She appears well-developed and well-nourished. No distress.  HENT:  Head: Normocephalic and atraumatic.  Eyes: Conjunctivae and EOM are normal.  Neck: Normal range  of motion.  Cardiovascular: Normal rate and regular rhythm.  Exam reveals no gallop and no friction rub.   No murmur heard. Pulmonary/Chest: Effort normal and breath sounds normal. She has no wheezes. She has no rales. She exhibits no tenderness.  Abdominal: Soft. She exhibits no distension. There is no tenderness. There is no rebound.  Musculoskeletal: Normal range of motion.  Dehiscence of BKA surgical wound at the distal aspect of the left stump measuring about 10cmx2cm. No drainage or signs of infection. No bone exposed.   Neurological: She is alert and oriented to person, place, and time. Coordination normal.  Speech is goal-oriented. Moves limbs without ataxia.   Skin: Skin is warm and dry.  Psychiatric: She has a  normal mood and affect. Her behavior is normal.  Nursing note and vitals reviewed.   ED Course  Procedures (including critical care time) Labs Review Labs Reviewed  COMPREHENSIVE METABOLIC PANEL - Abnormal; Notable for the following:    Glucose, Bld 219 (*)    Calcium 8.4 (*)    Albumin 2.8 (*)    ALT 8 (*)    All other components within normal limits  CBC WITH DIFFERENTIAL/PLATELET - Abnormal; Notable for the following:    RBC 3.20 (*)    Hemoglobin 9.3 (*)    HCT 29.2 (*)    Eosinophils Relative 7 (*)    All other components within normal limits  I-STAT CG4 LACTIC ACID, ED  I-STAT CG4 LACTIC ACID, ED    Imaging Review No results found.   EKG Interpretation None      MDM   Final diagnoses:  Pre-op chest exam  Wound dehiscence, initial encounter    2:39 PM Patient has an open wound of left BKA stump. No signs of infection. Vitals stable and patient afebrile. i will consult Dr. Erlinda Hong who performed the amputation.    2:51 PM I spoke with Dr. Lorin Mercy who advised to admit the patient and he will consult to revision surgery. Pre op testing ordered. Vitals stable and patient afebrile.   Alvina Chou, PA-C 06/26/15 1525  Elnora Morrison, MD 06/26/15  [phone]

## 2015-06-26 NOTE — Transfer of Care (Signed)
Immediate Anesthesia Transfer of Care Note  Patient: [name]  Procedure(s) Performed: Procedure(s): AMPUTATION BELOW KNEE STUMP RECLOSURE  (Left)  Patient Location: PACU  Anesthesia Type:General  Level of Consciousness: awake, alert , oriented and patient cooperative  Airway & Oxygen Therapy: Patient Spontanous Breathing and Patient connected to nasal cannula oxygen  Post-op Assessment: Report given to RN, Post -op Vital signs reviewed and stable, Patient moving all extremities and Patient moving all extremities X 4  Post vital signs: Reviewed and stable  Last Vitals:  Filed Vitals:   06/26/15 1612  BP: 100/61  Pulse: 91  Temp:   Resp: 17    Complications: No apparent anesthesia complications

## 2015-06-27 ENCOUNTER — Encounter (HOSPITAL_COMMUNITY): Payer: Self-pay | Admitting: Orthopaedic Surgery

## 2015-06-27 LAB — GLUCOSE, CAPILLARY
GLUCOSE-CAPILLARY: 209 mg/dL — AB (ref 65–99)
Glucose-Capillary: 211 mg/dL — ABNORMAL HIGH (ref 65–99)
Glucose-Capillary: 194 mg/dL — ABNORMAL HIGH (ref 65–99)
Glucose-Capillary: 214 mg/dL — ABNORMAL HIGH (ref 65–99)
Glucose-Capillary: 213 mg/dL — ABNORMAL HIGH (ref 65–99)
Glucose-Capillary: 176 mg/dL — ABNORMAL HIGH (ref 65–99)

## 2015-06-27 LAB — CULTURE, BLOOD (ROUTINE X 2)
Culture: NO GROWTH
Culture: NO GROWTH

## 2015-06-27 LAB — BASIC METABOLIC PANEL
Anion gap: 6 (ref 5–15)
BUN: 7 mg/dL (ref 6–20)
CO2: 27 mmol/L (ref 22–32)
Calcium: 8.3 mg/dL — ABNORMAL LOW (ref 8.9–10.3)
Chloride: 106 mmol/L (ref 101–111)
Creatinine, Ser: 0.53 mg/dL (ref 0.44–1.00)
GFR calc non Af Amer: >60 mL/min (ref >60)
Glucose, Bld: 260 mg/dL — ABNORMAL HIGH (ref 65–99)
POTASSIUM: 4.2 mmol/L (ref 3.5–5.1)
Sodium: 139 mmol/L (ref 135–145)

## 2015-06-27 NOTE — Op Note (Signed)
NAMEMarland Kitchen  [name], [name] NO.:  [id number]  MEDICAL RECORD NO.:  [id number]  LOCATION:  5N03C                        FACILITY:  [facility]  PHYSICIAN:  Mark C. Lorin Mercy, M.D.    DATE OF BIRTH:  December 28, 1965  DATE OF PROCEDURE:  06/26/2015 DATE OF DISCHARGE:                              OPERATIVE REPORT   PREOPERATIVE DIAGNOSIS:  Left below-knee amputation dehiscence, traumatic secondary to a fall.  POSTOPERATIVE DIAGNOSIS:  Left below-knee amputation dehiscence, traumatic secondary to a fall.  PROCEDURE:  Irrigation and debridement, sharp excisional debridement of skin, subcutaneous tissue, fascia, and re-closure of the left below-knee amputation stump.  SURGEON:  Mark C. Lorin Mercy, MD  TOURNIQUET:  None.  COMPLICATIONS:  None.  INDICATIONS:  This 49 year old female had BKA done by Dr. Erlinda Hong.  Had her staples removed yesterday and then fell on her stump today splitting it open.  She had debridement performed and closure on June 08, 2015, by Dr. Erlinda Hong.  PROCEDURE IN DETAIL:  After standard prepping and draping, preoperative Ancef prophylaxis, usual proximal thigh tourniquet was applied. DuraPrep was used.  Time-out procedure was completed.  Usual extremity sheets, drapes were applied.  Sharp excisional debridement was performed after time-out removing eschar, some necrotic skin edges, some remaining monofilament nylon sutures possibly PDS.  Any devitalized tissue was sharply debrided.  Wound was scraped with a curette.  No bone was exposed.  The gastrocsoleus fascia covered the area.  Incision was split from the medial edge of the tibia laterally out past the fibula and then about proximally up to the fibular neck.  After copious irrigation, continued debridement using Beyer rongeur, pickup scalpel, and scissors with the wound fine, clean, repeat washing, more irrigating, 2-0 nylon was used with a far-near, near-far closure for reapproximating the skin. There was no  significant tension on the skin edges.  Xeroform, 4x4s, ABD, battle dressing with regular stockinette split at the tip with extra Kerlix tucked in.  Patient was placed in a knee immobilizer with the end of the knee immobilizer hanging inferior to prevent or help protect the tip of the stump if patient falls again.  The patient was transferred into the recovery room in stable condition.     Mark C. Lorin Mercy, M.D.    MCY/MEDQ  D:  06/26/2015  T:  06/27/2015  Job:  [id number]

## 2015-06-27 NOTE — Progress Notes (Signed)
[facility]  Patient Status:   Active pt with [facility] prior to this readmission  [facility] is providing the following services: HHRN, and home infusion pharmacy for home IV ABX.  [facility] hospital team wlll follow pt and work with hospital Case management team to support safe and most appropriate DC plan when pt is ready for DC.  If patient discharges after hours, please call [phone].   Larry Sierras 06/27/2015, 10:22 AM

## 2015-06-27 NOTE — Progress Notes (Signed)
PT Cancellation Note  Patient Details Name: [name] MRN: [id number] DOB: 02-Oct-1966   Cancelled Treatment:    Reason Eval/Treat Not Completed: Patient declined, no reason specified Spent ~16 minutes with pt and interpreter Burman Nieves, [place]) discussing pt's PLOF and recent admission. Pt adamant about wanting to return home. Declines PT services at this time. Pt has all necessary DME and support at home. Reports her fall happened while in the hospital and pt has no issues with mobility at home. Will sign off for now. Thanks   Hosea Hanawalt A Yovani Cogburn 06/27/2015, 11:33 AM Wray Kearns, PT, DPT [phone]

## 2015-06-27 NOTE — Progress Notes (Signed)
PT Cancellation Note  Patient Details Name: Cleaster Shiffer MRN: [id number] DOB: 1966-08-15   Cancelled Treatment:    Reason Eval/Treat Not Completed: Patient not medically ready Pt currently on bedrest. Will follow up with PT evaluation once activity orders are increased.   Marguarite Arbour A Charls Custer 06/27/2015, 7:43 AM Wray Kearns, PT, DPT [phone]

## 2015-06-27 NOTE — Progress Notes (Signed)
Subjective: 1 Day Post-Op Procedure(s) (LRB): AMPUTATION BELOW KNEE STUMP RECLOSURE  (Left) Patient reports pain as 1 on 0-10 scale.    Objective: Vital signs in last 24 hours: Temp:  [98.1 F (36.7 C)-98.6 F (37 C)] 98.6 F (37 C) (07/27 2031) Pulse Rate:  [87-101] 89 (07/27 2031) Resp:  [11-20] 16 (07/27 2031) BP: (94-128)/(58-75) 107/58 mmHg (07/27 2031) SpO2:  [95 %-100 %] 99 % (07/27 2031)  Intake/Output from previous day: 07/27 0701 - 07/28 0700 In: 1100 [I.V.:1100] Out: -  Intake/Output this shift:     Recent Labs  06/26/15 1232  HGB 9.3*    Recent Labs  06/26/15 1232  WBC 7.1  RBC 3.20*  HCT 29.2*  PLT 395    Recent Labs  06/26/15 1232 06/27/15 0445  NA 136 139  K 4.0 4.2  CL 103 106  CO2 26 27  BUN 13 7  CREATININE 0.71 0.53  GLUCOSE 219* 260*  CALCIUM 8.4* 8.3*   No results for input(s): LABPT, INR in the last 72 hours.  dressing dry.   Assessment/Plan: 1 Day Post-Op Procedure(s) (LRB): AMPUTATION BELOW KNEE STUMP RECLOSURE  (Left) Up with therapy  Keep knee immobilizer on when she gets up to protect stump if she falls. Needs PT to re-eval due to falling.  Will need to be here until she gets 48 hrs of ABX. Can go home Friday after last Ancef dose.   Kynnedy Carreno C 06/27/2015, 7:11 AM

## 2015-06-28 ENCOUNTER — Telehealth: Payer: Self-pay | Admitting: *Deleted

## 2015-06-28 LAB — BASIC METABOLIC PANEL
Anion gap: 5 (ref 5–15)
BUN: 6 mg/dL (ref 6–20)
CO2: 29 mmol/L (ref 22–32)
CREATININE: 0.46 mg/dL (ref 0.44–1.00)
Calcium: 8.3 mg/dL — ABNORMAL LOW (ref 8.9–10.3)
Chloride: 104 mmol/L (ref 101–111)
GLUCOSE: 232 mg/dL — AB (ref 65–99)
POTASSIUM: 3.6 mmol/L (ref 3.5–5.1)
SODIUM: 138 mmol/L (ref 135–145)

## 2015-06-28 LAB — GLUCOSE, CAPILLARY
Glucose-Capillary: 198 mg/dL — ABNORMAL HIGH (ref 65–99)
Glucose-Capillary: 190 mg/dL — ABNORMAL HIGH (ref 65–99)

## 2015-06-28 MED ORDER — HEPARIN SOD (PORK) LOCK FLUSH 100 UNIT/ML IV SOLN
250.0000 [IU] | INTRAVENOUS | Status: AC | PRN
Start: 2015-06-28 — End: 2015-06-28
  Administered 2015-06-28: 250 [IU]

## 2015-06-28 MED ORDER — TRAMADOL HCL 50 MG PO TABS: 25.0000 mg | ORAL_TABLET | Freq: Four times a day (QID) | ORAL | Status: DC | PRN

## 2015-06-28 NOTE — Progress Notes (Signed)
Pt ready for d/c after 2 pm dose of Ancef per MD. PICC line flushed after IV antibx finished and capped off by IV team nurse. Discharge teaching and prescriptions given to pt and husband at bedside, interpreter services used. All questions answered. Pt wheeled out by NT.  Raquel James 06/28/2015 2:52 PM

## 2015-06-28 NOTE — Progress Notes (Signed)
Patient is stable from closure of dehiscence.  Plan is to dc home today with close follow up next week,  N. Eduard Roux, MD [place] 10:27 AM

## 2015-06-28 NOTE — Discharge Summary (Signed)
Physician Discharge Summary      Patient ID: [name] MRN: [id number] DOB/AGE: 06-20-1966 49 y.o.  Admit date: 06/26/2015 Discharge date: 06/28/2015  Admission Diagnoses:  Traumatic dehiscence of amputation stump  Discharge Diagnoses:  Active Problems:   Wound dehiscence   Past Medical History  Diagnosis Date  . Diabetes mellitus   . 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 Janie Townsend), Cysto performed by Dr. Jonna Townsend 12/2009 with evac of clots and bx and fulguration. // H/O stage 2 SCC of the vulva  . Anemia     2/2 blood loss  . Diabetes mellitus type 2, uncontrolled DX: 2001  . 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 Diabetic foot ulcers   . Cataract   . Diabetes mellitus without complication     Surgeries: Procedure(s): AMPUTATION BELOW KNEE STUMP RECLOSURE  on 06/26/2015   Consultants (if any): Treatment Team:  Hailey Killings, MD  Discharged Condition: Improved  Hospital Course: [name] is an 49 y.o. female who was admitted 06/26/2015 with a diagnosis of traumatic dehiscence of amputation stump and went to the operating room on 06/26/2015 and underwent the above named procedures.    She was given perioperative antibiotics:  Anti-infectives    Start     Dose/Rate Route Frequency Ordered Stop   06/27/15 0100  ceFAZolin (ANCEF) IVPB 1 g/50 mL premix     1 g 100 mL/hr over 30 Minutes Intravenous 3 times per day 06/26/15 1940 06/28/15 1341   06/26/15 1646  ceFAZolin (ANCEF) 2-3 GM-% IVPB SOLR    Comments:  [name]   : cabinet override      06/26/15 1646 06/27/15 0459   06/26/15 1645  ceFAZolin (ANCEF) IVPB 2 g/50 mL premix     2 g 100 mL/hr over 30 Minutes Intravenous  Once 06/26/15 1644 06/26/15 1719     .  She was given sequential compression devices, early ambulation, and aspirin for DVT prophylaxis.  She benefited maximally from the hospital stay and there were no complications.    Recent vital signs:  Filed Vitals:   06/28/15 0500  BP: 109/55  Pulse: 93  Temp: 98.3 F (36.8 C)  Resp: 16    Recent laboratory studies:  Lab Results  Component Value Date   HGB 9.3* 06/26/2015   HGB 8.4* 06/24/2015   HGB 8.0* 06/23/2015   Lab Results  Component Value Date   WBC 7.1 06/26/2015   PLT 395 06/26/2015   Lab Results  Component Value Date   INR 1.43 06/02/2015   Lab Results  Component Value Date   NA 138 06/28/2015   K 3.6 06/28/2015   CL 104 06/28/2015   CO2 29 06/28/2015   BUN 6 06/28/2015   CREATININE 0.46 06/28/2015   GLUCOSE 232* 06/28/2015    Discharge Medications:     Medication List    TAKE these medications        acetaminophen 325 MG tablet  Commonly known as:  TYLENOL  Take 2 tablets (650 mg total) by mouth every 6 (six) hours as needed for mild pain or moderate pain.     ceFAZolin 2-3 GM-% Solr  Commonly known as:  ANCEF  Inject 50 mLs (2 g total) into the vein every  8 (eight) hours.     glucose monitoring kit monitoring kit  1 each by Does not apply route 4 (four) times daily - after meals and at bedtime. 1 month Diabetic Testing Supplies for QAC-QHS accuchecks.     RELION CONFIRM GLUCOSE MONITOR W/DEVICE Kit  1 kit by Does not apply route 3 (three) times daily.     insulin aspart 100 UNIT/ML FlexPen  Commonly known as:  NOVOLOG FLEXPEN  Inject 10 Units into the skin 3 (three) times daily with meals.     Insulin Glargine 100 UNIT/ML Solostar Pen  Commonly known as:  LANTUS SOLOSTAR  Inject 15 Units into the skin 2 (two) times daily.     Insulin Pen Needle 32G X 4 MM Misc  1 application by Does not apply route 4 (four) times daily -  with meals and at bedtime.     iron polysaccharides 150 MG capsule  Commonly known as:  NIFEREX  Take 1  capsule (150 mg total) by mouth 2 times daily at 12 noon and 4 pm.     multivitamin with minerals Tabs tablet  Take 1 tablet by mouth daily.     polycarbophil 625 MG tablet  Commonly known as:  FIBERCON  Take 1 tablet (625 mg total) by mouth 2 (two) times daily.     simethicone 80 MG chewable tablet  Commonly known as:  MYLICON  Chew 1 tablet (80 mg total) by mouth 4 (four) times daily as needed for flatulence (for gas and bloating).     traMADol 50 MG tablet  Commonly known as:  ULTRAM  Take 0.5-1 tablets (25-50 mg total) by mouth every 6 (six) hours as needed for severe pain.        Diagnostic Studies: Dg Chest 2 View  06/26/2015   CLINICAL DATA:  Preop for incision and drainage of left leg wound.  EXAM: CHEST  2 VIEW  COMPARISON:  June 04, 2015.  FINDINGS: The heart size and mediastinal contours are within normal limits. Both lungs are clear. No pneumothorax or pleural effusion is noted. Left-sided PICC line is noted with distal tip in expected position of cavoatrial junction. The visualized skeletal structures are unremarkable.  IMPRESSION: Left-sided PICC line is noted with distal tip in expected position of cavoatrial junction. No acute cardiopulmonary abnormality seen.   Electronically Signed   By: [name], M.D.   On: 06/26/2015 15:45   Dg Knee 1-2 Views Left  06/26/2015   CLINICAL DATA:  Open wound at below the knee amputation site  EXAM: LEFT KNEE - 1-2 VIEW  COMPARISON:  None.  FINDINGS: There are changes consistent with a below-knee amputation. There is a mottled appearance to the overlying soft tissues at the amputation site as well as gas along the lateral aspect of the knee joint consistent with localized infection.  IMPRESSION: Open wound with evidence of gas within the soft tissues laterally adjacent to the knee joint. This would be consistent with further soft tissue infection   Electronically Signed   By: [name] M.D.   On: 06/26/2015 15:45   Dg Chest Port 1  View  06/04/2015   CLINICAL DATA:  Respiratory failure.  Assess endotracheal tube  EXAM: PORTABLE CHEST - 1 VIEW  COMPARISON:  06/03/2015  FINDINGS: The endotracheal tube is in good position between the clavicular heads and carina. Stable subclavian central line from the left, tip at the upper cavoatrial junction. The orogastric tube enters the stomach at least.  Normal heart size and mediastinal contours. No acute infiltrate or edema. No effusion or pneumothorax.  IMPRESSION: 1. Tubes and central line remain in good position. 2. Lungs remain clear.   Electronically Signed   By: Monte Fantasia M.D.   On: 06/04/2015 05:52   Dg Chest Port 1 View  06/03/2015   CLINICAL DATA:  Respiratory failure.  EXAM: PORTABLE CHEST - 1 VIEW  COMPARISON:  06/02/2015  FINDINGS: The cardiac silhouette, mediastinal and hilar contours are within normal limits and stable. The endotracheal tube, NG tube and left subclavian catheters are unchanged. No significant pulmonary findings. No pleural effusion or pneumothorax.  IMPRESSION: 1. Stable support apparatus. 2. No significant pulmonary findings.   Electronically Signed   By: Hailey Sanes M.D.   On: 06/03/2015 07:09   Dg Chest Port 1 View  06/02/2015   CLINICAL DATA:  Septic shock.  History of vaginal cancer.  EXAM: PORTABLE CHEST - 1 VIEW  COMPARISON:  06/02/2015  FINDINGS: Support devices are in stable position. Heart is borderline in size. No confluent airspace opacities or effusions. No acute bony abnormality.  IMPRESSION: No acute findings.  No change.  Stable support devices.   Electronically Signed   By: Rolm Baptise M.D.   On: 06/02/2015 08:06   Dg Chest Port 1 View  06/02/2015   CLINICAL DATA:  Central line placement  EXAM: PORTABLE CHEST - 1 VIEW  COMPARISON:  06/01/2015  FINDINGS: The endotracheal tube is 2 cm above the carina. The nasogastric tube extends into the stomach. There is a left subclavian central line extending to the low SVC. There is no pneumothorax. The  lungs are clear. There is no large effusion.  IMPRESSION: Support equipment appears satisfactorily positioned.  The lungs are grossly clear.  No pneumothorax.   Electronically Signed   By: Andreas Newport M.D.   On: 06/02/2015 06:39   Dg Chest Port 1 View  06/01/2015   CLINICAL DATA:  Sepsis  EXAM: PORTABLE CHEST - 1 VIEW  COMPARISON:  06/27/2014  FINDINGS: A single AP portable view of the chest demonstrates no focal airspace consolidation or alveolar edema. The lungs are grossly clear. There is no large effusion or pneumothorax. Cardiac and mediastinal contours appear unremarkable.  IMPRESSION: No active disease.   Electronically Signed   By: Andreas Newport M.D.   On: 06/01/2015 23:00   Ap / Lateral X-ray Left Foot  06/01/2015   CLINICAL DATA:  Pain and swelling of the foot with discoloration and open wounds.  EXAM: LEFT FOOT - 2 VIEW  COMPARISON:  03/20/2015  FINDINGS: There is prior transmetatarsal amputation of the second digit.  There is extensive soft tissue gas throughout the forefoot, midfoot and hindfoot with marked soft tissue swelling. There is no radiopaque foreign body. There is no frank bony destruction.  IMPRESSION: Extensive soft tissue gas throughout the foot. Soft tissue gas appears to be tracking up the lower leg as well, at the margin of the AP image.   Electronically Signed   By: Andreas Newport M.D.   On: 06/01/2015 23:12    Disposition: 01-Home or Self Care      Discharge Instructions    Call MD / Call 911    Complete by:  As directed   If you experience chest pain or shortness of breath, CALL 911 and be transported to the hospital emergency room.  If you develope a fever above 101.5 F, pus (white drainage) or increased drainage or redness at the wound,  or calf pain, call your surgeon's office.     Call MD / Call 911    Complete by:  As directed   If you experience chest pain or shortness of breath, CALL 911 and be transported to the hospital emergency room.  If you  develope a fever above 101 F, pus (white drainage) or increased drainage or redness at the wound, or calf pain, call your surgeon's office.     Constipation Prevention    Complete by:  As directed   Drink plenty of fluids.  Prune juice may be helpful.  You may use a stool softener, such as Colace (over the counter) 100 mg twice a day.  Use MiraLax (over the counter) for constipation as needed.     Constipation Prevention    Complete by:  As directed   Drink plenty of fluids.  Prune juice may be helpful.  You may use a stool softener, such as Colace (over the counter) 100 mg twice a day.  Use MiraLax (over the counter) for constipation as needed.     Diet - low sodium heart healthy    Complete by:  As directed      Diet - low sodium heart healthy    Complete by:  As directed      Diet general    Complete by:  As directed      Discharge instructions    Complete by:  As directed   Knee immobilizer must be on at all times.  Do not remove dressing or get wet.  Must be assisted when up and ambulating.     Driving restrictions    Complete by:  As directed   No driving while taking narcotic pain meds.     Driving restrictions    Complete by:  As directed   No driving     Increase activity slowly as tolerated    Complete by:  As directed      Increase activity slowly as tolerated    Complete by:  As directed            Follow-up Information    Follow up with Marianna Payment, MD On 07/01/2015.   Specialty:  Orthopedic Surgery   Why:  For wound re-check   Contact information:   [place] [address] [phone]       Follow up with [facility].   Why:  Someone from [facility] will contact you concerning start date and time for theray   Contact information:   [address] [place] [address] [phone]        Signed: Marianna Payment 06/28/2015, 3:09 PM

## 2015-06-28 NOTE — Telephone Encounter (Signed)
Lattie Haw RN from [facility] called to notify Dr Naaman Plummer  Resumption of care is delayed due to they are unable to reach patient.

## 2015-07-01 ENCOUNTER — Ambulatory Visit (INDEPENDENT_AMBULATORY_CARE_PROVIDER_SITE_OTHER): Payer: No Typology Code available for payment source | Admitting: Internal Medicine

## 2015-07-01 ENCOUNTER — Encounter: Payer: Self-pay | Admitting: Internal Medicine

## 2015-07-01 VITALS — BP 96/62 | HR 96 | Temp 98.4°F

## 2015-07-01 DIAGNOSIS — R7881 Bacteremia: Secondary | ICD-10-CM

## 2015-07-01 DIAGNOSIS — B9561 Methicillin susceptible Staphylococcus aureus infection as the cause of diseases classified elsewhere: Secondary | ICD-10-CM

## 2015-07-01 NOTE — Progress Notes (Signed)
Phone call to [place] per Dr. Megan Salon.  Stop IV antibiotics and pull PICC.  Verbalized back by [facility], [facility] pharmacy.
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
## 2015-07-02 ENCOUNTER — Telehealth: Payer: Self-pay | Admitting: Internal Medicine

## 2015-07-02 ENCOUNTER — Telehealth: Payer: Self-pay

## 2015-07-02 ENCOUNTER — Other Ambulatory Visit: Payer: Self-pay | Admitting: Pharmacist

## 2015-07-02 MED ORDER — GLUCOSE BLOOD VI STRP: ORAL_STRIP | Status: DC

## 2015-07-02 MED ORDER — TRUEPLUS LANCETS 28G MISC: Status: DC

## 2015-07-02 MED ORDER — TRUE METRIX METER DEVI: 1.0000 | Freq: Three times a day (TID) | Status: DC

## 2015-07-02 NOTE — Telephone Encounter (Signed)
Pt is having technical difficulties with her glucometer and needs advise about what to do. Please follow up with pt.

## 2015-07-02 NOTE — Telephone Encounter (Signed)
Returned patient phone call via in house interpreter Patient states she picked up a new glucometer and it does not seem to be working The interpreter and myself walked the patient though the steps and the glucometer is now working

## 2015-07-04 ENCOUNTER — Encounter: Payer: Self-pay | Admitting: Internal Medicine

## 2015-07-04 ENCOUNTER — Ambulatory Visit: Payer: No Typology Code available for payment source | Attending: Internal Medicine | Admitting: Internal Medicine

## 2015-07-04 VITALS — BP 101/64 | HR 94 | Temp 98.0°F | Resp 16

## 2015-07-04 DIAGNOSIS — E139 Other specified diabetes mellitus without complications: Secondary | ICD-10-CM

## 2015-07-04 DIAGNOSIS — E1165 Type 2 diabetes mellitus with hyperglycemia: Secondary | ICD-10-CM | POA: Insufficient documentation

## 2015-07-04 DIAGNOSIS — E559 Vitamin D deficiency, unspecified: Secondary | ICD-10-CM

## 2015-07-04 DIAGNOSIS — Z89512 Acquired absence of left leg below knee: Secondary | ICD-10-CM

## 2015-07-04 DIAGNOSIS — Z79899 Other long term (current) drug therapy: Secondary | ICD-10-CM | POA: Insufficient documentation

## 2015-07-04 DIAGNOSIS — Z794 Long term (current) use of insulin: Secondary | ICD-10-CM | POA: Insufficient documentation

## 2015-07-04 LAB — COMPLETE METABOLIC PANEL WITH GFR
ALK PHOS: 123 U/L (ref 33–130)
ALT: 4 U/L — ABNORMAL LOW (ref 6–29)
AST: 14 U/L (ref 10–35)
Albumin: 3.3 g/dL — ABNORMAL LOW (ref 3.6–5.1)
BUN: 13 mg/dL (ref 7–25)
CALCIUM: 8.6 mg/dL (ref 8.6–10.4)
CO2: 29 mmol/L (ref 20–31)
CREATININE: 0.63 mg/dL (ref 0.50–1.05)
Chloride: 103 mmol/L (ref 98–110)
GFR, Est African American: >89 mL/min (ref >=60)
GFR, Est Non African American: >89 mL/min (ref >=60)
Glucose, Bld: 148 mg/dL — ABNORMAL HIGH (ref 65–99)
Potassium: 4.4 mmol/L (ref 3.5–5.3)
SODIUM: 138 mmol/L (ref 135–146)
Total Bilirubin: 0.2 mg/dL (ref 0.2–1.2)
Total Protein: 6.8 g/dL (ref 6.1–8.1)

## 2015-07-04 LAB — GLUCOSE, POCT (MANUAL RESULT ENTRY): POC GLUCOSE: 139 mg/dL — AB (ref 70–99)

## 2015-07-04 MED ORDER — INSULIN GLARGINE 100 UNIT/ML SOLOSTAR PEN: 25.0000 [IU] | PEN_INJECTOR | Freq: Every day | SUBCUTANEOUS | Status: DC

## 2015-07-04 MED ORDER — INSULIN ASPART 100 UNIT/ML FLEXPEN
10.0000 [IU] | PEN_INJECTOR | Freq: Three times a day (TID) | SUBCUTANEOUS | Status: DC
Start: 2015-07-04 — End: 2016-05-29

## 2015-07-04 NOTE — Progress Notes (Signed)
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

## 2015-07-04 NOTE — Patient Instructions (Signed)
La diabetes mellitus y los alimentos (Diabetes Mellitus and Food) Es importante que controle su nivel de azcar en la sangre (glucosa). El nivel de glucosa en sangre depende en gran medida de lo que usted come. Comer alimentos saludables en las cantidades [place] a lo largo del Training and development officer, aproximadamente a la misma hora [employer], lo ayudar a Chief Technology Officer su nivel de Multimedia programmer. Tambin puede ayudarlo a retrasar o Patent attorney de la diabetes mellitus. Comer de [employer] saludable incluso puede ayudarlo a Chartered loss adjuster de presin arterial y a Science writer o Theatre manager un peso saludable.  CMO PUEDEN AFECTARME LOS ALIMENTOS? Carbohidratos Los carbohidratos afectan el nivel de glucosa en sangre ms que cualquier otro tipo de alimento. El nutricionista lo ayudar a Teacher, adult education cuntos carbohidratos puede consumir en cada comida y ensearle a contarlos. El recuento de carbohidratos es importante para mantener la glucosa en sangre en un nivel saludable, en especial si utiliza insulina o toma determinados medicamentos para la diabetes mellitus. Alcohol El alcohol puede provocar disminuciones sbitas de la glucosa en sangre (hipoglucemia), en especial si utiliza insulina o toma determinados medicamentos para la diabetes mellitus. La hipoglucemia es una afeccin que puede poner en peligro la vida. Los sntomas de la hipoglucemia (somnolencia, mareos y Data processing manager) son similares a los sntomas de haber consumido mucho alcohol.  Si el mdico lo autoriza a beber alcohol, hgalo con moderacin y siga estas pautas:  Las mujeres no deben beber ms de un trago por da, y los hombres no deben beber ms de dos tragos por Training and development officer. Un trago es igual a:  12 onzas (355 ml) de cerveza  5 onzas de vino (150 ml) de vino  1,5onzas (35ml) de bebidas espirituosas  No beba con el estmago vaco.  Mantngase hidratado. Beba agua, gaseosas dietticas o t helado sin azcar.  Las gaseosas comunes, los jugos y  otros refrescos podran contener muchos carbohidratos y se Civil Service fast streamer. QU ALIMENTOS NO SE RECOMIENDAN? Cuando haga las elecciones de alimentos, es importante que recuerde que todos los alimentos son distintos. Algunos tienen menos nutrientes que otros por porcin, aunque podran tener la misma cantidad de caloras o carbohidratos. Es difcil darle al cuerpo lo que necesita cuando consume alimentos con menos nutrientes. Estos son algunos ejemplos de alimentos que debera evitar ya que contienen muchas caloras y carbohidratos, pero pocos nutrientes:  Physicist, medical trans (la mayora de los alimentos procesados incluyen grasas trans en la etiqueta de Informacin nutricional).  Gaseosas comunes.  Jugos.  Caramelos.  Dulces, como tortas, pasteles, rosquillas y [place].  Comidas fritas. QU ALIMENTOS PUEDO COMER? Consuma alimentos ricos en nutrientes, que nutrirn el cuerpo y lo mantendrn saludable. Los alimentos que debe comer tambin dependern de varios factores, como:  Las caloras que necesita.  Los medicamentos que toma.  Su peso.  El nivel de glucosa en [place].  El [place] de presin arterial.  El nivel de colesterol. Tambin debe consumir una variedad de [place], como:  Protenas, como carne, aves, pescado, tofu, frutos secos y semillas (las protenas de [place] magros son mejores).  Lambert Mody.  Verduras.  Productos lcteos, como [place], queso y yogur (descremados son mejores).  Panes, granos, pastas, cereales, arroz y frijoles.  Grasas, como aceite de [place], [place] sin grasas trans, aceite de canola, aguacate y [place]. TODOS LOS QUE PADECEN DIABETES MELLITUS TIENEN EL [place] PLAN DE [place]? Dado que todas las personas que padecen diabetes mellitus son distintas, no hay un solo plan de comidas que funcione para todos. Es muy  importante que se rena con un nutricionista que lo ayudar a crear un plan de comidas adecuado para usted. Document Released: 02/23/2008  Document Revised: 11/21/2013 [facility] Patient Information 2015 [place]. This information is not intended to replace advice given to you by your health care provider. Make sure you discuss any questions you have with your health care provider.

## 2015-07-04 NOTE — Progress Notes (Signed)
MRN: [id number] Name: [name]  Sex: female Age: 49 y.o. DOB: August 14, 1966  Allergies: Review of patient's allergies indicates no known allergies.  Chief Complaint  Patient presents with  . Follow-up    HPI: Patient is 49 y.o. female who  has history of diabetes, last month she was hospitalized with a left foot gangrene, EMR reviewed he also had MSSA bacteremia she underwent left BKA, subsequently she was on IV antibiotics had a PICC line infection as well which was removed and she subsequently completed the course of antibiotic recently followed up with ID and her PICC line was removed, she denies any fever chills currently taking insulin NovoLog 10 units 3 times a day, also on Lantus 24 units each bedtime, she denies any hypoglycemic symptoms, still her blood sugar has been running high.  Past Medical History  Diagnosis Date  . Diabetes mellitus   . 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 Janie Morning), Cysto performed by Dr. Jonna Munro 12/2009 with evac of clots and bx and fulguration. // H/O stage 2 SCC of the vulva  . Anemia     2/2 blood loss  . Diabetes mellitus type 2, uncontrolled DX: 2001  . 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 Diabetic foot ulcers   . Cataract   . Diabetes mellitus without complication     Past Surgical History  Procedure Laterality Date  . Radical wide local excision of the vulva and right nguinal lymph node dissection  10/2003    Dr. Fermin Schwab  . Uterine dilatation and currettage    . Labial mass excision  07/2003    Dr. Ree Edman  . Left second toe mtp joint amputation  12/19/2010    Dr. Mayer Camel  . Irrigation and debridement of left foot with removal of left  01/29/2011    Dr. Mayer Camel  . Tubal ligation    . I&d  extremity Left 06/02/2015    Procedure: IRRIGATION AND DEBRIDEMENT EXTREMITY;  Surgeon: Leandrew Koyanagi, MD;  Location: [facility];  Service: Orthopedics;  Laterality: Left;  . Amputation Left 06/02/2015    Procedure: Partial Left  AMPUTATION FOOT;  Surgeon: Leandrew Koyanagi, MD;  Location: [facility];  Service: Orthopedics;  Laterality: Left;  . I&d extremity Left 06/05/2015    Procedure: IRRIGATION AND DEBRIDEMENT LEFT LEG, POSSIBLE CLOSURE BELOW KNEE AMPUTATION;  Surgeon: Leandrew Koyanagi, MD;  Location: [place];  Service: Orthopedics;  Laterality: Left;  . Amputation Left 06/03/2015    Procedure: Left Transtibial amputation,application of wound vac;  Surgeon: Leandrew Koyanagi, MD;  [facility];  Service: Orthopedics;  Laterality: Left;  . I&d extremity Left 06/08/2015    Procedure: IRRIGATION AND DEBRIDEMENT LEFT LEG, WITH  CLOSURE OF BKA;  Surgeon: Leandrew Koyanagi, MD;  Location: [place];  Service: Orthopedics;  Laterality: Left;  . Tee without cardioversion N/A 06/11/2015    Procedure: TRANSESOPHAGEAL ECHOCARDIOGRAM (TEE);  Surgeon: Lelon Perla, MD;  Location: [facility] ENDOSCOPY;  Service: Cardiovascular;  Laterality: N/A;  . Amputation Left 06/26/2015    Procedure: AMPUTATION BELOW KNEE STUMP RECLOSURE ;  Surgeon: Marybelle Killings, MD;  Location: [place];  Service: Orthopedics;  Laterality: Left;      Medication List  This list is accurate as of: 07/04/15  3:42 PM.  Always use your most recent med list.               acetaminophen 325 MG tablet  Commonly known as:  TYLENOL  Take 2 tablets (650 mg total) by mouth every 6 (six) hours as needed for mild pain or moderate pain.     glucose blood test strip  Commonly known as:  TRUE METRIX BLOOD GLUCOSE TEST  USE TO TEST BLOOD SUGAR 3 TIMES DAILY     insulin aspart 100 UNIT/ML FlexPen  Commonly known as:  NOVOLOG FLEXPEN  Inject 10 Units into the skin 3 (three) times daily with meals.     Insulin Glargine 100 UNIT/ML Solostar Pen  Commonly known as:  LANTUS SOLOSTAR    Inject 25 Units into the skin daily at 10 pm.     Insulin Pen Needle 32G X 4 MM Misc  1 application by Does not apply route 4 (four) times daily -  with meals and at bedtime.     iron polysaccharides 150 MG capsule  Commonly known as:  NIFEREX  Take 1 capsule (150 mg total) by mouth 2 times daily at 12 noon and 4 pm.     multivitamin with minerals Tabs tablet  Take 1 tablet by mouth daily.     polycarbophil 625 MG tablet  Commonly known as:  FIBERCON  Take 1 tablet (625 mg total) by mouth 2 (two) times daily.     RELION CONFIRM GLUCOSE MONITOR W/DEVICE Kit  1 kit by Does not apply route 3 (three) times daily.     TRUE METRIX METER Devi  1 each by Does not apply route 3 (three) times daily.     simethicone 80 MG chewable tablet  Commonly known as:  MYLICON  Chew 1 tablet (80 mg total) by mouth 4 (four) times daily as needed for flatulence (for gas and bloating).     traMADol 50 MG tablet  Commonly known as:  ULTRAM  Take 0.5-1 tablets (25-50 mg total) by mouth every 6 (six) hours as needed for severe pain.     TRUEPLUS LANCETS 28G Misc  USE TO TEST BLOOD SUGAR 3 TIMES A DAY        Meds ordered this encounter  Medications  . insulin aspart (NOVOLOG FLEXPEN) 100 UNIT/ML FlexPen    Sig: Inject 10 Units into the skin 3 (three) times daily with meals.    Dispense:  15 mL    Refill:  1  . Insulin Glargine (LANTUS SOLOSTAR) 100 UNIT/ML Solostar Pen    Sig: Inject 25 Units into the skin daily at 10 pm.    Dispense:  15 mL    Refill:  2    Immunization History  Administered Date(s) Administered  . Influenza Split 10/05/2012  . Influenza Whole 01/27/2010  . Influenza,inj,Quad PF,36+ Mos 12/20/2014  . Pneumococcal Polysaccharide-23 01/27/2010, 06/07/2015  . Tdap 10/26/2011, 10/05/2012, 06/27/2014    Family History  Problem Relation Age of Onset  . Diabetes Mother   . Diabetes Father   . Diabetes Brother     History  Substance Use Topics  . Smoking status: Never  Smoker   . Smokeless tobacco: Never Used  . Alcohol Use: No    Review of Systems   As noted in HPI  Filed Vitals:   07/04/15 1517  BP: 101/64  Pulse: 94  Temp: 98 F (36.7 C)  Resp: 16    Physical  Exam  Physical Exam  Constitutional: No distress.  Patient is a wheelchair, left BK  Eyes: EOM are normal. Pupils are equal, round, and reactive to light.  Cardiovascular: Normal rate and regular rhythm.   Pulmonary/Chest: Breath sounds normal. No respiratory distress. She has no wheezes. She has no rales.  Abdominal: Soft. There is no tenderness.    Labs   Lab Results  Component Value Date   WBC 7.1 06/26/2015   HGB 9.3* 06/26/2015   HCT 29.2* 06/26/2015   PLT 395 06/26/2015   GLUCOSE 232* 06/28/2015   CHOL 209* 12/20/2014   TRIG 184* 12/20/2014   HDL 57 12/20/2014   LDLCALC 115* 12/20/2014   ALT 8* 06/26/2015   AST 17 06/26/2015   NA 138 06/28/2015   K 3.6 06/28/2015   CL 104 06/28/2015   CREATININE 0.46 06/28/2015   BUN 6 06/28/2015   CO2 29 06/28/2015   TSH 1.854 12/20/2014   INR 1.43 06/02/2015   HGBA1C 11.7* 06/01/2015   MICROALBUR 2.49* 10/05/2012    Lab Results  Component Value Date   HGBA1C 11.7* 06/01/2015   HGBA1C 12.6* 03/20/2015   HGBA1C 13.0 12/20/2014     Assessment and Plan  Other specified diabetes mellitus without complications - Plan:  Results for orders placed or performed in visit on 07/04/15  Glucose (CBG)  Result Value Ref Range   POC Glucose 139.0 (A) 70 - 99 mg/dl   Diabetes is still uncontrolled, recent hemoglobin A1c was 11.7%, I have advised patient for diabetes meal planning, increase Lantus to 25 units each bedtime, continue NovoLog, keep the fingerstick log, recheck A1c in 3 months.  insulin aspart (NOVOLOG FLEXPEN) 100 UNIT/ML FlexPen, Insulin Glargine (LANTUS SOLOSTAR) 100 UNIT/ML Solostar Pen, COMPLETE METABOLIC PANEL WITH GFR  S/P unilateral BKA (below knee amputation), left Patient is a scheduled to follow with  orthopedics  Vitamin D deficiency Patient will start taking over-the-counter vitamin D 2000 units daily   Return in about 3 months (around 10/04/2015), or if symptoms worsen or fail to improve.   This note has been created with Surveyor, quantity. Any transcriptional errors are unintentional.    Lorayne Marek, MD

## 2015-07-15 DIAGNOSIS — Z8540 Personal history of malignant neoplasm of unspecified female genital organ: Secondary | ICD-10-CM

## 2015-07-15 DIAGNOSIS — D649 Anemia, unspecified: Secondary | ICD-10-CM

## 2015-07-15 DIAGNOSIS — Z794 Long term (current) use of insulin: Secondary | ICD-10-CM

## 2015-07-15 DIAGNOSIS — E1165 Type 2 diabetes mellitus with hyperglycemia: Secondary | ICD-10-CM

## 2015-07-15 DIAGNOSIS — E1152 Type 2 diabetes mellitus with diabetic peripheral angiopathy with gangrene: Secondary | ICD-10-CM

## 2015-07-15 DIAGNOSIS — Z4781 Encounter for orthopedic aftercare following surgical amputation: Secondary | ICD-10-CM

## 2015-07-15 DIAGNOSIS — Z792 Long term (current) use of antibiotics: Secondary | ICD-10-CM

## 2015-07-15 DIAGNOSIS — Z89432 Acquired absence of left foot: Secondary | ICD-10-CM

## 2015-07-29 IMAGING — CR DG FOOT COMPLETE 3+V*L*
3 series · 3 of 3 positions shown · non-contrast
Comparison: 01/02/2014

CLINICAL DATA: Toe infection with ulcer on the midfoot.

EXAM:
LEFT FOOT - COMPLETE 3+ VIEW

[x foot ap left]
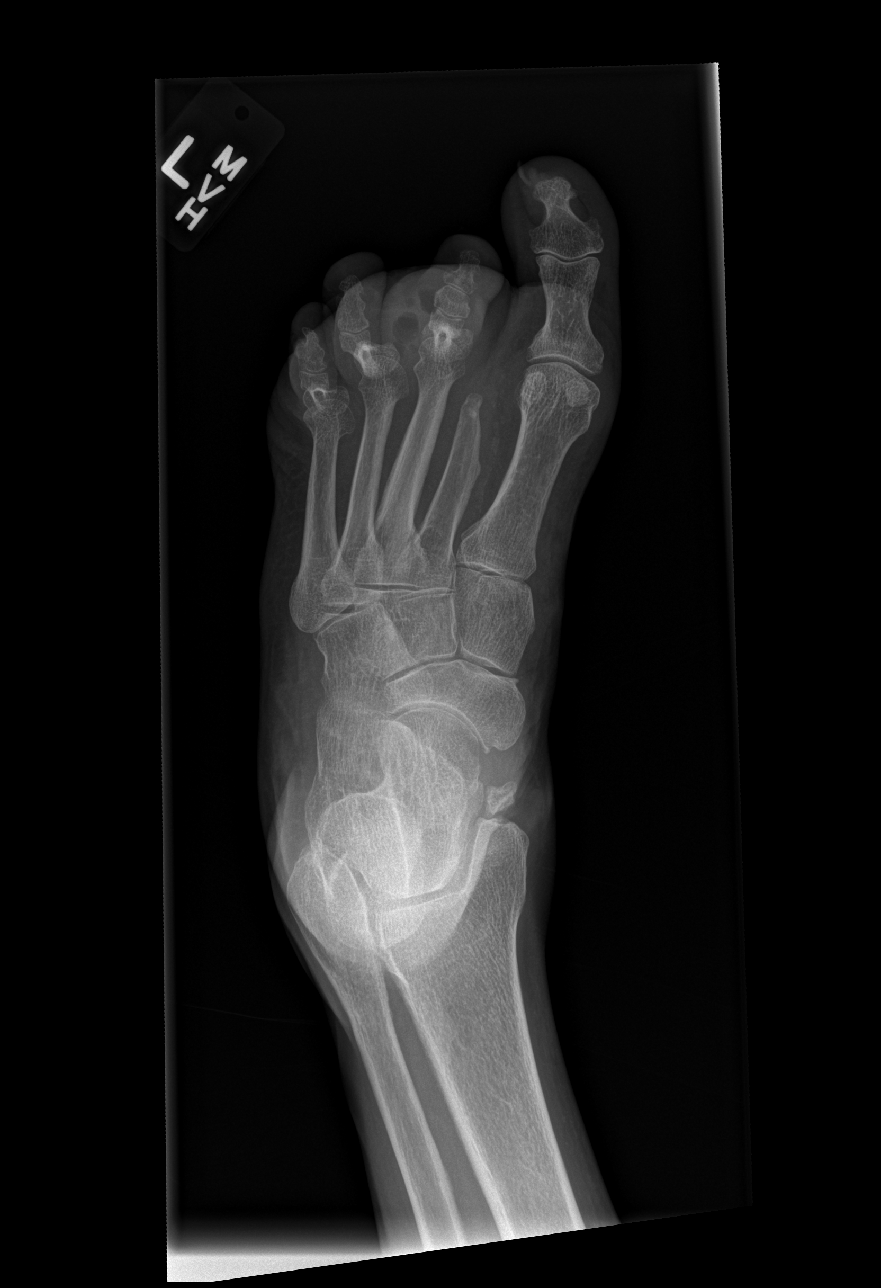

[x foot obl left]
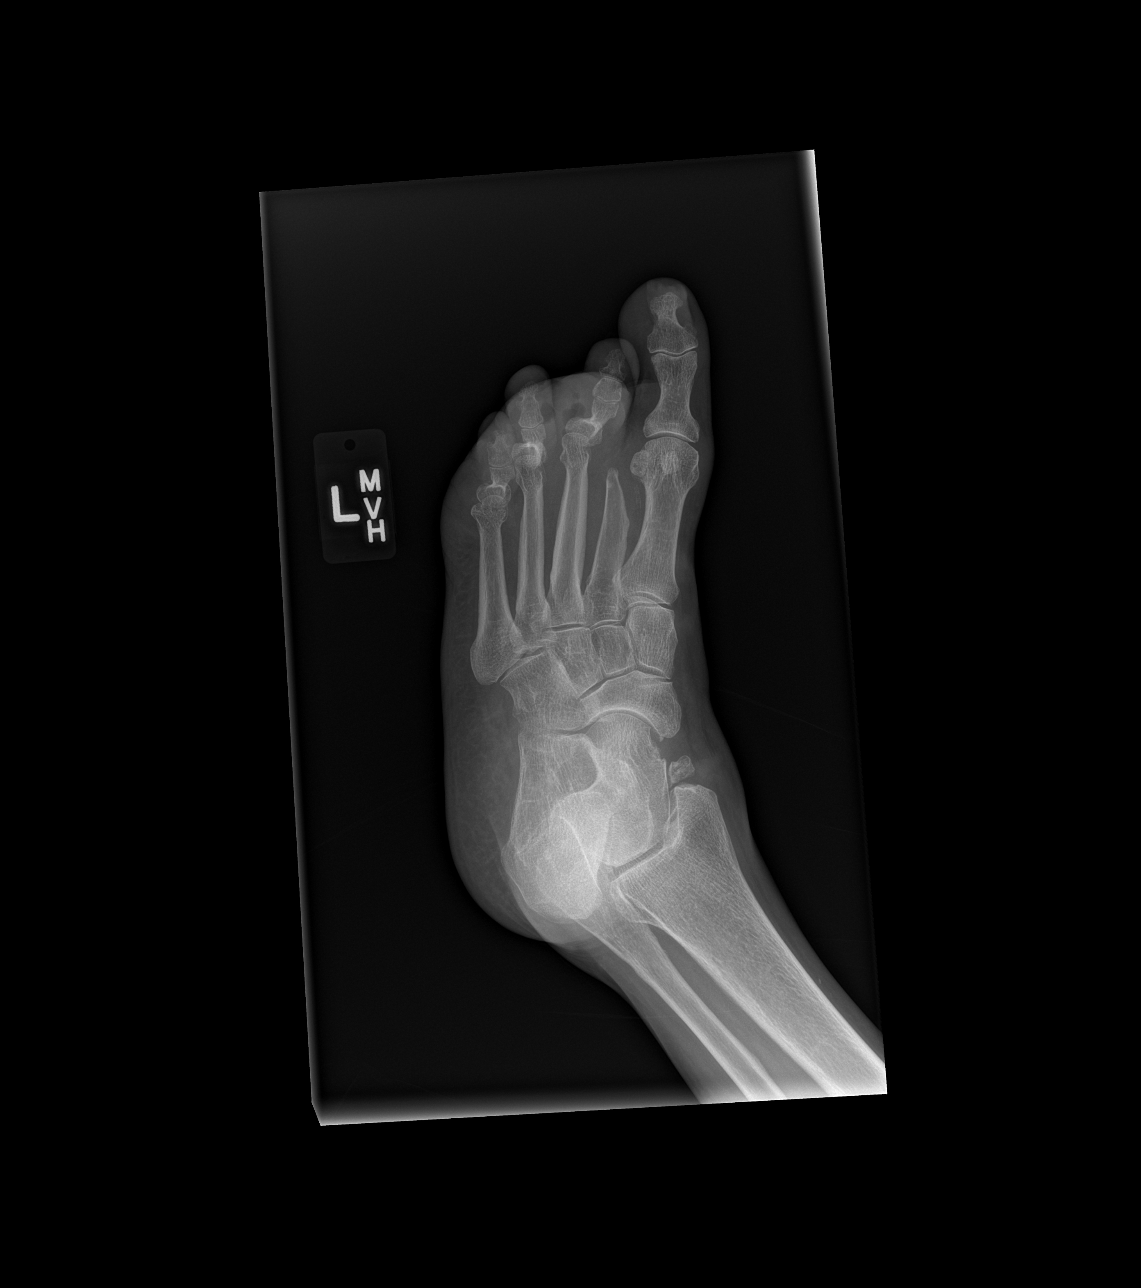

[x foot lat left]
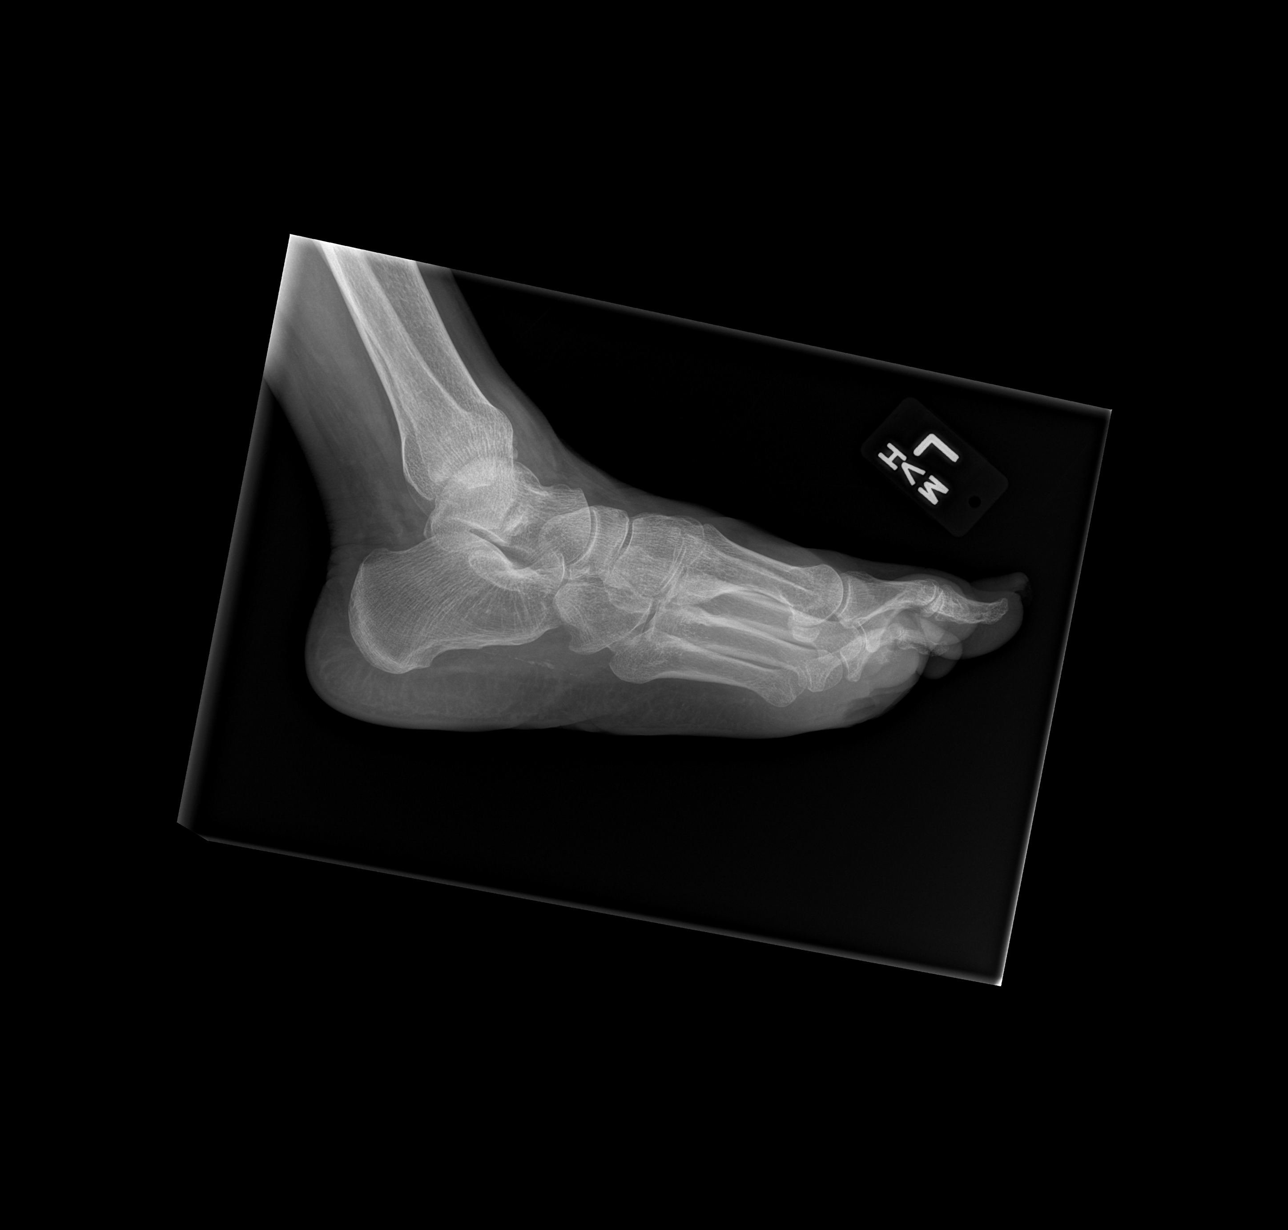

[3 of 3 positions shown; findings below may reference images not displayed]

FINDINGS: There is an ulcer to the plantar forefoot at the MTP level with soft
tissue gas in the third and fourth digits. These digits are
displaced laterally by soft tissue swelling. There is no evidence of
septic arthritis or acute osteomyelitis.

Remote transmetatarsal second toe amputation.

Ossicle or remote fracture to the medial malleolus.
IMPRESSION: Plantar forefoot ulcer with soft tissue gas in the third and fourth
digits. No evidence of osteomyelitis.

## 2015-08-01 ENCOUNTER — Encounter: Payer: Self-pay | Admitting: Internal Medicine

## 2015-08-01 ENCOUNTER — Ambulatory Visit (INDEPENDENT_AMBULATORY_CARE_PROVIDER_SITE_OTHER): Payer: Self-pay | Admitting: Internal Medicine

## 2015-08-01 VITALS — BP 111/69 | HR 97 | Temp 98.5°F

## 2015-08-01 DIAGNOSIS — Z23 Encounter for immunization: Secondary | ICD-10-CM

## 2015-08-01 DIAGNOSIS — A4101 Sepsis due to Methicillin susceptible Staphylococcus aureus: Secondary | ICD-10-CM

## 2015-08-01 NOTE — Assessment & Plan Note (Signed)
It appears that her bacteremia and foot infection have been cured with a combination of left BKA and 4 weeks of IV antibiotics. I will continue observation off of antibiotics. She can follow-up here as needed.

## 2015-08-01 NOTE — Addendum Note (Signed)
Addended by: Lorne Skeens D on: 08/01/2015 11:51 AM   Modules accepted: Orders
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
## 2015-08-02 ENCOUNTER — Encounter: Payer: Self-pay | Admitting: Physical Medicine & Rehabilitation

## 2015-08-07 ENCOUNTER — Encounter: Payer: Self-pay | Attending: Physical Medicine & Rehabilitation | Admitting: Physical Medicine & Rehabilitation

## 2015-08-07 ENCOUNTER — Encounter: Payer: Self-pay | Admitting: Physical Medicine & Rehabilitation

## 2015-08-07 VITALS — BP 128/69 | HR 98

## 2015-08-07 DIAGNOSIS — D649 Anemia, unspecified: Secondary | ICD-10-CM | POA: Insufficient documentation

## 2015-08-07 DIAGNOSIS — M868X8 Other osteomyelitis, other site: Secondary | ICD-10-CM | POA: Insufficient documentation

## 2015-08-07 DIAGNOSIS — H269 Unspecified cataract: Secondary | ICD-10-CM | POA: Insufficient documentation

## 2015-08-07 DIAGNOSIS — Z9889 Other specified postprocedural states: Secondary | ICD-10-CM | POA: Insufficient documentation

## 2015-08-07 DIAGNOSIS — Z8589 Personal history of malignant neoplasm of other organs and systems: Secondary | ICD-10-CM | POA: Insufficient documentation

## 2015-08-07 DIAGNOSIS — E119 Type 2 diabetes mellitus without complications: Secondary | ICD-10-CM | POA: Insufficient documentation

## 2015-08-07 DIAGNOSIS — Z89512 Acquired absence of left leg below knee: Secondary | ICD-10-CM | POA: Insufficient documentation

## 2015-08-07 NOTE — Patient Instructions (Signed)
PLEASE FOLLOW UP WITH THE PROSTHETIST ABOUT AN ARTIFICIAL LEG.   WEAR THE ACE WRAP EVERY DAY TO HELP WITH SHAPING YOUR LEG.  THE PROSTHETIST SHOULD GIVE YOU MORE WRAPS TO USE.

## 2015-08-07 NOTE — Progress Notes (Signed)
Subjective:    Patient ID: [name], female    DOB: 1966-11-22, 49 y.o.   MRN: [id number]  HPI   [name] is here in follow up of her left BKA. She had some complications with the limb after a fall requiring reclosure and abx over the summer. The wound has since closed. She is wearing a dry dressing only. She is not using a shrinker or ACE.   She denies pain. She is walking up to a block with her walker hopping on the right leg.    Pain Inventory Average Pain 0 Pain Right Now 0 My pain is no pain  In the last 24 hours, has pain interfered with the following? General activity 0 Relation with others 0 Enjoyment of life 0 What TIME of day is your pain at its worst? no pain Sleep (in general) NA  Pain is worse with: no pain Pain improves with: no pain Relief from Meds: no pain  Mobility use a wheelchair needs help with transfers  Function I need assistance with the following:  bathing, meal prep, household duties and shopping  Neuro/Psych weakness tingling trouble walking dizziness  Prior Studies Any changes since last visit?  no  Physicians involved in your care Any changes since last visit?  no   Family History  Problem Relation Age of Onset  . Diabetes Mother   . Diabetes Father   . Diabetes Brother    Social History   Social History  . Marital Status: Married    Spouse Name: N/A  . Number of Children: N/A  . Years of Education: N/A   Social History Main Topics  . Smoking status: Never Smoker   . Smokeless tobacco: Never Used  . Alcohol Use: No  . Drug Use: No  . Sexual Activity: Yes   Other Topics Concern  . None   Social History Narrative   ** Merged History Encounter **       Past Surgical History  Procedure Laterality Date  . Radical wide local excision of the vulva and right nguinal lymph node dissection  10/2003    Dr. Fermin Schwab  . Uterine dilatation and currettage    . Labial mass excision  07/2003      Dr. Ree Edman  . Left second toe mtp joint amputation  12/19/2010    Dr. Mayer Camel  . Irrigation and debridement of left foot with removal of left  01/29/2011    Dr. Mayer Camel  . Tubal ligation    . I&d extremity Left 06/02/2015    Procedure: IRRIGATION AND DEBRIDEMENT EXTREMITY;  Surgeon: Leandrew Koyanagi, MD;  Location[facility];  Service: Orthopedics;  Laterality: Left;  . Amputation Left 06/02/2015    Procedure: Partial Left  AMPUTATION FOOT;  Surgeon: Leandrew Koyanagi, MD;  [facility];  Service: Orthopedics;  Laterality: Left;  . I&d extremity Left 06/05/2015    Procedure: IRRIGATION AND DEBRIDEMENT LEFT LEG, POSSIBLE CLOSURE BELOW KNEE AMPUTATION;  Surgeon: Leandrew Koyanagi, MD;  Location: [place];  Service: Orthopedics;  Laterality: Left;  . Amputation Left 06/03/2015    Procedure: Left Transtibial amputation,application of wound vac;  Surgeon: Leandrew Koyanagi, MD;  [facility];  Service: Orthopedics;  Laterality: Left;  . I&d extremity Left 06/08/2015    Procedure: IRRIGATION AND DEBRIDEMENT LEFT LEG, WITH  CLOSURE OF BKA;  Surgeon: Leandrew Koyanagi, MD;  Location: [place];  Service: Orthopedics;  Laterality: Left;  . Tee without cardioversion N/A 06/11/2015  Procedure: TRANSESOPHAGEAL ECHOCARDIOGRAM (TEE);  Surgeon: Lelon Perla, MD;  Location: [facility];  Service: Cardiovascular;  Laterality: N/A;  . Amputation Left 06/26/2015    Procedure: AMPUTATION BELOW KNEE STUMP RECLOSURE ;  Surgeon: Marybelle Killings, MD;  Location: [place];  Service: Orthopedics;  Laterality: Left;   Past Medical History  Diagnosis Date  . Diabetes mellitus   . 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 Janie Morning), Cysto performed by Dr. Jonna Munro 12/2009 with evac of clots and bx and fulguration. // H/O stage 2 SCC of the vulva  . Anemia     2/2 blood  loss  . Diabetes mellitus type 2, uncontrolled DX: 2001  . 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 Diabetic foot ulcers   . Cataract   . Diabetes mellitus without complication    BP 629/47 mmHg  Pulse 98  SpO2 98%  LMP 05/12/2013  Opioid Risk Score:   Fall Risk Score:  `1  Depression screen PHQ 2/9  Depression screen [facility] 2/9 08/07/2015 08/01/2015 07/01/2015 02/19/2011  Decreased Interest 0 0 0 0  Down, Depressed, Hopeless 0 0 0 0  PHQ - 2 Score 0 0 0 0  Altered sleeping 0 - - -  Tired, decreased energy 0 - - -  Change in appetite 0 - - -  Feeling bad or failure about yourself  0 - - -  Trouble concentrating 0 - - -  Moving slowly or fidgety/restless 0 - - -  Suicidal thoughts 0 - - -  PHQ-9 Score 0 - - -    Review of Systems  Musculoskeletal: Positive for gait problem.  Neurological: Positive for dizziness and weakness.       Tingling  All other systems reviewed and are negative.      Objective:   Physical Exam   HENT: Head: Normocephalic and atraumatic.  Eyes: Conjunctivae are normal. Pupils are equal, round, and reactive to light.  Neck: Normal range of motion. Neck supple.  Cardiovascular: Normal rate and regular rhythm. no murmur Respiratory: Effort normal and breath sounds normal. No respiratory distress. She has no wheezes.  GI: Soft. Bowel sounds are normal. She exhibits no distension.  Musculoskeletal: She exhibits no tenderness.  L-BKA dry with scab--fairly well shaped limb. Neurological: She is alert and oriented to person, place, and time.  NUE 5/5. RLE 5/5 HF,KE and 5/5 adf/ LLE: HF 4/5, KE 4/5. No sensory deficits.  Skin: Skin is warm and dry.  Psychiatric: She has a normal mood and affect. Her speech is normal and behavior is normal.   Assessment/Plan: 1. Functional deficits secondary to L-BKA  -she is a k3 ambulator  -still requires a bit more healing and complete closure before prosthetic  fitting  -made a referral to [facility]---will need financial aid for prosthesis  -educated her on dressing/ACE wrap application  -would not do anything special for wound. Would let scab come off on its own  -she's not having any pain  Follow up with me prn. Fifteen minutes of face to face patient care time were spent during this visit. All questions were encouraged and answered.

## 2015-09-02 ENCOUNTER — Encounter (HOSPITAL_COMMUNITY): Payer: Self-pay | Admitting: *Deleted

## 2015-09-02 ENCOUNTER — Inpatient Hospital Stay (HOSPITAL_COMMUNITY)
Admission: EM | Admit: 2015-09-02 | Discharge: 2015-09-07 | DRG: 464 | Disposition: A | Discharge status: Home Health | Payer: Medicaid Other | Attending: Internal Medicine | Admitting: Internal Medicine

## 2015-09-02 DIAGNOSIS — N179 Acute kidney failure, unspecified: Secondary | ICD-10-CM

## 2015-09-02 DIAGNOSIS — R6521 Severe sepsis with septic shock: Secondary | ICD-10-CM

## 2015-09-02 DIAGNOSIS — Z792 Long term (current) use of antibiotics: Secondary | ICD-10-CM

## 2015-09-02 DIAGNOSIS — D62 Acute posthemorrhagic anemia: Secondary | ICD-10-CM | POA: Diagnosis not present

## 2015-09-02 DIAGNOSIS — D649 Anemia, unspecified: Secondary | ICD-10-CM

## 2015-09-02 DIAGNOSIS — Y835 Amputation of limb(s) as the cause of abnormal reaction of the patient, or of later complication, without mention of misadventure at the time of the procedure: Secondary | ICD-10-CM | POA: Diagnosis present

## 2015-09-02 DIAGNOSIS — E10621 Type 1 diabetes mellitus with foot ulcer: Secondary | ICD-10-CM

## 2015-09-02 DIAGNOSIS — T8744 Infection of amputation stump, left lower extremity: Principal | ICD-10-CM

## 2015-09-02 DIAGNOSIS — E119 Type 2 diabetes mellitus without complications: Secondary | ICD-10-CM

## 2015-09-02 DIAGNOSIS — C52 Malignant neoplasm of vagina: Secondary | ICD-10-CM

## 2015-09-02 DIAGNOSIS — A4101 Sepsis due to Methicillin susceptible Staphylococcus aureus: Secondary | ICD-10-CM

## 2015-09-02 DIAGNOSIS — T8781 Dehiscence of amputation stump: Secondary | ICD-10-CM | POA: Diagnosis present

## 2015-09-02 DIAGNOSIS — L0291 Cutaneous abscess, unspecified: Secondary | ICD-10-CM

## 2015-09-02 DIAGNOSIS — K859 Acute pancreatitis without necrosis or infection, unspecified: Secondary | ICD-10-CM | POA: Diagnosis present

## 2015-09-02 DIAGNOSIS — A419 Sepsis, unspecified organism: Secondary | ICD-10-CM

## 2015-09-02 DIAGNOSIS — E1152 Type 2 diabetes mellitus with diabetic peripheral angiopathy with gangrene: Secondary | ICD-10-CM

## 2015-09-02 DIAGNOSIS — T8130XA Disruption of wound, unspecified, initial encounter: Secondary | ICD-10-CM | POA: Diagnosis present

## 2015-09-02 DIAGNOSIS — Z794 Long term (current) use of insulin: Secondary | ICD-10-CM

## 2015-09-02 DIAGNOSIS — Z79899 Other long term (current) drug therapy: Secondary | ICD-10-CM

## 2015-09-02 DIAGNOSIS — M869 Osteomyelitis, unspecified: Secondary | ICD-10-CM | POA: Diagnosis present

## 2015-09-02 DIAGNOSIS — Z89512 Acquired absence of left leg below knee: Secondary | ICD-10-CM

## 2015-09-02 DIAGNOSIS — B9561 Methicillin susceptible Staphylococcus aureus infection as the cause of diseases classified elsewhere: Secondary | ICD-10-CM

## 2015-09-02 DIAGNOSIS — R7881 Bacteremia: Secondary | ICD-10-CM

## 2015-09-02 DIAGNOSIS — D638 Anemia in other chronic diseases classified elsewhere: Secondary | ICD-10-CM

## 2015-09-02 DIAGNOSIS — E11628 Type 2 diabetes mellitus with other skin complications: Secondary | ICD-10-CM | POA: Diagnosis present

## 2015-09-02 DIAGNOSIS — T8740 Infection of amputation stump, unspecified extremity: Secondary | ICD-10-CM

## 2015-09-02 DIAGNOSIS — I96 Gangrene, not elsewhere classified: Secondary | ICD-10-CM

## 2015-09-02 DIAGNOSIS — T8131XD Disruption of external operation (surgical) wound, not elsewhere classified, subsequent encounter: Secondary | ICD-10-CM

## 2015-09-02 DIAGNOSIS — R6520 Severe sepsis without septic shock: Secondary | ICD-10-CM

## 2015-09-02 DIAGNOSIS — E1165 Type 2 diabetes mellitus with hyperglycemia: Secondary | ICD-10-CM | POA: Diagnosis present

## 2015-09-02 DIAGNOSIS — L03032 Cellulitis of left toe: Secondary | ICD-10-CM

## 2015-09-02 DIAGNOSIS — Z8544 Personal history of malignant neoplasm of other female genital organs: Secondary | ICD-10-CM

## 2015-09-02 DIAGNOSIS — A4901 Methicillin susceptible Staphylococcus aureus infection, unspecified site: Secondary | ICD-10-CM | POA: Insufficient documentation

## 2015-09-02 DIAGNOSIS — J9600 Acute respiratory failure, unspecified whether with hypoxia or hypercapnia: Secondary | ICD-10-CM

## 2015-09-02 DIAGNOSIS — L97529 Non-pressure chronic ulcer of other part of left foot with unspecified severity: Secondary | ICD-10-CM

## 2015-09-02 LAB — CBC
HCT: 34.6 % — ABNORMAL LOW (ref 36.0–46.0)
Hemoglobin: 11.6 g/dL — ABNORMAL LOW (ref 12.0–15.0)
MCH: 29.3 pg (ref 26.0–34.0)
MCHC: 33.5 g/dL (ref 30.0–36.0)
MCV: 87.4 fL (ref 78.0–100.0)
PLATELETS: 339 10*3/uL (ref 150–400)
RBC: 3.96 MIL/uL (ref 3.87–5.11)
RDW: 13.1 % (ref 11.5–15.5)
WBC: 8.9 10*3/uL (ref 4.0–10.5)

## 2015-09-02 LAB — BASIC METABOLIC PANEL
Anion gap: 6 (ref 5–15)
BUN: 19 mg/dL (ref 6–20)
CO2: 29 mmol/L (ref 22–32)
CREATININE: 1.04 mg/dL — AB (ref 0.44–1.00)
Calcium: 8.6 mg/dL — ABNORMAL LOW (ref 8.9–10.3)
Chloride: 98 mmol/L — ABNORMAL LOW (ref 101–111)
GFR calc Af Amer: >60 mL/min (ref >60)
GFR calc non Af Amer: >60 mL/min (ref >60)
Glucose, Bld: 305 mg/dL — ABNORMAL HIGH (ref 65–99)
Potassium: 3.8 mmol/L (ref 3.5–5.1)
SODIUM: 133 mmol/L — AB (ref 135–145)

## 2015-09-02 NOTE — ED Notes (Signed)
Pt reports having left below the knee amputation in July, now has small wound and yellow discharge to incision area. Denies fever.

## 2015-09-02 NOTE — ED Provider Notes (Addendum)
CSN: [id number]   Arrival date & time 09/02/15 1814  History  By signing my name below, I, [name], attest that this documentation has been prepared under the direction and in the presence of Varney Biles, MD. Electronically Signed: [name], ED Scribe. 09/03/2015. 1:07 AM.  Chief Complaint  Patient presents with  . Wound Infection    HPI HPI Comments: She completed 4 weeks of IV cefazolin for her MSSA bacteremia and diabetic foot infection on 07/01/2015.  The history is provided by the patient. A language interpreter was used ([id number]).   [name] is a 49 y.o. female with history of DM and osteomyelitis s/p left BKA who presents to the Emergency Department complaining of purulent drainage from the site of a left BKA with onset yesterday.  Pt denies increased pain at the site, nausea, vomiting, fever, and chills. She has had 1 nurse home visit since being discharged from the hospital and is not following up with a doctor in the clinic. 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    Past Medical History  Diagnosis Date  . Diabetes mellitus   . 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 Janie Morning), Cysto performed by Dr. Jonna Munro 12/2009 with evac of clots and bx and fulguration. // H/O stage 2 SCC of the vulva  . Anemia     2/2 blood loss  . Diabetes mellitus type 2, uncontrolled ([place]) DX: 2001  . Osteomyelitis ([place])     S/P removal 2nd MT head 01/29/11 - CX showing MSSA and GBS  . Diabetic foot ulcers ([place])   . Cataract   . Diabetes mellitus without complication [facility])     Past Surgical History  Procedure Laterality Date  . Radical wide local excision of the vulva and right nguinal lymph node dissection  10/2003    Dr. Fermin Schwab  . Uterine dilatation  and currettage    . Labial mass excision  07/2003    Dr. Ree Edman  . Left second toe mtp joint amputation  12/19/2010    Dr. Mayer Camel  . Irrigation and debridement of left foot with removal of left  01/29/2011    Dr. Mayer Camel  . Tubal ligation    . I&d extremity Left 06/02/2015    Procedure: IRRIGATION AND DEBRIDEMENT EXTREMITY;  Surgeon: Leandrew Koyanagi, MD;  Location: [facility];  Service: Orthopedics;  Laterality: Left;  . Amputation Left 06/02/2015    Procedure: Partial Left  AMPUTATION FOOT;  Surgeon: Leandrew Koyanagi, MD;  Location: [facility];  Service: Orthopedics;  Laterality: Left;  . I&d extremity Left 06/05/2015    Procedure: IRRIGATION AND DEBRIDEMENT LEFT LEG, POSSIBLE CLOSURE BELOW KNEE AMPUTATION;  Surgeon: Leandrew Koyanagi, MD;  Location: [facility];  Service: Orthopedics;  Laterality: Left;  . Amputation Left 06/03/2015    Procedure: Left Transtibial amputation,application of wound vac;  Surgeon: Leandrew Koyanagi, MD;  Location: [facility];  Service: Orthopedics;  Laterality: Left;  . I&d extremity Left 06/08/2015    Procedure: IRRIGATION AND DEBRIDEMENT LEFT LEG, WITH  CLOSURE OF BKA;  Surgeon: Leandrew Koyanagi, MD;  Location: [facility];  Service: Orthopedics;  Laterality: Left;  . Tee without cardioversion N/A 06/11/2015    Procedure: TRANSESOPHAGEAL ECHOCARDIOGRAM (TEE);  Surgeon: Lelon Perla, MD;  Location: [facility];  Service:  Cardiovascular;  Laterality: N/A;  . Amputation Left 06/26/2015    Procedure: AMPUTATION BELOW KNEE STUMP RECLOSURE ;  Surgeon: Marybelle Killings, MD;  Location: [facility];  Service: Orthopedics;  Laterality: Left;    Family History  Problem Relation Age of Onset  . Diabetes Mother   . Diabetes Father   . Diabetes Brother     Social History  Substance Use Topics  . Smoking status: Never Smoker   . Smokeless tobacco: Never Used  . Alcohol Use: No     Review of Systems  Constitutional: Negative for fever and chills.  Gastrointestinal: Negative for nausea and vomiting.  Musculoskeletal:        Purulent drainage from left BKA stump  Neurological: Negative for weakness.  All other systems reviewed and are negative.  Home Medications   Prior to Admission medications   Medication Sig Start Date End Date Taking? Authorizing Provider  acetaminophen (TYLENOL) 325 MG tablet Take 2 tablets (650 mg total) by mouth every 6 (six) hours as needed for mild pain or moderate pain. 06/18/15   Bary Leriche, PA-C  Blood Glucose Monitoring Suppl (TRUE METRIX METER) DEVI 1 each by Does not apply route 3 (three) times daily. 07/02/15   Lorayne Marek, MD  glucose blood (TRUE METRIX BLOOD GLUCOSE TEST) test strip USE TO TEST BLOOD SUGAR 3 TIMES DAILY 07/02/15   Lorayne Marek, MD  insulin aspart (NOVOLOG FLEXPEN) 100 UNIT/ML FlexPen Inject 10 Units into the skin 3 (three) times daily with meals. 07/04/15   Lorayne Marek, MD  Insulin Glargine (LANTUS SOLOSTAR) 100 UNIT/ML Solostar Pen Inject 25 Units into the skin daily at 10 pm. 07/04/15   Lorayne Marek, MD  Insulin Pen Needle 32G X 4 MM MISC 1 application by Does not apply route 4 (four) times daily -  with meals and at bedtime. 06/18/15   Bary Leriche, PA-C  iron polysaccharides (NIFEREX) 150 MG capsule Take 1 capsule (150 mg total) by mouth 2 times daily at 12 noon and 4 pm. 06/18/15   Bary Leriche, PA-C  Multiple Vitamin (MULTIVITAMIN WITH MINERALS) TABS tablet Take 1 tablet by mouth daily. 06/18/15   Bary Leriche, PA-C  polycarbophil (FIBERCON) 625 MG tablet Take 1 tablet (625 mg total) by mouth 2 (two) times daily. 06/18/15   Bary Leriche, PA-C  simethicone (MYLICON) 80 MG chewable tablet Chew 1 tablet (80 mg total) by mouth 4 (four) times daily as needed for flatulence (for gas and bloating). 06/18/15   Bary Leriche, PA-C  traMADol (ULTRAM) 50 MG tablet Take 0.5-1 tablets (25-50 mg total) by mouth every 6 (six) hours as needed for severe pain. 06/28/15   Lanae Crumbly, PA-C  TRUEPLUS LANCETS 28G MISC USE TO TEST BLOOD SUGAR 3 TIMES A DAY 07/02/15   Lorayne Marek,  MD    Allergies  Review of patient's allergies indicates no known allergies.  Triage Vitals: BP 122/82 mmHg  Pulse 95  Temp(Src) 99.4 F (37.4 C) (Oral)  Resp 16  SpO2 99%  LMP 05/12/2013  Physical Exam  Constitutional: She is oriented to person, place, and time. She appears well-developed and well-nourished. No distress.  HENT:  Head: Normocephalic and atraumatic.  Neck: Normal range of motion.  Cardiovascular: Tachycardia present.   Pulmonary/Chest: Effort normal. No respiratory distress.  CTAB  Musculoskeletal: Normal range of motion.  LLE: pt has BKA.Pt has some mild erythema at the stump with no concrete fluctuance. +TTP. Pt has purulent drainage at  one spot.   Neurological: She is alert and oriented to person, place, and time.  Skin: Skin is warm and dry. She is not diaphoretic.  Psychiatric: She has a normal mood and affect.  Nursing note and vitals reviewed.        ED Course  Procedures   DIAGNOSTIC STUDIES: Oxygen Saturation is 99% on RA, normal by my interpretation.    COORDINATION OF CARE: 12:04 AM Discussed treatment plan which includes XR, labs, and IVF with pt at bedside and pt agreed to plan.  1:03 AM D/w Dr. Sharol Given (Orthopedics) who would like the pt started on abx and admitted to his service.   1:25 AM D/w Dr. Alcario Drought (Hospitalist) at the bedside. He would like the pt admitted to his service.   Labs Reviewed  BASIC METABOLIC PANEL - Abnormal; Notable for the following:    Sodium 133 (*)    Chloride 98 (*)    Glucose, Bld 305 (*)    Creatinine, Ser 1.04 (*)    Calcium 8.6 (*)    All other components within normal limits  CBC - Abnormal; Notable for the following:    Hemoglobin 11.6 (*)    HCT 34.6 (*)    All other components within normal limits  CULTURE, BLOOD (ROUTINE X 2)  CULTURE, BLOOD (ROUTINE X 2)  WOUND CULTURE  C-REACTIVE PROTEIN  CBC  BASIC METABOLIC PANEL    Imaging Review Dg Tibia/fibula Left  09/03/2015   CLINICAL DATA:   Purulent drainage at amputation stump.  EXAM: LEFT TIBIA AND FIBULA - 2 VIEW  COMPARISON:  06/26/2015  FINDINGS: No soft tissue gas is evident. No bony destruction is evident. There is no radiopaque foreign body.  IMPRESSION: Negative for soft tissue gas or bony destruction. MRI will be more sensitive and specific for osteomyelitis if additional imaging is clinically warranted.   Electronically Signed   By: Andreas Newport M.D.   On: 09/03/2015 01:13    I personally reviewed and evaluated these images and lab results as a part of my medical decision-making.   MDM   Final diagnoses:  Abscess  AKI (acute kidney injury) ([place])    I personally performed the services described in this documentation, which was scribed in my presence. The recorded information has been reviewed and is accurate.  Pt with hx of MSSA bacteremia and diabetic foot infection on 07/01/2015 s/p L sided BKA and 4 weeks of antibiotics comes in with cc of purulent drainage for the last 2 days. It doesn't seem like there is a huge abscess collection based on exam. Given her hx however, ddx includes cellulitis, abscess, osteomyelitis. Spoke with Dr. Sharol Given, he will see the patient tomorrow. Wants antibiotics started now, Medicine is willing to admit -so Ortho effectively will be on consult now.   Varney Biles, MD 09/03/15 [id number]  Varney Biles, MD 09/03/15 [id number]

## 2015-09-03 ENCOUNTER — Emergency Department (HOSPITAL_COMMUNITY): Payer: Medicaid Other

## 2015-09-03 ENCOUNTER — Inpatient Hospital Stay (HOSPITAL_COMMUNITY): Payer: Medicaid Other | Admitting: Certified Registered Nurse Anesthetist

## 2015-09-03 ENCOUNTER — Encounter (HOSPITAL_COMMUNITY)
Admission: EM | Disposition: A | Discharge status: Home Health | Payer: Self-pay | Source: Home / Self Care | Attending: Internal Medicine

## 2015-09-03 ENCOUNTER — Encounter (HOSPITAL_COMMUNITY): Payer: Self-pay | Admitting: Certified Registered Nurse Anesthetist

## 2015-09-03 DIAGNOSIS — D62 Acute posthemorrhagic anemia: Secondary | ICD-10-CM | POA: Diagnosis not present

## 2015-09-03 DIAGNOSIS — Z8544 Personal history of malignant neoplasm of other female genital organs: Secondary | ICD-10-CM | POA: Diagnosis not present

## 2015-09-03 DIAGNOSIS — M869 Osteomyelitis, unspecified: Secondary | ICD-10-CM | POA: Diagnosis present

## 2015-09-03 DIAGNOSIS — Z79899 Other long term (current) drug therapy: Secondary | ICD-10-CM | POA: Diagnosis not present

## 2015-09-03 DIAGNOSIS — E11628 Type 2 diabetes mellitus with other skin complications: Secondary | ICD-10-CM | POA: Diagnosis present

## 2015-09-03 DIAGNOSIS — T8744 Infection of amputation stump, left lower extremity: Secondary | ICD-10-CM | POA: Diagnosis present

## 2015-09-03 DIAGNOSIS — Y835 Amputation of limb(s) as the cause of abnormal reaction of the patient, or of later complication, without mention of misadventure at the time of the procedure: Secondary | ICD-10-CM | POA: Diagnosis present

## 2015-09-03 DIAGNOSIS — K859 Acute pancreatitis without necrosis or infection, unspecified: Secondary | ICD-10-CM | POA: Diagnosis present

## 2015-09-03 DIAGNOSIS — E1165 Type 2 diabetes mellitus with hyperglycemia: Secondary | ICD-10-CM | POA: Diagnosis present

## 2015-09-03 DIAGNOSIS — Z794 Long term (current) use of insulin: Secondary | ICD-10-CM

## 2015-09-03 DIAGNOSIS — Z792 Long term (current) use of antibiotics: Secondary | ICD-10-CM | POA: Diagnosis not present

## 2015-09-03 DIAGNOSIS — N179 Acute kidney failure, unspecified: Secondary | ICD-10-CM | POA: Diagnosis present

## 2015-09-03 DIAGNOSIS — T8131XD Disruption of external operation (surgical) wound, not elsewhere classified, subsequent encounter: Secondary | ICD-10-CM

## 2015-09-03 DIAGNOSIS — E1152 Type 2 diabetes mellitus with diabetic peripheral angiopathy with gangrene: Secondary | ICD-10-CM

## 2015-09-03 DIAGNOSIS — T8781 Dehiscence of amputation stump: Secondary | ICD-10-CM | POA: Diagnosis present

## 2015-09-03 DIAGNOSIS — Z89512 Acquired absence of left leg below knee: Secondary | ICD-10-CM | POA: Diagnosis not present

## 2015-09-03 DIAGNOSIS — L0291 Cutaneous abscess, unspecified: Secondary | ICD-10-CM | POA: Diagnosis present

## 2015-09-03 HISTORY — PX: STUMP REVISION: SHX6102

## 2015-09-03 HISTORY — PX: I & D EXTREMITY: SHX5045

## 2015-09-03 LAB — GLUCOSE, CAPILLARY
GLUCOSE-CAPILLARY: 235 mg/dL — AB (ref 65–99)
Glucose-Capillary: 309 mg/dL — ABNORMAL HIGH (ref 65–99)
Glucose-Capillary: 263 mg/dL — ABNORMAL HIGH (ref 65–99)
Glucose-Capillary: 139 mg/dL — ABNORMAL HIGH (ref 65–99)
Glucose-Capillary: 111 mg/dL — ABNORMAL HIGH (ref 65–99)
Glucose-Capillary: 183 mg/dL — ABNORMAL HIGH (ref 65–99)

## 2015-09-03 LAB — CBC
HEMATOCRIT: 31.1 % — AB (ref 36.0–46.0)
Hemoglobin: 10.4 g/dL — ABNORMAL LOW (ref 12.0–15.0)
MCH: 29.6 pg (ref 26.0–34.0)
MCHC: 33.4 g/dL (ref 30.0–36.0)
MCV: 88.6 fL (ref 78.0–100.0)
Platelets: 293 10*3/uL (ref 150–400)
RBC: 3.51 MIL/uL — ABNORMAL LOW (ref 3.87–5.11)
RDW: 13.4 % (ref 11.5–15.5)
WBC: 9 10*3/uL (ref 4.0–10.5)

## 2015-09-03 LAB — C-REACTIVE PROTEIN: CRP: 2.1 mg/dL — AB (ref <1.0)

## 2015-09-03 LAB — CBG MONITORING, ED: Glucose-Capillary: 311 mg/dL — ABNORMAL HIGH (ref 65–99)

## 2015-09-03 LAB — BASIC METABOLIC PANEL
ANION GAP: 7 (ref 5–15)
BUN: 18 mg/dL (ref 6–20)
CO2: 24 mmol/L (ref 22–32)
CREATININE: 0.73 mg/dL (ref 0.44–1.00)
Calcium: 7.9 mg/dL — ABNORMAL LOW (ref 8.9–10.3)
Chloride: 104 mmol/L (ref 101–111)
GFR calc Af Amer: >60 mL/min (ref >60)
GFR calc non Af Amer: >60 mL/min (ref >60)
GLUCOSE: 327 mg/dL — AB (ref 65–99)
Potassium: 3.7 mmol/L (ref 3.5–5.1)
Sodium: 135 mmol/L (ref 135–145)

## 2015-09-03 SURGERY — REVISION, AMPUTATION SITE
Anesthesia: General | Site: Leg Lower | Laterality: Left

## 2015-09-03 MED ORDER — SODIUM CHLORIDE 0.9 % IV SOLN
Freq: Once | INTRAVENOUS | Status: AC
  Administered 2015-09-03: 03:00:00 via INTRAVENOUS

## 2015-09-03 MED ORDER — TRAMADOL HCL 50 MG PO TABS
25.0000 mg | ORAL_TABLET | Freq: Four times a day (QID) | ORAL | Status: DC | PRN
Start: 2015-09-03 — End: 2015-09-07
  Administered 2015-09-04: 50 mg via ORAL
  Filled 2015-09-03: qty 1

## 2015-09-03 MED ORDER — SODIUM CHLORIDE 0.9 % IV SOLN
INTRAVENOUS | Status: DC | PRN
  Administered 2015-09-03: 3000 mL via INTRAMUSCULAR

## 2015-09-03 MED ORDER — PROMETHAZINE HCL 25 MG/ML IJ SOLN
INTRAMUSCULAR | Status: AC
  Filled 2015-09-03: qty 1

## 2015-09-03 MED ORDER — LACTATED RINGERS IV SOLN
INTRAVENOUS | Status: DC | PRN
  Administered 2015-09-03: 1000 mL
  Administered 2015-09-03: 16:00:00 via INTRAVENOUS

## 2015-09-03 MED ORDER — ONDANSETRON HCL 4 MG/2ML IJ SOLN
INTRAMUSCULAR | Status: DC | PRN
  Administered 2015-09-03: 4 mg via INTRAVENOUS

## 2015-09-03 MED ORDER — INSULIN GLARGINE 100 UNIT/ML ~~LOC~~ SOLN
17.0000 [IU] | Freq: Every day | SUBCUTANEOUS | Status: DC
  Administered 2015-09-03 (×2): 17 [IU] via SUBCUTANEOUS
  Filled 2015-09-03 (×3): qty 0.17

## 2015-09-03 MED ORDER — PIPERACILLIN-TAZOBACTAM 3.375 G IVPB 30 MIN
3.3750 g | Freq: Once | INTRAVENOUS | Status: AC
  Administered 2015-09-03: 3.375 g via INTRAVENOUS
  Filled 2015-09-03: qty 50

## 2015-09-03 MED ORDER — VANCOMYCIN HCL 1000 MG IV SOLR
1000.0000 mg | INTRAVENOUS | Status: DC | PRN
  Administered 2015-09-03: 1000 mg via INTRAVENOUS

## 2015-09-03 MED ORDER — LIDOCAINE HCL (CARDIAC) 20 MG/ML IV SOLN
INTRAVENOUS | Status: DC | PRN
  Administered 2015-09-03: 50 mg via INTRAVENOUS

## 2015-09-03 MED ORDER — MEPERIDINE HCL 25 MG/ML IJ SOLN: 6.2500 mg | INTRAMUSCULAR | Status: DC | PRN

## 2015-09-03 MED ORDER — PROPOFOL 10 MG/ML IV BOLUS
INTRAVENOUS | Status: AC
  Filled 2015-09-03: qty 20

## 2015-09-03 MED ORDER — SODIUM CHLORIDE 0.9 % IV BOLUS (SEPSIS)
1000.0000 mL | Freq: Once | INTRAVENOUS | Status: AC
  Administered 2015-09-03: 1000 mL via INTRAVENOUS

## 2015-09-03 MED ORDER — INSULIN ASPART 100 UNIT/ML ~~LOC~~ SOLN
0.0000 [IU] | SUBCUTANEOUS | Status: DC
  Administered 2015-09-03: 5 [IU] via SUBCUTANEOUS
  Administered 2015-09-03: 3 [IU] via SUBCUTANEOUS
  Administered 2015-09-03: 11 [IU] via SUBCUTANEOUS
  Administered 2015-09-03: 8 [IU] via SUBCUTANEOUS
  Administered 2015-09-04 (×2): 5 [IU] via SUBCUTANEOUS
  Administered 2015-09-04: 3 [IU] via SUBCUTANEOUS
  Filled 2015-09-03: qty 1

## 2015-09-03 MED ORDER — 0.9 % SODIUM CHLORIDE (POUR BTL) OPTIME
TOPICAL | Status: DC | PRN
  Administered 2015-09-03: 1000 mL

## 2015-09-03 MED ORDER — EPHEDRINE SULFATE 50 MG/ML IJ SOLN
INTRAMUSCULAR | Status: DC | PRN
  Administered 2015-09-03: 10 mg via INTRAVENOUS

## 2015-09-03 MED ORDER — PROMETHAZINE HCL 25 MG/ML IJ SOLN
6.2500 mg | INTRAMUSCULAR | Status: DC | PRN
  Administered 2015-09-03: 6.25 mg via INTRAVENOUS

## 2015-09-03 MED ORDER — PIPERACILLIN-TAZOBACTAM 3.375 G IVPB
3.3750 g | Freq: Three times a day (TID) | INTRAVENOUS | Status: DC
  Administered 2015-09-03 – 2015-09-05 (×6): 3.375 g via INTRAVENOUS
  Filled 2015-09-03 (×9): qty 50

## 2015-09-03 MED ORDER — VANCOMYCIN HCL IN DEXTROSE 1-5 GM/200ML-% IV SOLN
1000.0000 mg | Freq: Once | INTRAVENOUS | Status: AC
  Administered 2015-09-03: 1000 mg via INTRAVENOUS
  Filled 2015-09-03: qty 200

## 2015-09-03 MED ORDER — FENTANYL CITRATE (PF) 100 MCG/2ML IJ SOLN
INTRAMUSCULAR | Status: DC | PRN
  Administered 2015-09-03: 100 ug via INTRAVENOUS

## 2015-09-03 MED ORDER — MIDAZOLAM HCL 2 MG/2ML IJ SOLN
INTRAMUSCULAR | Status: AC
  Filled 2015-09-03: qty 4

## 2015-09-03 MED ORDER — SODIUM CHLORIDE 0.9 % IJ SOLN
3.0000 mL | Freq: Two times a day (BID) | INTRAMUSCULAR | Status: DC
  Administered 2015-09-03 – 2015-09-06 (×5): 3 mL via INTRAVENOUS

## 2015-09-03 MED ORDER — PHENYLEPHRINE HCL 10 MG/ML IJ SOLN
INTRAMUSCULAR | Status: DC | PRN
  Administered 2015-09-03 (×3): 80 ug via INTRAVENOUS

## 2015-09-03 MED ORDER — MIDAZOLAM HCL 2 MG/2ML IJ SOLN: 0.5000 mg | Freq: Once | INTRAMUSCULAR | Status: DC | PRN

## 2015-09-03 MED ORDER — INSULIN ASPART 100 UNIT/ML FLEXPEN: 10.0000 [IU] | PEN_INJECTOR | Freq: Three times a day (TID) | SUBCUTANEOUS | Status: DC

## 2015-09-03 MED ORDER — VANCOMYCIN HCL IN DEXTROSE 1-5 GM/200ML-% IV SOLN
1000.0000 mg | Freq: Two times a day (BID) | INTRAVENOUS | Status: DC
Start: 2015-09-03 — End: 2015-09-05
  Administered 2015-09-03 – 2015-09-05 (×5): 1000 mg via INTRAVENOUS
  Filled 2015-09-03 (×6): qty 200

## 2015-09-03 MED ORDER — HYDROMORPHONE HCL 1 MG/ML IJ SOLN
0.2500 mg | INTRAMUSCULAR | Status: DC | PRN
  Administered 2015-09-03: 0.25 mg via INTRAVENOUS
  Administered 2015-09-03: 0.5 mg via INTRAVENOUS
  Filled 2015-09-03: qty 1

## 2015-09-03 MED ORDER — HEPARIN SODIUM (PORCINE) 5000 UNIT/ML IJ SOLN: 5000.0000 [IU] | Freq: Three times a day (TID) | INTRAMUSCULAR | Status: DC

## 2015-09-03 MED ORDER — HYDROMORPHONE HCL 1 MG/ML IJ SOLN
INTRAMUSCULAR | Status: AC
  Filled 2015-09-03: qty 1

## 2015-09-03 MED ORDER — INSULIN GLARGINE 100 UNIT/ML SOLOSTAR PEN: 25.0000 [IU] | PEN_INJECTOR | Freq: Every day | SUBCUTANEOUS | Status: DC

## 2015-09-03 MED ORDER — FENTANYL CITRATE (PF) 250 MCG/5ML IJ SOLN
INTRAMUSCULAR | Status: AC
  Filled 2015-09-03: qty 5

## 2015-09-03 MED ORDER — PROPOFOL 10 MG/ML IV BOLUS
INTRAVENOUS | Status: DC | PRN
  Administered 2015-09-03: 140 mg via INTRAVENOUS

## 2015-09-03 SURGICAL SUPPLY — 49 items
BANDAGE ELASTIC 6 VELCRO ST LF (GAUZE/BANDAGES/DRESSINGS) ×2 IMPLANT
BANDAGE ESMARK 6X9 LF (GAUZE/BANDAGES/DRESSINGS) ×2 IMPLANT
BLADE SAW RECIP 87.9 MT (BLADE) ×6 IMPLANT
BNDG CMPR 9X6 STRL LF SNTH (GAUZE/BANDAGES/DRESSINGS) ×2
BNDG COHESIVE 4X5 TAN STRL (GAUZE/BANDAGES/DRESSINGS) ×4 IMPLANT
BNDG COHESIVE 6X5 TAN STRL LF (GAUZE/BANDAGES/DRESSINGS) ×6 IMPLANT
BNDG ESMARK 6X9 LF (GAUZE/BANDAGES/DRESSINGS) ×4
BNDG GAUZE ELAST 4 BULKY (GAUZE/BANDAGES/DRESSINGS) ×10 IMPLANT
COVER SURGICAL LIGHT HANDLE (MISCELLANEOUS) ×4 IMPLANT
CUFF TOURNIQUET SINGLE 34IN LL (TOURNIQUET CUFF) ×4 IMPLANT
DRAPE EXTREMITY T 121X128X90 (DRAPE) ×4 IMPLANT
DRAPE ORTHO SPLIT 77X108 STRL (DRAPES) ×8
DRAPE PROXIMA HALF (DRAPES) ×4 IMPLANT
DRAPE SURG ORHT 6 SPLT 77X108 (DRAPES) IMPLANT
DRAPE U-SHAPE 47X51 STRL (DRAPES) ×4 IMPLANT
DRSG ADAPTIC 3X8 NADH LF (GAUZE/BANDAGES/DRESSINGS) ×2 IMPLANT
DRSG PAD ABDOMINAL 8X10 ST (GAUZE/BANDAGES/DRESSINGS) ×4 IMPLANT
ELECT CAUTERY BLADE 6.4 (BLADE) ×4 IMPLANT
ELECT REM PT RETURN 9FT ADLT (ELECTROSURGICAL) ×4
ELECTRODE REM PT RTRN 9FT ADLT (ELECTROSURGICAL) ×2 IMPLANT
EVACUATOR 1/8 PVC DRAIN (DRAIN) IMPLANT
FACESHIELD WRAPAROUND (MASK) IMPLANT
FACESHIELD WRAPAROUND OR TEAM (MASK) IMPLANT
GAUZE SPONGE 4X4 12PLY STRL (GAUZE/BANDAGES/DRESSINGS) ×4 IMPLANT
GAUZE XEROFORM 5X9 LF (GAUZE/BANDAGES/DRESSINGS) ×4 IMPLANT
GLOVE NEODERM STRL 7.5 LF PF (GLOVE) ×4 IMPLANT
GLOVE SURG NEODERM 7.5  LF PF (GLOVE) ×4
GOWN STRL REIN XL XLG (GOWN DISPOSABLE) ×4 IMPLANT
KIT BASIN OR (CUSTOM PROCEDURE TRAY) ×4 IMPLANT
NS IRRIG 1000ML POUR BTL (IV SOLUTION) ×4 IMPLANT
PACK GENERAL/GYN (CUSTOM PROCEDURE TRAY) ×4 IMPLANT
PAD ARMBOARD 7.5X6 YLW CONV (MISCELLANEOUS) ×4 IMPLANT
PAD CAST 4YDX4 CTTN HI CHSV (CAST SUPPLIES) ×8 IMPLANT
PADDING CAST COTTON 4X4 STRL (CAST SUPPLIES) ×16
PADDING CAST COTTON 6X4 STRL (CAST SUPPLIES) ×2 IMPLANT
SPONGE GAUZE 4X4 12PLY STER LF (GAUZE/BANDAGES/DRESSINGS) ×2 IMPLANT
SPONGE LAP 18X18 X RAY DECT (DISPOSABLE) IMPLANT
STAPLER VISISTAT 35W (STAPLE) IMPLANT
STOCKINETTE IMPERVIOUS LG (DRAPES) ×4 IMPLANT
SUT ETHILON 2 0 FS 18 (SUTURE) ×2 IMPLANT
SUT MON AB 2-0 CT1 36 (SUTURE) ×2 IMPLANT
SUT PDS AB 0 CT 36 (SUTURE) ×2 IMPLANT
SUT SILK 0 TIES 10X30 (SUTURE) ×4 IMPLANT
SUT SILK 2 0 SH CR/8 (SUTURE) IMPLANT
SUT VIC AB 0 CT1 27 (SUTURE) ×8
SUT VIC AB 0 CT1 27XBRD ANBCTR (SUTURE) ×4 IMPLANT
SUT VIC AB 1 CT1 27 (SUTURE) ×8
SUT VIC AB 1 CT1 27XBRD ANBCTR (SUTURE) ×4 IMPLANT
TOWEL OR 17X26 10 PK STRL BLUE (TOWEL DISPOSABLE) ×12 IMPLANT

## 2015-09-03 NOTE — Anesthesia Preprocedure Evaluation (Addendum)
Anesthesia Evaluation  Patient identified by MRN, date of birth, ID band Patient awake    Reviewed: Allergy & Precautions, NPO status , Patient's Chart, lab work & pertinent test results  History of Anesthesia Complications Negative for: history of anesthetic complications  Airway Mallampati: II  TM Distance: >3 FB Neck ROM: Full    Dental  (+) Dental Advisory Given   Pulmonary neg pulmonary ROS,    breath sounds clear to auscultation       Cardiovascular negative cardio ROS   Rhythm:Regular Rate:Normal  7/16 ECHO: EF 55-60%, small secundum ASD with L-R shunt   Neuro/Psych negative neurological ROS     GI/Hepatic negative GI ROS, Neg liver ROS,   Endo/Other  diabetes (glu 235), Insulin Dependent  Renal/GU negative Renal ROS     Musculoskeletal   Abdominal   Peds  Hematology  (+) Blood dyscrasia (Hb 10.4), ,   Anesthesia Other Findings   Reproductive/Obstetrics                         Anesthesia Physical Anesthesia Plan  ASA: III  Anesthesia Plan: General   Post-op Pain Management:    Induction: Intravenous  Airway Management Planned: LMA  Additional Equipment:   Intra-op Plan:   Post-operative Plan:   Informed Consent: I have reviewed the patients History and Physical, chart, labs and discussed the procedure including the risks, benefits and alternatives for the proposed anesthesia with the patient or authorized representative who has indicated his/her understanding and acceptance.   Dental advisory given  Plan Discussed with: CRNA and Surgeon  Anesthesia Plan Comments: (Plan routine monitors, GA- LMA OK)        Anesthesia Quick Evaluation
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
## 2015-09-03 NOTE — H&P (Signed)

## 2015-09-03 NOTE — Progress Notes (Signed)
ANTIBIOTIC CONSULT NOTE - INITIAL  Pharmacy Consult for Vancomycin and Zosyn  Indication: wound infection  No Known Allergies  Patient Measurements:   Adjusted Body Weight: 53 kg  Vital Signs: Temp: 99.4 F (37.4 C) (10/03 1904) Temp Source: Oral (10/03 1904) BP: 122/82 mmHg (10/03 2219) Pulse Rate: 95 (10/03 2219) Intake/Output from previous day:   Intake/Output from this shift:    Labs:  Recent Labs  09/02/15 1918  WBC 8.9  HGB 11.6*  PLT 339  CREATININE 1.04*   CrCl cannot be calculated (Unknown ideal weight.). No results for input(s): VANCOTROUGH, VANCOPEAK, VANCORANDOM, GENTTROUGH, GENTPEAK, GENTRANDOM, TOBRATROUGH, TOBRAPEAK, TOBRARND, AMIKACINPEAK, AMIKACINTROU, AMIKACIN in the last 72 hours.   Microbiology: No results found for this or any previous visit (from the past 720 hour(s)).  Medical History: Past Medical History  Diagnosis Date  . Diabetes mellitus   . 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 Janie Morning), Cysto performed by Dr. Jonna Munro 12/2009 with evac of clots and bx and fulguration. // H/O stage 2 SCC of the vulva  . Anemia     2/2 blood loss  . Diabetes mellitus type 2, uncontrolled ([place]) DX: 2001  . Osteomyelitis ([place])     S/P removal 2nd MT head 01/29/11 - CX showing MSSA and GBS  . Diabetic foot ulcers ([place])   . Cataract   . Diabetes mellitus without complication ([facility])     Medications:  Novolog  Lantus  Niferex  MVI  Fibercon  Mylicon  Ultram  Assessment: 49 y.o. female with L BKA stump infection for empiric antibiotics  Goal of Therapy:  Vancomycin trough level 10-15 mcg/ml  Plan:  Vancomycin 1 g IV q12h Zosyn 3.375 g IV q8h   Abou Sterkel, Bronson Curb 09/03/2015,2:12 AM

## 2015-09-03 NOTE — Anesthesia Postprocedure Evaluation (Signed)
  Anesthesia Post-op Note  Patient: [name]  Procedure(s) Performed: Procedure(s): LEFT LEG REVISION AMPUTATION (Left) IRRIGATION AND DEBRIDEMENT LEFT BELOW KNEE AMPUTATION (Left)  Patient Location: PACU  Anesthesia Type:General  Level of Consciousness: awake, alert , oriented and patient cooperative  Airway and Oxygen Therapy: Patient Spontanous Breathing  Post-op Pain: mild  Post-op Assessment: Post-op Vital signs reviewed, Patient's Cardiovascular Status Stable, Respiratory Function Stable, Patent Airway, No signs of Nausea or vomiting and Pain level controlled              Post-op Vital Signs: Reviewed and stable  Last Vitals:  Filed Vitals:   09/03/15 1730  BP:   Pulse: 95  Temp:   Resp: 10    Complications: No apparent anesthesia complications

## 2015-09-03 NOTE — Op Note (Signed)
   Date of Surgery: 09/03/2015  INDICATIONS: Ms. Heuerman is a 49 y.o.-year-old female who had a below the knee amputation a few months ago that was complicated by wound dehiscence.  She presented this time with purulent drainage from the tip of her amputation stump.  She was indicated for formal irrigation and debridement of the stump with possible revision;  The patient did consent to the procedure after discussion of the risks and benefits.  PREOPERATIVE DIAGNOSIS: Left below the knee amputation stump abscess, osteomyelitis  POSTOPERATIVE DIAGNOSIS: Same.  PROCEDURE:  1. Irrigation and debridement of muscle, soft tissue, skin 5 x 5 cm 2. Revision amputation of left below the knee amputation 3. Adjacent tissue rearrangement 5 x 4 cm  SURGEON: N. Eduard Roux, M.D.  SPECIMENS: 2 cultures  ASSIST: Laure Kidney, RNFA.  ANESTHESIA:  general  IV FLUIDS AND URINE: See anesthesia.  ESTIMATED BLOOD LOSS: minimal mL.  IMPLANTS: none  DRAINS: none  COMPLICATIONS: None.  DESCRIPTION OF PROCEDURE: The patient was brought to the operating room and placed supine on the operating table.  The patient had been signed prior to the procedure and this was documented. The patient had the anesthesia placed by the anesthesiologist.  A time-out was performed to confirm that this was the correct patient, site, side and location. A nonsterile tourniquet was placed on the upper left thigh. The patient had an SCD on the opposite lower extremity. The patient did receive antibiotics prior to the incision and was re-dosed during the procedure as needed at indicated intervals.  The patient had the operative extremity prepped and draped in the standard surgical fashion.    The previous incision was used. The draining sinus was ellipsed out. The sinus was tracked down to the amputation stump. There was signs of osteomyelitis. We then performed sharp excisional debridement of the muscle, soft tissue, skin with a  knife and rongeur and curette.  We then revised her amputation by approximately 1 cm. A sagittal saw was used to perform this. 2 cultures were obtained and sent off to the lab. Antibiotics were then given. After we felt that we had adequate debridement we irrigated the wound with pulse lavage. The wound was then closed in layer fashion using 0 Vicryls for the muscle and fascia, 2-0 Monocryl for the subcutaneous layer, 2-0 nylon for the skin. I had to perform adjacent tissue rearrangement in order to close the wound.  This measured proximally 5 x 4 cm.  Sterile dressings were applied. The patient tolerated the procedure well was expanded and transferred to the PACU in stable condition.  POSTOPERATIVE PLAN: The patient will need to remain on broad-spectrum antibiotics until speciation of the bacteria. The patient will need at least 6 weeks of antibiotics. I do recommend an infectious disease consult.  [name], MD [place] 4:41 PM

## 2015-09-03 NOTE — Transfer of Care (Signed)
Immediate Anesthesia Transfer of Care Note  Patient: [name]  Procedure(s) Performed: Procedure(s): LEFT LEG REVISION AMPUTATION (Left) IRRIGATION AND DEBRIDEMENT LEFT BELOW KNEE AMPUTATION (Left)  Patient Location: PACU  Anesthesia Type:General  Level of Consciousness: awake, sedated and patient cooperative  Airway & Oxygen Therapy: Patient Spontanous Breathing and Patient connected to nasal cannula oxygen  Post-op Assessment: Report given to RN and Post -op Vital signs reviewed and stable  Post vital signs: Reviewed and stable  Last Vitals:  Filed Vitals:   09/03/15 1657  BP: 144/69  Pulse: 96  Temp:   Resp: 13    Complications: No apparent anesthesia complications

## 2015-09-03 NOTE — Anesthesia Procedure Notes (Signed)
Procedure Name: LMA Insertion Date/Time: 09/03/2015 4:49 PM Performed by: Vennie Homans Pre-anesthesia Checklist: Patient identified, Timeout performed, Emergency Drugs available, Suction available and Patient being monitored Patient Re-evaluated:Patient Re-evaluated prior to inductionOxygen Delivery Method: Circle system utilized Preoxygenation: Pre-oxygenation with 100% oxygen Intubation Type: IV induction Ventilation: Mask ventilation without difficulty LMA: LMA inserted LMA Size: 3.0 Number of attempts: 1 Placement Confirmation: positive ETCO2 and breath sounds checked- equal and bilateral Tube secured with: Tape Dental Injury: Teeth and Oropharynx as per pre-operative assessment

## 2015-09-03 NOTE — Progress Notes (Signed)
Inpatient Diabetes Program Recommendations  AACE/ADA: New Consensus Statement on Inpatient Glycemic Control (2015)  Target Ranges:  Prepandial:   less than 140 mg/dL      Peak postprandial:   less than 180 mg/dL (1-2 hours)      Critically ill patients:  140 - 180 mg/dL  Results for [name], [name] (MRN [id number]) as of 09/03/2015 10:57  Ref. Range 09/03/2015 02:46 09/03/2015 04:36 09/03/2015 09:23  Glucose-Capillary Latest Ref Range: 65-99 mg/dL 311 (H) 309 (H) 263 (H)  Results for [name], [name] (MRN [id number]) as of 09/03/2015 10:57  Ref. Range 06/01/2015 21:29  Hemoglobin A1C Latest Ref Range: 4.8-5.6 % 11.7 (H)   Review of Glycemic Control  Diabetes history: DM2 Outpatient Diabetes medications: Lantus 25 units QHS, Novolog 10 units TID with meals Current orders for Inpatient glycemic control: Lantus 17 units QHS, Nvolog 0-15 units Q4H  Inpatient Diabetes Program Recommendations: Insulin - Basal: Please consider increasing Lantus to 20 units QHS. HgbA1C: Last A1C in the chart was 11.7% on 06/01/2015. Please consider ordering an A1C to evaluate glycemic control over the past 2-3 months.  Thanks, Barnie Alderman, RN, MSN, CCRN, CDE Diabetes Coordinator Inpatient Diabetes Program [phone] (Team Pager from [place] to [place]) [phone] ([place] office) [phone] [facility] office) [phone] [facility] office)

## 2015-09-03 NOTE — H&P (Signed)
[facility] History and Physical  [name] YNW:[id number] DOB: 1966/01/15 DOA: 09/02/2015  Referring physician: EDP PCP: Lorayne Marek, MD   Chief Complaint: Wound infection   HPI: [name] is a 49 y.o. 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  Complicated by wound infection and dehiscence 7/27, MSSA bacteremia treated with ancef completed course on 8/1.  Wound has started draining purulent fluid again yesterday.  She presents to ED today.  Review of Systems: Systems reviewed.  As above, otherwise negative  Past Medical History  Diagnosis Date  . Diabetes mellitus   . 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 Janie Morning), Cysto performed by Dr. Jonna Munro 12/2009 with evac of clots and bx and fulguration. // H/O stage 2 SCC of the vulva  . Anemia     2/2 blood loss  . Diabetes mellitus type 2, uncontrolled ([place]) DX: 2001  . Osteomyelitis ([place])     S/P removal 2nd MT head 01/29/11 - CX showing MSSA and GBS  . Diabetic foot ulcers ([place])   . Cataract   . Diabetes mellitus without complication [facility])    Past Surgical History  Procedure Laterality Date  . Radical wide local excision of the vulva and right nguinal lymph node dissection  10/2003    Dr. Fermin Schwab  . Uterine dilatation and currettage    . Labial mass excision  07/2003    Dr. Ree Edman  . Left second toe mtp joint amputation  12/19/2010    Dr. Mayer Camel  . Irrigation and debridement of left foot with removal of left  01/29/2011    Dr. Mayer Camel  . Tubal ligation    . I&d extremity Left 06/02/2015    Procedure: IRRIGATION AND DEBRIDEMENT EXTREMITY;  Surgeon: Leandrew Koyanagi, MD;  Location: [facility];  Service: Orthopedics;  Laterality: Left;  . Amputation Left 06/02/2015    Procedure: Partial Left  AMPUTATION FOOT;   Surgeon: Leandrew Koyanagi, MD;  Location: [facility];  Service: Orthopedics;  Laterality: Left;  . I&d extremity Left 06/05/2015    Procedure: IRRIGATION AND DEBRIDEMENT LEFT LEG, POSSIBLE CLOSURE BELOW KNEE AMPUTATION;  Surgeon: Leandrew Koyanagi, MD;  Location: [place];  Service: Orthopedics;  Laterality: Left;  . Amputation Left 06/03/2015    Procedure: Left Transtibial amputation,application of wound vac;  Surgeon: Leandrew Koyanagi, MD;  Location: [facility];  Service: Orthopedics;  Laterality: Left;  . I&d extremity Left 06/08/2015    Procedure: IRRIGATION AND DEBRIDEMENT LEFT LEG, WITH  CLOSURE OF BKA;  Surgeon: Leandrew Koyanagi, MD;  Location: [place];  Service: Orthopedics;  Laterality: Left;  . Tee without cardioversion N/A 06/11/2015    Procedure: TRANSESOPHAGEAL ECHOCARDIOGRAM (TEE);  Surgeon: Lelon Perla, MD;  Location: [facility];  Service: Cardiovascular;  Laterality: N/A;  . Amputation Left 06/26/2015    Procedure: AMPUTATION BELOW KNEE STUMP RECLOSURE ;  Surgeon: Marybelle Killings, MD;  Location: [place];  Service: Orthopedics;  Laterality: Left;   Social History:  reports that she has never smoked. She has never used smokeless tobacco. She reports that she does not drink alcohol or use illicit drugs.  No Known Allergies  Family History  Problem Relation Age of Onset  . Diabetes Mother   . Diabetes Father   . Diabetes Brother      Prior  to Admission medications   Medication Sig Start Date End Date Taking? Authorizing Provider  acetaminophen (TYLENOL) 325 MG tablet Take 2 tablets (650 mg total) by mouth every 6 (six) hours as needed for mild pain or moderate pain. 06/18/15   Bary Leriche, PA-C  Blood Glucose Monitoring Suppl (TRUE METRIX METER) DEVI 1 each by Does not apply route 3 (three) times daily. 07/02/15   Lorayne Marek, MD  glucose blood (TRUE METRIX BLOOD GLUCOSE TEST) test strip USE TO TEST BLOOD SUGAR 3 TIMES DAILY 07/02/15   Lorayne Marek, MD  insulin aspart (NOVOLOG FLEXPEN) 100 UNIT/ML FlexPen Inject  10 Units into the skin 3 (three) times daily with meals. 07/04/15   Lorayne Marek, MD  Insulin Glargine (LANTUS SOLOSTAR) 100 UNIT/ML Solostar Pen Inject 25 Units into the skin daily at 10 pm. 07/04/15   Lorayne Marek, MD  Insulin Pen Needle 32G X 4 MM MISC 1 application by Does not apply route 4 (four) times daily -  with meals and at bedtime. 06/18/15   Bary Leriche, PA-C  iron polysaccharides (NIFEREX) 150 MG capsule Take 1 capsule (150 mg total) by mouth 2 times daily at 12 noon and 4 pm. 06/18/15   Bary Leriche, PA-C  Multiple Vitamin (MULTIVITAMIN WITH MINERALS) TABS tablet Take 1 tablet by mouth daily. 06/18/15   Bary Leriche, PA-C  polycarbophil (FIBERCON) 625 MG tablet Take 1 tablet (625 mg total) by mouth 2 (two) times daily. 06/18/15   Bary Leriche, PA-C  simethicone (MYLICON) 80 MG chewable tablet Chew 1 tablet (80 mg total) by mouth 4 (four) times daily as needed for flatulence (for gas and bloating). 06/18/15   Bary Leriche, PA-C  traMADol (ULTRAM) 50 MG tablet Take 0.5-1 tablets (25-50 mg total) by mouth every 6 (six) hours as needed for severe pain. 06/28/15   Lanae Crumbly, PA-C  TRUEPLUS LANCETS 28G MISC USE TO TEST BLOOD SUGAR 3 TIMES A DAY 07/02/15   Lorayne Marek, MD   Physical Exam: Filed Vitals:   09/02/15 2219  BP: 122/82  Pulse: 95  Temp:   Resp: 16    BP 122/82 mmHg  Pulse 95  Temp(Src) 99.4 F (37.4 C) (Oral)  Resp 16  SpO2 99%  LMP 05/12/2013  General Appearance:    Alert, oriented, no distress, appears stated age  Head:    Normocephalic, atraumatic  Eyes:    PERRL, EOMI, sclera non-icteric        Nose:   Nares without drainage or epistaxis. Mucosa, turbinates normal  Throat:   Moist mucous membranes. Oropharynx without erythema or exudate.  Neck:   Supple. No carotid bruits.  No thyromegaly.  No lymphadenopathy.   Back:     No CVA tenderness, no spinal tenderness  Lungs:     Clear to auscultation bilaterally, without wheezes, rhonchi or rales  Chest wall:     No tenderness to palpitation  Heart:    Regular rate and rhythm without murmurs, gallops, rubs  Abdomen:     Soft, non-tender, nondistended, normal bowel sounds, no organomegaly  Genitalia:    deferred  Rectal:    deferred  Extremities:   No clubbing, cyanosis or edema.  Pulses:   2+ and symmetric all extremities  Skin:   Purulent drainage at incision site for L BKA stump  Lymph nodes:   Cervical, supraclavicular, and axillary nodes normal  Neurologic:   CNII-XII intact. Normal strength, sensation and reflexes  throughout    Labs on Admission:  Basic Metabolic Panel:  Recent Labs Lab 09/02/15 1918  NA 133*  K 3.8  CL 98*  CO2 29  GLUCOSE 305*  BUN 19  CREATININE 1.04*  CALCIUM 8.6*   Liver Function Tests: No results for input(s): AST, ALT, ALKPHOS, BILITOT, PROT, ALBUMIN in the last 168 hours. No results for input(s): LIPASE, AMYLASE in the last 168 hours. No results for input(s): AMMONIA in the last 168 hours. CBC:  Recent Labs Lab 09/02/15 1918  WBC 8.9  HGB 11.6*  HCT 34.6*  MCV 87.4  PLT 339   Cardiac Enzymes: No results for input(s): CKTOTAL, CKMB, CKMBINDEX, TROPONINI in the last 168 hours.  BNP (last 3 results) No results for input(s): PROBNP in the last 8760 hours. CBG: No results for input(s): GLUCAP in the last 168 hours.  Radiological Exams on Admission: Dg Tibia/fibula Left  09/03/2015   CLINICAL DATA:  Purulent drainage at amputation stump.  EXAM: LEFT TIBIA AND FIBULA - 2 VIEW  COMPARISON:  06/26/2015  FINDINGS: No soft tissue gas is evident. No bony destruction is evident. There is no radiopaque foreign body.  IMPRESSION: Negative for soft tissue gas or bony destruction. MRI will be more sensitive and specific for osteomyelitis if additional imaging is clinically warranted.   Electronically Signed   By: Andreas Newport M.D.   On: 09/03/2015 01:13    EKG: Independently reviewed.  Assessment/Plan Principal Problem:   Infection of  amputation stump of left lower extremity (HCC) Active Problems:   Diabetes mellitus type 2, controlled ([place])   Status post below knee amputation of left lower extremity (HCC)   Wound dehiscence   1. Infection of left BKA stump wound - 1. Conveniently Dr. Sharol Given (diabetic foot infection sub specialist) is the orthopaedic surgeon who is on call for [facility] this evening 2. Sharol Given to see patient in AM 3. Patient NPO and SCDs only for dvt PPX in case operative debridement is desired 4. Zosyn and vanc per Dr. Jess Barters instructions 2. DM2 - 1. Lantus 17 units QHS while NPO (instead of 25) 2. SSI q4h  Code Status: Full Code  Family Communication: Family at bedside, used interpreter phone as they speak spanish primarily Disposition Plan: Admit to inpatient   Time spent: 70 min  GARDNER, JARED M. [facility] Pager [phone]  If 7AM-7PM, please contact the day team taking care of the patient [email or web address] Password [email or web address] 09/03/2015, 2:02 AM

## 2015-09-03 NOTE — Progress Notes (Signed)
Patient ID: [name], female   DOB: [birth year], 49 y.o.   MRN: [id number] Left transtibial amputation with draining abscess. I will contact Dr. Lorin Mercy regarding treatment options for this patient

## 2015-09-03 NOTE — Progress Notes (Addendum)
Utilization review completed.  

## 2015-09-04 ENCOUNTER — Encounter (HOSPITAL_COMMUNITY): Payer: Self-pay | Admitting: Orthopaedic Surgery

## 2015-09-04 DIAGNOSIS — M8618 Other acute osteomyelitis, other site: Secondary | ICD-10-CM

## 2015-09-04 DIAGNOSIS — B9689 Other specified bacterial agents as the cause of diseases classified elsewhere: Secondary | ICD-10-CM

## 2015-09-04 DIAGNOSIS — E1165 Type 2 diabetes mellitus with hyperglycemia: Secondary | ICD-10-CM

## 2015-09-04 LAB — GLUCOSE, CAPILLARY
GLUCOSE-CAPILLARY: 115 mg/dL — AB (ref 65–99)
GLUCOSE-CAPILLARY: 233 mg/dL — AB (ref 65–99)
Glucose-Capillary: 157 mg/dL — ABNORMAL HIGH (ref 65–99)
Glucose-Capillary: 208 mg/dL — ABNORMAL HIGH (ref 65–99)
Glucose-Capillary: 299 mg/dL — ABNORMAL HIGH (ref 65–99)
Glucose-Capillary: 169 mg/dL — ABNORMAL HIGH (ref 65–99)

## 2015-09-04 MED ORDER — INSULIN GLARGINE 100 UNIT/ML ~~LOC~~ SOLN
20.0000 [IU] | Freq: Every day | SUBCUTANEOUS | Status: DC
  Administered 2015-09-04: 20 [IU] via SUBCUTANEOUS
  Filled 2015-09-04 (×2): qty 0.2

## 2015-09-04 MED ORDER — INSULIN ASPART 100 UNIT/ML ~~LOC~~ SOLN
0.0000 [IU] | Freq: Three times a day (TID) | SUBCUTANEOUS | Status: DC
  Administered 2015-09-04: 8 [IU] via SUBCUTANEOUS
  Administered 2015-09-05 – 2015-09-06 (×3): 5 [IU] via SUBCUTANEOUS
  Administered 2015-09-06: 3 [IU] via SUBCUTANEOUS
  Administered 2015-09-06: 5 [IU] via SUBCUTANEOUS
  Administered 2015-09-07 (×2): 3 [IU] via SUBCUTANEOUS

## 2015-09-04 MED ORDER — INSULIN ASPART 100 UNIT/ML ~~LOC~~ SOLN
0.0000 [IU] | Freq: Every day | SUBCUTANEOUS | Status: DC
  Administered 2015-09-05: 2 [IU] via SUBCUTANEOUS

## 2015-09-04 NOTE — Progress Notes (Signed)
PROGRESS NOTE  [name] WJX:[id number] DOB: 1966-11-20 DOA: 09/02/2015 PCP: Doris Cheadle, MD  Assessment/Plan:  Left below the knee amputation stump abscess, osteomyelitis - Up with PT/OT - DVT ppx - SCDs, ambulation - NWB operative extremity - cultures pending - ID consult-- 6 weeks of abx? - vanc/zosyn   ABLA -trend  DM -lantus -SSI  Code Status: full Family Communication: patient Disposition Plan:    Consultants:  Ortho  ID  Procedures:      HPI/Subjective: Spoke with patient and son via phone translator   Objective: Filed Vitals:   09/04/15 0400  BP: 111/53  Pulse: 89  Temp: 99.1 F (37.3 C)  Resp:     Intake/Output Summary (Last 24 hours) at 09/04/15 1340 Last data filed at 09/04/15 0900  Gross per 24 hour  Intake   1360 ml  Output     20 ml  Net   1340 ml   Filed Weights   09/03/15 0319  Weight: 57.3 kg (126 lb 5.2 oz)    Exam:   General:  Awake, NAD  Cardiovascular: rrr  Respiratory: clear  Abdomen: +BS, soft  Musculoskeletal: no edema   Data Reviewed: Basic Metabolic Panel:  Recent Labs Lab 09/02/15 1918 09/03/15 0537  NA 133* 135  K 3.8 3.7  CL 98* 104  CO2 29 24  GLUCOSE 305* 327*  BUN 19 18  CREATININE 1.04* 0.73  CALCIUM 8.6* 7.9*   Liver Function Tests: No results for input(s): AST, ALT, ALKPHOS, BILITOT, PROT, ALBUMIN in the last 168 hours. No results for input(s): LIPASE, AMYLASE in the last 168 hours. No results for input(s): AMMONIA in the last 168 hours. CBC:  Recent Labs Lab 09/02/15 1918 09/03/15 0537  WBC 8.9 9.0  HGB 11.6* 10.4*  HCT 34.6* 31.1*  MCV 87.4 88.6  PLT 339 293   Cardiac Enzymes: No results for input(s): CKTOTAL, CKMB, CKMBINDEX, TROPONINI in the last 168 hours. BNP (last 3 results)  Recent Labs  06/02/15 0415  BNP 194.6*    ProBNP (last 3 results) No results for input(s): PROBNP in the last 8760 hours.  CBG:  Recent Labs Lab 09/03/15 1829  09/03/15 2230 09/04/15 0128 09/04/15 0406 09/04/15 1201  GLUCAP 111* 183* 233* 157* 208*    Recent Results (from the past 240 hour(s))  Wound culture     Status: None (Preliminary result)   Collection Time: 09/03/15  1:15 AM  Result Value Ref Range Status   Specimen Description WOUND LEFT LEG  Final   Special Requests NONE  Final   Gram Stain   Final    FEW WBC PRESENT,BOTH PMN AND MONONUCLEAR NO SQUAMOUS EPITHELIAL CELLS SEEN NO ORGANISMS SEEN Performed at [facility]    Culture   Final    Culture reincubated for better growth Performed at [facility]    Report Status PENDING  Incomplete  Blood culture (routine x 2)     Status: None (Preliminary result)   Collection Time: 09/03/15  1:23 AM  Result Value Ref Range Status   Specimen Description BLOOD RIGHT FOREARM  Final   Special Requests BOTTLES DRAWN AEROBIC AND ANAEROBIC 5CC   Final   Culture NO GROWTH 1 DAY  Final   Report Status PENDING  Incomplete  Blood culture (routine x 2)     Status: None (Preliminary result)   Collection Time: 09/03/15  1:29 AM  Result Value Ref Range Status   Specimen Description BLOOD LEFT ANTECUBITAL  Final   Special  Requests BOTTLES DRAWN AEROBIC AND ANAEROBIC 5CC   Final   Culture NO GROWTH 1 DAY  Final   Report Status PENDING  Incomplete  Anaerobic culture     Status: None (Preliminary result)   Collection Time: 09/03/15  4:04 PM  Result Value Ref Range Status   Specimen Description TISSUE LEFT LEG  Final   Special Requests PART A DEEP TISSUE FROM LEFT LEG  Final   Gram Stain   Final    NO WBC SEEN NO SQUAMOUS EPITHELIAL CELLS SEEN NO ORGANISMS SEEN Performed at [facility]    Culture PENDING  Incomplete   Report Status PENDING  Incomplete  Fungus Culture with Smear     Status: None (Preliminary result)   Collection Time: 09/03/15  4:04 PM  Result Value Ref Range Status   Specimen Description TISSUE LEFT LEG  Final   Special Requests PART A DEEP  TISSUE FROM LEFT LEG  Final   Fungal Smear   Final    NO YEAST OR FUNGAL ELEMENTS SEEN Performed at [facility]    Culture   Final    CULTURE IN PROGRESS FOR FOUR WEEKS Performed at [facility]    Report Status PENDING  Incomplete  AFB culture with smear     Status: None (Preliminary result)   Collection Time: 09/03/15  4:04 PM  Result Value Ref Range Status   Specimen Description TISSUE LEFT LEG  Final   Special Requests PART A DEEP TISSUE FROM LEFT LEG  Final   Acid Fast Smear   Final    NO ACID FAST BACILLI SEEN Performed at [facility]    Culture   Final    CULTURE WILL BE EXAMINED FOR 6 WEEKS BEFORE ISSUING A FINAL REPORT Performed at [facility]    Report Status PENDING  Incomplete  Tissue culture     Status: None (Preliminary result)   Collection Time: 09/03/15  4:04 PM  Result Value Ref Range Status   Specimen Description TISSUE  Final   Special Requests PART A DEEP TISSUE FROM LEFT LEG  Final   Gram Stain   Final    NO WBC SEEN NO SQUAMOUS EPITHELIAL CELLS SEEN NO ORGANISMS SEEN Performed at [facility]    Culture NO GROWTH Performed at [facility]   Final   Report Status PENDING  Incomplete  Anaerobic culture     Status: None (Preliminary result)   Collection Time: 09/03/15  4:14 PM  Result Value Ref Range Status   Specimen Description TISSUE LEFT LEG  Final   Special Requests PART B SUPERFICIAL TISSUE FROM LEFT LEG  Final   Gram Stain   Final    NO WBC SEEN NO SQUAMOUS EPITHELIAL CELLS SEEN NO ORGANISMS SEEN Performed at [facility]    Culture PENDING  Incomplete   Report Status PENDING  Incomplete  Fungus Culture with Smear     Status: None (Preliminary result)   Collection Time: 09/03/15  4:14 PM  Result Value Ref Range Status   Specimen Description TISSUE LEFT LEG  Final   Special Requests PART B SUPERFICIAL TISSUE FROM LEFT LEG  Final   Fungal Smear   Final    NO YEAST OR FUNGAL  ELEMENTS SEEN Performed at [facility]    Culture   Final    CULTURE IN PROGRESS FOR FOUR WEEKS Performed at [facility]    Report Status PENDING  Incomplete  AFB culture  with smear     Status: None (Preliminary result)   Collection Time: 09/03/15  4:14 PM  Result Value Ref Range Status   Specimen Description TISSUE LEFT LEG  Final   Special Requests PART B SUPERFICIAL TISSUE FROM LEFT LEG  Final   Acid Fast Smear   Final    NO ACID FAST BACILLI SEEN Performed at [facility]    Culture   Final    CULTURE WILL BE EXAMINED FOR 6 WEEKS BEFORE ISSUING A FINAL REPORT Performed at [facility]    Report Status PENDING  Incomplete  Tissue culture     Status: None (Preliminary result)   Collection Time: 09/03/15  4:14 PM  Result Value Ref Range Status   Specimen Description TISSUE  Final   Special Requests PART B SUPERFICIAL TISSUE FROM LEFT LEG  Final   Gram Stain   Final    NO WBC SEEN NO SQUAMOUS EPITHELIAL CELLS SEEN NO ORGANISMS SEEN Performed at [facility]    Culture NO GROWTH Performed at [facility]   Final   Report Status PENDING  Incomplete     Studies: Dg Tibia/fibula Left  09/03/2015   CLINICAL DATA:  Purulent drainage at amputation stump.  EXAM: LEFT TIBIA AND FIBULA - 2 VIEW  COMPARISON:  06/26/2015  FINDINGS: No soft tissue gas is evident. No bony destruction is evident. There is no radiopaque foreign body.  IMPRESSION: Negative for soft tissue gas or bony destruction. MRI will be more sensitive and specific for osteomyelitis if additional imaging is clinically warranted.   Electronically Signed   By: Ellery Plunk M.D.   On: 09/03/2015 01:13    Scheduled Meds: . insulin aspart  0-15 Units Subcutaneous TID WC  . insulin aspart  0-5 Units Subcutaneous QHS  . insulin glargine  20 Units Subcutaneous Q2200  . piperacillin-tazobactam (ZOSYN)  IV  3.375 g Intravenous Q8H  . sodium chloride  3 mL Intravenous  Q12H  . vancomycin  1,000 mg Intravenous Q12H   Continuous Infusions:  Antibiotics Given (last 72 hours)    Date/Time Action Medication Dose Rate   09/03/15 0942 Given  [Iv inflitrated]   piperacillin-tazobactam (ZOSYN) IVPB 3.375 g 3.375 g 12.5 mL/hr   09/03/15 1042 Given   vancomycin (VANCOCIN) IVPB 1000 mg/200 mL premix 1,000 mg 200 mL/hr   09/03/15 2158 Given   vancomycin (VANCOCIN) IVPB 1000 mg/200 mL premix 1,000 mg 200 mL/hr   09/04/15 0157 Given   piperacillin-tazobactam (ZOSYN) IVPB 3.375 g 3.375 g 12.5 mL/hr   09/04/15 1145 Given   vancomycin (VANCOCIN) IVPB 1000 mg/200 mL premix 1,000 mg 200 mL/hr   09/04/15 1145 Given   piperacillin-tazobactam (ZOSYN) IVPB 3.375 g 3.375 g 12.5 mL/hr      Principal Problem:   Infection of amputation stump of left lower extremity (HCC) Active Problems:   Diabetes mellitus type 2, controlled (HCC)   Status post below knee amputation of left lower extremity (HCC)   Wound dehiscence    Time spent: 25 min    [name]  [facility] Pager [phone]. If 7PM-7AM, please contact night-coverage at [email or web address], password [email or web address] 09/04/2015, 1:40 PM  LOS: 1 day

## 2015-09-04 NOTE — Progress Notes (Signed)
Inpatient Diabetes Program Recommendations  AACE/ADA: New Consensus Statement on Inpatient Glycemic Control (2015)  Target Ranges:  Prepandial:   less than 140 mg/dL      Peak postprandial:   less than 180 mg/dL (1-2 hours)      Critically ill patients:  140 - 180 mg/dL  Results for [name], [name] (MRN [id number]) as of 09/04/2015 09:54  Ref. Range 09/03/2015 04:36 09/03/2015 09:23 09/03/2015 12:07 09/03/2015 14:38 09/03/2015 18:29 09/03/2015 22:30 09/04/2015 01:28 09/04/2015 04:06  Glucose-Capillary Latest Ref Range: 65-99 mg/dL 309 (H) 263 (H) 235 (H) 139 (H) 111 (H) 183 (H) 233 (H) 157 (H)   Review of Glycemic Control Diabetes history: DM2 Outpatient Diabetes medications: Lantus 25 units QHS, Novolog 10 units TID with meals Current orders for Inpatient glycemic control: Lantus 17 units QHS, Nvolog 0-15 units Q4H  Inpatient Diabetes Program Recommendations: Insulin - Basal: Please consider increasing Lantus to 20 units QHS. Correction (SSI): If patient is eating and tolerating diet, please consider changing frequency of CBGs and Novolog to ACHS. Insulin - Meal Coverage: If patient is eating at least 50% of meals, please consider ordering Novolog 5 units TID with meals for meal coverage. HgbA1C: Last A1C in the chart was 11.7% on 06/01/2015. Please consider ordering an A1C to evaluate glycemic control over the past 2-3 months.  Thanks, Barnie Alderman, RN, MSN, CCRN, CDE Diabetes Coordinator Inpatient Diabetes Program [phone] (Team Pager from [place] to [employer]) [phone] ([facility] office) [phone] [facility] office) [phone] [facility] office)

## 2015-09-04 NOTE — Consult Note (Signed)
[name] for Infectious Disease  Total days of antibiotics 2        Day 10/4-> vancomycin, zosyn        Reason for Consult: BKA stump abscess, osteomyelitis  Referring Physician: Eulogio Townsend  Principal Problem:   Infection of amputation stump of left lower extremity ([place]) Active Problems:   Diabetes mellitus type 2, controlled ([place])   Status post below knee amputation of left lower extremity ([place])   Wound dehiscence  HPI: [name] is a 49 y.o. Spanish-speaking female with poorly controlled Type II DM, Hx of stage 2 vulvar SCC, with a history of recurrent infections and amputations of the LLE throughout this year. She was treated with doxycycline for cellulitis of the L 3rd toe 03/22/15. Readmitted 06/02/15 with gas in soft tissue of the leg requiring BKA and treatment with Ancef for MSSA bacteremia. TEE obtained showing ASD but no vegetations. Returned 7/27 after wound dehiscence for I&D. Continued 4 wks ancef  IV until infection cleared by follow up with Dr. Megan Townsend on 08/01/15, who reported wound was clean intact nondraining but partially open. Now presented after new worsening pressure since thursday 9/29, with blood and pus expressed from dehiscence site on LLE stump Monday 10/3. Orthopedic surgery already revised BKA, removing 1cm of nonviable bone and debridement of involved tissue. No systemic symptoms reported such as fever, chills, nausea, diarrhea. She continues to have poorly controlled diabetes, checking home blood sugar once per few days and says she has been 200-300 at most times in the last month.  Past Medical History  Diagnosis Date  . Diabetes mellitus   . 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 Janie Townsend), Cysto performed by Dr. Jonna Townsend 12/2009 with evac of clots  and bx and fulguration. // H/O stage 2 SCC of the vulva  . Anemia     2/2 blood loss  . Diabetes mellitus type 2, uncontrolled ([place]) DX: 2001  . Osteomyelitis ([place])     S/P removal 2nd MT head 01/29/11 - CX showing MSSA and GBS  . Diabetic foot ulcers ([place])   . Cataract   . Diabetes mellitus without complication ([facility])     Allergies: No Known Allergies  Current antibiotics:  MEDICATIONS: . insulin aspart  0-15 Units Subcutaneous TID WC  . insulin aspart  0-5 Units Subcutaneous QHS  . insulin glargine  20 Units Subcutaneous Q2200  . piperacillin-tazobactam (ZOSYN)  IV  3.375 g Intravenous Q8H  . sodium chloride  3 mL Intravenous Q12H  . vancomycin  1,000 mg Intravenous Q12H    Social History  Substance Use Topics  . Smoking status: Never Smoker   . Smokeless tobacco: Never Used  . Alcohol Use: No    Family History  Problem Relation Age of Onset  . Diabetes Mother   . Diabetes Father   . Diabetes Brother     Review of Systems - Review of Systems  Constitutional: Negative for fever and chills.  Eyes: Negative for blurred vision.  Respiratory: Negative for cough and shortness of breath.   Cardiovascular: Negative for chest pain.  Gastrointestinal: Negative for nausea and diarrhea.  Genitourinary: Negative for dysuria.  Musculoskeletal: Negative for falls.  Skin: Negative for rash.  Neurological: Negative for dizziness and headaches.  Endo/Heme/Allergies: Negative for environmental allergies.  Psychiatric/Behavioral: The  patient does not have insomnia.     OBJECTIVE: Temp:  [97.1 F (36.2 C)-99.1 F (37.3 C)] 98.1 F (36.7 C) (10/05 1444) Pulse Rate:  [89-101] 93 (10/05 1444) Resp:  [10-18] 17 (10/05 1444) BP: (100-151)/(53-75) 100/54 mmHg (10/05 1444) SpO2:  [95 %-100 %] 97 % (10/05 1444)  GENERAL- alert, co-operative, NAD HEENT- Atraumatic, oral mucosa appears moist, no cervical LN enlargement. CARDIAC- RRR, no murmurs, rubs or gallops. RESP- CTAB, no  wheezes or crackles. ABDOMEN- Soft, nontender, no guarding or rebound, normoactive bowel sounds present BACK- Normal curvature, no paraspinal tenderness, no CVA tenderness. NEURO- No obvious Cr N abnormality, strength upper and lower extremities- 5/5, Sensation intact- globally EXTREMITIES- L BKA, with ACE dressing in place c/d/i, no visible swelling, erythema, drainage SKIN- Warm, dry, No rash or lesion. PSYCH- Normal mood and affect, appropriate thought content and speech.  LABS: Results for orders placed or performed during the hospital encounter of 09/02/15 (from the past 48 hour(s))  Basic metabolic panel     Status: Abnormal   Collection Time: 09/02/15  7:18 PM  Result Value Ref Range   Sodium 133 (L) 135 - 145 mmol/L   Potassium 3.8 3.5 - 5.1 mmol/L   Chloride 98 (L) 101 - 111 mmol/L   CO2 29 22 - 32 mmol/L   Glucose, Bld 305 (H) 65 - 99 mg/dL   BUN 19 6 - 20 mg/dL   Creatinine, Ser 1.04 (H) 0.44 - 1.00 mg/dL   Calcium 8.6 (L) 8.9 - 10.3 mg/dL   GFR calc non Af Amer >60 >60 mL/min   GFR calc Af Amer >60 >60 mL/min    Comment: (NOTE) The eGFR has been calculated using the CKD EPI equation. This calculation has not been validated in all clinical situations. eGFR's persistently <60 mL/min signify possible Chronic Kidney Disease.    Anion gap 6 5 - 15  CBC     Status: Abnormal   Collection Time: 09/02/15  7:18 PM  Result Value Ref Range   WBC 8.9 4.0 - 10.5 K/uL   RBC 3.96 3.87 - 5.11 MIL/uL   Hemoglobin 11.6 (L) 12.0 - 15.0 g/dL   HCT 34.6 (L) 36.0 - 46.0 %   MCV 87.4 78.0 - 100.0 fL   MCH 29.3 26.0 - 34.0 pg   MCHC 33.5 30.0 - 36.0 g/dL   RDW 13.1 11.5 - 15.5 %   Platelets 339 150 - 400 K/uL  Wound culture     Status: None (Preliminary result)   Collection Time: 09/03/15  1:15 AM  Result Value Ref Range   Specimen Description WOUND LEFT LEG    Special Requests NONE    Gram Stain      FEW WBC PRESENT,BOTH PMN AND MONONUCLEAR NO SQUAMOUS EPITHELIAL CELLS SEEN NO  ORGANISMS SEEN Performed at [facility]    Culture      Culture reincubated for better growth Performed at [facility]    Report Status PENDING   C-reactive protein     Status: Abnormal   Collection Time: 09/03/15  1:22 AM  Result Value Ref Range   CRP 2.1 (H) <1.0 mg/dL  Blood culture (routine x 2)     Status: None (Preliminary result)   Collection Time: 09/03/15  1:23 AM  Result Value Ref Range   Specimen Description BLOOD RIGHT FOREARM    Special Requests BOTTLES DRAWN AEROBIC AND ANAEROBIC 5CC     Culture NO GROWTH 1 DAY    Report Status PENDING  Blood culture (routine x 2)     Status: None (Preliminary result)   Collection Time: 09/03/15  1:29 AM  Result Value Ref Range   Specimen Description BLOOD LEFT ANTECUBITAL    Special Requests BOTTLES DRAWN AEROBIC AND ANAEROBIC 5CC     Culture NO GROWTH 1 DAY    Report Status PENDING   CBG monitoring, ED     Status: Abnormal   Collection Time: 09/03/15  2:46 AM  Result Value Ref Range   Glucose-Capillary 311 (H) 65 - 99 mg/dL  Glucose, capillary     Status: Abnormal   Collection Time: 09/03/15  4:36 AM  Result Value Ref Range   Glucose-Capillary 309 (H) 65 - 99 mg/dL   Comment 1 Notify RN   CBC     Status: Abnormal   Collection Time: 09/03/15  5:37 AM  Result Value Ref Range   WBC 9.0 4.0 - 10.5 K/uL   RBC 3.51 (L) 3.87 - 5.11 MIL/uL   Hemoglobin 10.4 (L) 12.0 - 15.0 g/dL   HCT 31.1 (L) 36.0 - 46.0 %   MCV 88.6 78.0 - 100.0 fL   MCH 29.6 26.0 - 34.0 pg   MCHC 33.4 30.0 - 36.0 g/dL   RDW 13.4 11.5 - 15.5 %   Platelets 293 150 - 400 K/uL  Basic metabolic panel     Status: Abnormal   Collection Time: 09/03/15  5:37 AM  Result Value Ref Range   Sodium 135 135 - 145 mmol/L   Potassium 3.7 3.5 - 5.1 mmol/L   Chloride 104 101 - 111 mmol/L   CO2 24 22 - 32 mmol/L   Glucose, Bld 327 (H) 65 - 99 mg/dL   BUN 18 6 - 20 mg/dL   Creatinine, Ser 0.73 0.44 - 1.00 mg/dL   Calcium 7.9 (L) 8.9 - 10.3 mg/dL   GFR  calc non Af Amer >60 >60 mL/min   GFR calc Af Amer >60 >60 mL/min    Comment: (NOTE) The eGFR has been calculated using the CKD EPI equation. This calculation has not been validated in all clinical situations. eGFR's persistently <60 mL/min signify possible Chronic Kidney Disease.    Anion gap 7 5 - 15  Glucose, capillary     Status: Abnormal   Collection Time: 09/03/15  9:23 AM  Result Value Ref Range   Glucose-Capillary 263 (H) 65 - 99 mg/dL   Comment 1 Notify RN   Glucose, capillary     Status: Abnormal   Collection Time: 09/03/15 12:07 PM  Result Value Ref Range   Glucose-Capillary 235 (H) 65 - 99 mg/dL   Comment 1 Notify RN   Glucose, capillary     Status: Abnormal   Collection Time: 09/03/15  2:38 PM  Result Value Ref Range   Glucose-Capillary 139 (H) 65 - 99 mg/dL  Anaerobic culture     Status: None (Preliminary result)   Collection Time: 09/03/15  4:04 PM  Result Value Ref Range   Specimen Description TISSUE LEFT LEG    Special Requests PART A DEEP TISSUE FROM LEFT LEG    Gram Stain      NO WBC SEEN NO SQUAMOUS EPITHELIAL CELLS SEEN NO ORGANISMS SEEN Performed at [facility]    Culture PENDING    Report Status PENDING   Fungus Culture with Smear     Status: None (Preliminary result)   Collection Time: 09/03/15  4:04 PM  Result Value Ref Range   Specimen Description TISSUE LEFT LEG  Special Requests PART A DEEP TISSUE FROM LEFT LEG    Fungal Smear      NO YEAST OR FUNGAL ELEMENTS SEEN Performed at [facility]    Culture      CULTURE IN PROGRESS FOR FOUR WEEKS Performed at [facility]    Report Status PENDING   AFB culture with smear     Status: None (Preliminary result)   Collection Time: 09/03/15  4:04 PM  Result Value Ref Range   Specimen Description TISSUE LEFT LEG    Special Requests PART A DEEP TISSUE FROM LEFT LEG    Acid Fast Smear      NO ACID FAST BACILLI SEEN Performed at [facility]    Culture       CULTURE WILL BE EXAMINED FOR 6 WEEKS BEFORE ISSUING A FINAL REPORT Performed at [facility]    Report Status PENDING   Tissue culture     Status: None (Preliminary result)   Collection Time: 09/03/15  4:04 PM  Result Value Ref Range   Specimen Description TISSUE    Special Requests PART A DEEP TISSUE FROM LEFT LEG    Gram Stain      NO WBC SEEN NO SQUAMOUS EPITHELIAL CELLS SEEN NO ORGANISMS SEEN Performed at [facility]    Culture NO GROWTH Performed at [facility]     Report Status PENDING   Anaerobic culture     Status: None (Preliminary result)   Collection Time: 09/03/15  4:14 PM  Result Value Ref Range   Specimen Description TISSUE LEFT LEG    Special Requests PART B SUPERFICIAL TISSUE FROM LEFT LEG    Gram Stain      NO WBC SEEN NO SQUAMOUS EPITHELIAL CELLS SEEN NO ORGANISMS SEEN Performed at [facility]    Culture PENDING    Report Status PENDING   Fungus Culture with Smear     Status: None (Preliminary result)   Collection Time: 09/03/15  4:14 PM  Result Value Ref Range   Specimen Description TISSUE LEFT LEG    Special Requests PART B SUPERFICIAL TISSUE FROM LEFT LEG    Fungal Smear      NO YEAST OR FUNGAL ELEMENTS SEEN Performed at [facility]    Culture      CULTURE IN PROGRESS FOR FOUR WEEKS Performed at [facility]    Report Status PENDING   AFB culture with smear     Status: None (Preliminary result)   Collection Time: 09/03/15  4:14 PM  Result Value Ref Range   Specimen Description TISSUE LEFT LEG    Special Requests PART B SUPERFICIAL TISSUE FROM LEFT LEG    Acid Fast Smear      NO ACID FAST BACILLI SEEN Performed at [facility]    Culture      CULTURE WILL BE EXAMINED FOR 6 WEEKS BEFORE ISSUING A FINAL REPORT Performed at [facility]    Report Status PENDING   Tissue culture     Status: None (Preliminary result)   Collection Time: 09/03/15  4:14 PM  Result Value Ref  Range   Specimen Description TISSUE    Special Requests PART B SUPERFICIAL TISSUE FROM LEFT LEG    Gram Stain      NO WBC SEEN NO SQUAMOUS EPITHELIAL CELLS SEEN NO ORGANISMS SEEN Performed at [facility]    Culture NO GROWTH Performed at [facility]     Report  Status PENDING   Glucose, capillary     Status: Abnormal   Collection Time: 09/03/15  4:56 PM  Result Value Ref Range   Glucose-Capillary 115 (H) 65 - 99 mg/dL  Glucose, capillary     Status: Abnormal   Collection Time: 09/03/15  6:29 PM  Result Value Ref Range   Glucose-Capillary 111 (H) 65 - 99 mg/dL   Comment 1 Notify RN   Glucose, capillary     Status: Abnormal   Collection Time: 09/03/15 10:30 PM  Result Value Ref Range   Glucose-Capillary 183 (H) 65 - 99 mg/dL  Glucose, capillary     Status: Abnormal   Collection Time: 09/04/15  1:28 AM  Result Value Ref Range   Glucose-Capillary 233 (H) 65 - 99 mg/dL   Comment 1 Notify RN   Glucose, capillary     Status: Abnormal   Collection Time: 09/04/15  4:06 AM  Result Value Ref Range   Glucose-Capillary 157 (H) 65 - 99 mg/dL  Glucose, capillary     Status: Abnormal   Collection Time: 09/04/15 12:01 PM  Result Value Ref Range   Glucose-Capillary 208 (H) 65 - 99 mg/dL    MICRO:  IMAGING: Dg Tibia/fibula Left  09/03/2015   CLINICAL DATA:  Purulent drainage at amputation stump.  EXAM: LEFT TIBIA AND FIBULA - 2 VIEW  COMPARISON:  06/26/2015  FINDINGS: No soft tissue gas is evident. No bony destruction is evident. There is no radiopaque foreign body.  IMPRESSION: Negative for soft tissue gas or bony destruction. MRI will be more sensitive and specific for osteomyelitis if additional imaging is clinically warranted.   Electronically Signed   By: Andreas Newport M.D.   On: 09/03/2015 01:13    HISTORICAL MICRO/IMAGING  Assessment/Plan:  Osteomyelitis at BKA stump Agree with current assessment and plan by primary and surgical services, broad spectrum  vanc/zosyn until microbiology results develop. Very high suspicion for recurrence of MSSA on review of history. CRP of 2.1, WBC 9.0, no systemic symptoms, hopefully there is limited total tissue mass involvement. Did not directly visualize wound due to recent dressing placement but ED images and surgical report reviewed.  -Revision and debridement are beneficial but patient will still need recommended 6 wks IV abtx -Continue vancomycin, zosyn for now -Initial cultures no isolates yet but only 1 day. ID will continue to follow and await additional microbiology findings for directing therapy.  Uncontrolled M0HW This almost certainly the driving mechanism for repeated wound healing failure and recurrent infections. Patient has history of foot cellulitis going back at least to 2012. Blood sugar control quite poor, last A1c 11.7% in July and self reported sugars of 200-300. She seems to have some history of past hypoglycemic episodes and resisted initial suggestion that insulin dose probably needs addressing. -Glucose control is being addressed by primary team, diabetes coordinator on board   Hinton Lovely Internal Medicine Resident PGY-I Pager: [phone]  09/04/2015, 5:04 PM

## 2015-09-04 NOTE — Progress Notes (Signed)
   Subjective:  Patient reports pain as mild.    Objective:   VITALS:   Filed Vitals:   09/03/15 1755 09/03/15 1800 09/03/15 2100 09/04/15 0000  BP: 145/72  135/75 130/53  Pulse:  94 97 101  Temp:  97.1 F (36.2 C) 97.8 F (36.6 C) 98.1 F (36.7 C)  TempSrc:   Oral Oral  Resp:  14 16 18   Weight:      SpO2:  95% 98% 100%    Neurologically intact Incision: dressing C/D/I and no drainage No cellulitis present Compartment soft   Lab Results  Component Value Date   WBC 9.0 09/03/2015   HGB 10.4* 09/03/2015   HCT 31.1* 09/03/2015   MCV 88.6 09/03/2015   PLT 293 09/03/2015     Assessment/Plan:  1 Day Post-Op   - Expected postop acute blood loss anemia - will monitor for symptoms - Up with PT/OT - DVT ppx - SCDs, ambulation - NWB operative extremity - cultures pending - recommend ID consult as infection appeared to involve bone - will need protracted course of abx - vanc/zosyn empirically  Marianna Payment 09/04/2015, 8:12 AM [phone]

## 2015-09-04 NOTE — Evaluation (Signed)
Physical Therapy Evaluation Patient Details Name: [name] MRN: [id number] DOB: 1966/02/14 Today's Date: 09/04/2015   History of Present Illness  [name] is a 49 y.o. 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 Complicated by wound infection and dehiscence 7/27, MSSA bacteremia treated with ancef completed course on 8/1. Wound has started draining purulent fluid again yesterday. Underwent stump revision on 09/03/15  Clinical Impression  Patient evaluated by Physical Therapy with no further acute PT needs identified. All education has been completed and the patient has no further questions. Pt mobilizing out of bed mod I and demonstrates proper performance of amputee exercises. See below for any follow-up Physial Therapy or equipment needs. PT is signing off. Thank you for this referral.     Follow Up Recommendations No PT follow up    Equipment Recommendations  None recommended by PT    Recommendations for Other Services       Precautions / Restrictions Precautions Precautions: None Restrictions Weight Bearing Restrictions: No      Mobility  Bed Mobility Overal bed mobility: Independent                Transfers Overall transfer level: Modified independent Equipment used: Rolling walker (2 wheeled)             General transfer comment: pt stands safely from various surfaces. reviewed proper positioning of hands  Ambulation/Gait Ambulation/Gait assistance: Modified independent (Device/Increase time) Ambulation Distance (Feet): 6 Feet Assistive device: Rolling walker (2 wheeled)       General Gait Details: pt safe with hopping on RLE with RW  Stairs            Wheelchair Mobility    Modified Rankin (Stroke Patients Only)       Balance Overall balance assessment: No apparent balance deficits (not formally assessed)                                           Pertinent Vitals/Pain Pain Assessment:  Faces Faces Pain Scale: Hurts little more Pain Location: left LE Pain Intervention(s): Repositioned;Monitored during session    Home Living Family/patient expects to be discharged to:: Private residence Living Arrangements: Spouse/significant other Available Help at Discharge: Friend(s);Available PRN/intermittently Type of Home: Apartment Home Access: Stairs to enter Entrance Stairs-Rails: Right Entrance Stairs-Number of Steps: 4 Home Layout: One level Home Equipment: Bedside commode;Walker - 2 wheels;Wheelchair - manual Additional Comments: pt's husband pulls pt's w/c up stairs. She has been mobilizing well at home since BKA in July    Prior Function Level of Independence: Independent with assistive device(s)         Comments: pt has continues to cook and perform household tasks, does from w/c. Husband does the grocery shopping     Hand Dominance   Dominant Hand: Left    Extremity/Trunk Assessment   Upper Extremity Assessment: Overall WFL for tasks assessed           Lower Extremity Assessment: Overall WFL for tasks assessed      Cervical / Trunk Assessment: Normal  Communication   Communication: Prefers language other than English (spanish)  Cognition Arousal/Alertness: Awake/alert Behavior During Therapy: WFL for tasks assessed/performed Overall Cognitive Status: Within Functional Limits for tasks assessed                      General Comments General comments (skin integrity, edema,  etc.): Discussed equipment and preparation for prosthesis when leg heals    Exercises Amputee Exercises Quad Sets: AROM;Left;10 reps Knee Flexion: AROM;Left;10 reps Straight Leg Raises: AROM;Left;10 reps      Assessment/Plan    PT Assessment Patent does not need any further PT services  PT Diagnosis Acute pain;Abnormality of gait   PT Problem List    PT Treatment Interventions     PT Goals (Current goals can be found in the Care Plan section) Acute Rehab PT  Goals Patient Stated Goal: return home    Frequency     Barriers to discharge        Co-evaluation               End of Session Equipment Utilized During Treatment: Gait belt Activity Tolerance: Patient tolerated treatment well Patient left: in chair;with call bell/phone within reach;with family/visitor present Nurse Communication: Mobility status         Time: 4734-0370 PT Time Calculation (min) (ACUTE ONLY): 22 min   Charges:   PT Evaluation $Initial PT Evaluation Tier I: 1 Procedure     PT G Codes:       Leighton Roach, PT  Acute Rehab Services  [place], [place] 09/04/2015, 1:32 PM

## 2015-09-05 DIAGNOSIS — R509 Fever, unspecified: Secondary | ICD-10-CM

## 2015-09-05 DIAGNOSIS — Y92230 Patient room in hospital as the place of occurrence of the external cause: Secondary | ICD-10-CM

## 2015-09-05 DIAGNOSIS — B9561 Methicillin susceptible Staphylococcus aureus infection as the cause of diseases classified elsewhere: Secondary | ICD-10-CM

## 2015-09-05 DIAGNOSIS — W010XXA Fall on same level from slipping, tripping and stumbling without subsequent striking against object, initial encounter: Secondary | ICD-10-CM

## 2015-09-05 LAB — GLUCOSE, CAPILLARY
GLUCOSE-CAPILLARY: 233 mg/dL — AB (ref 65–99)
GLUCOSE-CAPILLARY: 239 mg/dL — AB (ref 65–99)
GLUCOSE-CAPILLARY: 213 mg/dL — AB (ref 65–99)
Glucose-Capillary: 88 mg/dL (ref 65–99)

## 2015-09-05 MED ORDER — INSULIN ASPART 100 UNIT/ML ~~LOC~~ SOLN
10.0000 [IU] | Freq: Three times a day (TID) | SUBCUTANEOUS | Status: DC
  Administered 2015-09-05 – 2015-09-06 (×3): 10 [IU] via SUBCUTANEOUS

## 2015-09-05 MED ORDER — ONDANSETRON 4 MG PO TBDP
4.0000 mg | ORAL_TABLET | Freq: Three times a day (TID) | ORAL | Status: DC
  Administered 2015-09-05 – 2015-09-07 (×6): 4 mg via ORAL
  Filled 2015-09-05 (×11): qty 1

## 2015-09-05 MED ORDER — CEFAZOLIN SODIUM 1-5 GM-% IV SOLN
1.0000 g | Freq: Three times a day (TID) | INTRAVENOUS | Status: DC
  Administered 2015-09-05 – 2015-09-06 (×2): 1 g via INTRAVENOUS
  Filled 2015-09-05 (×4): qty 50

## 2015-09-05 MED ORDER — INSULIN GLARGINE 100 UNIT/ML ~~LOC~~ SOLN
25.0000 [IU] | Freq: Every day | SUBCUTANEOUS | Status: DC
  Administered 2015-09-05 – 2015-09-06 (×2): 25 [IU] via SUBCUTANEOUS
  Filled 2015-09-05 (×3): qty 0.25

## 2015-09-05 NOTE — Hospital Discharge Follow-Up (Signed)
Transitional Care Clinic Care Coordination Note:  Admit date:  09/02/15 Discharge date: TBD Discharge Disposition: Home with spouse; ? Home infusion services Patient contact: [phone] (mobile) Emergency contact(s): [phone] (spouse-Pedro)  This Case Manager reviewed patient's EMR and determined patient would benefit from post-discharge medical management and chronic care management services through the [facility]. Patient has a history of uncontrolled diabetes mellitus type 2, anemia, osteomyelitis. Patient currently receiving treatment for left below the knee stump abscess, osteomyelitis, diabetes mellitus. Patient has had 6 inpatient admissions in the last six months.  This Case Manager met with patient to discuss the services and medical management that can be provided at the [facility]. Patient verbalized understanding and agreed to receive post-discharge care at the [facility]. [employer] telephonic interpreting used for language translation-Interpretor # [phone].  Patient scheduled for Transitional Care appointment on 09/12/15 at 1115 with Dr. Jarold Song.  Clinic information and appointment time provided to patient. Appointment information also placed on AVS.  Assessment:       Home Environment: Patient lives with her spouse in an apartment.       Support System: spouse       Level of functioning: Independent       Home DME: wheelchair       Home care services: none prior to admission; however, patient indicates she has had home health services in the past ([facility] for home infusion services)       Transportation: Patient indicates her spouse normally brings her to her medical appointments.        Food/Nutrition: Patient indicates she has access to needed food. She does not get Physicist, medical.        Medications: Patient indicates she normally gets medications from the pharmacy at [facility]. Reiterated  pharmacy resources available at [place].        Identified Barriers: uninsured-Patient would benefit from meeting with on-site Financial Counselor at [facility] and [facility] after discharge. Language is another barrier. Patient's primary language is [place].        PCP: Dr. Annitta Needs was patient's PCP. Patient will need to establish with another physician at [facility] and [facility] after 30 days of medical management with [facility].  Patient Education:            Arranged services:        Services communicated to Fuller Plan, RN CM

## 2015-09-05 NOTE — Progress Notes (Signed)
   Subjective:  Patient reports pain as mild.    Objective:   VITALS:   Filed Vitals:   09/04/15 0400 09/04/15 1444 09/04/15 2028 09/05/15 0632  BP: 111/53 100/54 100/49 115/51  Pulse: 89 93 95 79  Temp: 99.1 F (37.3 C) 98.1 F (36.7 C) 100.6 F (38.1 C) 98.8 F (37.1 C)  TempSrc: Oral Oral    Resp:  17 16 16   Weight:      SpO2: 99% 97% 98% 100%    Neurologically intact Incision: dressing C/D/I and no drainage No cellulitis present Compartment soft  Incision c/d/i   Lab Results  Component Value Date   WBC 9.0 09/03/2015   HGB 10.4* 09/03/2015   HCT 31.1* 09/03/2015   MCV 88.6 09/03/2015   PLT 293 09/03/2015     Assessment/Plan:  2 Days Post-Op   - dressings changed - reinforced the importance of diabetic control - NWB - f/u 1 week for wound check - few staph on one of the deep cultures from OR  Marianna Payment 09/05/2015, 7:55 AM [phone]

## 2015-09-05 NOTE — Progress Notes (Signed)
PROGRESS NOTE  [name] XWR:[id number] DOB: Dec 02, 1965 DOA: 09/02/2015 PCP: Doris Cheadle, MD  Assessment/Plan:  Left below the knee amputation stump abscess, osteomyelitis - Up with PT/OT - DVT ppx - SCDs, ambulation - NWB operative extremity - cultures show: few staph - ID consult-- 6 weeks of abx?-- ? If IV needed-- has had PICC in past and gotten infected - vanc/zosyn until changed by ID  ABLA -trend  DM -lantus- increase for tighter control -add novolog with meals -SSI  Code Status: full Family Communication: patient Disposition Plan:    Consultants:  Ortho  ID  Procedures:      HPI/Subjective: Spoke with patient and son via phone translator Anxious to go home   Objective: Filed Vitals:   09/05/15 0632  BP: 115/51  Pulse: 79  Temp: 98.8 F (37.1 C)  Resp: 16    Intake/Output Summary (Last 24 hours) at 09/05/15 1107 Last data filed at 09/05/15 0900  Gross per 24 hour  Intake    720 ml  Output      0 ml  Net    720 ml   Filed Weights   09/03/15 0319  Weight: 57.3 kg (126 lb 5.2 oz)    Exam:   General:  Awake, NAD  Cardiovascular: rrr  Respiratory: clear  Abdomen: +BS, soft  Musculoskeletal: no edema   Data Reviewed: Basic Metabolic Panel:  Recent Labs Lab 09/02/15 1918 09/03/15 0537  NA 133* 135  K 3.8 3.7  CL 98* 104  CO2 29 24  GLUCOSE 305* 327*  BUN 19 18  CREATININE 1.04* 0.73  CALCIUM 8.6* 7.9*   Liver Function Tests: No results for input(s): AST, ALT, ALKPHOS, BILITOT, PROT, ALBUMIN in the last 168 hours. No results for input(s): LIPASE, AMYLASE in the last 168 hours. No results for input(s): AMMONIA in the last 168 hours. CBC:  Recent Labs Lab 09/02/15 1918 09/03/15 0537  WBC 8.9 9.0  HGB 11.6* 10.4*  HCT 34.6* 31.1*  MCV 87.4 88.6  PLT 339 293   Cardiac Enzymes: No results for input(s): CKTOTAL, CKMB, CKMBINDEX, TROPONINI in the last 168 hours. BNP (last 3 results)  Recent  Labs  06/02/15 0415  BNP 194.6*    ProBNP (last 3 results) No results for input(s): PROBNP in the last 8760 hours.  CBG:  Recent Labs Lab 09/04/15 0406 09/04/15 1201 09/04/15 1658 09/04/15 2148 09/05/15 0635  GLUCAP 157* 208* 299* 169* 233*    Recent Results (from the past 240 hour(s))  Wound culture     Status: None (Preliminary result)   Collection Time: 09/03/15  1:15 AM  Result Value Ref Range Status   Specimen Description WOUND LEFT LEG  Final   Special Requests NONE  Final   Gram Stain   Final    FEW WBC PRESENT,BOTH PMN AND MONONUCLEAR NO SQUAMOUS EPITHELIAL CELLS SEEN NO ORGANISMS SEEN Performed at [facility]    Culture   Final    FEW STAPHYLOCOCCUS AUREUS Note: RIFAMPIN AND GENTAMICIN SHOULD NOT BE USED AS SINGLE DRUGS FOR TREATMENT OF STAPH INFECTIONS. Performed at [facility]    Report Status PENDING  Incomplete  Blood culture (routine x 2)     Status: None (Preliminary result)   Collection Time: 09/03/15  1:23 AM  Result Value Ref Range Status   Specimen Description BLOOD RIGHT FOREARM  Final   Special Requests BOTTLES DRAWN AEROBIC AND ANAEROBIC 5CC   Final   Culture NO GROWTH 1 DAY  Final  Report Status PENDING  Incomplete  Blood culture (routine x 2)     Status: None (Preliminary result)   Collection Time: 09/03/15  1:29 AM  Result Value Ref Range Status   Specimen Description BLOOD LEFT ANTECUBITAL  Final   Special Requests BOTTLES DRAWN AEROBIC AND ANAEROBIC 5CC   Final   Culture NO GROWTH 1 DAY  Final   Report Status PENDING  Incomplete  Anaerobic culture     Status: None (Preliminary result)   Collection Time: 09/03/15  4:04 PM  Result Value Ref Range Status   Specimen Description TISSUE LEFT LEG  Final   Special Requests PART A DEEP TISSUE FROM LEFT LEG  Final   Gram Stain   Final    NO WBC SEEN NO SQUAMOUS EPITHELIAL CELLS SEEN NO ORGANISMS SEEN Performed at [facility]    Culture   Final    NO  ANAEROBES ISOLATED; CULTURE IN PROGRESS FOR 5 DAYS Performed at [facility]    Report Status PENDING  Incomplete  Fungus Culture with Smear     Status: None (Preliminary result)   Collection Time: 09/03/15  4:04 PM  Result Value Ref Range Status   Specimen Description TISSUE LEFT LEG  Final   Special Requests PART A DEEP TISSUE FROM LEFT LEG  Final   Fungal Smear   Final    NO YEAST OR FUNGAL ELEMENTS SEEN Performed at [facility]    Culture   Final    CULTURE IN PROGRESS FOR FOUR WEEKS Performed at [facility]    Report Status PENDING  Incomplete  AFB culture with smear     Status: None (Preliminary result)   Collection Time: 09/03/15  4:04 PM  Result Value Ref Range Status   Specimen Description TISSUE LEFT LEG  Final   Special Requests PART A DEEP TISSUE FROM LEFT LEG  Final   Acid Fast Smear   Final    NO ACID FAST BACILLI SEEN Performed at [facility]    Culture   Final    CULTURE WILL BE EXAMINED FOR 6 WEEKS BEFORE ISSUING A FINAL REPORT Performed at [facility]    Report Status PENDING  Incomplete  Tissue culture     Status: None (Preliminary result)   Collection Time: 09/03/15  4:04 PM  Result Value Ref Range Status   Specimen Description TISSUE  Final   Special Requests PART A DEEP TISSUE FROM LEFT LEG  Final   Gram Stain   Final    NO WBC SEEN NO SQUAMOUS EPITHELIAL CELLS SEEN NO ORGANISMS SEEN Performed at [facility]    Culture   Final    FEW STAPHYLOCOCCUS AUREUS Performed at [facility]    Report Status PENDING  Incomplete  Anaerobic culture     Status: None (Preliminary result)   Collection Time: 09/03/15  4:14 PM  Result Value Ref Range Status   Specimen Description TISSUE LEFT LEG  Final   Special Requests PART B SUPERFICIAL TISSUE FROM LEFT LEG  Final   Gram Stain   Final    NO WBC SEEN NO SQUAMOUS EPITHELIAL CELLS SEEN NO ORGANISMS SEEN Performed at [facility]     Culture   Final    NO ANAEROBES ISOLATED; CULTURE IN PROGRESS FOR 5 DAYS Performed at [facility]    Report Status PENDING  Incomplete  Fungus Culture with Smear     Status: None (Preliminary result)   Collection  Time: 09/03/15  4:14 PM  Result Value Ref Range Status   Specimen Description TISSUE LEFT LEG  Final   Special Requests PART B SUPERFICIAL TISSUE FROM LEFT LEG  Final   Fungal Smear   Final    NO YEAST OR FUNGAL ELEMENTS SEEN Performed at [facility]    Culture   Final    CULTURE IN PROGRESS FOR FOUR WEEKS Performed at [facility]    Report Status PENDING  Incomplete  AFB culture with smear     Status: None (Preliminary result)   Collection Time: 09/03/15  4:14 PM  Result Value Ref Range Status   Specimen Description TISSUE LEFT LEG  Final   Special Requests PART B SUPERFICIAL TISSUE FROM LEFT LEG  Final   Acid Fast Smear   Final    NO ACID FAST BACILLI SEEN Performed at [facility]    Culture   Final    CULTURE WILL BE EXAMINED FOR 6 WEEKS BEFORE ISSUING A FINAL REPORT Performed at [facility]    Report Status PENDING  Incomplete  Tissue culture     Status: None (Preliminary result)   Collection Time: 09/03/15  4:14 PM  Result Value Ref Range Status   Specimen Description TISSUE  Final   Special Requests PART B SUPERFICIAL TISSUE FROM LEFT LEG  Final   Gram Stain   Final    NO WBC SEEN NO SQUAMOUS EPITHELIAL CELLS SEEN NO ORGANISMS SEEN Performed at [facility]    Culture   Final    NO GROWTH 1 DAY Performed at [facility]    Report Status PENDING  Incomplete     Studies: No results found.  Scheduled Meds: . insulin aspart  0-15 Units Subcutaneous TID WC  . insulin aspart  0-5 Units Subcutaneous QHS  . insulin aspart  10 Units Subcutaneous TID WC  . insulin glargine  25 Units Subcutaneous Q2200  . piperacillin-tazobactam (ZOSYN)  IV  3.375 g Intravenous Q8H  . sodium chloride  3 mL  Intravenous Q12H  . vancomycin  1,000 mg Intravenous Q12H   Continuous Infusions:  Antibiotics Given (last 72 hours)    Date/Time Action Medication Dose Rate   09/03/15 0942 Given  [Iv inflitrated]   piperacillin-tazobactam (ZOSYN) IVPB 3.375 g 3.375 g 12.5 mL/hr   09/03/15 1042 Given   vancomycin (VANCOCIN) IVPB 1000 mg/200 mL premix 1,000 mg 200 mL/hr   09/03/15 2158 Given   vancomycin (VANCOCIN) IVPB 1000 mg/200 mL premix 1,000 mg 200 mL/hr   09/04/15 0157 Given   piperacillin-tazobactam (ZOSYN) IVPB 3.375 g 3.375 g 12.5 mL/hr   09/04/15 1145 Given   vancomycin (VANCOCIN) IVPB 1000 mg/200 mL premix 1,000 mg 200 mL/hr   09/04/15 1145 Given   piperacillin-tazobactam (ZOSYN) IVPB 3.375 g 3.375 g 12.5 mL/hr   09/04/15 1833 Given   piperacillin-tazobactam (ZOSYN) IVPB 3.375 g 3.375 g 12.5 mL/hr   09/04/15 2237 Given   vancomycin (VANCOCIN) IVPB 1000 mg/200 mL premix 1,000 mg 200 mL/hr   09/05/15 0222 Given   piperacillin-tazobactam (ZOSYN) IVPB 3.375 g 3.375 g 12.5 mL/hr   09/05/15 0933 Given   vancomycin (VANCOCIN) IVPB 1000 mg/200 mL premix 1,000 mg 200 mL/hr   09/05/15 1105 Given   piperacillin-tazobactam (ZOSYN) IVPB 3.375 g 3.375 g 12.5 mL/hr      Principal Problem:   Infection of amputation stump of left lower extremity (HCC) Active Problems:   Diabetes mellitus type 2, controlled (HCC)  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 If 7PM-7AM, please contact night-coverage at [email or web address], password [facility] 09/05/2015, 11:07 AM  LOS: 2 days

## 2015-09-05 NOTE — Progress Notes (Signed)
[place] for Infectious Disease    Date of Admission:  09/02/2015   Total days of antibiotics 3        Day 10/4-10/6 vancomycin, zosyn   ID: [name] is a 49 y.o. female with recurring LLE infection and revised BKA amputation in setting of poorly controlled T2DM. Surgery was on 10/4 with bone involvement observed and wound cultures obtained now growing staph aureus.  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 Today she was very frustrated and complained about tripping when trying to walk to the bathroom unassisted. IV line was displaced partially and very minor bleeding occurred. She has some nausea worsened on her IV medications.  Medications:  .  ceFAZolin (ANCEF) IV  1 g Intravenous 3 times per day  . insulin aspart  0-15 Units Subcutaneous TID WC  . insulin aspart  0-5 Units Subcutaneous QHS  . insulin aspart  10 Units Subcutaneous TID WC  . insulin glargine  25 Units Subcutaneous Q2200  . ondansetron  4 mg Oral 3 times per day  . sodium chloride  3 mL Intravenous Q12H    Objective: Vital signs in last 24 hours: Temp:  [98 F (36.7 C)-100.6 F (38.1 C)] 98 F (36.7 C) (10/06 1348) Pulse Rate:  [79-95] 94 (10/06 1348) Resp:  [16-17] 17 (10/06 1348) BP: (100-115)/(49-58) 109/58 mmHg (10/06 1348) SpO2:  [98 %-100 %] 100 % (10/06 1348)   GENERAL- alert, co-operative, NAD HEENT- Atraumatic, oral mucosa appears moist, no cervical LN enlargement CARDIAC- RRR, no murmurs, rubs or gallops. RESP- CTAB, no wheezes or crackles. ABDOMEN- Soft, nontender, no guarding or rebound, normoactive bowel sounds present EXTREMITIES- L BKA, with ACE dressing in place c/d/i, no visible swelling, erythema, drainage SKIN- Warm, dry, No  rash or lesion. PSYCH- Frustrated, appropriate thought content and speech.  Lab Results  Recent Labs  09/02/15 1918 09/03/15 0537  WBC 8.9 9.0  HGB 11.6* 10.4*  HCT 34.6* 31.1*  NA 133* 135  K 3.8 3.7  CL 98* 104  CO2 29 24  BUN 19 18  CREATININE 1.04* 0.73   Liver Panel No results for input(s): PROT, ALBUMIN, AST, ALT, ALKPHOS, BILITOT, BILIDIR, IBILI in the last 72 hours. Sedimentation Rate No results for input(s): ESRSEDRATE in the last 72 hours. C-Reactive Protein  Recent Labs  09/03/15 0122  CRP 2.1*    Microbiology:  Studies/Results: No results found.   Assessment/Plan: Osteomyelitis at BKA stump Low fever of 100.6 o/n. Otherwise vitals are good. Culture growing staph that is most likely consistent with the past bacteria present in July-August. Unlikely to have converted to MRSA since that time and will restart therapy. Serum cultures are NGTD and she may be a candidate for PICC placement tomorrow or Saturday if cultures remain negative. Discussed with patient that travelling should be deferred until after completing her course of therapy for this infection. -Patient will still need at least 6 wks IV abtx from debridement (09/03/15) -Change vanc/zosyn to Ancef IV -Initial cultures staph, MICs pending will obtain tmrw. If needed will alter regimen -Maybe place PICC tmrw if 10/4 blood cultures remain negative for min 3 days  Uncontrolled T2DM LAst A1c 11.7%. Still pretty high over past 24hrs, 169-299 -Glucose control is being addressed by  primary team, diabetes coordinator on board  Fall Patient had a lot of frustration today related to her ambulation status, some nausea from antibiotics that was not treated due to miscommunication possibly. We made some changes to current treatment plan based on this. Also change in antibiotics narrowing coverage might have less side effect. -Provide bedside commode to minimize walking assist needs -D/C cardiac  monitoring -Scheduled zofran for nausea control   Hinton Lovely Internal Medicine Resident PGY-I Pager: [phone]  09/05/2015, 4:42 PM

## 2015-09-06 ENCOUNTER — Other Ambulatory Visit: Payer: Self-pay | Admitting: Internal Medicine

## 2015-09-06 DIAGNOSIS — L0291 Cutaneous abscess, unspecified: Secondary | ICD-10-CM

## 2015-09-06 LAB — GLUCOSE, CAPILLARY
GLUCOSE-CAPILLARY: 193 mg/dL — AB (ref 65–99)
GLUCOSE-CAPILLARY: 250 mg/dL — AB (ref 65–99)
Glucose-Capillary: 202 mg/dL — ABNORMAL HIGH (ref 65–99)
Glucose-Capillary: 178 mg/dL — ABNORMAL HIGH (ref 65–99)

## 2015-09-06 LAB — WOUND CULTURE

## 2015-09-06 MED ORDER — CEFAZOLIN SODIUM-DEXTROSE 2-3 GM-% IV SOLR: 2.0000 g | Freq: Three times a day (TID) | INTRAVENOUS | Status: DC

## 2015-09-06 MED ORDER — CEFAZOLIN SODIUM-DEXTROSE 2-3 GM-% IV SOLR
2.0000 g | Freq: Three times a day (TID) | INTRAVENOUS | Status: DC
  Administered 2015-09-06 – 2015-09-07 (×4): 2 g via INTRAVENOUS
  Filled 2015-09-06 (×6): qty 50

## 2015-09-06 MED ORDER — INSULIN ASPART 100 UNIT/ML ~~LOC~~ SOLN
6.0000 [IU] | Freq: Three times a day (TID) | SUBCUTANEOUS | Status: DC
  Administered 2015-09-06 – 2015-09-07 (×3): 6 [IU] via SUBCUTANEOUS

## 2015-09-06 NOTE — Progress Notes (Signed)
    [facility] for Infectious Disease    Date of Admission:  09/02/2015   Total days of antibiotics 3        Day 10/4-10/6 vancomycin, zosyn   ID: [name] is a 49 y.o. female with recurring LLE infection and revised BKA amputation in setting of poorly controlled T2DM. Surgery was on 10/4 with bone involvement observed and wound cultures obtained now growing MSSA.  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 Will not have PICC vascular access until IR can perform this, likely tmrw AM. Blood cultures continue to be negative. Patient slightly less frustrated than yesterday.  Medications:  .  ceFAZolin (ANCEF) IV  2 g Intravenous 3 times per day  . insulin aspart  0-15 Units Subcutaneous TID WC  . insulin aspart  0-5 Units Subcutaneous QHS  . insulin aspart  6 Units Subcutaneous TID WC  . insulin glargine  25 Units Subcutaneous Q2200  . ondansetron  4 mg Oral 3 times per day  . sodium chloride  3 mL Intravenous Q12H    Objective: Vital signs in last 24 hours: Temp:  [97.7 F (36.5 C)-99.5 F (37.5 C)] 99.5 F (37.5 C) (10/07 1608) Pulse Rate:  [81-96] 91 (10/07 1608) Resp:  [18] 18 (10/07 1608) BP: (103-122)/(56-67) 122/63 mmHg (10/07 1608) SpO2:  [98 %-100 %] 98 % (10/07 1608)   GENERAL- alert, co-operative, NAD HEENT- Atraumatic, oral mucosa appears moist, no cervical LN enlargement CARDIAC- RRR, no murmurs, rubs or gallops. RESP- CTAB, no wheezes or crackles. ABDOMEN- Soft, nontender, no guarding or rebound, normoactive bowel sounds present EXTREMITIES- L BKA, with ACE dressing in place c/d/i, no visible swelling, erythema, drainage SKIN- Warm, dry, No rash or lesion. PSYCH- Frustrated, appropriate  thought content and speech.  Lab Results No results for input(s): WBC, HGB, HCT, NA, K, CL, CO2, BUN, CREATININE, GLU in the last 72 hours.  Invalid input(s): PLATELETS Liver Panel No results for input(s): PROT, ALBUMIN, AST, ALT, ALKPHOS, BILITOT, BILIDIR, IBILI in the last 72 hours. Sedimentation Rate No results for input(s): ESRSEDRATE in the last 72 hours. C-Reactive Protein No results for input(s): CRP in the last 72 hours.  Microbiology:  Studies/Results: No results found.   Assessment/Plan: Osteomyelitis at BKA stump Afebrile, good vitals. She is now >72 hrs blood culture negative. PICC line delayed due to needing IR access because of poor vasculature. Her previous PICC infection is likely from poor maintenance, need to reinforce proper care and use of her access with education or else she is quite likely to not preserve this one out to 6 weeks. Otherwise should be in good condition to leave and continue with home health assistance. -Patient will still need at least 6 wks IV ancef from debridement (09/03/15) -Place PICC tmrw -Repeat education about PICC care and use  Uncontrolled T2DM LAst A1c 11.7%. Still pretty high over past 24hrs, 169-299 -Glucose control is being addressed by primary team, diabetes coordinator on board  Fall Did not fall again today  Hinton Lovely Internal Medicine Resident PGY-I Pager: [phone]  09/06/2015, 6:30 PM

## 2015-09-06 NOTE — Discharge Summary (Addendum)
Physician Discharge Summary  [name] EKC:[id number] DOB: September 23, 1966 DOA: 09/02/2015  PCP: Lorayne Marek, MD  Admit date: 09/02/2015 Discharge date: 09/07/2015  Time spent: 35 minutes  Recommendations for Outpatient Follow-up:  1. Cbc, bmp weekly for 6 weeks while on IV abx 2. 11/15 stop date of abx 3. Tight blood sugar  Discharge Diagnoses:  Principal Problem:   Infection of amputation stump of left lower extremity ([place]) Active Problems:   Diabetes mellitus type 2, controlled ([place])   Status post below knee amputation of left lower extremity ([place])   Wound dehiscence   Discharge Condition: improved  Diet recommendation: diabetic  Filed Weights   09/03/15 0319  Weight: 57.3 kg (126 lb 5.2 oz)    History of present illness:  [name] is a 49 y.o. 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 Complicated by wound infection and dehiscence 7/27, MSSA bacteremia treated with ancef completed course on 8/1. Wound has started draining purulent fluid again yesterday. She presents to ED today.  Hospital Course:  Left below the knee amputation stump abscess, osteomyelitis - NWB operative extremity - cultures show: few staph- staph aur - ID consult-- 6 weeks of abx- ancef- 6 weeks -On 09/07/2015 she had PICC line placed. Tolerated procedure well, there where no immediate complications  DM -lantus- needs tighter control -cont novolog with meals -patient says she can afford her meds -set up with PCP -will discharge on Lantus 25 units daily.   Procedures: Irrigation and debridement of muscle, soft tissue, skin 5 x 5 cm 2. Revision amputation of left below the knee amputation 3. Adjacent tissue rearrangement 5 x 4 cm  Consultations:  ortho  Discharge Exam: Filed Vitals:   09/06/15 0600  BP: 119/56  Pulse: 81  Temp: 97.7 F (36.5 C)  Resp: 18    General: Awake, NAD  Cardiovascular: rrr  Respiratory: clear  Abdomen: +BS,  soft  Musculoskeletal: no edema  Discharge Instructions   Discharge Instructions    Diet - low sodium heart healthy    Complete by:  As directed      Diet Carb Modified    Complete by:  As directed      Discharge instructions    Complete by:  As directed   Weekly CBC, BMP- sent to ID clinic     Increase activity slowly    Complete by:  As directed           Current Discharge Medication List    START taking these medications   Details  ceFAZolin (ANCEF) 2-3 GM-% SOLR Inject 50 mLs (2 g total) into the vein every 8 (eight) hours. Qty: 75 each, Refills: 0      CONTINUE these medications which have NOT CHANGED   Details  acetaminophen (TYLENOL) 325 MG tablet Take 2 tablets (650 mg total) by mouth every 6 (six) hours as needed for mild pain or moderate pain. Qty: 100 tablet, Refills: 0    glucose blood (TRUE METRIX BLOOD GLUCOSE TEST) test strip USE TO TEST BLOOD SUGAR 3 TIMES DAILY Qty: 100 each, Refills: 12    insulin aspart (NOVOLOG FLEXPEN) 100 UNIT/ML FlexPen Inject 10 Units into the skin 3 (three) times daily with meals. Qty: 15 mL, Refills: 1   Associated Diagnoses: Other specified diabetes mellitus without complications (HCC)    Insulin Glargine (LANTUS SOLOSTAR) 100 UNIT/ML Solostar Pen Inject 25 Units into the skin daily at 10 pm. Qty: 15 mL, Refills: 2   Associated Diagnoses: Other specified diabetes mellitus  without complications (HCC)    Insulin Pen Needle 32G X 4 MM MISC 1 application by Does not apply route 4 (four) times daily -  with meals and at bedtime. Qty: 100 each, Refills: 1    iron polysaccharides (NIFEREX) 150 MG capsule Take 1 capsule (150 mg total) by mouth 2 times daily at 12 noon and 4 pm. Qty: 60 capsule, Refills: 1    Multiple Vitamin (MULTIVITAMIN WITH MINERALS) TABS tablet Take 1 tablet by mouth daily. Qty: 30 tablet, Refills: 1    polycarbophil (FIBERCON) 625 MG tablet Take 1 tablet (625 mg total) by mouth 2 (two) times daily. Qty: 60  tablet, Refills: 1    simethicone (MYLICON) 80 MG chewable tablet Chew 1 tablet (80 mg total) by mouth 4 (four) times daily as needed for flatulence (for gas and bloating). Qty: 30 tablet, Refills: 0    traMADol (ULTRAM) 50 MG tablet Take 0.5-1 tablets (25-50 mg total) by mouth every 6 (six) hours as needed for severe pain. Qty: 50 tablet, Refills: 0    TRUEPLUS LANCETS 28G MISC USE TO TEST BLOOD SUGAR 3 TIMES A DAY Qty: 100 each, Refills: 12      STOP taking these medications     Blood Glucose Monitoring Suppl (TRUE METRIX METER) DEVI        No Known Allergies Follow-up Information    Follow up with [facility]     On 09/12/2015.   Why:  Transitional Care Clinic appointment on 09/12/15 at 11:15 am with Dr. Jarold Song.   Contact information:   [address] [place] [address] [phone]      Follow up with [facility].   Why:  They will contact you to schedule home therapy visits.    Contact information:   [address] [place] [address] [phone]        The results of significant diagnostics from this hospitalization (including imaging, microbiology, ancillary and laboratory) are listed below for reference.    Significant Diagnostic Studies: Dg Tibia/fibula Left  09/03/2015   CLINICAL DATA:  Purulent drainage at amputation stump.  EXAM: LEFT TIBIA AND FIBULA - 2 VIEW  COMPARISON:  06/26/2015  FINDINGS: No soft tissue gas is evident. No bony destruction is evident. There is no radiopaque foreign body.  IMPRESSION: Negative for soft tissue gas or bony destruction. MRI will be more sensitive and specific for osteomyelitis if additional imaging is clinically warranted.   Electronically Signed   By: Andreas Newport M.D.   On: 09/03/2015 01:13    Microbiology: Recent Results (from the past 240 hour(s))  Wound culture     Status: None   Collection Time: 09/03/15  1:15 AM  Result Value Ref  Range Status   Specimen Description WOUND LEFT LEG  Final   Special Requests NONE  Final   Gram Stain   Final    FEW WBC PRESENT,BOTH PMN AND MONONUCLEAR NO SQUAMOUS EPITHELIAL CELLS SEEN NO ORGANISMS SEEN Performed at [facility]    Culture   Final    FEW STAPHYLOCOCCUS AUREUS Note: RIFAMPIN AND GENTAMICIN SHOULD NOT BE USED AS SINGLE DRUGS FOR TREATMENT OF STAPH INFECTIONS. This organism is presumed to be Clindamycin resistant based on detection of inducible Clindamycin resistance. Performed at [facility]    Report Status 09/06/2015 FINAL  Final   Organism ID, Bacteria STAPHYLOCOCCUS AUREUS  Final      Susceptibility   Staphylococcus aureus -  MIC*    CLINDAMYCIN RESISTANT      ERYTHROMYCIN >=8 RESISTANT Resistant     GENTAMICIN <=0.5 SENSITIVE Sensitive     LEVOFLOXACIN 0.25 SENSITIVE Sensitive     OXACILLIN <=0.25 SENSITIVE Sensitive     RIFAMPIN <=0.5 SENSITIVE Sensitive     TRIMETH/SULFA <=10 SENSITIVE Sensitive     VANCOMYCIN <=0.5 SENSITIVE Sensitive     TETRACYCLINE >=16 RESISTANT Resistant     MOXIFLOXACIN <=0.25 SENSITIVE Sensitive     * FEW STAPHYLOCOCCUS AUREUS  Blood culture (routine x 2)     Status: None (Preliminary result)   Collection Time: 09/03/15  1:23 AM  Result Value Ref Range Status   Specimen Description BLOOD RIGHT FOREARM  Final   Special Requests BOTTLES DRAWN AEROBIC AND ANAEROBIC 5CC   Final   Culture NO GROWTH 3 DAYS  Final   Report Status PENDING  Incomplete  Blood culture (routine x 2)     Status: None (Preliminary result)   Collection Time: 09/03/15  1:29 AM  Result Value Ref Range Status   Specimen Description BLOOD LEFT ANTECUBITAL  Final   Special Requests BOTTLES DRAWN AEROBIC AND ANAEROBIC 5CC   Final   Culture NO GROWTH 3 DAYS  Final   Report Status PENDING  Incomplete  Anaerobic culture     Status: None (Preliminary result)   Collection Time: 09/03/15  4:04 PM  Result Value Ref Range Status   Specimen Description  TISSUE LEFT LEG  Final   Special Requests PART A DEEP TISSUE FROM LEFT LEG  Final   Gram Stain   Final    NO WBC SEEN NO SQUAMOUS EPITHELIAL CELLS SEEN NO ORGANISMS SEEN Performed at [facility]    Culture   Final    NO ANAEROBES ISOLATED; CULTURE IN PROGRESS FOR 5 DAYS Performed at [facility]    Report Status PENDING  Incomplete  Fungus Culture with Smear     Status: None (Preliminary result)   Collection Time: 09/03/15  4:04 PM  Result Value Ref Range Status   Specimen Description TISSUE LEFT LEG  Final   Special Requests PART A DEEP TISSUE FROM LEFT LEG  Final   Fungal Smear   Final    NO YEAST OR FUNGAL ELEMENTS SEEN Performed at [facility]    Culture   Final    CULTURE IN PROGRESS FOR FOUR WEEKS Performed at [facility]    Report Status PENDING  Incomplete  AFB culture with smear     Status: None (Preliminary result)   Collection Time: 09/03/15  4:04 PM  Result Value Ref Range Status   Specimen Description TISSUE LEFT LEG  Final   Special Requests PART A DEEP TISSUE FROM LEFT LEG  Final   Acid Fast Smear   Final    NO ACID FAST BACILLI SEEN Performed at [facility]    Culture   Final    CULTURE WILL BE EXAMINED FOR 6 WEEKS BEFORE ISSUING A FINAL REPORT Performed at [facility]    Report Status PENDING  Incomplete  Tissue culture     Status: None (Preliminary result)   Collection Time: 09/03/15  4:04 PM  Result Value Ref Range Status   Specimen Description TISSUE  Final   Special Requests PART A DEEP TISSUE FROM LEFT LEG  Final   Gram Stain   Final    NO WBC SEEN NO SQUAMOUS EPITHELIAL CELLS SEEN NO ORGANISMS SEEN Performed at [facility]  Culture   Final    FEW STAPHYLOCOCCUS AUREUS Note: RIFAMPIN AND GENTAMICIN SHOULD NOT BE USED AS SINGLE DRUGS FOR TREATMENT OF STAPH INFECTIONS. Performed at [facility]    Report Status PENDING  Incomplete  Anaerobic culture     Status: None  (Preliminary result)   Collection Time: 09/03/15  4:14 PM  Result Value Ref Range Status   Specimen Description TISSUE LEFT LEG  Final   Special Requests PART B SUPERFICIAL TISSUE FROM LEFT LEG  Final   Gram Stain   Final    NO WBC SEEN NO SQUAMOUS EPITHELIAL CELLS SEEN NO ORGANISMS SEEN Performed at [facility]    Culture   Final    NO ANAEROBES ISOLATED; CULTURE IN PROGRESS FOR 5 DAYS Performed at [facility]    Report Status PENDING  Incomplete  Fungus Culture with Smear     Status: None (Preliminary result)   Collection Time: 09/03/15  4:14 PM  Result Value Ref Range Status   Specimen Description TISSUE LEFT LEG  Final   Special Requests PART B SUPERFICIAL TISSUE FROM LEFT LEG  Final   Fungal Smear   Final    NO YEAST OR FUNGAL ELEMENTS SEEN Performed at [facility]    Culture   Final    CULTURE IN PROGRESS FOR FOUR WEEKS Performed at [facility]    Report Status PENDING  Incomplete  AFB culture with smear     Status: None (Preliminary result)   Collection Time: 09/03/15  4:14 PM  Result Value Ref Range Status   Specimen Description TISSUE LEFT LEG  Final   Special Requests PART B SUPERFICIAL TISSUE FROM LEFT LEG  Final   Acid Fast Smear   Final    NO ACID FAST BACILLI SEEN Performed at [facility]    Culture   Final    CULTURE WILL BE EXAMINED FOR 6 WEEKS BEFORE ISSUING A FINAL REPORT Performed at [facility]    Report Status PENDING  Incomplete  Tissue culture     Status: None (Preliminary result)   Collection Time: 09/03/15  4:14 PM  Result Value Ref Range Status   Specimen Description TISSUE  Final   Special Requests PART B SUPERFICIAL TISSUE FROM LEFT LEG  Final   Gram Stain   Final    NO WBC SEEN NO SQUAMOUS EPITHELIAL CELLS SEEN NO ORGANISMS SEEN Performed at [facility]    Culture   Final    NO GROWTH 2 DAYS Performed at [facility]    Report Status PENDING  Incomplete      Labs: Basic Metabolic Panel:  Recent Labs Lab 09/02/15 1918 09/03/15 0537  NA 133* 135  K 3.8 3.7  CL 98* 104  CO2 29 24  GLUCOSE 305* 327*  BUN 19 18  CREATININE 1.04* 0.73  CALCIUM 8.6* 7.9*   Liver Function Tests: No results for input(s): AST, ALT, ALKPHOS, BILITOT, PROT, ALBUMIN in the last 168 hours. No results for input(s): LIPASE, AMYLASE in the last 168 hours. No results for input(s): AMMONIA in the last 168 hours. CBC:  Recent Labs Lab 09/02/15 1918 09/03/15 0537  WBC 8.9 9.0  HGB 11.6* 10.4*  HCT 34.6* 31.1*  MCV 87.4 88.6  PLT 339 293   Cardiac Enzymes: No results for input(s): CKTOTAL, CKMB, CKMBINDEX, TROPONINI in the last 168 hours. BNP: BNP (last 3 results)  Recent Labs  06/02/15 0415  BNP 194.6*  ProBNP (last 3 results) No results for input(s): PROBNP in the last 8760 hours.  CBG:  Recent Labs Lab 09/05/15 1130 09/05/15 1650 09/05/15 2107 09/06/15 0632 09/06/15 1138  GLUCAP 239* 88 213* 193* 250*       Signed:  VANN, JESSICA  [facility] 09/06/2015, 2:46 PM

## 2015-09-06 NOTE — Progress Notes (Signed)
Pt being referred to Interventional Radiology.

## 2015-09-06 NOTE — Progress Notes (Signed)
Inpatient Diabetes Program Recommendations  AACE/ADA: New Consensus Statement on Inpatient Glycemic Control (2015)  Target Ranges:  Prepandial:   less than 140 mg/dL      Peak postprandial:   less than 180 mg/dL (1-2 hours)      Critically ill patients:  140 - 180 mg/dL   Review of Glycemic Control Consider reducing Novolog meal coverage dose to 6 units. She has only received 1 dose of meal coverage since ordered? Thank you  Raoul Pitch BSN, RN,CDE Inpatient Diabetes Coordinator [phone] (team pager)

## 2015-09-06 NOTE — Progress Notes (Signed)
[facility]  Patient Status:  New pt to [facility] this admission though pt has had our services in the past.  [facility] is providing the following services: HHRN and Home Infusion Pharmacy. Pt will be eligible for only 2 weeks of charity care, [facility] and Home IV ABX, based on [facility] 30 day charity.  Pt has had 2 week of IV ABX in July 2016.  [facility] has scripts and is prepared for DC home on Saturday, 09-07-15 after PICC is placed.  Please notify [facility] of DC time to plan for RN visit.  If patient discharges after hours, please call [phone].   Larry Sierras 09/06/2015, 7:50 PM

## 2015-09-06 NOTE — Progress Notes (Signed)
Spoke with Dr. Eliseo Squires about the patient being discharged.  Patient was unable to get a PICC this afternoon by IR and the plan was for her to go home and come back on Monday to have a PICC placed as an outpatient in IR.  I called Dr. Eliseo Squires to ask if this was sufficient.  I then called IR to see if the patient was going to be able to have a PICC placed today and I was informed that she would not be getting the PICC done today.  I called to let Dr. Eliseo Squires know and provided her with IR phone number.  I was informed that she would need to talk with Dr. Kathlene Cote concerning placing the PICC on Saturday morning.  Arrangements were made and I informed Stanton Kidney, the case worker, and also talked with Pam, [facility] representative about the changes.

## 2015-09-06 NOTE — Care Management (Signed)
This Case Manager received phone call from Astrid Divine, RN with [facility]. She indicated patient being discharged home with home infusion services; however, IV team unable to get PICC in.  Astrid Divine, RN indicated medical team working on plan because IR unable to place PICC until Monday. Per Astrid Divine, one possible plan is that patient will discharge home on oral antibiotics and get PICC placed on Monday with IR. [facility] will then provide [facility] RN and home infusion pharmacy services for home IV antibiotics. She indicated in order for PICC to be placed in IR after discharge, patient's outpatient provider would need to place outpatient order for PICC. Spoke with Dr. Doreene Burke, Medical Director of [facility] and [facility], who placed order in case above plan implemented. Astrid Divine, RN CM updated and appreciative.

## 2015-09-06 NOTE — Progress Notes (Signed)
Assessed both arms for PICC insertion using ultrasound.   Veins are too small measuring 50% occupancy of veins.   Veins not well defined or visible.  Primary RN made aware.   I called Interventional Radiology and spoke with [facility].  She informed me that they have 2 more cases to do before they could possibly insert this PICC.  She is for discharge today.  They are not sure they can get to her today.

## 2015-09-06 NOTE — Progress Notes (Signed)
IR to place PICC lin in AM, patient can be d/c'd after with home health [facility]

## 2015-09-07 ENCOUNTER — Inpatient Hospital Stay (HOSPITAL_COMMUNITY): Payer: Medicaid Other

## 2015-09-07 DIAGNOSIS — T8740 Infection of amputation stump, unspecified extremity: Secondary | ICD-10-CM | POA: Insufficient documentation

## 2015-09-07 DIAGNOSIS — Z89512 Acquired absence of left leg below knee: Secondary | ICD-10-CM

## 2015-09-07 DIAGNOSIS — M869 Osteomyelitis, unspecified: Secondary | ICD-10-CM

## 2015-09-07 DIAGNOSIS — E1122 Type 2 diabetes mellitus with diabetic chronic kidney disease: Secondary | ICD-10-CM

## 2015-09-07 DIAGNOSIS — N181 Chronic kidney disease, stage 1: Secondary | ICD-10-CM

## 2015-09-07 DIAGNOSIS — A4901 Methicillin susceptible Staphylococcus aureus infection, unspecified site: Secondary | ICD-10-CM | POA: Insufficient documentation

## 2015-09-07 DIAGNOSIS — T8744 Infection of amputation stump, left lower extremity: Principal | ICD-10-CM

## 2015-09-07 LAB — TISSUE CULTURE
CULTURE: NO GROWTH
GRAM STAIN: NONE SEEN
Gram Stain: NONE SEEN

## 2015-09-07 LAB — GLUCOSE, CAPILLARY
GLUCOSE-CAPILLARY: 193 mg/dL — AB (ref 65–99)
Glucose-Capillary: 159 mg/dL — ABNORMAL HIGH (ref 65–99)

## 2015-09-07 MED ORDER — HEPARIN SOD (PORK) LOCK FLUSH 100 UNIT/ML IV SOLN
250.0000 [IU] | INTRAVENOUS | Status: AC | PRN
  Administered 2015-09-07: 250 [IU]

## 2015-09-07 MED ORDER — SODIUM CHLORIDE 0.9 % IJ SOLN
10.0000 mL | INTRAMUSCULAR | Status: DC | PRN
  Administered 2015-09-07: 20 mL

## 2015-09-07 MED ORDER — LIDOCAINE HCL 1 % IJ SOLN
INTRAMUSCULAR | Status: AC
  Filled 2015-09-07: qty 20

## 2015-09-07 NOTE — Procedures (Signed)
Interventional Radiology Procedure Note  Procedure:  PICC line  Complications:  None  Estimated Blood Loss: <10 mL  38 cm DL Power PICC via right brachial vein.  Tip at cavoatrial junction.  OK to use.  [name]. Kathlene Cote, M.D Pager:  [phone]

## 2015-09-08 LAB — CULTURE, BLOOD (ROUTINE X 2)
Culture: NO GROWTH
Culture: NO GROWTH

## 2015-09-09 ENCOUNTER — Telehealth: Payer: Self-pay

## 2015-09-09 LAB — ANAEROBIC CULTURE
GRAM STAIN: NONE SEEN
Gram Stain: NONE SEEN

## 2015-09-09 NOTE — Telephone Encounter (Addendum)
Transitional Care Clinic Post-discharge Follow-Up Phone Call:  Date of Discharge:09/07/15 Principal Discharge Diagnosis(es):  Infection LLE stump, DM Post-discharge Communication: spoke to the patient with the interpreter Call Completed: Yes                    With Whom: Patient Interpreter Needed: Yes               Language/Dialect: Alfredo Martinez # [phone] with [employer]. The patient stated that the power on her phone was low and the call was lost when discussing the medications.  The interpreter then called the patient back to complete the call. She reported no concerns about her care needs.      Please check all that apply:  X Patient is knowledgeable of his/her condition(s) and/or treatment. ? Patient is caring for self at home.  X Patient is receiving assist at home from family and/or caregiver. Family and/or caregiver is knowledgeable of patient's condition(s) and/or treatment. - She stated that her husband is administering the IV medication and the nurse from [place] [place] has been to her house.  X Patient is receiving home health services. If so, name of agency. - [facility] - infusion therapy.   Medication Reconciliation:  ? Medication list reviewed with patient. X Patient obtained all discharge medications. If not, why? - The patient stated that she has all of her medications and noted that the only new medication is the IV medication.  She said that she has her insulin and has been taking that but noted that she does not need any other medications.  Instructed her to bring all of the medications that she is taking, except the IV medications, to her appointment on 09/12/15 for the doctor to review with her and she stated that she would. It was difficult to assess over the phone exactly what oral medications she has at home.   She said that she has a glucometer at home and has been checking her blood sugars. She did not report any blood sugar results when asked.    She said that she had no questions about her medications and stated that "everything is fine."   Activities of Daily Living:  X Independent - she stated the she is able to care for herself and can move around independently and use the bathroom by herself. She has a w/c at home .  ? Needs assist  ? Total Care    Community resources in place for patient:  ? None  X Home Health/Home DME - [facility] for infusion therapy.  ? Assisted Living ? Support Group          Patient Education: Instructed regarding the importance of coming to her scheduled appointment at the [facility].          Questions/Concerns discussed:  She said that she had no questions or problems.  Her appointment for 09/12/15 @ 1115 was confirmed.  She said that she stransportation and noted that a " person" would be bringing her to the clinic but she would not answer who when she was asked.

## 2015-09-11 ENCOUNTER — Telehealth: Payer: Self-pay

## 2015-09-11 NOTE — Telephone Encounter (Signed)
This Case Manager placed call to patient to check on status and to remind her of her initial [facility] appointment on 09/12/15 at 1115. Patient had no complaints or problems and indicated she would be at appointment. She indicated she had transportation to upcoming appointment. Medication list reviewed with patient. Patient indicated she was checking blood glucose and administering insulin as prescribed. Patient also receiving home IV antibiotic. She indicated she does not take any additional home medications though there are other medications on med list. Dr. Jarold Song will need to address at patient's appointment on 09/12/15.  Discussed importance of keeping blood glucose log, and patient verbalized understanding. No additional needs identified at this time. [place] telephonic interpreting used [employer] [phone]).  In addition, this Case Manager received phone call from Carolynn Sayers, [place] Infusion Coordinator with [facility], who indicated patient has had 2 weeks of charity services with [facility] in the past, and [facility] will only be able to provide 2 additional weeks of charity care. Pam indicated [facility] to contact patient to inform of her option to private pay for remaining 4 weeks of home infusion therapy. She indicated options would be discussed with patient. If patient agreeable to private pay for 4 weeks of home infusion therapy, she inquired if [facility] would be able to provide PICC care so patient would not have to pay out of pocket for [facility] nursing services. Discussed with Environmental education officer who indicated she would have to determine if resources in place to meet this need. Carolynn Sayers updated. Information routed to Dr. Jarold Song as patient has upcoming appointment on 09/12/15.

## 2015-09-12 ENCOUNTER — Inpatient Hospital Stay: Payer: Self-pay | Admitting: Family Medicine

## 2015-09-13 ENCOUNTER — Telehealth: Payer: Self-pay

## 2015-09-13 NOTE — Telephone Encounter (Signed)
This Case Manager placed call to patient to check on status and to discuss rescheduling [facility] appointment as patient missed appointment on 09/12/15.  Patient denied any problems and concerns. Did not explain why she missed appointment.  Discussed importance of rescheduling Trans[facility] Care Clinic appointment and patient was agreeable. Appointment scheduled for 09/16/15 at 0915 with Dr. Jarold Song.  Patient indicated she will have transportation to her appointment. Patient would also benefit from scheduling an appointment with Financial Counselor as she indicated her [employer] had expired. Patient aware she will need to meet with Financial Counselor. No additional needs/concerns identified.  [employer] telephonic interpreting used for language translation (Interpretor [id number]-#[id number]).

## 2015-09-16 ENCOUNTER — Ambulatory Visit: Payer: Self-pay | Attending: Family Medicine | Admitting: Family Medicine

## 2015-09-16 ENCOUNTER — Encounter: Payer: Self-pay | Admitting: Family Medicine

## 2015-09-16 ENCOUNTER — Telehealth: Payer: Self-pay

## 2015-09-16 VITALS — BP 99/64 | HR 94 | Temp 98.2°F | Resp 16 | Ht 60.0 in | Wt 130.0 lb

## 2015-09-16 DIAGNOSIS — Z79899 Other long term (current) drug therapy: Secondary | ICD-10-CM | POA: Insufficient documentation

## 2015-09-16 DIAGNOSIS — Z794 Long term (current) use of insulin: Secondary | ICD-10-CM | POA: Insufficient documentation

## 2015-09-16 DIAGNOSIS — M79641 Pain in right hand: Secondary | ICD-10-CM | POA: Insufficient documentation

## 2015-09-16 DIAGNOSIS — E139 Other specified diabetes mellitus without complications: Secondary | ICD-10-CM

## 2015-09-16 DIAGNOSIS — E1165 Type 2 diabetes mellitus with hyperglycemia: Secondary | ICD-10-CM | POA: Insufficient documentation

## 2015-09-16 DIAGNOSIS — M869 Osteomyelitis, unspecified: Secondary | ICD-10-CM | POA: Insufficient documentation

## 2015-09-16 DIAGNOSIS — Z833 Family history of diabetes mellitus: Secondary | ICD-10-CM | POA: Insufficient documentation

## 2015-09-16 DIAGNOSIS — Z89512 Acquired absence of left leg below knee: Secondary | ICD-10-CM | POA: Insufficient documentation

## 2015-09-16 DIAGNOSIS — Z8544 Personal history of malignant neoplasm of other female genital organs: Secondary | ICD-10-CM | POA: Insufficient documentation

## 2015-09-16 DIAGNOSIS — E11628 Type 2 diabetes mellitus with other skin complications: Secondary | ICD-10-CM

## 2015-09-16 DIAGNOSIS — Z95828 Presence of other vascular implants and grafts: Secondary | ICD-10-CM

## 2015-09-16 LAB — CBC WITH DIFFERENTIAL/PLATELET
BASOS ABS: 0 10*3/uL (ref 0.0–0.1)
Basophils Relative: 0 % (ref 0–1)
EOS ABS: 0.4 10*3/uL (ref 0.0–0.7)
Eosinophils Relative: 4 % (ref 0–5)
HEMATOCRIT: 35.5 % — AB (ref 36.0–46.0)
Hemoglobin: 11.4 g/dL — ABNORMAL LOW (ref 12.0–15.0)
LYMPHS ABS: 2.2 10*3/uL (ref 0.7–4.0)
LYMPHS PCT: 24 % (ref 12–46)
MCH: 28.9 pg (ref 26.0–34.0)
MCHC: 32.1 g/dL (ref 30.0–36.0)
MCV: 89.9 fL (ref 78.0–100.0)
MPV: 8.2 fL — AB (ref 8.6–12.4)
Monocytes Absolute: 0.6 10*3/uL (ref 0.1–1.0)
Monocytes Relative: 7 % (ref 3–12)
NEUTROS PCT: 65 % (ref 43–77)
Neutro Abs: 6 10*3/uL (ref 1.7–7.7)
PLATELETS: 480 10*3/uL — AB (ref 150–400)
RBC: 3.95 MIL/uL (ref 3.87–5.11)
RDW: 13.3 % (ref 11.5–15.5)
WBC: 9.2 10*3/uL (ref 4.0–10.5)

## 2015-09-16 LAB — COMPREHENSIVE METABOLIC PANEL
ALBUMIN: 3.5 g/dL — AB (ref 3.6–5.1)
ALT: 5 U/L — ABNORMAL LOW (ref 6–29)
AST: 14 U/L (ref 10–35)
Alkaline Phosphatase: 115 U/L (ref 33–130)
BUN: 15 mg/dL (ref 7–25)
CALCIUM: 9 mg/dL (ref 8.6–10.4)
CHLORIDE: 101 mmol/L (ref 98–110)
CO2: 32 mmol/L — AB (ref 20–31)
Creat: 0.62 mg/dL (ref 0.50–1.05)
Glucose, Bld: 84 mg/dL (ref 65–99)
Potassium: 3.8 mmol/L (ref 3.5–5.3)
Sodium: 142 mmol/L (ref 135–146)
TOTAL PROTEIN: 7.1 g/dL (ref 6.1–8.1)
Total Bilirubin: 0.2 mg/dL (ref 0.2–1.2)

## 2015-09-16 LAB — POCT GLYCOSYLATED HEMOGLOBIN (HGB A1C): HEMOGLOBIN A1C: 10.1

## 2015-09-16 LAB — GLUCOSE, POCT (MANUAL RESULT ENTRY): POC Glucose: 154 mg/dl — AB (ref 70–99)

## 2015-09-16 MED ORDER — INSULIN GLARGINE 100 UNIT/ML SOLOSTAR PEN: 30.0000 [IU] | PEN_INJECTOR | Freq: Every day | SUBCUTANEOUS | Status: DC

## 2015-09-16 NOTE — Telephone Encounter (Addendum)
Confirmed with Carolynn Sayers, RN -infusion liaison with [facility] )  that their charity care for the ancef will end on Friday, 09/20/15 after her last dose; but she needs to continue to receive the medication until 10/15/15.  Harriet Butte noted that she spoke to the patient on Thursday, 1013/16 about establishing a payment plan for the medication ( ancef ) after 09/20/15.  She noted that the patient's husband was taught how to administer the medication; but the patient wanted to do it herself, so she was then taught how to self admin the medication using the SASH method with the medication administered IV push over 10 minutes.    Dr Jarold Song, Hattie Perch- [place] as well as Kerby Moors, [facility] Pharmacist were updated on the patient's continued  need for ancef starting 09/21/15 after her charity care with [facility] ends.  As per Elenore Rota, the patient can have her PICC line dressing changes and weekly blood work done at [facility].  The patient will need to meet with a financial counselor to determine eligibility for the [employer] and [employer]. She will also need to complete the financial assistance application for the lab services.   Barbette Reichmann has contacted [employer] Pharmacist, Vinnie Level, regarding options available for the patient to  receive her ancef after her charity care ends on 09/20/15.    Update provided to P. Tamera Punt, RN.

## 2015-09-16 NOTE — Patient Instructions (Signed)
Diabetes Mellitus and Food It is important for you to manage your blood sugar (glucose) level. Your blood glucose level can be greatly affected by what you eat. Eating healthier foods in the appropriate amounts throughout the day at about the same time each day will help you control your blood glucose level. It can also help slow or prevent worsening of your diabetes mellitus. Healthy eating may even help you improve the level of your blood pressure and reach or maintain a healthy weight.  General recommendations for healthful eating and cooking habits include:  Eating meals and snacks regularly. Avoid going long periods of time without eating to lose weight.  Eating a diet that consists mainly of plant-based foods, such as fruits, vegetables, nuts, legumes, and whole grains.  Using low-heat cooking methods, such as baking, instead of high-heat cooking methods, such as deep frying. Work with your dietitian to make sure you understand how to use the Nutrition Facts information on food labels. HOW CAN FOOD AFFECT ME? Carbohydrates Carbohydrates affect your blood glucose level more than any other type of food. Your dietitian will help you determine how many carbohydrates to eat at each meal and teach you how to count carbohydrates. Counting carbohydrates is important to keep your blood glucose at a healthy level, especially if you are using insulin or taking certain medicines for diabetes mellitus. Alcohol Alcohol can cause sudden decreases in blood glucose (hypoglycemia), especially if you use insulin or take certain medicines for diabetes mellitus. Hypoglycemia can be a life-threatening condition. Symptoms of hypoglycemia (sleepiness, dizziness, and disorientation) are similar to symptoms of having too much alcohol.  If your health care provider has given you approval to drink alcohol, do so in moderation and use the following guidelines:  Women should not have more than one drink per day, and men  should not have more than two drinks per day. One drink is equal to:  12 oz of beer.  5 oz of wine.  1 oz of hard liquor.  Do not drink on an empty stomach.  Keep yourself hydrated. Have water, diet soda, or unsweetened iced tea.  Regular soda, juice, and other mixers might contain a lot of carbohydrates and should be counted. WHAT FOODS ARE NOT RECOMMENDED? As you make food choices, it is important to remember that all foods are not the same. Some foods have fewer nutrients per serving than other foods, even though they might have the same number of calories or carbohydrates. It is difficult to get your body what it needs when you eat foods with fewer nutrients. Examples of foods that you should avoid that are high in calories and carbohydrates but low in nutrients include:  Trans fats (most processed foods list trans fats on the Nutrition Facts label).  Regular soda.  Juice.  Candy.  Sweets, such as cake, pie, doughnuts, and cookies.  Fried foods. WHAT FOODS CAN I EAT? Eat nutrient-rich foods, which will nourish your body and keep you healthy. The food you should eat also will depend on several factors, including:  The calories you need.  The medicines you take.  Your weight.  Your blood glucose level.  Your blood pressure level.  Your cholesterol level. You should eat a variety of foods, including:  Protein.  Lean cuts of meat.  Proteins low in saturated fats, such as fish, egg whites, and beans. Avoid processed meats.  Fruits and vegetables.  Fruits and vegetables that may help control blood glucose levels, such as apples, mangoes, and   yams.  Dairy products.  Choose fat-free or low-fat dairy products, such as milk, yogurt, and cheese.  Grains, bread, pasta, and rice.  Choose whole grain products, such as multigrain bread, whole oats, and brown rice. These foods may help control blood pressure.  Fats.  Foods containing healthful fats, such as nuts,  avocado, olive oil, canola oil, and fish. DOES EVERYONE WITH DIABETES MELLITUS HAVE THE SAME MEAL PLAN? Because every person with diabetes mellitus is different, there is not one meal plan that works for everyone. It is very important that you meet with a dietitian who will help you create a meal plan that is just right for you.   This information is not intended to replace advice given to you by your health care provider. Make sure you discuss any questions you have with your health care provider.   Document Released: 08/13/2005 Document Revised: 12/07/2014 Document Reviewed: 10/13/2013 Elsevier Interactive Patient Education 2016 Elsevier Inc.  

## 2015-09-16 NOTE — Progress Notes (Signed)
Pt's here for Osteomylitis f/up. Pt asking if we could get her IV out due to the pain. Pt reports feeling fatigue occasionally. Pt reports having allergic reaction to cefazolin.

## 2015-09-16 NOTE — Progress Notes (Signed)
[facility]  Date of telephone encounter: 09/09/15  Admit date: 09/02/15 Discharge date: 09/07/15  CC: Establish at the transitional care clinic  HPI: [name] is a 49 y.o. 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  On presentation at the ED WBC was 8.9. Irrigation and debridement of muscle, soft tissue, skin and revision amputation of left below knee amputation as well as tissue rearrangement was done on 09/03/15  wound culture grew MSSA, blood cultures was no growth to date 2..She was seen by infectious disease and a PICC line was placed with recommendations to commence IV Ancef for the next 6 weeks until 10/15/15. She was [facility] with [facility] and subsequently discharged.  Interval history: She complains of pain in entire right hand since the day of insertion of the PICC line; states the medication  "is not good for her organs".Currently receiving IV Ancef through [facility] which will run out on 09/20/15 and she still has 4 more weeks of treatment to go. She tells me "back in [place] they would not have to go through all this drama to get her cured" Denies fever, chills, development of rash or shortness of breath.  Allergies  Allergen Reactions  . Antibiotic Ear [Neomycin-Polymyxin-Hc]    Past Medical History  Diagnosis Date  . Diabetes mellitus   . 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 Janie Morning), Cysto performed by Dr. Jonna Munro 12/2009 with evac of clots and bx and fulguration. // H/O stage 2 SCC of the vulva  .  Anemia     2/2 blood loss  . Diabetes mellitus type 2, uncontrolled ([place]) DX: 2001  . Osteomyelitis ([place])     S/P removal 2nd MT head 01/29/11 - CX showing MSSA and GBS  . Diabetic foot ulcers ([place])   . Cataract   . Diabetes mellitus without complication [facility])    Current Outpatient Prescriptions on File Prior to Visit  Medication Sig Dispense Refill  . insulin aspart (NOVOLOG FLEXPEN) 100 UNIT/ML FlexPen Inject 10 Units into the skin 3 (three) times daily with meals. 15 mL 1  . Insulin Glargine (LANTUS SOLOSTAR) 100 UNIT/ML Solostar Pen Inject 25 Units into the skin daily at 10 pm. 15 mL 2  . Insulin Pen Needle 32G X 4 MM MISC 1 application by Does not apply route 4 (four) times daily -  with meals and at bedtime. 100 each 1  . iron polysaccharides (NIFEREX) 150 MG capsule Take 1 capsule (150 mg total) by mouth 2 times daily at 12 noon and 4 pm. 60 capsule 1  . Multiple Vitamin (MULTIVITAMIN WITH MINERALS) TABS tablet Take 1 tablet by mouth daily. 30 tablet 1  . acetaminophen (TYLENOL) 325 MG tablet Take 2 tablets (650 mg total) by mouth every 6 (six) hours as needed for mild pain or moderate pain. (Patient not taking: Reported on 09/16/2015) 100 tablet 0  . ceFAZolin (ANCEF) 2-3 GM-% SOLR Inject 50 mLs (2 g total) into the vein every 8 (eight) hours. (Patient not taking: Reported on 09/16/2015) 75 each 0  . glucose blood (TRUE METRIX  BLOOD GLUCOSE TEST) test strip USE TO TEST BLOOD SUGAR 3 TIMES DAILY (Patient not taking: Reported on 09/16/2015) 100 each 12  . polycarbophil (FIBERCON) 625 MG tablet Take 1 tablet (625 mg total) by mouth 2 (two) times daily. (Patient not taking: Reported on 09/16/2015) 60 tablet 1  . simethicone (MYLICON) 80 MG chewable tablet Chew 1 tablet (80 mg total) by mouth 4 (four) times daily as needed for flatulence (for gas and bloating). (Patient not taking: Reported on 09/16/2015) 30 tablet 0  . traMADol (ULTRAM) 50 MG tablet Take 0.5-1 tablets (25-50 mg total) by mouth  every 6 (six) hours as needed for severe pain. (Patient not taking: Reported on 09/16/2015) 50 tablet 0  . TRUEPLUS LANCETS 28G MISC USE TO TEST BLOOD SUGAR 3 TIMES A DAY (Patient not taking: Reported on 09/16/2015) 100 each 12   No current facility-administered medications on file prior to visit.   Family History  Problem Relation Age of Onset  . Diabetes Mother   . Diabetes Father   . Diabetes Brother    Social History   Social History  . Marital Status: Married    Spouse Name: N/A  . Number of Children: N/A  . Years of Education: N/A   Occupational History  . Not on file.   Social History Main Topics  . Smoking status: Never Smoker   . Smokeless tobacco: Never Used  . Alcohol Use: No  . Drug Use: No  . Sexual Activity: Yes   Other Topics Concern  . Not on file   Social History Narrative   ** Merged History Encounter **        Review of Systems: Constitutional: Negative for fever, chills, diaphoresis, activity change, appetite change and fatigue. HENT: Negative for ear pain, nosebleeds, congestion, facial swelling, rhinorrhea, neck pain, neck stiffness and ear discharge.  Eyes: Negative for pain, discharge, redness, itching and visual disturbance. Respiratory: Negative for cough, choking, chest tightness, shortness of breath, wheezing and stridor.  Cardiovascular: Negative for chest pain, palpitations and leg swelling. Gastrointestinal: Negative for abdominal distention. Genitourinary: Negative for dysuria, urgency, frequency, hematuria, flank pain, decreased urine volume, difficulty urinating and dyspareunia.  Musculoskeletal: See history of present illness Neurological: Negative for dizziness, tremors, seizures, syncope, facial asymmetry, speech difficulty, weakness, light-headedness, numbness and headaches.  Hematological: Negative for adenopathy. Does not bruise/bleed easily. Psychiatric/Behavioral: Negative for hallucinations, behavioral problems, confusion,  dysphoric mood, decreased concentration and agitation.    Objective:   Filed Vitals:   09/16/15 0946  BP: 99/64  Pulse: 94  Temp: 98.2 F (36.8 C)  Resp: 16    Physical Exam: Constitutional: Patient appears well-developed and well-nourished. No distress. HENT: Normocephalic, atraumatic, External right and left ear normal. Oropharynx is clear and moist.  Eyes: Conjunctivae and EOM are normal. PERRLA, no scleral icterus. Neck: Normal ROM. Neck supple. No JVD. No tracheal deviation. No thyromegaly. CVS: RRR, S1/S2 +, no murmurs, no gallops, no carotid bruit.  Pulmonary: Effort and breath sounds normal, no stridor, rhonchi, wheezes, rales.  Abdominal: Soft. BS +,  no distension, tenderness, rebound or guarding.  Musculoskeletal: left BKA, staples in place and no incentive infection noted right brachiocephalic PICC   Lymphadenopathy: No lymphadenopathy noted, cervical, inguinal or axillary Neuro: Alert. Normal reflexes, muscle tone coordination. No cranial nerve deficit. Skin: Skin is warm and dry; left knee abrasion.  Psychiatric: Normal mood and affect. Behavior, judgment, thought content normal.  Lab Results  Component Value Date   WBC 9.0 09/03/2015   HGB  10.4* 09/03/2015   HCT 31.1* 09/03/2015   MCV 88.6 09/03/2015   PLT 293 09/03/2015   Lab Results  Component Value Date   CREATININE 0.73 09/03/2015   BUN 18 09/03/2015   NA 135 09/03/2015   K 3.7 09/03/2015   CL 104 09/03/2015   CO2 24 09/03/2015    Lab Results  Component Value Date   HGBA1C 11.7* 06/01/2015   Lipid Panel     Component Value Date/Time   CHOL 209* 12/20/2014 1132   TRIG 184* 12/20/2014 1132   HDL 57 12/20/2014 1132   CHOLHDL 3.7 12/20/2014 1132   VLDL 37 12/20/2014 1132   LDLCALC 115* 12/20/2014 1132       Assessment and plan:   Left stump osteomyelitis: Currently on IV Ancef and should remain on it till 10/15/15 I have spoken with the pharmacy as well as office manager to try and  arrange obtaining her IV antibiotics from the pharmacy here or the hospital pharmacy given charity care with [facility] will be ending this Friday-09/20/15 CBC ordered today. Appointment with Ortho on 09/19/15  Diabetes Mellitus: Uncontrolled with A1c of 10.10, CBG of 154 Increased Lantus to 30 units   PICC line in place: I have inspected the site of her PICC line and that does not seem to be any evidence of infection or tenderness. She has been reassured as it is possible that the symptoms she describes is what she feels during administration of IV antibiotics.  Arnoldo Morale, [facility] [phone] 09/16/2015, 9:54 AM

## 2015-09-17 ENCOUNTER — Telehealth: Payer: Self-pay

## 2015-09-17 LAB — MICROALBUMIN / CREATININE URINE RATIO
Creatinine, Urine: 71 mg/dL (ref 20–320)
MICROALB UR: 3.6 mg/dL
Microalb Creat Ratio: 51 mcg/mg creat — ABNORMAL HIGH (ref <30)

## 2015-09-17 NOTE — Telephone Encounter (Signed)
-----   Message from Arnoldo Morale, MD sent at 09/17/2015  8:36 AM EDT ----- Blood count is stable; she has some microalbuminuria which is due to the effect of DM on her kidneys.

## 2015-09-17 NOTE — Telephone Encounter (Signed)
Spoke to Kerby Moors, [facility],The Pharmacist who stated that [facility]) will be providing the patient with the ancef for the remainder of her treatment through their charity care program.    Confirmed with Carolynn Sayers, RN [facility] Infusion Liaison, that [facility] will provide the patient with her ancef and supplies for administering the medication as well as PICC line dressing supplies through their charity care program for the remainder of her course of ancef. . The patient will have the PICC line dressing changes and labwork done weekly at [facility].  She noted that the patient was having a CBC w/ diff and BMP drawn weekly on Mondays.    This CM then updated Dr Jarold Song regarding the plan for the ancef, PICC dressing changes and labwork. Dr Jarold Song requested that the patient be placed on her schedule and an appointment was scheduled for 09/23/15 @ 1100.  Call was then placed to the patient using [place] Special educational needs teacher # [phone]) who attempted to contact the patient 3 times but the calls were dropped each time  The patient noted that her phone battery was low and she was agreeable to having this CM call her back tomorrow.  CM to inform the patient of the plan for PICC dressing changes and lab work and notify her of her upcoming appointment at [facility].

## 2015-09-18 ENCOUNTER — Telehealth: Payer: Self-pay

## 2015-09-18 NOTE — Telephone Encounter (Signed)
Attempted to contact  the patient to inform her of the plan for [facility] to continue to provide her antibiotic through their charity care program for the remainder of the course of ancef. Also to inform her of her upcoming appointment at [facility] on 09/23/15 @ 1100 for blood work and a PICC line dressing change.   Call placed to # [phone] (H) with assistance from [place] # [phone] from [employer]. A voice mail message was left notifying the patient that the  case manager would try to contact her again tomorrow.

## 2015-09-19 ENCOUNTER — Telehealth: Payer: Self-pay

## 2015-09-19 NOTE — Telephone Encounter (Signed)
CMA called pt, pt verified name and DOB via interpreter. Pt was given lab results and I encouraged pt to call back if she was unclear as to the information given. Pt verbalized she understood.  [place] # [phone]

## 2015-09-19 NOTE — Telephone Encounter (Signed)
-----   Message from Arnoldo Morale, MD sent at 09/17/2015  8:36 AM EDT ----- Blood count is stable; she has some microalbuminuria which is due to the effect of DM on her kidneys.

## 2015-09-19 NOTE — Telephone Encounter (Signed)
This Case Manager placed call to patient to inform her that  [facility] will continue to provide antibiotic through their charity care program for the remainder of course-until 10/15/15. Wanted to inform patient that [facility] able to provide PICC line dressing changes and do needed labwork. Patient has a scheduled appointment on 09/23/15 at 1100 with Dr. Jarold Song.  Call placed to [phone] with assistance from [place] [facility] # [phone]) with [facility] Telephonic Interpreting.  Unable to reach patient; voicemail left. Will attempt to call patient back at a later time.

## 2015-09-20 ENCOUNTER — Telehealth: Payer: Self-pay

## 2015-09-20 NOTE — Telephone Encounter (Signed)
Addendum-Once again placed call to patient to inform her to bring entire box of medical supplies to her appointment. Patient verbalized understanding. Patient had no additional questions or concerns.  Call placed with assistance from [facility] (Interpreter # [phone]) with [place].

## 2015-09-20 NOTE — Telephone Encounter (Signed)
This Case Manager placed call to patient to inform her that [facility] will continue to provide IV ancef through their charity care program for the remainder of the antibiotic course-until 10/15/15. Informed patient that she will need to come to [facility] weekly for PICC line dressing changes and for labwork.  Patient informed she will need to bring PICC line dressing kit to her appointment. Dressing kits would be provided by [place]. Patient indicated she was uncertain if she had these kits, and she was uncertain what to bring to her appointment. She indicated she had a box that had medical supplies in it. Patient informed that this Case Manager would call [place] to ensure PICC line dressing kits had been delivered to her home. Patient verbalized understanding.   Patient also informed her appointment for dressing change and labwork would be 09/23/15 at 1100 with Dr. Jarold Song. Patient aware of her appointment. Patient informed she will need to schedule an appointment to meet with a Development worker, community at [facility] to determine if she qualifies for the [employer] or [employer]. Also informed patient she will need to also complete a lab financial assistance application. Patient verbalized understanding and was able to teach back above information.  Call completed with assistance from [place] Evelina Bucy [phone]) with [employer].   Addendum-Spoke with Debbie with the Pharmacy at [facility] who confirmed they were supplying patient with PICC line dressing kits. Debbie suggested patient bring the entire box of medical supplies to her appointment. Additional call placed to patient to provide update; however, unable to reach patient.  Call placed with assistance from [facility] [phone]). Will attempt to reach patient again at a later time.

## 2015-09-23 ENCOUNTER — Other Ambulatory Visit: Payer: Self-pay

## 2015-09-23 ENCOUNTER — Encounter: Payer: Self-pay | Admitting: Family Medicine

## 2015-09-23 ENCOUNTER — Ambulatory Visit: Payer: Self-pay | Attending: Family Medicine | Admitting: Family Medicine

## 2015-09-23 VITALS — BP 109/68 | HR 98 | Temp 98.3°F | Resp 18

## 2015-09-23 DIAGNOSIS — Z89512 Acquired absence of left leg below knee: Secondary | ICD-10-CM

## 2015-09-23 DIAGNOSIS — T8744 Infection of amputation stump, left lower extremity: Secondary | ICD-10-CM | POA: Insufficient documentation

## 2015-09-23 DIAGNOSIS — Y835 Amputation of limb(s) as the cause of abnormal reaction of the patient, or of later complication, without mention of misadventure at the time of the procedure: Secondary | ICD-10-CM | POA: Insufficient documentation

## 2015-09-23 DIAGNOSIS — M869 Osteomyelitis, unspecified: Secondary | ICD-10-CM | POA: Insufficient documentation

## 2015-09-23 DIAGNOSIS — E1065 Type 1 diabetes mellitus with hyperglycemia: Secondary | ICD-10-CM | POA: Insufficient documentation

## 2015-09-23 DIAGNOSIS — Z95828 Presence of other vascular implants and grafts: Secondary | ICD-10-CM

## 2015-09-23 DIAGNOSIS — E11628 Type 2 diabetes mellitus with other skin complications: Secondary | ICD-10-CM

## 2015-09-23 DIAGNOSIS — Z8544 Personal history of malignant neoplasm of other female genital organs: Secondary | ICD-10-CM | POA: Insufficient documentation

## 2015-09-23 DIAGNOSIS — A4901 Methicillin susceptible Staphylococcus aureus infection, unspecified site: Secondary | ICD-10-CM

## 2015-09-23 LAB — CBC WITH DIFFERENTIAL/PLATELET
BASOS ABS: 0.1 10*3/uL (ref 0.0–0.1)
Basophils Relative: 1 % (ref 0–1)
Eosinophils Absolute: 0.4 10*3/uL (ref 0.0–0.7)
Eosinophils Relative: 7 % — ABNORMAL HIGH (ref 0–5)
HEMATOCRIT: 36.6 % (ref 36.0–46.0)
HEMOGLOBIN: 12 g/dL (ref 12.0–15.0)
LYMPHS PCT: 33 % (ref 12–46)
Lymphs Abs: 2.1 10*3/uL (ref 0.7–4.0)
MCH: 28.6 pg (ref 26.0–34.0)
MCHC: 32.8 g/dL (ref 30.0–36.0)
MCV: 87.4 fL (ref 78.0–100.0)
MPV: 8.6 fL (ref 8.6–12.4)
Monocytes Absolute: 0.4 10*3/uL (ref 0.1–1.0)
Monocytes Relative: 6 % (ref 3–12)
NEUTROS ABS: 3.3 10*3/uL (ref 1.7–7.7)
Neutrophils Relative %: 53 % (ref 43–77)
Platelets: 500 10*3/uL — ABNORMAL HIGH (ref 150–400)
RBC: 4.19 MIL/uL (ref 3.87–5.11)
RDW: 13.8 % (ref 11.5–15.5)
WBC: 6.3 10*3/uL (ref 4.0–10.5)

## 2015-09-23 LAB — COMPREHENSIVE METABOLIC PANEL
ALBUMIN: 3.6 g/dL (ref 3.6–5.1)
ALT: 9 U/L (ref 6–29)
AST: 14 U/L (ref 10–35)
Alkaline Phosphatase: 141 U/L — ABNORMAL HIGH (ref 33–130)
BUN: 18 mg/dL (ref 7–25)
CHLORIDE: 101 mmol/L (ref 98–110)
CO2: 30 mmol/L (ref 20–31)
Calcium: 9.6 mg/dL (ref 8.6–10.4)
Creat: 0.59 mg/dL (ref 0.50–1.05)
Glucose, Bld: 184 mg/dL — ABNORMAL HIGH (ref 65–99)
POTASSIUM: 4.9 mmol/L (ref 3.5–5.3)
Sodium: 141 mmol/L (ref 135–146)
TOTAL PROTEIN: 7.1 g/dL (ref 6.1–8.1)
Total Bilirubin: 0.3 mg/dL (ref 0.2–1.2)

## 2015-09-23 LAB — GLUCOSE, POCT (MANUAL RESULT ENTRY): POC GLUCOSE: 206 mg/dL — AB (ref 70–99)

## 2015-09-23 NOTE — Progress Notes (Signed)
[place]  Date of telephone encounter: 09/09/15  Admit date: 09/02/15 Discharge date: 09/07/15  PCP-none   Subjective:    Patient ID: [name], female    DOB: [birth year], 49 y.o.   MRN: [id number]  HPI [name] is a 49 y.o. 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  On presentation at the ED WBC was 8.9. Irrigation and debridement of muscle, soft tissue, skin and revision amputation of left below knee amputation as well as tissue rearrangement was done on 09/03/15 wound culture grew MSSA, blood cultures was no growth to date 2..She was seen by infectious disease and a PICC line was placed with recommendations to commence IV Ancef for the next 6 weeks until 10/15/15. She was [facility] with [facility] and subsequently discharged.  Interval history: We had spoken with [facility] after the last visit and they had kindly agreed to prolong their charity care and will work with the patient on till completion of antibiotics on 10/15/15. She has been administering the IV Ancef every 8 hours by herself with the assistance of her husband and comes in today for dressing change. Her Lantus was increased to 30 units at her last office visit and she reports her random blood sugars range between 102 and 100. She has no additional concerns today.  Past Medical History  Diagnosis Date  . Diabetes mellitus   . 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 Janie Morning), Cysto performed by Dr. Jonna Munro 12/2009 with evac of clots and bx and fulguration. // H/O  stage 2 SCC of the vulva  . Anemia     2/2 blood loss  . Diabetes mellitus type 2, uncontrolled ([place]) DX: 2001  . Osteomyelitis ([place])     S/P removal 2nd MT head 01/29/11 - CX showing MSSA and GBS  . Diabetic foot ulcers ([place])   . Cataract   . Diabetes mellitus without complication [facility])     Past Surgical History  Procedure Laterality Date  . Radical wide local excision of the vulva and right nguinal lymph node dissection  10/2003    Dr. Fermin Schwab  . Uterine dilatation and currettage    . Labial mass excision  07/2003    Dr. Ree Edman  . Left second toe mtp joint amputation  12/19/2010    Dr. Mayer Camel  . Irrigation and debridement of left foot with removal of left  01/29/2011    Dr. Mayer Camel  . Tubal ligation    . I&d extremity Left 06/02/2015    Procedure: IRRIGATION AND DEBRIDEMENT EXTREMITY;  Surgeon: Leandrew Koyanagi, MD;  Location: [facility];  Service: Orthopedics;  Laterality: Left;  . Amputation Left 06/02/2015    Procedure: Partial Left  AMPUTATION FOOT;  Surgeon: Leandrew Koyanagi, MD;  Location[facility];  Service: Orthopedics;  Laterality: Left;  . I&d extremity Left 06/05/2015    Procedure: IRRIGATION AND DEBRIDEMENT LEFT LEG, POSSIBLE CLOSURE BELOW KNEE AMPUTATION;  Surgeon: Leandrew Koyanagi, MD;  Location: [place];  Service: Orthopedics;  Laterality: Left;  . Amputation Left 06/03/2015    Procedure: Left Transtibial amputation,application of wound vac;  Surgeon: Leandrew Koyanagi, MD;  [facility];  Service: Orthopedics;  Laterality: Left;  . I&d extremity Left 06/08/2015    Procedure: IRRIGATION AND DEBRIDEMENT LEFT LEG, WITH  CLOSURE OF BKA;  Surgeon: Leandrew Koyanagi, MD;  Location: [place];  Service: Orthopedics;  Laterality: Left;  . Tee without cardioversion N/A 06/11/2015    Procedure: TRANSESOPHAGEAL ECHOCARDIOGRAM (TEE);  Surgeon: Lelon Perla, MD;  Location: [facility] END[facility];  Service: Cardiovascular;  Laterality: N/A;  . Amputation Left 06/26/2015    Procedure: AMPUTATION BELOW KNEE STUMP RECLOSURE ;   Surgeon: Marybelle Killings, MD;  Location: [place];  Service: Orthopedics;  Laterality: Left;  . Stump revision Left 09/03/2015    Procedure: LEFT LEG REVISION AMPUTATION;  Surgeon: Leandrew Koyanagi, MD;  Location: [place];  Service: Orthopedics;  Laterality: Left;  . I&d extremity Left 09/03/2015    Procedure: IRRIGATION AND DEBRIDEMENT LEFT BELOW KNEE AMPUTATION;  Surgeon: Leandrew Koyanagi, MD;  Location: [place];  Service: Orthopedics;  Laterality: Left;   Social History   Social History  . Marital Status: Married    Spouse Name: N/A  . Number of Children: N/A  . Years of Education: N/A   Occupational History  . Not on file.   Social History Main Topics  . Smoking status: Never Smoker   . Smokeless tobacco: Never Used  . Alcohol Use: No  . Drug Use: No  . Sexual Activity: Yes   Other Topics Concern  . Not on file   Social History Narrative   ** Merged History Encounter **        Allergies  Allergen Reactions  . Cefazolin Rash    Current Outpatient Prescriptions on File Prior to Visit  Medication Sig Dispense Refill  . glucose blood (TRUE METRIX BLOOD GLUCOSE TEST) test strip USE TO TEST BLOOD SUGAR 3 TIMES DAILY 100 each 12  . insulin aspart (NOVOLOG FLEXPEN) 100 UNIT/ML FlexPen Inject 10 Units into the skin 3 (three) times daily with meals. 15 mL 1  . Insulin Glargine (LANTUS SOLOSTAR) 100 UNIT/ML Solostar Pen Inject 30 Units into the skin daily at 10 pm. 15 mL 2  . Insulin Pen Needle 32G X 4 MM MISC 1 application by Does not apply route 4 (four) times daily -  with meals and at bedtime. 100 each 1  . Multiple Vitamin (MULTIVITAMIN WITH MINERALS) TABS tablet Take 1 tablet by mouth daily. 30 tablet 1  . polycarbophil (FIBERCON) 625 MG tablet Take 1 tablet (625 mg total) by mouth 2 (two) times daily. 60 tablet 1  . TRUEPLUS LANCETS 28G MISC USE TO TEST BLOOD SUGAR 3 TIMES A DAY 100 each 12  . acetaminophen (TYLENOL) 325 MG tablet Take 2 tablets (650 mg total) by mouth every 6 (six) hours  as needed for mild pain or moderate pain. (Patient not taking: Reported on 09/16/2015) 100 tablet 0  . ceFAZolin (ANCEF) 2-3 GM-% SOLR Inject 50 mLs (2 g total) into the vein every 8 (eight) hours. (Patient not taking: Reported on 09/16/2015) 75 each 0  . iron polysaccharides (NIFEREX) 150 MG capsule Take 1 capsule (150 mg total) by mouth 2 times daily at 12 noon and 4 pm. (Patient not taking: Reported on 09/23/2015) 60 capsule 1  . simethicone (MYLICON) 80 MG chewable tablet Chew 1 tablet (80 mg total) by mouth 4 (four) times daily as needed for flatulence (for gas and bloating). (Patient not taking: Reported on 09/16/2015) 30 tablet 0  .  traMADol (ULTRAM) 50 MG tablet Take 0.5-1 tablets (25-50 mg total) by mouth every 6 (six) hours as needed for severe pain. (Patient not taking: Reported on 09/16/2015) 50 tablet 0   No current facility-administered medications on file prior to visit.      Review of Systems Constitutional: Negative for fever, chills, diaphoresis, activity change, appetite change and fatigue. HENT: Negative for ear pain, nosebleeds, congestion, facial swelling, rhinorrhea, neck pain, neck stiffness and ear discharge.  Eyes: Negative for pain, discharge, redness, itching and visual disturbance. Respiratory: Negative for cough, choking, chest tightness, shortness of breath, wheezing and stridor.  Cardiovascular: Negative for chest pain, palpitations and leg swelling. Gastrointestinal: Negative for abdominal distention. Genitourinary: Negative for dysuria, urgency, frequency, hematuria, flank pain, decreased urine volume, difficulty urinating and dyspareunia.  Musculoskeletal: See history of present illness Neurological: Negative for dizziness, tremors, seizures, syncope, facial asymmetry, speech difficulty, weakness, light-headedness, numbness and headaches.  Hematological: Negative for adenopathy. Does not bruise/bleed easily. Psychiatric/Behavioral: Negative for hallucinations,  behavioral problems, confusion, dysphoric mood, decreased concentration and agitation.      Objective: Filed Vitals:   09/23/15 1104  BP: 109/68  Pulse: 98  Temp: 98.3 F (36.8 C)  TempSrc: Oral  Resp: 18  SpO2: 98%      Physical Exam Constitutional: Patient appears well-developed and well-nourished. No distress. HENT: Normocephalic, atraumatic, External right and left ear normal. Oropharynx is clear and moist.  Eyes: Conjunctivae and EOM are normal. PERRLA, no scleral icterus. Neck: Normal ROM. Neck supple. No JVD. No tracheal deviation. No thyromegaly. CVS: RRR, S1/S2 +, no murmurs, no gallops, no carotid bruit.  Pulmonary: Effort and breath sounds normal, no stridor, rhonchi, wheezes, rales.  Abdominal: Soft. BS +,  no distension, tenderness, rebound or guarding.  Musculoskeletal: left BKA, staples in place and no evidence of infection noted right brachiocephalic PICC   Lymphadenopathy: No lymphadenopathy noted, cervical, inguinal or axillary Neuro: Alert. Normal reflexes, muscle tone coordination. No cranial nerve deficit. Skin: Skin is warm and dry; left knee abrasion.  Psychiatric: Normal mood and affect. Behavior, judgment, thought content normal.        Assessment & Plan:  Left stump osteomyelitis: Currently on IV Ancef and should remain on it till 10/15/15 The case manager had spoken with [facility] was had agreed to continue this patient's care till completion of antibiotic on 10/15/15 Recently had appointment with Ortho CBC, CMET today.  Diabetes Mellitus: Uncontrolled with A1c of 10.10, CBG of 206 Blood sugars have improved based on reported home blood sugars. I will make no changes to her regimen at this time.  PICC line in place: I have inspected the site of her PICC line and that does not seem to be any evidence of infection or tenderness. PICC line dressing change performed in the clinic today and she will return in one week for repeat dressing  change as well as a CBC and CMP.  This note has been created with Surveyor, quantity. Any transcriptional errors are unintentional.

## 2015-09-23 NOTE — Patient Instructions (Signed)
Diabetes Mellitus and Food It is important for you to manage your blood sugar (glucose) level. Your blood glucose level can be greatly affected by what you eat. Eating healthier foods in the appropriate amounts throughout the day at about the same time each day will help you control your blood glucose level. It can also help slow or prevent worsening of your diabetes mellitus. Healthy eating may even help you improve the level of your blood pressure and reach or maintain a healthy weight.  General recommendations for healthful eating and cooking habits include:  Eating meals and snacks regularly. Avoid going long periods of time without eating to lose weight.  Eating a diet that consists mainly of plant-based foods, such as fruits, vegetables, nuts, legumes, and whole grains.  Using low-heat cooking methods, such as baking, instead of high-heat cooking methods, such as deep frying. Work with your dietitian to make sure you understand how to use the Nutrition Facts information on food labels. HOW CAN FOOD AFFECT ME? Carbohydrates Carbohydrates affect your blood glucose level more than any other type of food. Your dietitian will help you determine how many carbohydrates to eat at each meal and teach you how to count carbohydrates. Counting carbohydrates is important to keep your blood glucose at a healthy level, especially if you are using insulin or taking certain medicines for diabetes mellitus. Alcohol Alcohol can cause sudden decreases in blood glucose (hypoglycemia), especially if you use insulin or take certain medicines for diabetes mellitus. Hypoglycemia can be a life-threatening condition. Symptoms of hypoglycemia (sleepiness, dizziness, and disorientation) are similar to symptoms of having too much alcohol.  If your health care provider has given you approval to drink alcohol, do so in moderation and use the following guidelines:  Women should not have more than one drink per day, and men  should not have more than two drinks per day. One drink is equal to:  12 oz of beer.  5 oz of wine.  1 oz of hard liquor.  Do not drink on an empty stomach.  Keep yourself hydrated. Have water, diet soda, or unsweetened iced tea.  Regular soda, juice, and other mixers might contain a lot of carbohydrates and should be counted. WHAT FOODS ARE NOT RECOMMENDED? As you make food choices, it is important to remember that all foods are not the same. Some foods have fewer nutrients per serving than other foods, even though they might have the same number of calories or carbohydrates. It is difficult to get your body what it needs when you eat foods with fewer nutrients. Examples of foods that you should avoid that are high in calories and carbohydrates but low in nutrients include:  Trans fats (most processed foods list trans fats on the Nutrition Facts label).  Regular soda.  Juice.  Candy.  Sweets, such as cake, pie, doughnuts, and cookies.  Fried foods. WHAT FOODS CAN I EAT? Eat nutrient-rich foods, which will nourish your body and keep you healthy. The food you should eat also will depend on several factors, including:  The calories you need.  The medicines you take.  Your weight.  Your blood glucose level.  Your blood pressure level.  Your cholesterol level. You should eat a variety of foods, including:  Protein.  Lean cuts of meat.  Proteins low in saturated fats, such as fish, egg whites, and beans. Avoid processed meats.  Fruits and vegetables.  Fruits and vegetables that may help control blood glucose levels, such as apples, mangoes, and   yams.  Dairy products.  Choose fat-free or low-fat dairy products, such as milk, yogurt, and cheese.  Grains, bread, pasta, and rice.  Choose whole grain products, such as multigrain bread, whole oats, and brown rice. These foods may help control blood pressure.  Fats.  Foods containing healthful fats, such as nuts,  avocado, olive oil, canola oil, and fish. DOES EVERYONE WITH DIABETES MELLITUS HAVE THE SAME MEAL PLAN? Because every person with diabetes mellitus is different, there is not one meal plan that works for everyone. It is very important that you meet with a dietitian who will help you create a meal plan that is just right for you.   This information is not intended to replace advice given to you by your health care provider. Make sure you discuss any questions you have with your health care provider.   Document Released: 08/13/2005 Document Revised: 12/07/2014 Document Reviewed: 10/13/2013 Elsevier Interactive Patient Education 2016 Elsevier Inc.  

## 2015-09-23 NOTE — Progress Notes (Signed)
Pt' here for Osteomyelitis, TCC. Pt denies pain today. Pt reports taken meds today.

## 2015-09-29 LAB — FUNGUS CULTURE W SMEAR
FUNGAL SMEAR: NONE SEEN
FUNGAL SMEAR: NONE SEEN

## 2015-09-30 ENCOUNTER — Telehealth: Payer: Self-pay

## 2015-09-30 NOTE — Telephone Encounter (Signed)
Call placed to the patient to check on her status and to remind her of her appointment tomorrow, 10/01/15 @ 0930. Quillian Quince - Spanish interpreter # [phone] with [facility] [employer] assisted with the call. The patient confirmed that she will be at her appointment tomorrow and noted that she has transportation to the clinic. The clinic address was also confirmed  She said that she is " doing fine."  No problems/concerns reported and she said that she did not have any questions. Reminded her that she will be having the PICC line dressing changed at her appointment and she needs to bring her IV supplies to the appointment.  She verbalized understanding and stated that she would bring the supplies with her.

## 2015-09-30 NOTE — Telephone Encounter (Signed)
Attempted to contact the patient to check on her status and to remind her of her appointment at [facility] tomorrow, 10/01/15 @ 0930.  Call placed to # [phone] (H) the assistance of [place] Interpreter # [phone] with [employer].  A voice mail message was left requesting a call back to # [phone].

## 2015-10-01 ENCOUNTER — Encounter: Payer: Self-pay | Admitting: Family Medicine

## 2015-10-01 ENCOUNTER — Ambulatory Visit: Payer: Managed Care, Other (non HMO) | Attending: Family Medicine | Admitting: Family Medicine

## 2015-10-01 VITALS — BP 107/67 | HR 91 | Temp 98.1°F | Resp 16

## 2015-10-01 DIAGNOSIS — Z95828 Presence of other vascular implants and grafts: Secondary | ICD-10-CM | POA: Insufficient documentation

## 2015-10-01 DIAGNOSIS — M869 Osteomyelitis, unspecified: Secondary | ICD-10-CM

## 2015-10-01 DIAGNOSIS — A4901 Methicillin susceptible Staphylococcus aureus infection, unspecified site: Secondary | ICD-10-CM | POA: Insufficient documentation

## 2015-10-01 DIAGNOSIS — E11628 Type 2 diabetes mellitus with other skin complications: Secondary | ICD-10-CM | POA: Insufficient documentation

## 2015-10-01 LAB — CBC WITH DIFFERENTIAL/PLATELET
BASOS ABS: 0.1 10*3/uL (ref 0.0–0.1)
Basophils Relative: 1 % (ref 0–1)
Eosinophils Absolute: 0.6 10*3/uL (ref 0.0–0.7)
Eosinophils Relative: 8 % — ABNORMAL HIGH (ref 0–5)
HEMATOCRIT: 35.1 % — AB (ref 36.0–46.0)
HEMOGLOBIN: 11.2 g/dL — AB (ref 12.0–15.0)
LYMPHS ABS: 1.9 10*3/uL (ref 0.7–4.0)
LYMPHS PCT: 26 % (ref 12–46)
MCH: 28.6 pg (ref 26.0–34.0)
MCHC: 31.9 g/dL (ref 30.0–36.0)
MCV: 89.5 fL (ref 78.0–100.0)
MONO ABS: 0.5 10*3/uL (ref 0.1–1.0)
MPV: 9.2 fL (ref 8.6–12.4)
Monocytes Relative: 7 % (ref 3–12)
NEUTROS ABS: 4.2 10*3/uL (ref 1.7–7.7)
Neutrophils Relative %: 58 % (ref 43–77)
Platelets: 389 10*3/uL (ref 150–400)
RBC: 3.92 MIL/uL (ref 3.87–5.11)
RDW: 13.7 % (ref 11.5–15.5)
WBC: 7.3 10*3/uL (ref 4.0–10.5)

## 2015-10-01 LAB — COMPREHENSIVE METABOLIC PANEL
ALBUMIN: 3.5 g/dL — AB (ref 3.6–5.1)
ALK PHOS: 143 U/L — AB (ref 33–130)
ALT: 6 U/L (ref 6–29)
AST: 16 U/L (ref 10–35)
BILIRUBIN TOTAL: 0.2 mg/dL (ref 0.2–1.2)
BUN: 17 mg/dL (ref 7–25)
CALCIUM: 8.8 mg/dL (ref 8.6–10.4)
CO2: 30 mmol/L (ref 20–31)
Chloride: 102 mmol/L (ref 98–110)
Creat: 0.57 mg/dL (ref 0.50–1.05)
Glucose, Bld: 193 mg/dL — ABNORMAL HIGH (ref 65–99)
Potassium: 4.4 mmol/L (ref 3.5–5.3)
Sodium: 137 mmol/L (ref 135–146)
Total Protein: 6.7 g/dL (ref 6.1–8.1)

## 2015-10-01 LAB — GLUCOSE, POCT (MANUAL RESULT ENTRY): POC Glucose: 263 mg/dl — AB (ref 70–99)

## 2015-10-01 NOTE — Progress Notes (Signed)
Subjective:    Patient ID: [name], female    DOB: October 13, 1966, 49 y.o.   MRN: [id number]  HPI [name] is a 49 y.o. 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  She comes in for a PICC line dressing change and for follow up labs. She has no concerns at this time. Reports home fasting blood sugars have been in the 150-200 range and she denies hypoglycemia.  Past Medical History  Diagnosis Date  . Diabetes mellitus   . 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 Janie Morning), Cysto performed by Dr. Jonna Munro 12/2009 with evac of clots and bx and fulguration. // H/O stage 2 SCC of the vulva  . Anemia     2/2 blood loss  . Diabetes mellitus type 2, uncontrolled ([place]) DX: 2001  . Osteomyelitis ([place])     S/P removal 2nd MT head 01/29/11 - CX showing MSSA and GBS  . Diabetic foot ulcers ([place])   . Cataract   . Diabetes mellitus without complication [facility])     Past Surgical History  Procedure Laterality Date  . Radical wide local excision of the vulva and right nguinal lymph node dissection  10/2003    Dr. Fermin Schwab  . Uterine dilatation and currettage    . Labial mass excision  07/2003    Dr. Ree Edman  . Left second toe mtp joint amputation  12/19/2010    Dr. Mayer Camel  . Irrigation and debridement of left foot with removal of left  01/29/2011    Dr. Mayer Camel  . Tubal ligation    . I&d extremity Left 06/02/2015    Procedure: IRRIGATION AND DEBRIDEMENT EXTREMITY;  Surgeon: Leandrew Koyanagi, MD;  [facility];  Service: Orthopedics;  Laterality: Left;  . Amputation Left 06/02/2015    Procedure: Partial  Left  AMPUTATION FOOT;  Surgeon: Leandrew Koyanagi, MD;  [facility];  Service: Orthopedics;  Laterality: Left;  . I&d extremity Left 06/05/2015    Procedure: IRRIGATION AND DEBRIDEMENT LEFT LEG, POSSIBLE CLOSURE BELOW KNEE AMPUTATION;  Surgeon: Leandrew Koyanagi, MD;  Location: [place];  Service: Orthopedics;  Laterality: Left;  . Amputation Left 06/03/2015    Procedure: Left Transtibial amputation,application of wound vac;  Surgeon: Leandrew Koyanagi, MD;  [facility];  Service: Orthopedics;  Laterality: Left;  . I&d extremity Left 06/08/2015    Procedure: IRRIGATION AND DEBRIDEMENT LEFT LEG, WITH  CLOSURE OF BKA;  Surgeon: Leandrew Koyanagi, MD;  Location: [place];  Service: Orthopedics;  Laterality: Left;  . Tee without cardioversion N/A 06/11/2015    Procedure: TRANSESOPHAGEAL ECHOCARDIOGRAM (TEE);  Surgeon: Lelon Perla, MD;  Location: [facility] ENDOSCOPY;  Service: Cardiovascular;  Laterality: N/A;  . Amputation Left 06/26/2015    Procedure: AMPUTATION BELOW KNEE STUMP RECLOSURE ;  Surgeon: Marybelle Killings, MD;  Location: [place];  Service: Orthopedics;  Laterality: Left;  . Stump revision Left 09/03/2015    Procedure: LEFT LEG REVISION AMPUTATION;  Surgeon: Leandrew Koyanagi, MD;  Location: [place];  Service: Orthopedics;  Laterality: Left;  . I&d extremity Left 09/03/2015    Procedure: IRRIGATION AND DEBRIDEMENT LEFT BELOW KNEE AMPUTATION;  Surgeon: Leandrew Koyanagi, MD;  Location: [place];  Service: Orthopedics;  Laterality: Left;    Allergies  Allergen Reactions  . Cefazolin Rash    Current Outpatient Prescriptions on File Prior to Visit  Medication Sig Dispense Refill  . ceFAZolin (ANCEF) 2-3 GM-% SOLR Inject 50 mLs (2 g total) into the vein every 8 (eight) hours. 75 each 0  . glucose blood (TRUE METRIX BLOOD GLUCOSE TEST) test strip USE TO TEST BLOOD SUGAR 3 TIMES DAILY 100 each 12  . insulin aspart (NOVOLOG FLEXPEN) 100 UNIT/ML FlexPen Inject 10 Units into the skin 3 (three) times daily with meals. 15 mL 1  . Insulin  Glargine (LANTUS SOLOSTAR) 100 UNIT/ML Solostar Pen Inject 30 Units into the skin daily at 10 pm. 15 mL 2  . Insulin Pen Needle 32G X 4 MM MISC 1 application by Does not apply route 4 (four) times daily -  with meals and at bedtime. 100 each 1  . Multiple Vitamin (MULTIVITAMIN WITH MINERALS) TABS tablet Take 1 tablet by mouth daily. 30 tablet 1  . TRUEPLUS LANCETS 28G MISC USE TO TEST BLOOD SUGAR 3 TIMES A DAY 100 each 12   No current facility-administered medications on file prior to visit.     Review of Systems Constitutional: Negative for fever, chills, diaphoresis, activity change, appetite change and fatigue. HENT: Negative for ear pain, nosebleeds, congestion, facial swelling, rhinorrhea, neck pain, neck stiffness and ear discharge.  Eyes: Negative for pain, discharge, redness, itching and visual disturbance. Respiratory: Negative for cough, choking, chest tightness, shortness of breath, wheezing and stridor.  Cardiovascular: Negative for chest pain, palpitations and leg swelling. Gastrointestinal: Negative for abdominal distention. Genitourinary: Negative for dysuria, urgency, frequency, hematuria, flank pain, decreased urine volume, difficulty urinating and dyspareunia.  Musculoskeletal: See history of present illness Neurological: Negative for dizziness, tremors, seizures, syncope, facial asymmetry, speech difficulty, weakness, light-headedness, numbness and headaches.  Hematological: Negative for adenopathy. Does not bruise/bleed easily. Psychiatric/Behavioral: Negative for hallucinations, behavioral problems, confusion, dysphoric mood, decreased concentration and agitation.     Objective: Filed Vitals:   10/01/15 0959  BP: 107/67  Pulse: 91  Temp: 98.1 F (36.7 C)  TempSrc: Oral  Resp: 16  SpO2: 97%      Physical Exam Constitutional: Patient appears well-developed and well-nourished. No distress. Neck: Normal ROM. Neck supple. No JVD. No tracheal deviation. No  thyromegaly. CVS: RRR, S1/S2 +, no murmurs, no gallops, no carotid bruit.  Pulmonary: Effort and breath sounds normal, no stridor, rhonchi, wheezes, rales.  Abdominal: Soft. BS +,  no distension, tenderness, rebound or guarding.  Musculoskeletal: left BKA, staples in place and no evidence of infection noted right brachiocephalic PICC   Lymphadenopathy: No lymphadenopathy noted, cervical, inguinal or axillary Neuro: Alert. Normal reflexes, muscle tone coordination. No cranial nerve deficit. Skin: Skin is warm and dry; left knee abrasion.  Psychiatric: Normal mood and affect. Behavior, judgment, thought content normal.        Assessment & Plan:  Left stump osteomyelitis: Currently on IV Ancef and should remain on it till 10/15/15 Stump is healing well. CBC, CMET today.  Diabetes Mellitus: Uncontrolled with A1c of 10.10, CBG of 263 (random blood sugar) Blood sugars have improved based on reported home blood sugars. I will make no changes to her regimen at this time.  PICC line in place: I have inspected the site of her PICC line and that does not seem to be any evidence of infection or tenderness. PICC line dressing change performed  in the clinic today and she will return in one week for repeat dressing change as well as a CBC and CMP.  This note has been created with Surveyor, quantity. Any transcriptional errors are unintentional.

## 2015-10-01 NOTE — Progress Notes (Signed)
Pt's here f/up for 1 week followup Diabetes.

## 2015-10-02 LAB — MICROALBUMIN / CREATININE URINE RATIO
Creatinine, Urine: 71 mg/dL (ref 20–320)
Microalb Creat Ratio: 30 mcg/mg creat — ABNORMAL HIGH (ref <30)
Microalb, Ur: 2.1 mg/dL

## 2015-10-07 ENCOUNTER — Telehealth: Payer: Self-pay

## 2015-10-07 NOTE — Telephone Encounter (Signed)
Call placed to the patient to remind her of her appointment tomorrow, 10/08/15 @ 0915 for a PICC line dressing change and lab work. [place] Interpreter # [phone] with [employer] assisted with the calls.  A call was placed to # [phone] and the message noted that the person was unavailable and to try again later, no option for a voice mail message.  Call then placed to # [phone] and the patient's husband answered and then turned the call over to the patient.  She stated that the PICC line is " fine."  She noted that her husband administers the IV medication. She stated that her blood sugars have ranged from 70-150 and her glucometer does not have a memory and she does not write the results down. Instructed her regarding the importance of keeping a log of her blood sugars when she does the test. She verbalized understanding and stated that she would write her blood sugars down. Reminded her of her appointment tomorrow and she stated that she would be there. Instructed her to bring her supplies for her PICC line dressing change and she stated that she would.  She said she had no concerns/questions.

## 2015-10-07 NOTE — Telephone Encounter (Signed)
Attempted to contact the patient to check on her status and to remind her of her appointment tomorrow, 10/08/15 @ 0915 at the [facility] for a PICC dressing change and blood work.  Darrick Meigs, [place] speaking interpreter # [phone], with [facility] [employer] assisted with the call.  He noted that he tried to call the patent twice an both times the phone message stated that the person was unavailable and to try again later, no option for leaving a voice mail message.

## 2015-10-08 ENCOUNTER — Ambulatory Visit: Payer: Self-pay | Admitting: Family Medicine

## 2015-10-09 ENCOUNTER — Encounter: Payer: Self-pay | Admitting: Family Medicine

## 2015-10-09 ENCOUNTER — Ambulatory Visit: Payer: MEDICAID | Attending: Family Medicine | Admitting: Family Medicine

## 2015-10-09 ENCOUNTER — Telehealth: Payer: Self-pay

## 2015-10-09 VITALS — BP 94/62 | HR 95 | Temp 98.1°F | Resp 16

## 2015-10-09 DIAGNOSIS — E11628 Type 2 diabetes mellitus with other skin complications: Secondary | ICD-10-CM

## 2015-10-09 DIAGNOSIS — M869 Osteomyelitis, unspecified: Secondary | ICD-10-CM

## 2015-10-09 DIAGNOSIS — Z95828 Presence of other vascular implants and grafts: Secondary | ICD-10-CM

## 2015-10-09 DIAGNOSIS — A4901 Methicillin susceptible Staphylococcus aureus infection, unspecified site: Secondary | ICD-10-CM

## 2015-10-09 LAB — COMPREHENSIVE METABOLIC PANEL
ALK PHOS: 140 U/L — AB (ref 33–130)
ALT: 7 U/L (ref 6–29)
AST: 15 U/L (ref 10–35)
Albumin: 3.6 g/dL (ref 3.6–5.1)
BUN: 21 mg/dL (ref 7–25)
CALCIUM: 8.9 mg/dL (ref 8.6–10.4)
CHLORIDE: 102 mmol/L (ref 98–110)
CO2: 30 mmol/L (ref 20–31)
Creat: 0.59 mg/dL (ref 0.50–1.05)
GLUCOSE: 298 mg/dL — AB (ref 65–99)
POTASSIUM: 4.7 mmol/L (ref 3.5–5.3)
Sodium: 138 mmol/L (ref 135–146)
Total Bilirubin: 0.3 mg/dL (ref 0.2–1.2)
Total Protein: 6.9 g/dL (ref 6.1–8.1)

## 2015-10-09 LAB — CBC WITH DIFFERENTIAL/PLATELET
BASOS PCT: 1 % (ref 0–1)
Basophils Absolute: 0.1 10*3/uL (ref 0.0–0.1)
Eosinophils Absolute: 0.7 10*3/uL (ref 0.0–0.7)
Eosinophils Relative: 12 % — ABNORMAL HIGH (ref 0–5)
HCT: 35.5 % — ABNORMAL LOW (ref 36.0–46.0)
HEMOGLOBIN: 11.5 g/dL — AB (ref 12.0–15.0)
Lymphocytes Relative: 35 % (ref 12–46)
Lymphs Abs: 2 10*3/uL (ref 0.7–4.0)
MCH: 29 pg (ref 26.0–34.0)
MCHC: 32.4 g/dL (ref 30.0–36.0)
MCV: 89.6 fL (ref 78.0–100.0)
MONOS PCT: 6 % (ref 3–12)
MPV: 9.2 fL (ref 8.6–12.4)
Monocytes Absolute: 0.3 10*3/uL (ref 0.1–1.0)
NEUTROS ABS: 2.7 10*3/uL (ref 1.7–7.7)
NEUTROS PCT: 46 % (ref 43–77)
Platelets: 369 10*3/uL (ref 150–400)
RBC: 3.96 MIL/uL (ref 3.87–5.11)
RDW: 13.6 % (ref 11.5–15.5)
WBC: 5.8 10*3/uL (ref 4.0–10.5)

## 2015-10-09 LAB — GLUCOSE, POCT (MANUAL RESULT ENTRY): POC Glucose: 289 mg/dl — AB (ref 70–99)

## 2015-10-09 NOTE — Progress Notes (Signed)
Pt's here for f/up TCC. Picc line dressing. Pt wants to know when she's able to take the picc line out.  Pt states she ran out of the antibiotic.

## 2015-10-09 NOTE — Progress Notes (Signed)
Addendum to note on 10/09/15-[employer] telephonic interpreting used for language translation Faylene Million [phone]).

## 2015-10-09 NOTE — Progress Notes (Signed)
Subjective:    Patient ID: [name], female    DOB: 09-Nov-1966, 49 y.o.   MRN: [id number]  HPI [name] is a 49 y.o. 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  She comes in for a PICC line dressing change and for follow up labs and states she received her last supply of IV Ancef yesterday and has normal warmth to last the next week. She has no concerns at this time.   Past Medical History  Diagnosis Date  . Diabetes mellitus   . 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 Janie Morning), Cysto performed by Dr. Jonna Munro 12/2009 with evac of clots and bx and fulguration. // H/O stage 2 SCC of the vulva  . Anemia     2/2 blood loss  . Diabetes mellitus type 2, uncontrolled ([place]) DX: 2001  . Osteomyelitis ([place])     S/P removal 2nd MT head 01/29/11 - CX showing MSSA and GBS  . Diabetic foot ulcers ([place])   . Cataract   . Diabetes mellitus without complication [facility])     Past Surgical History  Procedure Laterality Date  . Radical wide local excision of the vulva and right nguinal lymph node dissection  10/2003    Dr. Fermin Schwab  . Uterine dilatation and currettage    . Labial mass excision  07/2003    Dr. Ree Edman  . Left second toe mtp joint amputation  12/19/2010    Dr. Mayer Camel  . Irrigation and debridement of left foot with removal of left  01/29/2011    Dr. Mayer Camel  . Tubal ligation    . I&d extremity Left 06/02/2015    Procedure: IRRIGATION AND DEBRIDEMENT EXTREMITY;  Surgeon: Leandrew Koyanagi, MD;  [facility];  Service: Orthopedics;  Laterality: Left;  . Amputation Left 06/02/2015   Procedure: Partial Left  AMPUTATION FOOT;  Surgeon: Leandrew Koyanagi, MD;  [facility];  Service: Orthopedics;  Laterality: Left;  . I&d extremity Left 06/05/2015    Procedure: IRRIGATION AND DEBRIDEMENT LEFT LEG, POSSIBLE CLOSURE BELOW KNEE AMPUTATION;  Surgeon: Leandrew Koyanagi, MD;  Location: [place];  Service: Orthopedics;  Laterality: Left;  . Amputation Left 06/03/2015    Procedure: Left Transtibial amputation,application of wound vac;  Surgeon: Leandrew Koyanagi, MD;  [facility];  Service: Orthopedics;  Laterality: Left;  . I&d extremity Left 06/08/2015    Procedure: IRRIGATION AND DEBRIDEMENT LEFT LEG, WITH  CLOSURE OF BKA;  Surgeon: Leandrew Koyanagi, MD;  Location: [place];  Service: Orthopedics;  Laterality: Left;  . Tee without cardioversion N/A 06/11/2015    Procedure: TRANSESOPHAGEAL ECHOCARDIOGRAM (TEE);  Surgeon: Lelon Perla, MD;  Location: [facility]COPY;  Service: Cardiovascular;  Laterality: N/A;  . Amputation Left 06/26/2015    Procedure: AMPUTATION BELOW KNEE STUMP RECLOSURE ;  Surgeon: Marybelle Killings, MD;  Location: [place];  Service: Orthopedics;  Laterality: Left;  . Stump revision Left 09/03/2015    Procedure: LEFT LEG REVISION AMPUTATION;  Surgeon: Leandrew Koyanagi, MD;  Location: [place];  Service: Orthopedics;  Laterality: Left;  . I&d extremity Left 09/03/2015    Procedure: IRRIGATION AND DEBRIDEMENT LEFT  BELOW KNEE AMPUTATION;  Surgeon: Leandrew Koyanagi, MD;  Location: [place];  Service: Orthopedics;  Laterality: Left;    Social History   Social History  . Marital Status: Married    Spouse Name: N/A  . Number of Children: N/A  . Years of Education: N/A   Occupational History  . Not on file.   Social History Main Topics  . Smoking status: Never Smoker   . Smokeless tobacco: Never Used  . Alcohol Use: No  . Drug Use: No  . Sexual Activity: Yes   Other Topics Concern  . Not on file   Social History Narrative   ** Merged History Encounter **        Allergies  Allergen Reactions    . Cefazolin Rash      Review of Systems Constitutional: Negative for fever, chills, diaphoresis, activity change, appetite change and fatigue. HENT: Negative for ear pain, nosebleeds, congestion, facial swelling, rhinorrhea, neck pain, neck stiffness and ear discharge.  Eyes: Negative for pain, discharge, redness, itching and visual disturbance. Respiratory: Negative for cough, choking, chest tightness, shortness of breath, wheezing and stridor.  Cardiovascular: Negative for chest pain, palpitations and leg swelling. Gastrointestinal: Negative for abdominal distention. Genitourinary: Negative for dysuria, urgency, frequency, hematuria, flank pain, decreased urine volume, difficulty urinating and dyspareunia.  Musculoskeletal: See history of present illness Neurological: Negative for dizziness, tremors, seizures, syncope, facial asymmetry, speech difficulty, weakness, light-headedness, numbness and headaches.  Hematological: Negative for adenopathy. Does not bruise/bleed easily. Psychiatric/Behavioral: Negative for hallucinations, behavioral problems, confusion, dysphoric mood, decreased concentration and agitation.        Objective: Filed Vitals:   10/09/15 0938  BP: 94/62  Pulse: 95  Temp: 98.1 F (36.7 C)  TempSrc: Oral  Resp: 16  SpO2: 97%      Physical Exam  Constitutional: Patient appears well-developed and well-nourished. No distress. Neck: Normal ROM. Neck supple. No JVD. No tracheal deviation. No thyromegaly. CVS: RRR, S1/S2 +, no murmurs, no gallops, no carotid bruit.  Pulmonary: Effort and breath sounds normal, no stridor, rhonchi, wheezes, rales.  Abdominal: Soft. BS +,  no distension, tenderness, rebound or guarding.  Musculoskeletal: left BKA, staples in place and no evidence of infection noted right brachiocephalic PICC   Lymphadenopathy: No lymphadenopathy noted, cervical, inguinal or axillary Neuro: Alert. Normal reflexes, muscle tone coordination. No cranial  nerve deficit. Skin: Skin is warm and dry; left knee abrasion.  Psychiatric: Normal mood and affect. Behavior, judgment, thought content normal.       Assessment & Plan:  Left stump osteomyelitis: Currently on IV Ancef and should remain on it till 10/15/15 Called [facility] to arrange for an additional supply of 1 more week until 10/15/15. Stump is healing well. CBC, CMET today.  Diabetes Mellitus: Uncontrolled with A1c of 10.10 Blood sugars have improved based on reported home blood sugars. I will make no changes to her regimen at this time.  PICC line in place: I have inspected the site of her PICC line and that does not seem to be any evidence of infection or tenderness. PICC line dressing change performed in the clinic today and she will return in one week for repeat dressing change as well as a CBC and CMP.  This note has been created with Surveyor, quantity. Any transcriptional errors are unintentional.

## 2015-10-09 NOTE — Telephone Encounter (Signed)
-----   Message from Arnoldo Morale, MD sent at 10/03/2015  1:55 PM EDT ----- Her labs reveal microalbuminuria which is likely secondary to poor diabetic control. Advised to adhere to strict diabetic diet and comply with prescribed dose of insulin.

## 2015-10-09 NOTE — Progress Notes (Signed)
Patient missed appointment on 10/08/15 to have labwork and PICC line dressing changed. She presented today and informed Dr. Jarold Song that she ran out of antibiotics (IV Ancef) yesterday and did not have anymore PICC line dressing kits. Patient supposed to receive IV Ancef until 10/15/15.   Placed call to Carolynn Sayers, [facility] Coordinator, who indicated there was a misunderstanding about patient's antibiotics so [facility] will deliver patient's last shipment of antibiotics (enough to last through 10/15/15)  as well as PICC line dressing kits to patient's home today.  Patient was updated of this; this Case Manager also stressed importance of patient administering antibiotic every 8 hours until 10/15/15. Patient and patient's spouse verbalized understanding. Also informed patient that she will need to bring PICC line dressing kit to the clinic tomorrow as PICC line dressing due to be changed. Patient's spouse indicated he will bring patient to the clinic tomorrow, 10/10/15, for a PICC line dressing change. Dr. Jarold Song updated.

## 2015-10-09 NOTE — Telephone Encounter (Signed)
Pt was in clinic for her f/up and was given lab results. Pt verbalized that she understood with no further questions.   [place] interpreter was used. [place] [phone]

## 2015-10-10 ENCOUNTER — Ambulatory Visit: Payer: Managed Care, Other (non HMO) | Attending: Family Medicine

## 2015-10-10 DIAGNOSIS — Z4800 Encounter for change or removal of nonsurgical wound dressing: Secondary | ICD-10-CM | POA: Insufficient documentation

## 2015-10-10 DIAGNOSIS — Z95828 Presence of other vascular implants and grafts: Secondary | ICD-10-CM

## 2015-10-10 NOTE — Progress Notes (Signed)
Patient came in for PICC line dressing change. No redness around site, site cleaned, PICC line dressing reapplied.

## 2015-10-15 ENCOUNTER — Telehealth: Payer: Self-pay

## 2015-10-15 NOTE — Telephone Encounter (Signed)
Call placed to the patient with the assistance of Onnie Graham, [place] Interpreter # [phone] with [facility] [employer], to confirm her appointment for tomorrow, 10/16/15.  The patient stated that she does not have transportation at 1600, only at 1100.   This CM spoke to Rolanda Lundborg, [facility] scheduler and confirmed that the patient can come to the clinic at 1100 tomorrow to have her PICC line removed, This was also confirmed with Dr Jarold Song as the patient is listed on her schedule with an appointment at 1600.    The patient noted that she is supposed to be done with her IV antibiotics today, 11/151/6 but she has one more dose left for 10/16/15 @ 0500 and stated that she has not missed any doses.   As per Dr Jarold Song, the patient is to take the last dose tomorrow. . This CM informed her to administer the dose tomorrow morning as she has the medication and she stated that she would.  No other questions/concerns reported.

## 2015-10-16 ENCOUNTER — Encounter: Payer: Self-pay | Admitting: Family Medicine

## 2015-10-16 ENCOUNTER — Ambulatory Visit: Payer: Managed Care, Other (non HMO) | Attending: Family Medicine | Admitting: Family Medicine

## 2015-10-16 VITALS — BP 108/65 | HR 95 | Temp 98.3°F | Resp 16

## 2015-10-16 DIAGNOSIS — Z452 Encounter for adjustment and management of vascular access device: Secondary | ICD-10-CM | POA: Insufficient documentation

## 2015-10-16 DIAGNOSIS — E11628 Type 2 diabetes mellitus with other skin complications: Secondary | ICD-10-CM

## 2015-10-16 DIAGNOSIS — M869 Osteomyelitis, unspecified: Secondary | ICD-10-CM | POA: Insufficient documentation

## 2015-10-16 DIAGNOSIS — Z95828 Presence of other vascular implants and grafts: Secondary | ICD-10-CM

## 2015-10-16 DIAGNOSIS — E1065 Type 1 diabetes mellitus with hyperglycemia: Secondary | ICD-10-CM | POA: Insufficient documentation

## 2015-10-16 DIAGNOSIS — Z794 Long term (current) use of insulin: Secondary | ICD-10-CM | POA: Insufficient documentation

## 2015-10-16 DIAGNOSIS — Z89512 Acquired absence of left leg below knee: Secondary | ICD-10-CM

## 2015-10-16 DIAGNOSIS — Z8544 Personal history of malignant neoplasm of other female genital organs: Secondary | ICD-10-CM | POA: Insufficient documentation

## 2015-10-16 LAB — AFB CULTURE WITH SMEAR (NOT AT ARMC)
Acid Fast Smear: NONE SEEN
Acid Fast Smear: NONE SEEN

## 2015-10-16 LAB — CBC WITH DIFFERENTIAL/PLATELET
BASOS ABS: 0.1 10*3/uL (ref 0.0–0.1)
BASOS PCT: 1 % (ref 0–1)
EOS PCT: 9 % — AB (ref 0–5)
Eosinophils Absolute: 0.7 10*3/uL (ref 0.0–0.7)
HEMATOCRIT: 34.1 % — AB (ref 36.0–46.0)
Hemoglobin: 11.2 g/dL — ABNORMAL LOW (ref 12.0–15.0)
LYMPHS PCT: 30 % (ref 12–46)
Lymphs Abs: 2.4 10*3/uL (ref 0.7–4.0)
MCH: 28.8 pg (ref 26.0–34.0)
MCHC: 32.8 g/dL (ref 30.0–36.0)
MCV: 87.7 fL (ref 78.0–100.0)
MONO ABS: 0.4 10*3/uL (ref 0.1–1.0)
MPV: 9.2 fL (ref 8.6–12.4)
Monocytes Relative: 5 % (ref 3–12)
Neutro Abs: 4.4 10*3/uL (ref 1.7–7.7)
Neutrophils Relative %: 55 % (ref 43–77)
PLATELETS: 338 10*3/uL (ref 150–400)
RBC: 3.89 MIL/uL (ref 3.87–5.11)
RDW: 14 % (ref 11.5–15.5)
WBC: 8 10*3/uL (ref 4.0–10.5)

## 2015-10-16 LAB — COMPREHENSIVE METABOLIC PANEL
ALK PHOS: 97 U/L (ref 33–130)
ALT: 4 U/L — AB (ref 6–29)
AST: 16 U/L (ref 10–35)
Albumin: 3.6 g/dL (ref 3.6–5.1)
BILIRUBIN TOTAL: 0.3 mg/dL (ref 0.2–1.2)
BUN: 18 mg/dL (ref 7–25)
CO2: 29 mmol/L (ref 20–31)
CREATININE: 0.6 mg/dL (ref 0.50–1.05)
Calcium: 8.7 mg/dL (ref 8.6–10.4)
Chloride: 103 mmol/L (ref 98–110)
GLUCOSE: 105 mg/dL — AB (ref 65–99)
Potassium: 4.4 mmol/L (ref 3.5–5.3)
SODIUM: 140 mmol/L (ref 135–146)
Total Protein: 6.6 g/dL (ref 6.1–8.1)

## 2015-10-16 LAB — GLUCOSE, POCT (MANUAL RESULT ENTRY): POC GLUCOSE: 116 mg/dL — AB (ref 70–99)

## 2015-10-16 NOTE — Progress Notes (Signed)
Subjective:    Patient ID: [name], female    DOB: 1966-04-04, 49 y.o.   MRN: [id number]  HPI [name] is a 49 y.o. 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  She comes in for a PICC line removal. She has no concerns at this time.  Past Medical History  Diagnosis Date  . Diabetes mellitus   . 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 Janie Morning), Cysto performed by Dr. Jonna Munro 12/2009 with evac of clots and bx and fulguration. // H/O stage 2 SCC of the vulva  . Anemia     2/2 blood loss  . Diabetes mellitus type 2, uncontrolled ([place]) DX: 2001  . Osteomyelitis ([place])     S/P removal 2nd MT head 01/29/11 - CX showing MSSA and GBS  . Diabetic foot ulcers ([place])   . Cataract   . Diabetes mellitus without complication [facility])     Past Surgical History  Procedure Laterality Date  . Radical wide local excision of the vulva and right nguinal lymph node dissection  10/2003    Dr. Fermin Schwab  . Uterine dilatation and currettage    . Labial mass excision  07/2003    Dr. Ree Edman  . Left second toe mtp joint amputation  12/19/2010    Dr. Mayer Camel  . Irrigation and debridement of left foot with removal of left  01/29/2011    Dr. Mayer Camel  . Tubal ligation    . I&d extremity Left 06/02/2015    Procedure: IRRIGATION AND DEBRIDEMENT EXTREMITY;  Surgeon: Leandrew Koyanagi, MD;  [facility];  Service: Orthopedics;  Laterality: Left;  . Amputation Left 06/02/2015    Procedure: Partial Left  AMPUTATION FOOT;  Surgeon: Leandrew Koyanagi, MD;  [facility];  Service: Orthopedics;  Laterality: Left;  . I&d extremity  Left 06/05/2015    Procedure: IRRIGATION AND DEBRIDEMENT LEFT LEG, POSSIBLE CLOSURE BELOW KNEE AMPUTATION;  Surgeon: Leandrew Koyanagi, MD;  Location: [place];  Service: Orthopedics;  Laterality: Left;  . Amputation Left 06/03/2015    Procedure: Left Transtibial amputation,application of wound vac;  Surgeon: Leandrew Koyanagi, MD;  [facility];  Service: Orthopedics;  Laterality: Left;  . I&d extremity Left 06/08/2015    Procedure: IRRIGATION AND DEBRIDEMENT LEFT LEG, WITH  CLOSURE OF BKA;  Surgeon: Leandrew Koyanagi, MD;  Location: [place];  Service: Orthopedics;  Laterality: Left;  . Tee without cardioversion N/A 06/11/2015    Procedure: TRANSESOPHAGEAL ECHOCARDIOGRAM (TEE);  Surgeon: Lelon Perla, MD;  Location: [facility] ENDOSCOPY;  Service: Cardiovascular;  Laterality: N/A;  . Amputation Left 06/26/2015    Procedure: AMPUTATION BELOW KNEE STUMP RECLOSURE ;  Surgeon: Marybelle Killings, MD;  Location: [place];  Service: Orthopedics;  Laterality: Left;  . Stump revision Left 09/03/2015    Procedure: LEFT LEG REVISION AMPUTATION;  Surgeon: Leandrew Koyanagi, MD;  Location: [place];  Service: Orthopedics;  Laterality: Left;  . I&d extremity Left 09/03/2015    Procedure: IRRIGATION AND DEBRIDEMENT LEFT BELOW KNEE AMPUTATION;  Surgeon: Leandrew Koyanagi, MD;  Location: [place];  Service: Orthopedics;  Laterality: Left;    Social History  Social History  . Marital Status: Married    Spouse Name: N/A  . Number of Children: N/A  . Years of Education: N/A   Occupational History  . Not on file.   Social History Main Topics  . Smoking status: Never Smoker   . Smokeless tobacco: Never Used  . Alcohol Use: No  . Drug Use: No  . Sexual Activity: Yes   Other Topics Concern  . Not on file   Social History Narrative   ** Merged History Encounter **        Allergies  Allergen Reactions  . Cefazolin Rash    Current Outpatient Prescriptions on File Prior to Visit  Medication Sig Dispense Refill  . ceFAZolin (ANCEF) 2-3 GM-% SOLR  Inject 50 mLs (2 g total) into the vein every 8 (eight) hours. 75 each 0  . glucose blood (TRUE METRIX BLOOD GLUCOSE TEST) test strip USE TO TEST BLOOD SUGAR 3 TIMES DAILY 100 each 12  . insulin aspart (NOVOLOG FLEXPEN) 100 UNIT/ML FlexPen Inject 10 Units into the skin 3 (three) times daily with meals. 15 mL 1  . Insulin Glargine (LANTUS SOLOSTAR) 100 UNIT/ML Solostar Pen Inject 30 Units into the skin daily at 10 pm. 15 mL 2  . Insulin Pen Needle 32G X 4 MM MISC 1 application by Does not apply route 4 (four) times daily -  with meals and at bedtime. 100 each 1  . Multiple Vitamin (MULTIVITAMIN WITH MINERALS) TABS tablet Take 1 tablet by mouth daily. 30 tablet 1  . TRUEPLUS LANCETS 28G MISC USE TO TEST BLOOD SUGAR 3 TIMES A DAY 100 each 12   No current facility-administered medications on file prior to visit.      Review of Systems Constitutional: Negative for fever, chills, diaphoresis, activity change, appetite change and fatigue. HENT: Negative for ear pain, nosebleeds, congestion, facial swelling, rhinorrhea, neck pain, neck stiffness and ear discharge.  Eyes: Negative for pain, discharge, redness, itching and visual disturbance. Respiratory: Negative for cough, choking, chest tightness, shortness of breath, wheezing and stridor.  Cardiovascular: Negative for chest pain, palpitations and leg swelling. Gastrointestinal: Negative for abdominal distention. Genitourinary: Negative for dysuria, urgency, frequency, hematuria, flank pain, decreased urine volume, difficulty urinating and dyspareunia.  Musculoskeletal: See history of present illness Neurological: Negative for dizziness, tremors, seizures, syncope, facial asymmetry, speech difficulty, weakness, light-headedness, numbness and headaches.  Hematological: Negative for adenopathy. Does not bruise/bleed easily. Psychiatric/Behavioral: Negative for hallucinations, behavioral problems, confusion, dysphoric mood, decreased concentration and  agitation    Objective: Filed Vitals:   10/16/15 1139  BP: 108/65  Pulse: 95  Temp: 98.3 F (36.8 C)  TempSrc: Oral  Resp: 16  SpO2: 99%      Physical Exam Constitutional: Patient appears well-developed and well-nourished. No distress. Neck: Normal ROM. Neck supple. No JVD. No tracheal deviation. No thyromegaly. CVS: RRR, S1/S2 +, no murmurs, no gallops, no carotid bruit.  Pulmonary: Effort and breath sounds normal, no stridor, rhonchi, wheezes, rales.  Abdominal: Soft. BS +,  no distension, tenderness, rebound or guarding.  Musculoskeletal: left BKA, staples in place and no evidence of infection noted. Lymphadenopathy: No lymphadenopathy noted, cervical, inguinal or axillary Neuro: Alert. Normal reflexes, muscle tone coordination. No cranial nerve deficit. Skin: Skin is warm and dry. Psychiatric: Normal mood and affect. Behavior, judgment, thought content normal.      Assessment & Plan:  Left stump osteomyelitis: Completed course of IV Ancef  Stump is healing well. CBC, CMET today.  Diabetes Mellitus: Uncontrolled  with A1c of 10.10 Blood sugars have improved based on reported home blood sugars. I will make no changes to her regimen at this time.  PICC line removed and tip sent for culture; patient observed lying down for 20 minutes after which she was discharged.

## 2015-10-16 NOTE — Progress Notes (Signed)
Pt's here for Picc line removal. Pt denies any pain today.  Pt has not taken meds today.

## 2015-10-16 NOTE — Patient Instructions (Signed)
Diabetes Mellitus and Food It is important for you to manage your blood sugar (glucose) level. Your blood glucose level can be greatly affected by what you eat. Eating healthier foods in the appropriate amounts throughout the day at about the same time each day will help you control your blood glucose level. It can also help slow or prevent worsening of your diabetes mellitus. Healthy eating may even help you improve the level of your blood pressure and reach or maintain a healthy weight.  General recommendations for healthful eating and cooking habits include:  Eating meals and snacks regularly. Avoid going long periods of time without eating to lose weight.  Eating a diet that consists mainly of plant-based foods, such as fruits, vegetables, nuts, legumes, and whole grains.  Using low-heat cooking methods, such as baking, instead of high-heat cooking methods, such as deep frying. Work with your dietitian to make sure you understand how to use the Nutrition Facts information on food labels. HOW CAN FOOD AFFECT ME? Carbohydrates Carbohydrates affect your blood glucose level more than any other type of food. Your dietitian will help you determine how many carbohydrates to eat at each meal and teach you how to count carbohydrates. Counting carbohydrates is important to keep your blood glucose at a healthy level, especially if you are using insulin or taking certain medicines for diabetes mellitus. Alcohol Alcohol can cause sudden decreases in blood glucose (hypoglycemia), especially if you use insulin or take certain medicines for diabetes mellitus. Hypoglycemia can be a life-threatening condition. Symptoms of hypoglycemia (sleepiness, dizziness, and disorientation) are similar to symptoms of having too much alcohol.  If your health care provider has given you approval to drink alcohol, do so in moderation and use the following guidelines:  Women should not have more than one drink per day, and men  should not have more than two drinks per day. One drink is equal to:  12 oz of beer.  5 oz of wine.  1 oz of hard liquor.  Do not drink on an empty stomach.  Keep yourself hydrated. Have water, diet soda, or unsweetened iced tea.  Regular soda, juice, and other mixers might contain a lot of carbohydrates and should be counted. WHAT FOODS ARE NOT RECOMMENDED? As you make food choices, it is important to remember that all foods are not the same. Some foods have fewer nutrients per serving than other foods, even though they might have the same number of calories or carbohydrates. It is difficult to get your body what it needs when you eat foods with fewer nutrients. Examples of foods that you should avoid that are high in calories and carbohydrates but low in nutrients include:  Trans fats (most processed foods list trans fats on the Nutrition Facts label).  Regular soda.  Juice.  Candy.  Sweets, such as cake, pie, doughnuts, and cookies.  Fried foods. WHAT FOODS CAN I EAT? Eat nutrient-rich foods, which will nourish your body and keep you healthy. The food you should eat also will depend on several factors, including:  The calories you need.  The medicines you take.  Your weight.  Your blood glucose level.  Your blood pressure level.  Your cholesterol level. You should eat a variety of foods, including:  Protein.  Lean cuts of meat.  Proteins low in saturated fats, such as fish, egg whites, and beans. Avoid processed meats.  Fruits and vegetables.  Fruits and vegetables that may help control blood glucose levels, such as apples, mangoes, and   yams.  Dairy products.  Choose fat-free or low-fat dairy products, such as milk, yogurt, and cheese.  Grains, bread, pasta, and rice.  Choose whole grain products, such as multigrain bread, whole oats, and brown rice. These foods may help control blood pressure.  Fats.  Foods containing healthful fats, such as nuts,  avocado, olive oil, canola oil, and fish. DOES EVERYONE WITH DIABETES MELLITUS HAVE THE SAME MEAL PLAN? Because every person with diabetes mellitus is different, there is not one meal plan that works for everyone. It is very important that you meet with a dietitian who will help you create a meal plan that is just right for you.   This information is not intended to replace advice given to you by your health care provider. Make sure you discuss any questions you have with your health care provider.   Document Released: 08/13/2005 Document Revised: 12/07/2014 Document Reviewed: 10/13/2013 Elsevier Interactive Patient Education 2016 Elsevier Inc.  

## 2015-10-18 ENCOUNTER — Telehealth: Payer: Self-pay

## 2015-10-18 NOTE — Telephone Encounter (Signed)
-----   Message from Arnoldo Morale, MD sent at 10/11/2015  8:28 AM EST ----- Labs are stable

## 2015-10-18 NOTE — Telephone Encounter (Signed)
CMA called [facility] interpreter [place] [phone]. We were able to get a pick on the line, but someone answered and didn't say anything after that. Interpreter called again, but this time we got the voicemail.

## 2015-10-19 LAB — CATH TIP CULTURE: Organism ID, Bacteria: NO GROWTH

## 2015-10-21 ENCOUNTER — Ambulatory Visit (INDEPENDENT_AMBULATORY_CARE_PROVIDER_SITE_OTHER): Payer: Medicaid Other | Admitting: Internal Medicine

## 2015-10-21 ENCOUNTER — Encounter: Payer: Self-pay | Admitting: Internal Medicine

## 2015-10-21 VITALS — BP 104/67 | HR 94 | Temp 98.8°F

## 2015-10-21 DIAGNOSIS — T8740 Infection of amputation stump, unspecified extremity: Secondary | ICD-10-CM

## 2015-10-21 NOTE — Assessment & Plan Note (Signed)
I am hopeful that her BKA stump infection has been cured by her recent revision BKA and 6 weeks of antibiotic therapy. I will continue observation off of antibiotics.
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
## 2015-11-01 NOTE — Telephone Encounter (Signed)
Patient will be sent a letter to address on file. 2nd attempt to contact patient.

## 2015-11-18 ENCOUNTER — Ambulatory Visit: Payer: Self-pay | Admitting: Internal Medicine

## 2015-12-05 ENCOUNTER — Other Ambulatory Visit: Payer: Self-pay | Admitting: *Deleted

## 2015-12-05 MED ORDER — INSULIN GLARGINE 300 UNIT/ML ~~LOC~~ SOPN: 30.0000 [IU]/d | PEN_INJECTOR | Freq: Every day | SUBCUTANEOUS | Status: DC

## 2015-12-05 NOTE — Progress Notes (Signed)
Orland Dec came to this RN stating that the patient's Lantus coupon card had expired for Lantus and she was unable to participate in the pass program because she does not have a social security number.  Orland Dec indicated that Toujeo was basically the same medication as Lantus and asked RN to switch prescription.

## 2016-01-12 IMAGING — CR DG TIBIA/FIBULA 2V*L*
2 series · 2 of 2 positions shown · non-contrast
Comparison: 06/26/2015

CLINICAL DATA: Purulent drainage at amputation stump.

EXAM:
LEFT TIBIA AND FIBULA - 2 VIEW

[tibia ap (1 of 2)]
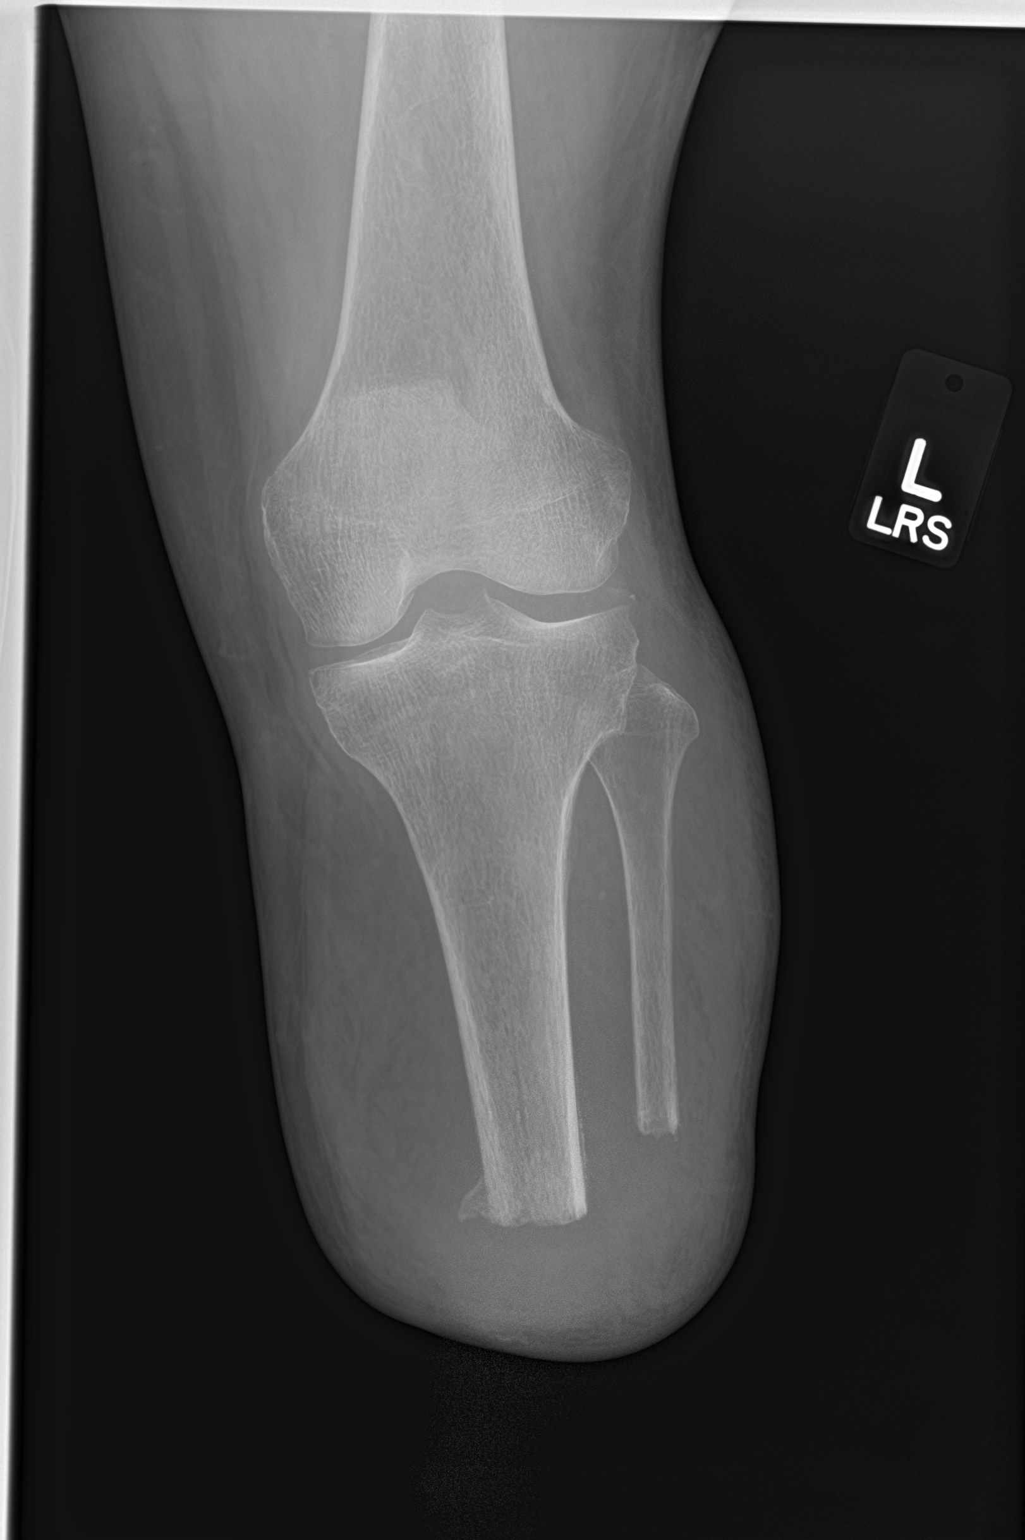

[tibia ap (2 of 2)]
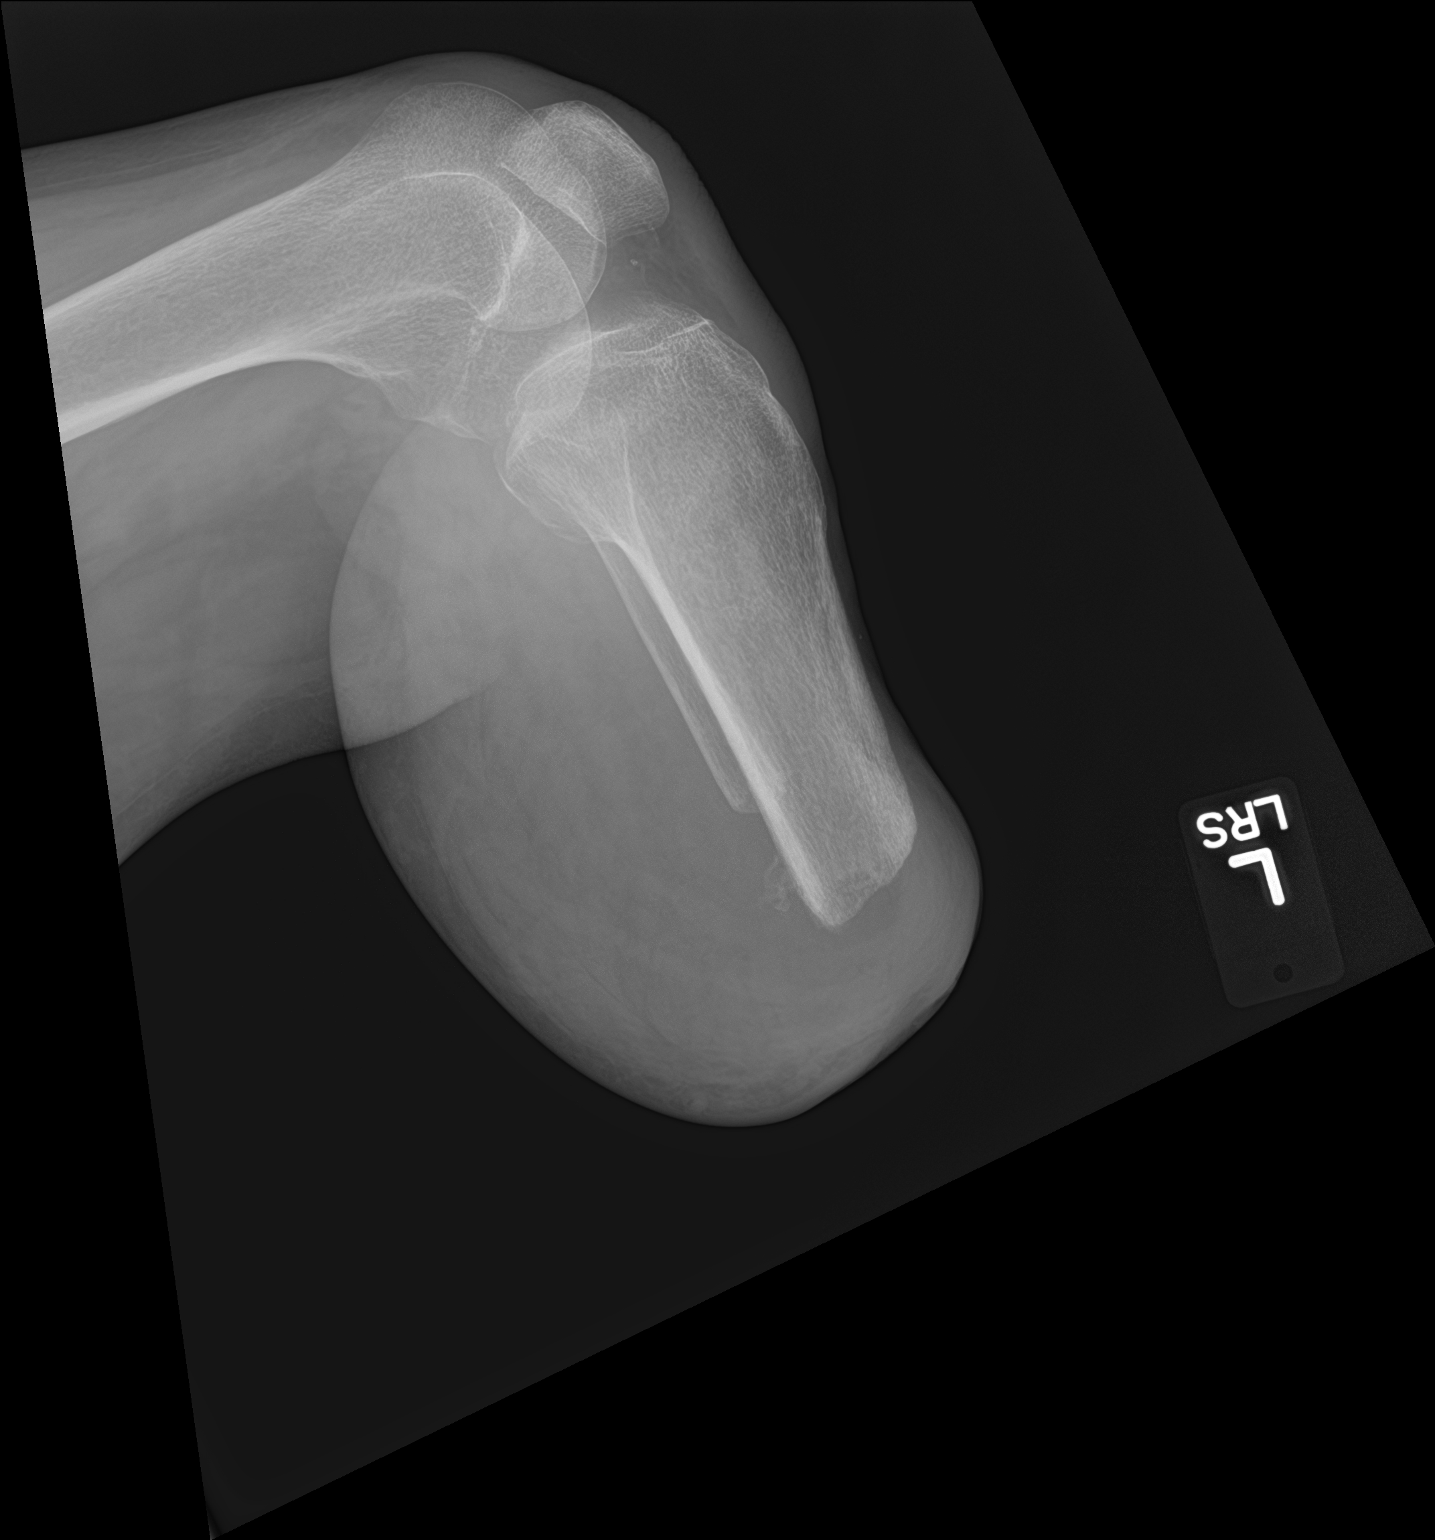

[2 of 2 positions shown; findings below may reference images not displayed]

FINDINGS: No soft tissue gas is evident. No bony destruction is evident. There
is no radiopaque foreign body.
IMPRESSION: Negative for soft tissue gas or bony destruction. MRI will be more
sensitive and specific for osteomyelitis if additional imaging is
clinically warranted.

## 2016-01-16 IMAGING — XA IR FLUORO GUIDE CV LINE*R*
1 series · 2 of 2 positions shown · non-contrast
Comparison: none

CLINICAL DATA: Osteomyelitis at left below-knee amputation site
with need for long-term IV antibiotic therapy.

[Series 1: run · 2 of 2 slices shown]
[im 1/2]
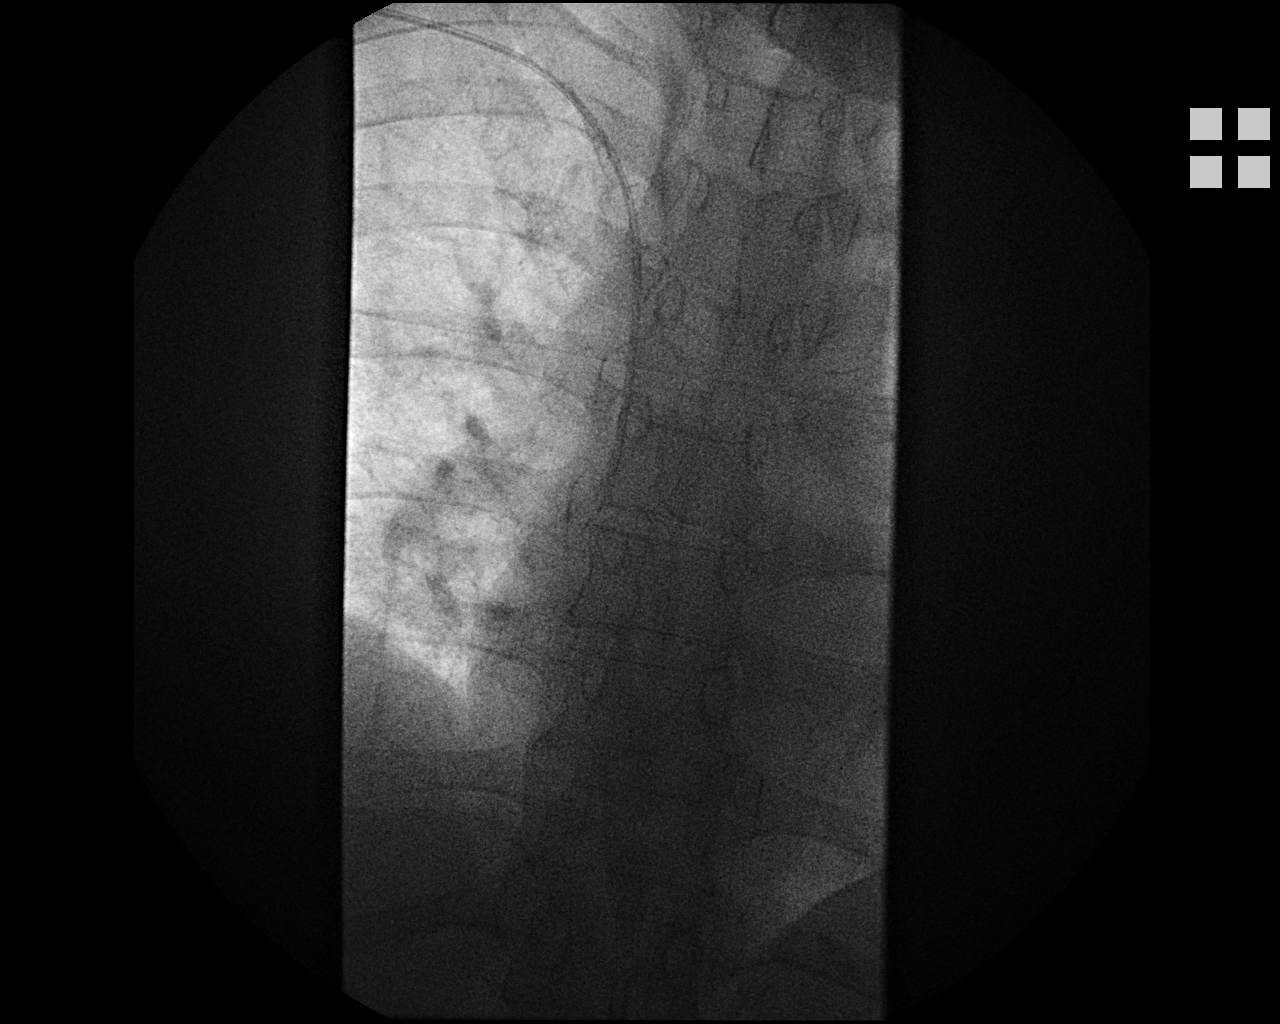
[im 2/2]
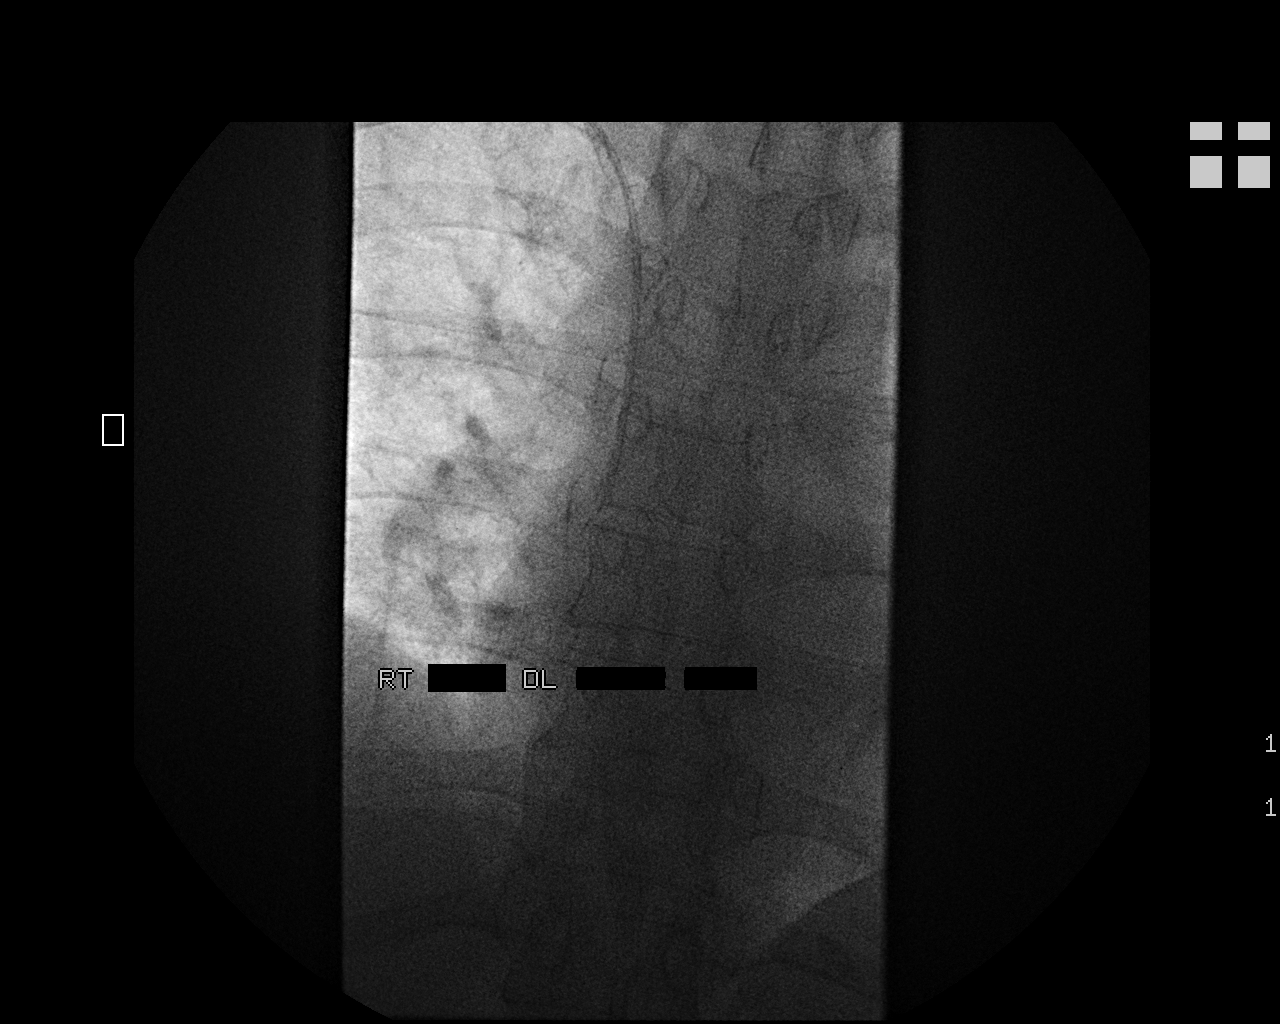

[2 of 2 positions shown; findings below may reference images not displayed]

EXAM:
POWER PICC LINE PLACEMENT WITH ULTRASOUND AND FLUOROSCOPIC GUIDANCE

FLUOROSCOPY TIME:  54 seconds.

PROCEDURE:
The patient was advised of the possible risks and complications and
agreed to undergo the procedure. A Spanish interpreter was utilized
for consent. The patient was then brought to the angiographic suite
for the procedure.

The right arm was prepped with chlorhexidine, drape [place] the usual
sterile fashion using maximum barrier technique (cap and mask,
sterile gown, sterile gloves, large sterile sheet, hand hygiene and
cutaneous antisepsis) and infiltrated locally with 1% Lidocaine.

Ultrasound demonstrated patency of the right brachial vein, and this
was documented with an image. Under real-time ultrasound guidance,
this vein was accessed with a 21 gauge micropuncture needle and
image documentation was performed. A [DATE] wire was introduced in to
the vein. Over this, a 5.0 French dual lumen power injectable PICC
was advanced to the lower SVC/right atrial junction. Fluoroscopy
during the procedure and fluoro spot radiograph confirms appropriate
catheter position. The catheter was flushed and covered with a
sterile dressing.

Catheter length: 38 cm

COMPLICATIONS:
None
IMPRESSION: Successful right arm power injectable PICC line placement with
ultrasound and fluoroscopic guidance. The catheter is ready for use.

## 2016-02-05 ENCOUNTER — Ambulatory Visit: Payer: Medicaid Other | Attending: Family Medicine

## 2016-02-19 ENCOUNTER — Other Ambulatory Visit: Payer: Self-pay | Admitting: Orthopaedic Surgery

## 2016-02-19 ENCOUNTER — Encounter (HOSPITAL_COMMUNITY): Payer: Self-pay | Admitting: *Deleted

## 2016-02-19 NOTE — Progress Notes (Signed)
Pt SDW -pre-op call completed by Spanish interpreters Percell Miller ID # [phone] and Vicente Males. Pt denies SOB, chest pain, and being under the care of a cardiologist. Pt denies having a stress test and cardiac cath. Pt made aware to stop vitamins, fish oil, herbal medications and NSAID's. Pt sated that her fasting BS is usually 100-120. Pt made aware of diabetes protocol to check BS every 2 hours DOS, interventions for BS<70 and > 220 and phone # to SS. Pt made made aware to take 24 units of Insulin Glargine (TOUJEO ) at HS instead of 30 units. Pt made aware to hold insulin DOS since NPO. Pt verbalized understanding of all pre-op instructions

## 2016-02-21 ENCOUNTER — Ambulatory Visit (HOSPITAL_COMMUNITY): Payer: Managed Care, Other (non HMO) | Admitting: Certified Registered Nurse Anesthetist

## 2016-02-21 ENCOUNTER — Ambulatory Visit (HOSPITAL_COMMUNITY)
Admission: RE | Admit: 2016-02-21 | Discharge: 2016-02-21 | Disposition: A | Discharge status: Home / Self Care | Payer: Managed Care, Other (non HMO) | Source: Ambulatory Visit | Attending: Orthopaedic Surgery | Admitting: Orthopaedic Surgery

## 2016-02-21 ENCOUNTER — Encounter (HOSPITAL_COMMUNITY): Payer: Self-pay | Admitting: *Deleted

## 2016-02-21 ENCOUNTER — Encounter (HOSPITAL_COMMUNITY)
Admission: RE | Disposition: A | Discharge status: Home / Self Care | Payer: Self-pay | Source: Ambulatory Visit | Attending: Orthopaedic Surgery

## 2016-02-21 DIAGNOSIS — Y835 Amputation of limb(s) as the cause of abnormal reaction of the patient, or of later complication, without mention of misadventure at the time of the procedure: Secondary | ICD-10-CM | POA: Diagnosis not present

## 2016-02-21 DIAGNOSIS — T8740 Infection of amputation stump, unspecified extremity: Secondary | ICD-10-CM | POA: Diagnosis present

## 2016-02-21 DIAGNOSIS — L02416 Cutaneous abscess of left lower limb: Secondary | ICD-10-CM | POA: Diagnosis not present

## 2016-02-21 DIAGNOSIS — E119 Type 2 diabetes mellitus without complications: Secondary | ICD-10-CM | POA: Insufficient documentation

## 2016-02-21 DIAGNOSIS — T8744 Infection of amputation stump, left lower extremity: Secondary | ICD-10-CM | POA: Diagnosis not present

## 2016-02-21 DIAGNOSIS — Z89512 Acquired absence of left leg below knee: Secondary | ICD-10-CM | POA: Insufficient documentation

## 2016-02-21 DIAGNOSIS — Z794 Long term (current) use of insulin: Secondary | ICD-10-CM | POA: Insufficient documentation

## 2016-02-21 DIAGNOSIS — M868X6 Other osteomyelitis, lower leg: Secondary | ICD-10-CM | POA: Insufficient documentation

## 2016-02-21 HISTORY — DX: Other specified postprocedural states: Z98.890

## 2016-02-21 HISTORY — DX: Nausea with vomiting, unspecified: R11.2

## 2016-02-21 HISTORY — PX: STUMP REVISION: SHX6102

## 2016-02-21 LAB — BASIC METABOLIC PANEL
ANION GAP: 9 (ref 5–15)
BUN: 26 mg/dL — ABNORMAL HIGH (ref 6–20)
CO2: 26 mmol/L (ref 22–32)
Calcium: 9.4 mg/dL (ref 8.9–10.3)
Chloride: 103 mmol/L (ref 101–111)
Creatinine, Ser: 0.85 mg/dL (ref 0.44–1.00)
GFR calc Af Amer: >60 mL/min (ref >60)
GFR calc non Af Amer: >60 mL/min (ref >60)
GLUCOSE: 328 mg/dL — AB (ref 65–99)
POTASSIUM: 3.9 mmol/L (ref 3.5–5.1)
Sodium: 138 mmol/L (ref 135–145)

## 2016-02-21 LAB — GLUCOSE, CAPILLARY
GLUCOSE-CAPILLARY: 113 mg/dL — AB (ref 65–99)
Glucose-Capillary: 451 mg/dL — ABNORMAL HIGH (ref 65–99)
Glucose-Capillary: 162 mg/dL — ABNORMAL HIGH (ref 65–99)
Glucose-Capillary: 142 mg/dL — ABNORMAL HIGH (ref 65–99)

## 2016-02-21 LAB — CBC
HEMATOCRIT: 34.5 % — AB (ref 36.0–46.0)
HEMOGLOBIN: 11.6 g/dL — AB (ref 12.0–15.0)
MCH: 29.5 pg (ref 26.0–34.0)
MCHC: 33.6 g/dL (ref 30.0–36.0)
MCV: 87.8 fL (ref 78.0–100.0)
Platelets: 357 10*3/uL (ref 150–400)
RBC: 3.93 MIL/uL (ref 3.87–5.11)
RDW: 13 % (ref 11.5–15.5)
WBC: 6 10*3/uL (ref 4.0–10.5)

## 2016-02-21 SURGERY — REVISION, AMPUTATION SITE
Anesthesia: General | Laterality: Left

## 2016-02-21 MED ORDER — INSULIN ASPART 100 UNIT/ML ~~LOC~~ SOLN
10.0000 [IU] | Freq: Once | SUBCUTANEOUS | Status: AC
  Administered 2016-02-21: 10 [IU] via INTRAVENOUS

## 2016-02-21 MED ORDER — FENTANYL CITRATE (PF) 100 MCG/2ML IJ SOLN
INTRAMUSCULAR | Status: DC | PRN
  Administered 2016-02-21: 25 ug via INTRAVENOUS

## 2016-02-21 MED ORDER — PROMETHAZINE HCL 25 MG/ML IJ SOLN
INTRAMUSCULAR | Status: AC
  Administered 2016-02-21: 6.25 mg via INTRAVENOUS
  Filled 2016-02-21: qty 1

## 2016-02-21 MED ORDER — HYDROCODONE-ACETAMINOPHEN 7.5-325 MG PO TABS: 1.0000 | ORAL_TABLET | Freq: Once | ORAL | Status: DC | PRN

## 2016-02-21 MED ORDER — SODIUM CHLORIDE 0.9 % IV SOLN: INTRAVENOUS | Status: DC | PRN

## 2016-02-21 MED ORDER — PROMETHAZINE HCL 25 MG/ML IJ SOLN
6.2500 mg | INTRAMUSCULAR | Status: DC | PRN
  Administered 2016-02-21: 6.25 mg via INTRAVENOUS

## 2016-02-21 MED ORDER — HYDROMORPHONE HCL 1 MG/ML IJ SOLN
0.2500 mg | INTRAMUSCULAR | Status: DC | PRN
  Administered 2016-02-21: 0.5 mg via INTRAVENOUS

## 2016-02-21 MED ORDER — MIDAZOLAM HCL 2 MG/2ML IJ SOLN
INTRAMUSCULAR | Status: AC
  Filled 2016-02-21: qty 2

## 2016-02-21 MED ORDER — LIDOCAINE HCL (CARDIAC) 20 MG/ML IV SOLN
INTRAVENOUS | Status: DC | PRN
  Administered 2016-02-21: 60 mg via INTRAVENOUS

## 2016-02-21 MED ORDER — OXYCODONE-ACETAMINOPHEN 5-325 MG PO TABS: 1.0000 | ORAL_TABLET | ORAL | Status: DC | PRN

## 2016-02-21 MED ORDER — INSULIN ASPART 100 UNIT/ML ~~LOC~~ SOLN
SUBCUTANEOUS | Status: AC
  Administered 2016-02-21: 10 [IU] via INTRAVENOUS
  Filled 2016-02-21: qty 1

## 2016-02-21 MED ORDER — LIDOCAINE HCL (CARDIAC) 20 MG/ML IV SOLN
INTRAVENOUS | Status: AC
  Filled 2016-02-21: qty 5

## 2016-02-21 MED ORDER — FENTANYL CITRATE (PF) 250 MCG/5ML IJ SOLN
INTRAMUSCULAR | Status: AC
  Filled 2016-02-21: qty 5

## 2016-02-21 MED ORDER — HYDROMORPHONE HCL 1 MG/ML IJ SOLN
INTRAMUSCULAR | Status: AC
  Filled 2016-02-21: qty 1

## 2016-02-21 MED ORDER — MIDAZOLAM HCL 5 MG/5ML IJ SOLN
INTRAMUSCULAR | Status: DC | PRN
  Administered 2016-02-21: 2 mg via INTRAVENOUS

## 2016-02-21 MED ORDER — PROPOFOL 10 MG/ML IV BOLUS
INTRAVENOUS | Status: AC
  Filled 2016-02-21: qty 20

## 2016-02-21 MED ORDER — LACTATED RINGERS IV SOLN
INTRAVENOUS | Status: DC
  Administered 2016-02-21 (×3): via INTRAVENOUS

## 2016-02-21 MED ORDER — ROCURONIUM BROMIDE 50 MG/5ML IV SOLN
INTRAVENOUS | Status: AC
  Filled 2016-02-21: qty 1

## 2016-02-21 MED ORDER — SULFAMETHOXAZOLE-TRIMETHOPRIM 800-160 MG PO TABS: 1.0000 | ORAL_TABLET | Freq: Two times a day (BID) | ORAL | Status: DC

## 2016-02-21 MED ORDER — ONDANSETRON HCL 4 MG/2ML IJ SOLN
INTRAMUSCULAR | Status: AC
  Filled 2016-02-21: qty 2

## 2016-02-21 MED ORDER — PROPOFOL 10 MG/ML IV BOLUS
INTRAVENOUS | Status: DC | PRN
  Administered 2016-02-21: 15 mg via INTRAVENOUS

## 2016-02-21 MED ORDER — 0.9 % SODIUM CHLORIDE (POUR BTL) OPTIME
TOPICAL | Status: DC | PRN
  Administered 2016-02-21: 3000 mL

## 2016-02-21 MED ORDER — CLINDAMYCIN PHOSPHATE 900 MG/50ML IV SOLN
900.0000 mg | INTRAVENOUS | Status: AC
  Administered 2016-02-21: 900 mg via INTRAVENOUS
  Filled 2016-02-21: qty 50

## 2016-02-21 MED ORDER — ONDANSETRON HCL 4 MG/2ML IJ SOLN
INTRAMUSCULAR | Status: DC | PRN
  Administered 2016-02-21: 4 mg via INTRAVENOUS

## 2016-02-21 MED ORDER — PHENYLEPHRINE HCL 10 MG/ML IJ SOLN
INTRAMUSCULAR | Status: DC | PRN
  Administered 2016-02-21 (×2): 80 ug via INTRAVENOUS
  Administered 2016-02-21: 40 ug via INTRAVENOUS
  Administered 2016-02-21: 80 ug via INTRAVENOUS
  Administered 2016-02-21: 40 ug via INTRAVENOUS
  Administered 2016-02-21 (×2): 80 ug via INTRAVENOUS
  Administered 2016-02-21: 40 ug via INTRAVENOUS
  Administered 2016-02-21 (×2): 80 ug via INTRAVENOUS

## 2016-02-21 SURGICAL SUPPLY — 47 items
BANDAGE ESMARK 6X9 LF (GAUZE/BANDAGES/DRESSINGS) ×1 IMPLANT
BLADE SAW RECIP 87.9 MT (BLADE) ×3 IMPLANT
BNDG CMPR 9X6 STRL LF SNTH (GAUZE/BANDAGES/DRESSINGS)
BNDG COHESIVE 4X5 TAN STRL (GAUZE/BANDAGES/DRESSINGS) ×3 IMPLANT
BNDG COHESIVE 6X5 TAN STRL LF (GAUZE/BANDAGES/DRESSINGS) ×4 IMPLANT
BNDG ESMARK 6X9 LF (GAUZE/BANDAGES/DRESSINGS)
BNDG GAUZE ELAST 4 BULKY (GAUZE/BANDAGES/DRESSINGS) ×8 IMPLANT
COVER SURGICAL LIGHT HANDLE (MISCELLANEOUS) ×3 IMPLANT
CUFF TOURNIQUET SINGLE 34IN LL (TOURNIQUET CUFF) ×3 IMPLANT
DRAPE EXTREMITY T 121X128X90 (DRAPE) ×3 IMPLANT
DRAPE ORTHO SPLIT 77X108 STRL (DRAPES)
DRAPE PROXIMA HALF (DRAPES) ×3 IMPLANT
DRAPE SURG ORHT 6 SPLT 77X108 (DRAPES) IMPLANT
DRAPE U-SHAPE 47X51 STRL (DRAPES) ×3 IMPLANT
DRSG PAD ABDOMINAL 8X10 ST (GAUZE/BANDAGES/DRESSINGS) ×3 IMPLANT
ELECT CAUTERY BLADE 6.4 (BLADE) ×3 IMPLANT
ELECT REM PT RETURN 9FT ADLT (ELECTROSURGICAL) ×3
ELECTRODE REM PT RTRN 9FT ADLT (ELECTROSURGICAL) ×1 IMPLANT
EVACUATOR 1/8 PVC DRAIN (DRAIN) IMPLANT
FACESHIELD WRAPAROUND (MASK) IMPLANT
FACESHIELD WRAPAROUND OR TEAM (MASK) IMPLANT
GAUZE SPONGE 4X4 12PLY STRL (GAUZE/BANDAGES/DRESSINGS) ×3 IMPLANT
GAUZE XEROFORM 5X9 LF (GAUZE/BANDAGES/DRESSINGS) ×3 IMPLANT
GLOVE SKINSENSE NS SZ7.5 (GLOVE) ×4
GLOVE SKINSENSE STRL SZ7.5 (GLOVE) ×2 IMPLANT
GOWN STRL REIN XL XLG (GOWN DISPOSABLE) ×3 IMPLANT
KIT BASIN OR (CUSTOM PROCEDURE TRAY) ×3 IMPLANT
NS IRRIG 1000ML POUR BTL (IV SOLUTION) ×3 IMPLANT
PACK GENERAL/GYN (CUSTOM PROCEDURE TRAY) ×3 IMPLANT
PAD ARMBOARD 7.5X6 YLW CONV (MISCELLANEOUS) ×3 IMPLANT
PAD CAST 4YDX4 CTTN HI CHSV (CAST SUPPLIES) ×4 IMPLANT
PADDING CAST COTTON 4X4 STRL (CAST SUPPLIES) ×6
SPONGE LAP 18X18 X RAY DECT (DISPOSABLE) IMPLANT
STAPLER VISISTAT 35W (STAPLE) IMPLANT
STOCKINETTE IMPERVIOUS LG (DRAPES) ×3 IMPLANT
SUT ETHILON 3 0 PS 1 (SUTURE) ×6 IMPLANT
SUT MON AB 2-0 CT1 36 (SUTURE) ×6 IMPLANT
SUT PDS AB 0 CT 36 (SUTURE) ×6 IMPLANT
SUT SILK 0 TIES 10X30 (SUTURE) ×3 IMPLANT
SUT SILK 2 0 (SUTURE) ×3
SUT SILK 2 0 SH CR/8 (SUTURE) IMPLANT
SUT SILK 2-0 18XBRD TIE 12 (SUTURE) IMPLANT
SUT VIC AB 0 CT1 27 (SUTURE) ×9
SUT VIC AB 0 CT1 27XBRD ANBCTR (SUTURE) ×2 IMPLANT
SUT VIC AB 1 CT1 27 (SUTURE) ×6
SUT VIC AB 1 CT1 27XBRD ANBCTR (SUTURE) ×2 IMPLANT
TOWEL OR 17X26 10 PK STRL BLUE (TOWEL DISPOSABLE) ×9 IMPLANT

## 2016-02-21 NOTE — Anesthesia Preprocedure Evaluation (Addendum)
Anesthesia Evaluation  Patient identified by MRN, date of birth, ID band Patient awake    Reviewed: Allergy & Precautions, NPO status , Patient's Chart, lab work & pertinent test results  History of Anesthesia Complications Negative for: history of anesthetic complications  Airway Mallampati: II  TM Distance: >3 FB Neck ROM: Full    Dental  (+) Dental Advisory Given   Pulmonary neg pulmonary ROS,    breath sounds clear to auscultation       Cardiovascular negative cardio ROS   Rhythm:Regular Rate:Normal  7/16 ECHO: EF 55-60%, small secundum ASD with L-R shunt   Neuro/Psych negative neurological ROS     GI/Hepatic negative GI ROS, Neg liver ROS,   Endo/Other  diabetes, Poorly Controlled, Insulin Dependent  Renal/GU negative Renal ROS     Musculoskeletal   Abdominal   Peds  Hematology  (+) Blood dyscrasia, anemia ,   Anesthesia Other Findings   Reproductive/Obstetrics                            Lab Results  Component Value Date   WBC 6.0 02/21/2016   HGB 11.6* 02/21/2016   HCT 34.5* 02/21/2016   MCV 87.8 02/21/2016   PLT 357 02/21/2016   Lab Results  Component Value Date   CREATININE 0.85 02/21/2016   BUN 26* 02/21/2016   NA 138 02/21/2016   K 3.9 02/21/2016   CL 103 02/21/2016   CO2 26 02/21/2016    Anesthesia Physical  Anesthesia Plan  ASA: III  Anesthesia Plan: General   Post-op Pain Management:    Induction: Intravenous  Airway Management Planned: LMA  Additional Equipment:   Intra-op Plan:   Post-operative Plan:   Informed Consent: I have reviewed the patients History and Physical, chart, labs and discussed the procedure including the risks, benefits and alternatives for the proposed anesthesia with the patient or authorized representative who has indicated his/her understanding and acceptance.   Dental advisory given  Plan Discussed with: CRNA and  Surgeon  Anesthesia Plan Comments: (Plan routine monitors, GA- LMA OK)        Anesthesia Quick Evaluation

## 2016-02-21 NOTE — H&P (Signed)
PREOPERATIVE H&P  Chief Complaint: left tibial osteomyelitis at below knee amputation stump  HPI: [name] is a 50 y.o. female who presents for surgical treatment of left tibial osteomyelitis at below knee amputation stump.  She denies any changes in medical history.  Past Medical History  Diagnosis Date  . Diabetes mellitus   . 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 Janie Morning), Cysto performed by Dr. Jonna Munro 12/2009 with evac of clots and bx and fulguration. // H/O stage 2 SCC of the vulva  . Anemia     2/2 blood loss  . Diabetes mellitus type 2, uncontrolled ([place]) DX: 2001  . Osteomyelitis ([place])     S/P removal 2nd MT head 01/29/11 - CX showing MSSA and GBS  . Diabetic foot ulcers ([place])   . Cataract   . Diabetes mellitus without complication ([place])   . PONV (postoperative nausea and vomiting)    Past Surgical History  Procedure Laterality Date  . Radical wide local excision of the vulva and right nguinal lymph node dissection  10/2003    Dr. Fermin Schwab  . Uterine dilatation and currettage    . Labial mass excision  07/2003    Dr. Ree Edman  . Left second toe mtp joint amputation  12/19/2010    Dr. Mayer Camel  . Irrigation and debridement of left foot with removal of left  01/29/2011    Dr. Mayer Camel  . Tubal ligation    . I&d extremity Left 06/02/2015    Procedure: IRRIGATION AND DEBRIDEMENT EXTREMITY;  Surgeon: Leandrew Koyanagi, MD;  Location: [facility];  Service: Orthopedics;  Laterality: Left;  . Amputation Left 06/02/2015    Procedure: Partial Left  AMPUTATION FOOT;  Surgeon: Leandrew Koyanagi, MD;  Location: [facility];  Service: Orthopedics;  Laterality: Left;  . I&d extremity Left 06/05/2015    Procedure: IRRIGATION AND DEBRIDEMENT LEFT LEG, POSSIBLE CLOSURE BELOW KNEE AMPUTATION;  Surgeon:  Leandrew Koyanagi, MD;  Location: [place];  Service: Orthopedics;  Laterality: Left;  . Amputation Left 06/03/2015    Procedure: Left Transtibial amputation,application of wound vac;  Surgeon: Leandrew Koyanagi, MD;  Location: [facility];  Service: Orthopedics;  Laterality: Left;  . I&d extremity Left 06/08/2015    Procedure: IRRIGATION AND DEBRIDEMENT LEFT LEG, WITH  CLOSURE OF BKA;  Surgeon: Leandrew Koyanagi, MD;  Location: [place];  Service: Orthopedics;  Laterality: Left;  . Tee without cardioversion N/A 06/11/2015    Procedure: TRANSESOPHAGEAL ECHOCARDIOGRAM (TEE);  Surgeon: Lelon Perla, MD;  Location: [facility];  Service: Cardiovascular;  Laterality: N/A;  . Amputation Left 06/26/2015    Procedure: AMPUTATION BELOW KNEE STUMP RECLOSURE ;  Surgeon: Marybelle Killings, MD;  Location: [place];  Service: Orthopedics;  Laterality: Left;  . Stump revision Left 09/03/2015    Procedure: LEFT LEG REVISION AMPUTATION;  Surgeon: Leandrew Koyanagi, MD;  Location: [place];  Service: Orthopedics;  Laterality: Left;  . I&d extremity Left 09/03/2015    Procedure: IRRIGATION AND DEBRIDEMENT LEFT BELOW KNEE AMPUTATION;  Surgeon: Leandrew Koyanagi, MD;  Location: [place];  Service: Orthopedics;  Laterality: Left;   Social History   Social History  . Marital Status: Married    Spouse Name: N/A  . Number of Children: N/A  . Years of Education: N/A  Social History Main Topics  . Smoking status: Never Smoker   . Smokeless tobacco: Never Used  . Alcohol Use: No  . Drug Use: No  . Sexual Activity: Yes   Other Topics Concern  . None   Social History Narrative   ** Merged History Encounter **       History reviewed. No pertinent family history. Allergies  Allergen Reactions  . Cefazolin Rash   Prior to Admission medications   Medication Sig Start Date End Date Taking? Authorizing Provider  glucose blood (TRUE METRIX BLOOD GLUCOSE TEST) test strip USE TO TEST BLOOD SUGAR 3 TIMES DAILY 07/02/15  Yes Deepak Advani, MD  insulin aspart  (NOVOLOG FLEXPEN) 100 UNIT/ML FlexPen Inject 10 Units into the skin 3 (three) times daily with meals. 07/04/15  Yes Deepak Advani, MD  insulin glargine (LANTUS) 100 UNIT/ML injection Inject 30 Units into the skin at bedtime.   Yes Historical Provider, MD  Insulin Pen Needle 32G X 4 MM MISC 1 application by Does not apply route 4 (four) times daily -  with meals and at bedtime. 06/18/15  Yes Ivan Anchors Love, PA-C  Multiple Vitamin (MULTIVITAMIN WITH MINERALS) TABS tablet Take 1 tablet by mouth daily. 06/18/15  Yes Pamela S Love, PA-C  TRUEPLUS LANCETS 28G MISC USE TO TEST BLOOD SUGAR 3 TIMES A DAY 07/02/15  Yes Lorayne Marek, MD  Insulin Glargine (TOUJEO SOLOSTAR) 300 UNIT/ML SOPN Inject 30 Units/day into the skin daily at 10 pm. Patient not taking: Reported on 02/21/2016 12/05/15   Arnoldo Morale, MD     Positive ROS: All other systems have been reviewed and were otherwise negative with the exception of those mentioned in the HPI and as above.  Physical Exam: General: Alert, no acute distress Cardiovascular: No pedal edema Respiratory: No cyanosis, no use of accessory musculature GI: abdomen soft Skin: No lesions in the area of chief complaint Neurologic: Sensation intact distally Psychiatric: Patient is competent for consent with normal mood and affect Lymphatic: no lymphedema  MUSCULOSKELETAL: exam stable  Assessment: left tibial osteomyelitis at below knee amputation stump  Plan: Plan for Procedure(s): LEFT BELOW KNEE AMPUTATION STUMP REVISION  The risks benefits and alternatives were discussed with the patient including but not limited to the risks of nonoperative treatment, versus surgical intervention including infection, bleeding, nerve injury,  blood clots, cardiopulmonary complications, morbidity, mortality, among others, and they were willing to proceed.   Marianna Payment, MD   02/21/2016 8:35 AM

## 2016-02-21 NOTE — Progress Notes (Signed)
Dr. Deatra Canter at bedside at present, will discuss elevated CBG with Dr. Erlinda Hong before deciding whether to proceed with surgery today. Pt verbalized understanding (using interpreter).

## 2016-02-21 NOTE — Anesthesia Procedure Notes (Signed)
Procedure Name: LMA Insertion Date/Time: 02/21/2016 9:49 AM Performed by: Ollen Bowl Pre-anesthesia Checklist: Patient identified, Emergency Drugs available, Suction available, Patient being monitored and Timeout performed Patient Re-evaluated:Patient Re-evaluated prior to inductionOxygen Delivery Method: Circle system utilized and Simple face mask Preoxygenation: Pre-oxygenation with 100% oxygen Intubation Type: IV induction Ventilation: Mask ventilation without difficulty LMA: LMA inserted LMA Size: 4.0 Number of attempts: 1 Airway Equipment and Method: Patient positioned with wedge pillow Placement Confirmation: positive ETCO2 and breath sounds checked- equal and bilateral Tube secured with: Tape Dental Injury: Teeth and Oropharynx as per pre-operative assessment

## 2016-02-21 NOTE — Progress Notes (Signed)
10 units IV novolog given for CBG of 451 per Dr. Deatra Canter.

## 2016-02-21 NOTE — Progress Notes (Signed)
Dr. Erlinda Hong at bedside. Dr. Deatra Canter called and informed of CBG of 162 after 10 units of IV Novolog. Dr. Erlinda Hong also made aware of CBG readings. No new orders received.

## 2016-02-21 NOTE — Op Note (Signed)
    Date of surgery: 02/21/2016  Preoperative diagnosis: Left BKA stump osteomyelitis and abscess  Postoperative diagnosis: Same  Procedure:  1. Revision amputation of left BKA 2. Adjacent tissue rearrangement 10 x 10 cm  Drains: none  Surgeon: Eduard Roux, M.D.  Anesthesia: Gen.  Estimated blood loss: 50 cc  Complications: None  Indication for procedure: The patient presents today for the above-mentioned procedure for the above mentioned condition.  The risks, benefits, and alternatives to surgery were discussed with the patient and/or family and they wish to proceed.  Description of procedure: The patient was identified in the preoperative holding area. The patient was brought back to the operating room. The patient was placed supine on the table. General anesthesia was induced. Nonsterile tourniquet placed on the upper thigh. Timeout was performed. Preoperative antibiotics given.  The old incision and the draining sinus was ellipsed out. Full-thickness flaps were created. We performed sharp excisional debridement of the soft tissue with a rongeur and knife. We then revised amputation by taking the level of the osteotomy about 4 cm proximally. The fibula was also done in the same fashion. We examined the tibia which did not demonstrate any residual osteomyelitis at the level of the osteotomy. The tourniquet was deflated. Hemostasis was obtained. The stump was thoroughly irrigated. I had to perform adjacent tissue rearrangement in order to get the wound closed. This measured about 10 x 10 cm. The deep layer was closed with 0 PDS. The deep skin was closed with 2-0 Monocryl. The skin was closed with 3-0 nylon. Sterile dressings were applied. Patient tolerated procedure well and no immediate complications.  Disposition:  Patient will be nonweightbearing to the operative extremity. The patient will undergo prosthetic fitting once the wound is healed and the swelling has subsided.  [name], MD [place] 10:57 AM

## 2016-02-21 NOTE — Progress Notes (Signed)
Home instructions given to pt and family . Interpretor used.

## 2016-02-21 NOTE — Transfer of Care (Signed)
Immediate Anesthesia Transfer of Care Note  Patient: [name]  Procedure(s) Performed: Procedure(s): LEFT BELOW KNEE AMPUTATION STUMP REVISION (Left)  Patient Location: PACU  Anesthesia Type:General  Level of Consciousness: awake and alert   Airway & Oxygen Therapy: Patient Spontanous Breathing and Patient connected to nasal cannula oxygen  Post-op Assessment: Report given to RN, Post -op Vital signs reviewed and stable and Patient moving all extremities X 4  Post vital signs: Reviewed and stable  Last Vitals:  Filed Vitals:   02/21/16 0751  BP: 103/56  Pulse: 81  Temp: 36.8 C  Resp: 18    Complications: No apparent anesthesia complications

## 2016-02-24 ENCOUNTER — Encounter (HOSPITAL_COMMUNITY): Payer: Self-pay | Admitting: Orthopaedic Surgery

## 2016-02-24 NOTE — Anesthesia Postprocedure Evaluation (Signed)
Anesthesia Post Note  Patient: [name]  Procedure(s) Performed: Procedure(s) (LRB): LEFT BELOW KNEE AMPUTATION STUMP REVISION (Left)  Patient location during evaluation: PACU Anesthesia Type: General Level of consciousness: awake and alert Pain management: pain level controlled Vital Signs Assessment: post-procedure vital signs reviewed and stable Respiratory status: spontaneous breathing, nonlabored ventilation, respiratory function stable and patient connected to nasal cannula oxygen Cardiovascular status: blood pressure returned to baseline and stable Postop Assessment: no signs of nausea or vomiting Anesthetic complications: no    Last Vitals:  Filed Vitals:   02/21/16 1215 02/21/16 1230  BP: 128/71   Pulse: 81 78  Temp: 36.6 C   Resp: 8 8    Last Pain:  Filed Vitals:   02/21/16 1234  PainSc: 4                  Tiajuana Amass

## 2016-05-29 ENCOUNTER — Encounter: Payer: Self-pay | Admitting: Family Medicine

## 2016-05-29 ENCOUNTER — Ambulatory Visit: Payer: Managed Care, Other (non HMO) | Attending: Family Medicine | Admitting: Family Medicine

## 2016-05-29 DIAGNOSIS — Z89512 Acquired absence of left leg below knee: Secondary | ICD-10-CM | POA: Diagnosis not present

## 2016-05-29 DIAGNOSIS — K5909 Other constipation: Secondary | ICD-10-CM

## 2016-05-29 DIAGNOSIS — E1169 Type 2 diabetes mellitus with other specified complication: Secondary | ICD-10-CM

## 2016-05-29 DIAGNOSIS — Z91148 Patient's other noncompliance with medication regimen for other reason: Secondary | ICD-10-CM | POA: Insufficient documentation

## 2016-05-29 DIAGNOSIS — Z9114 Patient's other noncompliance with medication regimen: Secondary | ICD-10-CM | POA: Insufficient documentation

## 2016-05-29 LAB — POCT GLYCOSYLATED HEMOGLOBIN (HGB A1C): HEMOGLOBIN A1C: 12.5

## 2016-05-29 LAB — GLUCOSE, POCT (MANUAL RESULT ENTRY): POC GLUCOSE: 231 mg/dL — AB (ref 70–99)

## 2016-05-29 MED ORDER — INSULIN GLARGINE 100 UNIT/ML SOLOSTAR PEN: 30.0000 [IU] | PEN_INJECTOR | Freq: Every day | SUBCUTANEOUS | Status: DC

## 2016-05-29 MED ORDER — TRUEPLUS LANCETS 28G MISC: Status: DC

## 2016-05-29 MED ORDER — LACTULOSE 10 GM/15ML PO SOLN: 10.0000 g | Freq: Two times a day (BID) | ORAL | Status: DC | PRN

## 2016-05-29 MED ORDER — INSULIN ASPART 100 UNIT/ML FLEXPEN: 10.0000 [IU] | PEN_INJECTOR | Freq: Three times a day (TID) | SUBCUTANEOUS | Status: DC

## 2016-05-29 MED ORDER — INSULIN PEN NEEDLE 32G X 4 MM MISC: 1.0000 "application " | Freq: Three times a day (TID) | Status: DC

## 2016-05-29 NOTE — Progress Notes (Signed)
Subjective:  Patient ID: [name], female    DOB: January 26, 1966  Age: 50 y.o. MRN: [id number]  CC: Diabetes   HPI [name] is a 50 year old female with history of type 2 diabetes mellitus (A1c 12.5), noncompliance, left BKA who comes into the clinic for a follow-up visit.  She was last seen in the clinic 7  months ago and has been out of all her medications for the last 1 month. She has not been compliant with diabetic diet either. Denies hypoglycemia, has no numbness in the lower extremities, is not up-to-date on eye exams.  Complains of constipation and  would like to have some medication to relieve this.  History of vaginal cancer in 2004  Past Medical History  Diagnosis Date  . Diabetes mellitus   . 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 Janie Morning), Cysto performed by Dr. Jonna Munro 12/2009 with evac of clots and bx and fulguration. // H/O stage 2 SCC of the vulva  . Anemia     2/2 blood loss  . Diabetes mellitus type 2, uncontrolled ([place]) DX: 2001  . Osteomyelitis ([place])     S/P removal 2nd MT head 01/29/11 - CX showing MSSA and GBS  . Diabetic foot ulcers ([place])   . Cataract   . Diabetes mellitus without complication ([place])   . PONV (postoperative nausea and vomiting)      Outpatient Prescriptions Prior to Visit  Medication Sig Dispense Refill  . glucose blood (TRUE METRIX BLOOD GLUCOSE TEST) test strip USE TO TEST BLOOD SUGAR 3 TIMES DAILY (Patient not taking: Reported on 05/29/2016) 100 each 12  . Multiple Vitamin (MULTIVITAMIN WITH MINERALS) TABS tablet Take 1 tablet by mouth daily. (Patient not taking: Reported on 05/29/2016) 30 tablet 1  . insulin aspart (NOVOLOG FLEXPEN) 100 UNIT/ML FlexPen Inject 10 Units into the skin 3 (three) times daily with meals.  (Patient not taking: Reported on 05/29/2016) 15 mL 1  . insulin glargine (LANTUS) 100 UNIT/ML injection Inject 30 Units into the skin at bedtime. Reported on 05/29/2016    . Insulin Glargine (TOUJEO SOLOSTAR) 300 UNIT/ML SOPN Inject 30 Units/day into the skin daily at 10 pm. (Patient not taking: Reported on 02/21/2016) 1 pen 11  . Insulin Pen Needle 32G X 4 MM MISC 1 application by Does not apply route 4 (four) times daily -  with meals and at bedtime. (Patient not taking: Reported on 05/29/2016) 100 each 1  . oxyCODONE-acetaminophen (PERCOCET) 5-325 MG tablet Take 1-2 tablets by mouth every 4 (four) hours as needed for severe pain. (Patient not taking: Reported on 05/29/2016) 90 tablet 0  . sulfamethoxazole-trimethoprim (BACTRIM DS,SEPTRA DS) 800-160 MG tablet Take 1 tablet by mouth 2 (two) times daily. (Patient not taking: Reported on 05/29/2016) 64 tablet 0  . TRUEPLUS LANCETS 28G MISC USE TO TEST BLOOD SUGAR 3 TIMES A DAY (Patient not taking: Reported on 05/29/2016) 100 each 12   No facility-administered medications prior to visit.    ROS Review of Systems  Constitutional: Negative for activity change, appetite change and fatigue.  HENT: Negative for congestion, sinus pressure and sore throat.   Eyes: Negative for visual disturbance.  Respiratory: Negative for cough, chest tightness, shortness of breath and wheezing.   Cardiovascular: Negative for chest pain and palpitations.  Gastrointestinal: Negative for abdominal pain, constipation  and abdominal distention.  Endocrine: Negative for polydipsia.  Genitourinary: Negative for dysuria and frequency.  Musculoskeletal:       Left BKA  Skin: Negative for rash.  Neurological: Negative for tremors, light-headedness and numbness.  Hematological: Does not bruise/bleed easily.  Psychiatric/Behavioral: Negative for behavioral problems and agitation.    Objective:  BP 139/75 mmHg  Pulse 91  Temp(Src) 99 F (37.2 C) (Oral)  Resp 18  Ht 5' (1.524  m)  Wt 130 lb (58.968 kg)  BMI 25.39 kg/m2  SpO2 94%  LMP 05/12/2013  BP/Weight 05/29/2016 02/21/2016 123456  Systolic BP [id number] 123456  Diastolic BP 75 71 67  Wt. (Lbs) 130 130 -  BMI 25.39 25.39 -      Physical Exam Constitutional: Patient appears well-developed and well-nourished. No distress. Neck: Normal ROM. Neck supple. No JVD. No tracheal deviation. No thyromegaly. CVS: RRR, S1/S2 +, no murmurs, no gallops, no carotid bruit.  Pulmonary: Effort and breath sounds normal, no stridor, rhonchi, wheezes, rales.  Abdominal: Soft. BS +,  no distension, tenderness, rebound or guarding.  Musculoskeletal: left BKA, with stump Lymphadenopathy: No lymphadenopathy noted, cervical, inguinal or axillary Neuro: Alert. Normal reflexes, muscle tone coordination. No cranial nerve deficit. Skin: Skin is warm and dry. Psychiatric: Normal mood and affect. Behavior, judgment, thought content normal.  Lab Results  Component Value Date   HGBA1C 12.5 05/29/2016    CMP Latest Ref Rng 02/21/2016 10/16/2015 10/09/2015  Glucose 65 - 99 mg/dL 328(H) 105(H) 298(H)  BUN 6 - 20 mg/dL 26(H) 18 21  Creatinine 0.44 - 1.00 mg/dL 0.85 0.60 0.59  Sodium 135 - 145 mmol/L 138 140 138  Potassium 3.5 - 5.1 mmol/L 3.9 4.4 4.7  Chloride 101 - 111 mmol/L 103 103 102  CO2 22 - 32 mmol/L 26 29 30   Calcium 8.9 - 10.3 mg/dL 9.4 8.7 8.9  Total Protein 6.1 - 8.1 g/dL - 6.6 6.9  Total Bilirubin 0.2 - 1.2 mg/dL - 0.3 0.3  Alkaline Phos 33 - 130 U/L - 97 140(H)  AST 10 - 35 U/L - 16 15  ALT 6 - 29 U/L - 4(L) 7      Assessment & Plan:   1. Type 2 diabetes mellitus with other specified complication (HCC) [id number] due to noncompliance Compliance emphasized and medications refilled We'll review blood sugar log at her next visit - Glucose (CBG) - HgB A1c - insulin aspart (NOVOLOG FLEXPEN) 100 UNIT/ML FlexPen; Inject 10 Units into the skin 3 (three) times daily with meals.  Dispense: 15 mL; Refill: 1 - TRUEPLUS  LANCETS 28G MISC; USE TO TEST BLOOD SUGAR 3 TIMES A DAY  Dispense: 100 each; Refill: 12 - Insulin Pen Needle 32G X 4 MM MISC; 1 application by Does not apply route 4 (four) times daily -  with meals and at bedtime.  Dispense: 100 each; Refill: 1 - Microalbumin / creatinine urine ratio; Future - COMPLETE METABOLIC PANEL WITH GFR; Future - Lipid panel; Future - Insulin Glargine (LANTUS SOLOSTAR) 100 UNIT/ML Solostar Pen; Inject 30 Units into the skin daily at 10 pm.  Dispense: 5 pen; Refill: 3  2. Non compliance w medication regimen Discussed implications of noncompliance with the patient She is nonchalant about this.  3. S/P BKA (below knee amputation) unilateral, left (HCC)   4. Other constipation Dietary modification. - lactulose (CHRONULAC) 10 GM/15ML solution; Take 15 mLs (10 g total) by mouth 2 (two) times daily as needed for mild constipation.  Dispense: 946 mL; Refill:  2  5. HCM History of vaginal cancer in 2004 Will need surveillance Pap smears  Meds ordered this encounter  Medications  . insulin aspart (NOVOLOG FLEXPEN) 100 UNIT/ML FlexPen    Sig: Inject 10 Units into the skin 3 (three) times daily with meals.    Dispense:  15 mL    Refill:  1  . TRUEPLUS LANCETS 28G MISC    Sig: USE TO TEST BLOOD SUGAR 3 TIMES A DAY    Dispense:  100 each    Refill:  12  . Insulin Pen Needle 32G X 4 MM MISC    Sig: 1 application by Does not apply route 4 (four) times daily -  with meals and at bedtime.    Dispense:  100 each    Refill:  1  . Insulin Glargine (LANTUS SOLOSTAR) 100 UNIT/ML Solostar Pen    Sig: Inject 30 Units into the skin daily at 10 pm.    Dispense:  5 pen    Refill:  3  . lactulose (CHRONULAC) 10 GM/15ML solution    Sig: Take 15 mLs (10 g total) by mouth 2 (two) times daily as needed for mild constipation.    Dispense:  946 mL    Refill:  2    Follow-up: Return in about 2 weeks (around 06/12/2016) for follow up of Diabetes.   Arnoldo Morale MD

## 2016-05-29 NOTE — Patient Instructions (Signed)
Diabetes Mellitus and Food It is important for you to manage your blood sugar (glucose) level. Your blood glucose level can be greatly affected by what you eat. Eating healthier foods in the appropriate amounts throughout the day at about the same time each day will help you control your blood glucose level. It can also help slow or prevent worsening of your diabetes mellitus. Healthy eating may even help you improve the level of your blood pressure and reach or maintain a healthy weight.  General recommendations for healthful eating and cooking habits include:  Eating meals and snacks regularly. Avoid going long periods of time without eating to lose weight.  Eating a diet that consists mainly of plant-based foods, such as fruits, vegetables, nuts, legumes, and whole grains.  Using low-heat cooking methods, such as baking, instead of high-heat cooking methods, such as deep frying. Work with your dietitian to make sure you understand how to use the Nutrition Facts information on food labels. HOW CAN FOOD AFFECT ME? Carbohydrates Carbohydrates affect your blood glucose level more than any other type of food. Your dietitian will help you determine how many carbohydrates to eat at each meal and teach you how to count carbohydrates. Counting carbohydrates is important to keep your blood glucose at a healthy level, especially if you are using insulin or taking certain medicines for diabetes mellitus. Alcohol Alcohol can cause sudden decreases in blood glucose (hypoglycemia), especially if you use insulin or take certain medicines for diabetes mellitus. Hypoglycemia can be a life-threatening condition. Symptoms of hypoglycemia (sleepiness, dizziness, and disorientation) are similar to symptoms of having too much alcohol.  If your health care provider has given you approval to drink alcohol, do so in moderation and use the following guidelines:  Women should not have more than one drink per day, and men  should not have more than two drinks per day. One drink is equal to:  12 oz of beer.  5 oz of wine.  1 oz of hard liquor.  Do not drink on an empty stomach.  Keep yourself hydrated. Have water, diet soda, or unsweetened iced tea.  Regular soda, juice, and other mixers might contain a lot of carbohydrates and should be counted. WHAT FOODS ARE NOT RECOMMENDED? As you make food choices, it is important to remember that all foods are not the same. Some foods have fewer nutrients per serving than other foods, even though they might have the same number of calories or carbohydrates. It is difficult to get your body what it needs when you eat foods with fewer nutrients. Examples of foods that you should avoid that are high in calories and carbohydrates but low in nutrients include:  Trans fats (most processed foods list trans fats on the Nutrition Facts label).  Regular soda.  Juice.  Candy.  Sweets, such as cake, pie, doughnuts, and cookies.  Fried foods. WHAT FOODS CAN I EAT? Eat nutrient-rich foods, which will nourish your body and keep you healthy. The food you should eat also will depend on several factors, including:  The calories you need.  The medicines you take.  Your weight.  Your blood glucose level.  Your blood pressure level.  Your cholesterol level. You should eat a variety of foods, including:  Protein.  Lean cuts of meat.  Proteins low in saturated fats, such as fish, egg whites, and beans. Avoid processed meats.  Fruits and vegetables.  Fruits and vegetables that may help control blood glucose levels, such as apples, mangoes, and   yams.  Dairy products.  Choose fat-free or low-fat dairy products, such as milk, yogurt, and cheese.  Grains, bread, pasta, and rice.  Choose whole grain products, such as multigrain bread, whole oats, and brown rice. These foods may help control blood pressure.  Fats.  Foods containing healthful fats, such as nuts,  avocado, olive oil, canola oil, and fish. DOES EVERYONE WITH DIABETES MELLITUS HAVE THE SAME MEAL PLAN? Because every person with diabetes mellitus is different, there is not one meal plan that works for everyone. It is very important that you meet with a dietitian who will help you create a meal plan that is just right for you.   This information is not intended to replace advice given to you by your health care provider. Make sure you discuss any questions you have with your health care provider.   Document Released: 08/13/2005 Document Revised: 12/07/2014 Document Reviewed: 10/13/2013 Elsevier Interactive Patient Education 2016 Elsevier Inc.  

## 2016-05-29 NOTE — Progress Notes (Signed)
Diabetes Medication refills Has no diabetic supplies, does have the meter

## 2016-06-03 ENCOUNTER — Other Ambulatory Visit: Payer: Self-pay

## 2016-06-15 ENCOUNTER — Ambulatory Visit: Payer: Self-pay | Admitting: Family Medicine

## 2016-08-04 ENCOUNTER — Ambulatory Visit: Payer: Managed Care, Other (non HMO) | Attending: Orthopedic Surgery | Admitting: Physical Therapy

## 2016-08-05 ENCOUNTER — Encounter (HOSPITAL_COMMUNITY): Payer: Self-pay | Admitting: Student-PharmD

## 2016-11-16 ENCOUNTER — Other Ambulatory Visit: Payer: Self-pay | Admitting: Family Medicine

## 2016-11-16 DIAGNOSIS — E1169 Type 2 diabetes mellitus with other specified complication: Secondary | ICD-10-CM

## 2016-12-14 ENCOUNTER — Other Ambulatory Visit: Payer: Self-pay | Admitting: Family Medicine

## 2016-12-14 DIAGNOSIS — E1169 Type 2 diabetes mellitus with other specified complication: Secondary | ICD-10-CM

## 2019-07-17 ENCOUNTER — Telehealth: Payer: Self-pay | Admitting: Gastroenterology

## 2019-07-17 NOTE — Telephone Encounter (Signed)
° °¿  Tiene o ha tenido [facility] ltimos 14 das?          Do you now or have you had a fever in the last 14 days?  Tiene algn sntoma respiratorio de dificultad para respirar o tos ahora o en los ltimos [address]?         Do you have any respiratory symptoms of shortness of breath or cough now or in the last 14 days?         Tiene algn familiar o contacto cercano con Covid-19 diagnosticado o sospechoso en los ltimos [address]         Do you have any family members or close contacts with diagnosed or suspected Covid-19 in the past 14 days?      Te han hecho la prueba de Covid-19 y has encontrado que es positivo?       Have you been tested for Covid-19 and found to be positive? Se le pidi a la paciente que trajera a alguien que la ayudara a interpretar durante su visita de [employer] forces operational officer. Tanto la intrprete como ella misma deben ingresar al edificio con Judene Companion. Patient to be asked to bring someone to help interpreter for her. Both interpreter and herself should enter the building wearing a mask.

## 2019-07-18 ENCOUNTER — Other Ambulatory Visit: Payer: Self-pay

## 2019-07-18 ENCOUNTER — Encounter: Payer: Self-pay | Admitting: Gastroenterology

## 2019-07-18 ENCOUNTER — Ambulatory Visit (INDEPENDENT_AMBULATORY_CARE_PROVIDER_SITE_OTHER): Payer: Managed Care, Other (non HMO) | Admitting: Gastroenterology

## 2019-07-18 VITALS — BP 124/82 | HR 80 | Temp 98.2°F | Ht 63.0 in | Wt 175.1 lb

## 2019-07-18 DIAGNOSIS — Z789 Other specified health status: Secondary | ICD-10-CM

## 2019-07-18 NOTE — Progress Notes (Signed)
    This visit was opened by mistake for another patient by the staff.  Mistake was realized and the appointment canceled.  Dr Lyndel Safe

## 2021-02-07 ENCOUNTER — Other Ambulatory Visit: Payer: Self-pay | Admitting: Hematology and Oncology

## 2021-02-07 LAB — CBC AND DIFFERENTIAL
HCT: 32 — AB (ref 36–46)
Hemoglobin: 10.4 — AB (ref 12.0–16.0)
Neutrophils Absolute: 6.08
Platelets: 265 (ref 150–399)
WBC: 9.5

## 2021-02-07 LAB — BASIC METABOLIC PANEL
BUN: 14 (ref 4–21)
CO2: 22 (ref 13–22)
Chloride: 107 (ref 99–108)
Creatinine: 0.6 (ref 0.5–1.1)
Glucose: 96
Potassium: 3.9 (ref 3.4–5.3)
Sodium: 136 — AB (ref 137–147)

## 2021-02-07 LAB — HEPATIC FUNCTION PANEL
ALT: 18 (ref 7–35)
AST: 28 (ref 13–35)
Alkaline Phosphatase: 79 (ref 25–125)
Bilirubin, Total: 0.2

## 2021-02-07 LAB — COMPREHENSIVE METABOLIC PANEL
Albumin: 4.4 (ref 3.5–5.0)
Calcium: 8.8 (ref 8.7–10.7)

## 2021-02-07 LAB — CBC
MCV: 77 — AB (ref 81–99)
RBC: 4.2 (ref 3.87–5.11)
